# Patient Record
Sex: Male | Born: 1945 | Hispanic: No | Marital: Married | Smoking: Former smoker
Health system: Southern US, Community
[De-identification: ages and names within clinical notes are randomized; demographics above are authoritative.]

## PROBLEM LIST (undated history)

## (undated) DIAGNOSIS — I2089 Other forms of angina pectoris: Secondary | ICD-10-CM

## (undated) DIAGNOSIS — L821 Other seborrheic keratosis: Secondary | ICD-10-CM

## (undated) DIAGNOSIS — M869 Osteomyelitis, unspecified: Secondary | ICD-10-CM

## (undated) DIAGNOSIS — E11628 Type 2 diabetes mellitus with other skin complications: Secondary | ICD-10-CM

## (undated) DIAGNOSIS — G5793 Unspecified mononeuropathy of bilateral lower limbs: Secondary | ICD-10-CM

## (undated) DIAGNOSIS — R0981 Nasal congestion: Secondary | ICD-10-CM

## (undated) DIAGNOSIS — D125 Benign neoplasm of sigmoid colon: Secondary | ICD-10-CM

## (undated) DIAGNOSIS — G894 Chronic pain syndrome: Secondary | ICD-10-CM

## (undated) DIAGNOSIS — N529 Male erectile dysfunction, unspecified: Secondary | ICD-10-CM

## (undated) DIAGNOSIS — R0600 Dyspnea, unspecified: Secondary | ICD-10-CM

## (undated) DIAGNOSIS — M1990 Unspecified osteoarthritis, unspecified site: Secondary | ICD-10-CM

## (undated) DIAGNOSIS — E119 Type 2 diabetes mellitus without complications: Secondary | ICD-10-CM

## (undated) DIAGNOSIS — I6523 Occlusion and stenosis of bilateral carotid arteries: Secondary | ICD-10-CM

## (undated) DIAGNOSIS — L03119 Cellulitis of unspecified part of limb: Secondary | ICD-10-CM

## (undated) DIAGNOSIS — J449 Chronic obstructive pulmonary disease, unspecified: Secondary | ICD-10-CM

## (undated) DIAGNOSIS — Z89422 Acquired absence of other left toe(s): Secondary | ICD-10-CM

## (undated) DIAGNOSIS — Z972 Presence of dental prosthetic device (complete) (partial): Secondary | ICD-10-CM

## (undated) DIAGNOSIS — R519 Headache, unspecified: Secondary | ICD-10-CM

## (undated) DIAGNOSIS — R600 Localized edema: Secondary | ICD-10-CM

## (undated) DIAGNOSIS — M109 Gout, unspecified: Secondary | ICD-10-CM

## (undated) DIAGNOSIS — I429 Cardiomyopathy, unspecified: Secondary | ICD-10-CM

## (undated) DIAGNOSIS — I2510 Atherosclerotic heart disease of native coronary artery without angina pectoris: Secondary | ICD-10-CM

## (undated) DIAGNOSIS — K219 Gastro-esophageal reflux disease without esophagitis: Secondary | ICD-10-CM

## (undated) DIAGNOSIS — E785 Hyperlipidemia, unspecified: Secondary | ICD-10-CM

## (undated) DIAGNOSIS — I10 Essential (primary) hypertension: Secondary | ICD-10-CM

## (undated) DIAGNOSIS — R51 Headache: Secondary | ICD-10-CM

## (undated) DIAGNOSIS — R42 Dizziness and giddiness: Secondary | ICD-10-CM

## (undated) DIAGNOSIS — I214 Non-ST elevation (NSTEMI) myocardial infarction: Secondary | ICD-10-CM

## (undated) DIAGNOSIS — Z951 Presence of aortocoronary bypass graft: Secondary | ICD-10-CM

## (undated) DIAGNOSIS — I509 Heart failure, unspecified: Secondary | ICD-10-CM

## (undated) HISTORY — DX: Gout, unspecified: M10.9

## (undated) HISTORY — DX: Heart failure, unspecified: I50.9

## (undated) HISTORY — DX: Chronic pain syndrome: G89.4

## (undated) HISTORY — DX: Male erectile dysfunction, unspecified: N52.9

## (undated) HISTORY — DX: Type 2 diabetes mellitus without complications: E11.9

## (undated) HISTORY — DX: Hyperlipidemia, unspecified: E78.5

## (undated) HISTORY — PX: KNEE SURGERY: SHX244

## (undated) HISTORY — DX: Essential (primary) hypertension: I10

## (undated) HISTORY — DX: Other seborrheic keratosis: L82.1

## (undated) HISTORY — DX: Unspecified osteoarthritis, unspecified site: M19.90

## (undated) HISTORY — PX: CORONARY ARTERY BYPASS GRAFT: SHX141

---

## 1978-12-18 DIAGNOSIS — I509 Heart failure, unspecified: Secondary | ICD-10-CM

## 1978-12-18 HISTORY — DX: Heart failure, unspecified: I50.9

## 2004-09-22 ENCOUNTER — Ambulatory Visit: Payer: Self-pay | Admitting: Physician Assistant

## 2004-11-08 ENCOUNTER — Ambulatory Visit: Payer: Self-pay | Admitting: Physician Assistant

## 2004-11-25 ENCOUNTER — Ambulatory Visit: Payer: Self-pay | Admitting: Gastroenterology

## 2004-12-07 ENCOUNTER — Ambulatory Visit: Payer: Self-pay | Admitting: Physician Assistant

## 2007-12-10 ENCOUNTER — Ambulatory Visit: Payer: Self-pay | Admitting: Gastroenterology

## 2009-02-05 ENCOUNTER — Ambulatory Visit: Payer: Self-pay | Admitting: Cardiology

## 2009-02-05 ENCOUNTER — Ambulatory Visit: Payer: Self-pay | Admitting: General Practice

## 2009-02-18 ENCOUNTER — Ambulatory Visit: Payer: Self-pay | Admitting: General Practice

## 2009-12-21 ENCOUNTER — Ambulatory Visit: Payer: Self-pay | Admitting: General Practice

## 2009-12-24 ENCOUNTER — Ambulatory Visit: Payer: Self-pay | Admitting: General Practice

## 2010-10-27 ENCOUNTER — Ambulatory Visit: Payer: Self-pay | Admitting: Gastroenterology

## 2010-10-30 LAB — PATHOLOGY REPORT

## 2010-12-18 HISTORY — PX: ROTATOR CUFF REPAIR: SHX139

## 2011-02-07 ENCOUNTER — Ambulatory Visit: Payer: Self-pay | Admitting: Unknown Physician Specialty

## 2011-02-17 ENCOUNTER — Ambulatory Visit: Payer: Self-pay | Admitting: Unknown Physician Specialty

## 2013-08-07 ENCOUNTER — Ambulatory Visit: Payer: Self-pay | Admitting: Internal Medicine

## 2014-03-06 ENCOUNTER — Emergency Department: Payer: Self-pay | Admitting: Emergency Medicine

## 2014-03-06 LAB — CBC
HCT: 39.2 % — ABNORMAL LOW (ref 40.0–52.0)
HGB: 13.4 g/dL (ref 13.0–18.0)
MCH: 29.7 pg (ref 26.0–34.0)
MCHC: 34.1 g/dL (ref 32.0–36.0)
MCV: 87 fL (ref 80–100)
Platelet: 198 10*3/uL (ref 150–440)
RBC: 4.5 10*6/uL (ref 4.40–5.90)
RDW: 16.8 % — ABNORMAL HIGH (ref 11.5–14.5)
WBC: 6.2 10*3/uL (ref 3.8–10.6)

## 2014-03-06 LAB — TROPONIN I: Troponin-I: <0.02 ng/mL

## 2014-03-06 LAB — URINALYSIS, COMPLETE
Bacteria: NONE SEEN
Bilirubin,UR: NEGATIVE
Blood: NEGATIVE
Glucose,UR: NEGATIVE mg/dL (ref 0–75)
Ketone: NEGATIVE
Leukocyte Esterase: NEGATIVE
Nitrite: NEGATIVE
Ph: 6 (ref 4.5–8.0)
Protein: NEGATIVE
RBC,UR: NONE SEEN /HPF (ref 0–5)
Specific Gravity: 1.002 (ref 1.003–1.030)
Squamous Epithelial: NONE SEEN
WBC UR: NONE SEEN /HPF (ref 0–5)

## 2014-03-06 LAB — COMPREHENSIVE METABOLIC PANEL
Albumin: 3.8 g/dL (ref 3.4–5.0)
Alkaline Phosphatase: 79 U/L
Anion Gap: 2 — ABNORMAL LOW (ref 7–16)
BUN: 8 mg/dL (ref 7–18)
Bilirubin,Total: 0.4 mg/dL (ref 0.2–1.0)
Calcium, Total: 8.6 mg/dL (ref 8.5–10.1)
Chloride: 106 mmol/L (ref 98–107)
Co2: 29 mmol/L (ref 21–32)
Creatinine: 0.96 mg/dL (ref 0.60–1.30)
EGFR (African American): >60
EGFR (Non-African Amer.): >60
Glucose: 139 mg/dL — ABNORMAL HIGH (ref 65–99)
Osmolality: 274 (ref 275–301)
Potassium: 3.8 mmol/L (ref 3.5–5.1)
SGOT(AST): 21 U/L (ref 15–37)
SGPT (ALT): 26 U/L (ref 12–78)
Sodium: 137 mmol/L (ref 136–145)
Total Protein: 7.6 g/dL (ref 6.4–8.2)

## 2014-10-16 DIAGNOSIS — E119 Type 2 diabetes mellitus without complications: Secondary | ICD-10-CM | POA: Insufficient documentation

## 2014-10-16 DIAGNOSIS — I2510 Atherosclerotic heart disease of native coronary artery without angina pectoris: Secondary | ICD-10-CM | POA: Insufficient documentation

## 2014-10-16 DIAGNOSIS — E782 Mixed hyperlipidemia: Secondary | ICD-10-CM | POA: Insufficient documentation

## 2014-10-26 DIAGNOSIS — I6523 Occlusion and stenosis of bilateral carotid arteries: Secondary | ICD-10-CM | POA: Insufficient documentation

## 2014-10-26 DIAGNOSIS — I6529 Occlusion and stenosis of unspecified carotid artery: Secondary | ICD-10-CM | POA: Insufficient documentation

## 2015-02-02 DIAGNOSIS — R0681 Apnea, not elsewhere classified: Secondary | ICD-10-CM | POA: Insufficient documentation

## 2015-06-02 DIAGNOSIS — I10 Essential (primary) hypertension: Secondary | ICD-10-CM | POA: Insufficient documentation

## 2015-06-18 ENCOUNTER — Ambulatory Visit: Payer: Self-pay | Admitting: Urology

## 2015-07-02 ENCOUNTER — Encounter: Payer: Self-pay | Admitting: Urology

## 2015-07-02 ENCOUNTER — Ambulatory Visit (INDEPENDENT_AMBULATORY_CARE_PROVIDER_SITE_OTHER): Payer: Medicare HMO | Admitting: Urology

## 2015-07-02 VITALS — BP 128/64 | HR 74 | Ht 73.0 in | Wt 203.0 lb

## 2015-07-02 DIAGNOSIS — N529 Male erectile dysfunction, unspecified: Secondary | ICD-10-CM | POA: Diagnosis not present

## 2015-07-02 MED ORDER — ALPROSTADIL (VASODILATOR) 20 MCG IC KIT: 20.0000 ug | PACK | INTRACAVERNOUS | Status: DC | PRN

## 2015-07-02 NOTE — Progress Notes (Signed)
07/02/2015 11:32 AM   [place] 04/19/1946 [id number]  Referring provider: No referring provider defined for this encounter.  Chief Complaint  Patient presents with  . Erectile Dysfunction    New Patient    HPI: 69 yo M with longstanding ED who presents today for further evaluation.  He reports an approximately 10 year history of difficulty achieving and maintaining an erection adequate for penetration. He is able to ejaculate rarely in the absence of erection.  His interest in sexual activity is intact.  No history of penile curvature, penile pain, or penile trauma.  He was seen and evaluated for this approximately 3 years ago by ? [facility] ([place]/ [place]).  He tried all kinds of tabs (Viagra, Levitra, Cialis) but they were no longer effective.  He did have an injection in the office for injection which was effective but he was too anxious about needles to perform these injections at home.  He is unsure of the name of the medication and dose. He does think now that he is mentally prepared to resume with intracavernosal injection therapy.  He has DM, HTN, HL and a history of MI s/p cardiac .  He sees Dr. Drema Dallas (cardiology) and saw him just this week.   He is currently Imdur.    He reports that he gets annual digital rectal exams/PSA screening by his PCP.      SHIM      07/04/15 1124       SHIM: Over the last 6 months:   How do you rate your confidence that you could get and keep an erection? Very Low     When you had erections with sexual stimulation, how often were your erections hard enough for penetration (entering your partner)? Almost Never or Never     During sexual intercourse, how often were you able to maintain your erection after you had penetrated (entered) your partner? Extremely Difficult     During sexual intercourse, how difficult was it to maintain your erection to completion of intercourse? Extremely Difficult     When you attempted sexual  intercourse, how often was it satisfactory for you? Extremely Difficult     SHIM Total Score   SHIM 5         PMH: Past Medical History  Diagnosis Date  . ED (erectile dysfunction)   . Seborrheic keratosis   . Chronic pain syndrome   . Diabetes   . Gout   . Congestive heart failure   . Hyperlipemia   . Hypertension   . Arthritis     Surgical History: Past Surgical History  Procedure Laterality Date  . Rotator cuff repair Right 2012  . Knee surgery Right     over 20 years ago    Home Medications:    Medication List       This list is accurate as of: 07/02/15 11:59 PM.  Always use your most recent med list.               alprostadil 20 MCG injection  Commonly known as:  EDEX  20 mcg by Intracavitary route as needed for erectile dysfunction. use no more than 3 times per week     amLODipine 10 MG tablet  Commonly known as:  NORVASC  Take 10 mg by mouth daily.     aspirin 81 MG tablet  Take 81 mg by mouth daily.     atorvastatin 80 MG tablet  Commonly known as:  LIPITOR  Take 80 mg by mouth daily.     gabapentin 800 MG tablet  Commonly known as:  NEURONTIN  Take 1,600 mg by mouth 3 (three) times daily.     isosorbide dinitrate 30 MG tablet  Commonly known as:  ISORDIL  Take 30 mg by mouth 2 (two) times daily.     lisinopril 20 MG tablet  Commonly known as:  PRINIVIL,ZESTRIL  Take 20 mg by mouth daily.     metFORMIN 500 MG 24 hr tablet  Commonly known as:  GLUCOPHAGE-XR  500 mg 2 (two) times daily.     omeprazole 20 MG capsule  Commonly known as:  PRILOSEC  Take 20 mg by mouth daily.     oxyCODONE-acetaminophen 10-325 MG per tablet  Commonly known as:  PERCOCET  Take 1 tablet by mouth every 4 (four) hours as needed for pain.     pioglitazone 15 MG tablet  Commonly known as:  ACTOS  Take 15 mg by mouth daily.        Allergies:  Allergies  Allergen Reactions  . Cortisone Acetate [Cortisone] Rash    Cortisone injection    Family  History: Family History  Problem Relation Age of Onset  . Cancer Father   . Heart disease Father   . Cancer Mother   . Heart disease Brother   . Diabetes Brother   . Diabetes Sister     Social History:  reports that he quit smoking about 35 years ago. He does not have any smokeless tobacco history on file. He reports that he does not drink alcohol or use illicit drugs.   Review of Systems UROLOGY Frequent Urination?: No Hard to postpone urination?: No Burning/pain with urination?: No Get up at night to urinate?: Yes Leakage of urine?: No Urine stream starts and stops?: Yes Trouble starting stream?: No Do you have to strain to urinate?: No Blood in urine?: No Urinary tract infection?: No Sexually transmitted disease?: No Injury to kidneys or bladder?: No Painful intercourse?: No Weak stream?: No Erection problems?: No Penile pain?: No Gastrointestinal Nausea?: No Vomiting?: No Indigestion/heartburn?: No Diarrhea?: No Constipation?: No Constitutional Fever: No Night sweats?: No Weight loss?: No Fatigue?: No Skin Skin rash/lesions?: No Itching?: No Eyes Blurred vision?: No Double vision?: No Ears/Nose/Throat Sore throat?: No Sinus problems?: No Hematologic/Lymphatic Swollen glands?: No Easy bruising?: No Cardiovascular Leg swelling?: No Chest pain?: No Respiratory Cough?: No Shortness of breath?: No Endocrine Excessive thirst?: No Musculoskeletal Back pain?: No Joint pain?: No Neurological Headaches?: No Dizziness?: No Psychologic Depression?: No Anxiety?: No   Physical Exam: BP 128/64 mmHg  Pulse 74  Ht 6\' 1"  (1.854 m)  Wt 203 lb (92.08 kg)  BMI 26.79 kg/m2  Constitutional:  Alert and oriented, No acute distress. HEENT: [place] AT, moist mucus membranes.  Trachea midline, no masses. Cardiovascular: No clubbing, cyanosis, or edema. Respiratory: Normal respiratory effort, no increased work of breathing. GI: Abdomen is soft, nontender,  nondistended, no abdominal masses GU: No CVA tenderness. Circumcised without any evidence of penile plaques or abnormalities. Orthotopic patent meatus. Unremarkable scrotum with bilateral descended testicles, no masses. Skin: No rashes, bruises or suspicious lesions. Lymph: No cervical or inguinal adenopathy. Neurologic: Grossly intact, no focal deficits, moving all 4 extremities. Psychiatric: Normal mood and affect.  Laboratory Data: Lab Results  Component Value Date   WBC 6.2 03/06/2014   HGB 13.4 03/06/2014   HCT 39.2* 03/06/2014   MCV 87 03/06/2014   PLT 198 03/06/2014    Lab Results  Component Value Date   CREATININE 0.96 03/06/2014    Pertinent Imaging: n/a  Assessment & Plan:  69 year old male with long-standing, refractory erectile dysfunction, likely multifactorial in the setting of hypertension, hyperlipidemia, diabetes, and coronary artery disease. Given his Imdur, PD 5 inhibitors are contraindicated. He discussed today possibly proceeding with intracavernosal injections as previously and the patient is very interested in this. We discussed the risks of ICI including the risk of penile pain, scarring, and priapism. He is willing to proceed as planned.  1. Erectile dysfunction, unspecified erectile dysfunction type Caverject prescribed today, will fill prescription and bring to the follow-up appointment for testing doses.   Return in about 2 weeks (around 07/16/2015) for injection teaching (shannon or dr. Erlene Quan).  Hollice Espy, MD  [facility] [address], [place],  [address] [phone]

## 2015-07-02 NOTE — Patient Instructions (Signed)
Erectile Dysfunction Erectile dysfunction is the inability to get or sustain a good enough erection to have sexual intercourse. Erectile dysfunction may involve:  Inability to get an erection.  Lack of enough hardness to allow penetration.  Loss of the erection before sex is finished.  Premature ejaculation. CAUSES  Certain drugs, such as:  Pain relievers.  Antihistamines.  Antidepressants.  Blood pressure medicines.  Water pills (diuretics).  Ulcer medicines.  Muscle relaxants.  Illegal drugs.  Excessive drinking.  Psychological causes, such as:  Anxiety.  Depression.  Sadness.  Exhaustion.  Performance fear.  Stress.  Physical causes, such as:  Artery problems. This may include diabetes, smoking, liver disease, or atherosclerosis.  High blood pressure.  Hormonal problems, such as low testosterone.  Obesity.  Nerve problems. This may include back or pelvic injuries, diabetes mellitus, multiple sclerosis, or Parkinson disease. SYMPTOMS  Inability to get an erection.  Lack of enough hardness to allow penetration.  Loss of the erection before sex is finished.  Premature ejaculation.  Normal erections at some times, but with frequent unsatisfactory episodes.  Orgasms that are not satisfactory in sensation or frequency.  Low sexual satisfaction in either partner because of erection problems.  A curved penis occurring with erection. The curve may cause pain or may be too curved to allow for intercourse.  Never having nighttime erections. DIAGNOSIS Your caregiver can often diagnose this condition by:  Performing a physical exam to find other diseases or specific problems with the penis.  Asking you detailed questions about the problem.  Performing blood tests to check for diabetes mellitus or to measure hormone levels.  Performing urine tests to find other underlying health conditions.  Performing an ultrasound exam to check for  scarring.  Performing a test to check blood flow to the penis.  Doing a sleep study at home to measure nighttime erections. TREATMENT   You may be prescribed medicines by mouth.  You may be given medicine injections into the penis.  You may be prescribed a vacuum pump with a ring.  Penile implant surgery may be performed. You may receive:  An inflatable implant.  A semirigid implant.  Blood vessel surgery may be performed. HOME CARE INSTRUCTIONS  If you are prescribed oral medicine, you should take the medicine as prescribed. Do not increase the dosage without first discussing it with your physician.  If you are using self-injections, be careful to avoid any veins that are on the surface of the penis. Apply pressure to the injection site for 5 minutes.  If you are using a vacuum pump, make sure you have read the instructions before using it. Discuss any questions with your physician before taking the pump home. SEEK MEDICAL CARE IF:  You experience pain that is not responsive to the pain medicine you have been prescribed.  You experience nausea or vomiting. SEEK IMMEDIATE MEDICAL CARE IF:   When taking oral or injectable medications, you experience an erection that lasts longer than 4 hours. If your physician is unavailable, go to the nearest emergency room for evaluation. An erection that lasts much longer than 4 hours can result in permanent damage to your penis.  You have pain that is severe.  You develop redness, severe pain, or severe swelling of your penis.  You have redness spreading up into your groin or lower abdomen.  You are unable to pass your urine. Document Released: 12/01/2000 Document Revised: 08/06/2013 Document Reviewed: 05/08/2013 [facility] Patient Information 2015 [place], [place]. This information is not  intended to replace advice given to you by your health care provider. Make sure you discuss any questions you have with your health care provider.

## 2015-07-04 ENCOUNTER — Encounter: Payer: Self-pay | Admitting: Urology

## 2015-07-09 ENCOUNTER — Telehealth: Payer: Self-pay | Admitting: Urology

## 2015-07-09 NOTE — Telephone Encounter (Signed)
Mr. Belger called saying the Rx for Edex is too expensive for him to fill. [employer] told him they sent information to [facility] in regards to the medication, the cost, and his insurance but haven't received a response. He's wondering if someone at [facility] will call [employer] to discuss this so he can fill the Rx and have it mailed to him. Pt ph# [phone] Thank you.

## 2015-07-12 ENCOUNTER — Telehealth: Payer: Self-pay

## 2015-07-12 NOTE — Telephone Encounter (Signed)
Insurance will not cover edex. Pt is going to call insurance company to get a list of medications insurance company will cover.

## 2015-07-16 ENCOUNTER — Ambulatory Visit: Payer: Medicare HMO | Admitting: Urology

## 2015-07-20 ENCOUNTER — Encounter: Payer: Self-pay | Admitting: Urology

## 2015-07-20 ENCOUNTER — Ambulatory Visit (INDEPENDENT_AMBULATORY_CARE_PROVIDER_SITE_OTHER): Payer: Medicare HMO | Admitting: Urology

## 2015-07-20 VITALS — BP 132/69 | HR 93 | Ht 73.0 in | Wt 201.8 lb

## 2015-07-20 DIAGNOSIS — N529 Male erectile dysfunction, unspecified: Secondary | ICD-10-CM

## 2015-07-20 NOTE — Progress Notes (Signed)
Trimix - Papaverine 30mg  Phentolamine 0.5mg  Prostaglandin 10 mcg Proscription called in to [facility] for 5 syringes trimix

## 2015-07-25 NOTE — Progress Notes (Signed)
1:20 PM   [place] [birth year] [id number]  Referring provider: Ellamae Sia, MD [place], [place] [address]  Chief Complaint  Patient presents with  . Erectile Dysfunction    pt comes in for ED injection (Trimix) instruction    HPI: 70 yo M with longstanding ED presents today for test dose Trimix teaching and administration.    He reports an approximately 10 year history of difficulty achieving and maintaining an erection adequate for penetration. He is able to ejaculate rarely in the absence of erection.  His interest in sexual activity is intact.  No history of penile curvature, penile pain, or penile trauma.  He was seen and evaluated for this approximately 3 years ago by ? [facility] ([place]/ [place]).  He tried all kinds of tabs (Viagra, Levitra, Cialis) but they were no longer effective.  He did have an injection in the office for injection which was effective but he was too anxious about needles to perform these injections at home.  He is unsure of the name of the medication and dose. He does think now that he is mentally prepared to resume with intracavernosal injection therapy.  He has DM, HTN, HL and a history of MI s/p cardiac .  He sees Dr. Drema Dallas (cardiology) and saw him just this week.   He is currently Imdur.    He reports that he gets annual digital rectal exams/PSA screening by his PCP.      SHIM      07/04/15 1124       SHIM: Over the last 6 months:   How do you rate your confidence that you could get and keep an erection? Very Low     When you had erections with sexual stimulation, how often were your erections hard enough for penetration (entering your partner)? Almost Never or Never     During sexual intercourse, how often were you able to maintain your erection after you had penetrated (entered) your partner? Extremely Difficult     During sexual intercourse, how difficult was it to maintain your erection to completion of  intercourse? Extremely Difficult     When you attempted sexual intercourse, how often was it satisfactory for you? Extremely Difficult     SHIM Total Score   SHIM 5         PMH: Past Medical History  Diagnosis Date  . ED (erectile dysfunction)   . Seborrheic keratosis   . Chronic pain syndrome   . Diabetes   . Gout   . Congestive heart failure   . Hyperlipemia   . Hypertension   . Arthritis     Surgical History: Past Surgical History  Procedure Laterality Date  . Rotator cuff repair Right 2012  . Knee surgery Right     over 20 years ago    Home Medications:    Medication List       This list is accurate as of: 07/20/15 11:59 PM.  Always use your most recent med list.               alprostadil 20 MCG injection  Commonly known as:  EDEX  20 mcg by Intracavitary route as needed for erectile dysfunction. use no more than 3 times per week     amLODipine 10 MG tablet  Commonly known as:  NORVASC  Take 10 mg by mouth daily.     aspirin 81 MG tablet  Take 81 mg by mouth daily.  atorvastatin 80 MG tablet  Commonly known as:  LIPITOR  Take 80 mg by mouth daily.     gabapentin 800 MG tablet  Commonly known as:  NEURONTIN  Take 1,600 mg by mouth 3 (three) times daily.     isosorbide dinitrate 30 MG tablet  Commonly known as:  ISORDIL  Take 30 mg by mouth 2 (two) times daily.     lisinopril 20 MG tablet  Commonly known as:  PRINIVIL,ZESTRIL  Take 20 mg by mouth daily.     metFORMIN 500 MG 24 hr tablet  Commonly known as:  GLUCOPHAGE-XR  500 mg 2 (two) times daily.     omeprazole 20 MG capsule  Commonly known as:  PRILOSEC  Take 20 mg by mouth daily.     oxyCODONE-acetaminophen 10-325 MG per tablet  Commonly known as:  PERCOCET  Take 1 tablet by mouth every 4 (four) hours as needed for pain.     pioglitazone 15 MG tablet  Commonly known as:  ACTOS  Take 15 mg by mouth daily.        Allergies:  Allergies  Allergen Reactions  . Cortisone  Acetate [Cortisone] Rash    Cortisone injection    Family History: Family History  Problem Relation Age of Onset  . Cancer Father   . Heart disease Father   . Cancer Mother   . Heart disease Brother   . Diabetes Brother   . Diabetes Sister     Social History:  reports that he quit smoking about 35 years ago. He does not have any smokeless tobacco history on file. He reports that he does not drink alcohol or use illicit drugs.   Physical Exam: BP 132/69 mmHg  Pulse 93  Ht 6\' 1"  (1.854 m)  Wt 201 lb 12.8 oz (91.536 kg)  BMI 26.63 kg/m2  Constitutional:  Alert and oriented, No acute distress. HEENT: [place] AT, moist mucus membranes.  Trachea midline, no masses. Cardiovascular: No clubbing, cyanosis, or edema. Respiratory: Normal respiratory effort, no increased work of breathing. GI: Abdomen is soft, nontender, nondistended, no abdominal masses GU: No CVA tenderness. Circumcised without any evidence of penile plaques or abnormalities. Orthotopic patent meatus. Unremarkable scrotum with bilateral descended testicles, no masses. Skin: No rashes, bruises or suspicious lesions. Neurologic: Grossly intact, no focal deficits, moving all 4 extremities. Psychiatric: Normal mood and affect.  Laboratory Data: Lab Results  Component Value Date   WBC 6.2 03/06/2014   HGB 13.4 03/06/2014   HCT 39.2* 03/06/2014   MCV 87 03/06/2014   PLT 198 03/06/2014    Lab Results  Component Value Date   CREATININE 0.96 03/06/2014    Procedure: Phallus placed on stretch. Lateral aspect of right midshaft prepped with alcohol pad. Prefilled insulin needle of 0.5 mL of tri-mix consisting of the following formulation were administered intracavernosally.  Patient experienced a partial, proximally 60% erection within 5 minutes of administration.    Trimix -  Papaverine 30mg   Phentolamine 0.5mg   Prostaglandin 10 mcg     Assessment & Plan:  68 year old male with long-standing, refractory erectile  dysfunction, likely multifactorial in the setting of hypertension, hyperlipidemia, diabetes, and coronary artery disease. Given his Imdur, PD 5 inhibitors are contraindicated.   1. Erectile dysfunction, unspecified erectile dysfunction type Test dose of Trymex administered today with fairly good success. Suspect better success at home with stimulation. Steps to self injections were reviewed in detail. The patient feels comfortable with this.  He was given strict precautions to return  if his erection does not fully resolve within 4 hours or becomes painful. Additional refills called in to pharmacy today.  Return in about 6 months (around 01/20/2016) for ED f/u.  Hollice Espy, MD  [facility] [address] [place], [place] [address] [phone]

## 2015-09-20 ENCOUNTER — Telehealth: Payer: Self-pay

## 2015-09-20 ENCOUNTER — Other Ambulatory Visit: Payer: Self-pay

## 2015-09-20 NOTE — Telephone Encounter (Signed)
Gastroenterology Pre-Procedure Review  Request Date: 10/25/15 Requesting Physician: Dr. Ellin Goodie  PATIENT REVIEW QUESTIONS: The patient responded to the following health history questions as indicated:    1. Are you having any GI issues? no 2. Do you have a personal history of Polyps? yes (2011) 3. Do you have a family history of Colon Cancer or Polyps? no 4. Diabetes Mellitus? no 5. Joint replacements in the past 12 months?no 6. Major health problems in the past 3 months?no 7. Any artificial heart valves, MVP, or defibrillator?no    MEDICATIONS & ALLERGIES:    Patient reports the following regarding taking any anticoagulation/antiplatelet therapy:   Plavix, Coumadin, Eliquis, Xarelto, Lovenox, Pradaxa, Brilinta, or Effient? no Aspirin? yes (ASA 81mg )  Patient confirms/reports the following medications:  Current Outpatient Prescriptions  Medication Sig Dispense Refill  . alprostadil (EDEX) 20 MCG injection 20 mcg by Intracavitary route as needed for erectile dysfunction. use no more than 3 times per week (Patient not taking: Reported on 07/20/2015) 1 each 0  . amLODipine (NORVASC) 10 MG tablet Take 10 mg by mouth daily.    Marland Kitchen aspirin 81 MG tablet Take 81 mg by mouth daily.    Marland Kitchen atorvastatin (LIPITOR) 80 MG tablet Take 80 mg by mouth daily.    Marland Kitchen gabapentin (NEURONTIN) 800 MG tablet Take 1,600 mg by mouth 3 (three) times daily.    . isosorbide dinitrate (ISORDIL) 30 MG tablet Take 30 mg by mouth 2 (two) times daily.    Marland Kitchen lisinopril (PRINIVIL,ZESTRIL) 20 MG tablet Take 20 mg by mouth daily.    . metFORMIN (GLUCOPHAGE-XR) 500 MG 24 hr tablet 500 mg 2 (two) times daily.     Marland Kitchen omeprazole (PRILOSEC) 20 MG capsule Take 20 mg by mouth daily.    Marland Kitchen oxyCODONE-acetaminophen (PERCOCET) 10-325 MG per tablet Take 1 tablet by mouth every 4 (four) hours as needed for pain.    . pioglitazone (ACTOS) 15 MG tablet Take 15 mg by mouth daily.     No current facility-administered medications for this  visit.    Patient confirms/reports the following allergies:  Allergies  Allergen Reactions  . Cortisone Acetate [Cortisone] Rash    Cortisone injection    No orders of the defined types were placed in this encounter.    AUTHORIZATION INFORMATION Primary Insurance: 1D#: Group #:  Secondary Insurance: 1D#: Group #:  SCHEDULE INFORMATION: Date: 10/25/15 Time: Location: [place]

## 2015-09-22 ENCOUNTER — Telehealth: Payer: Self-pay

## 2015-09-22 NOTE — Telephone Encounter (Signed)
-----   Message from Kenneth Duarte sent at 06/07/2015  2:29 PM EDT ----- Regarding: triage Sent: 09/20/2015  call for triage. Pt due in Nov 2016. Last colon done by Dr. Candace Cruise on 10-27-2010. GF

## 2015-09-22 NOTE — Telephone Encounter (Signed)
Pt scheduled for colonoscopy on 10-25-15. Instructs/rx mailed.

## 2015-10-22 NOTE — Discharge Instructions (Signed)

## 2015-10-25 ENCOUNTER — Encounter
Admission: RE | Disposition: A | Discharge status: Home / Self Care | Payer: Self-pay | Source: Ambulatory Visit | Attending: Gastroenterology

## 2015-10-25 ENCOUNTER — Ambulatory Visit: Payer: Medicare HMO | Admitting: Anesthesiology

## 2015-10-25 ENCOUNTER — Other Ambulatory Visit: Payer: Self-pay | Admitting: Gastroenterology

## 2015-10-25 ENCOUNTER — Ambulatory Visit
Admission: RE | Admit: 2015-10-25 | Discharge: 2015-10-25 | Disposition: A | Discharge status: Home / Self Care | Payer: Medicare HMO | Source: Ambulatory Visit | Attending: Gastroenterology | Admitting: Gastroenterology

## 2015-10-25 DIAGNOSIS — I509 Heart failure, unspecified: Secondary | ICD-10-CM | POA: Insufficient documentation

## 2015-10-25 DIAGNOSIS — Z79899 Other long term (current) drug therapy: Secondary | ICD-10-CM | POA: Diagnosis not present

## 2015-10-25 DIAGNOSIS — L821 Other seborrheic keratosis: Secondary | ICD-10-CM | POA: Insufficient documentation

## 2015-10-25 DIAGNOSIS — E1142 Type 2 diabetes mellitus with diabetic polyneuropathy: Secondary | ICD-10-CM | POA: Insufficient documentation

## 2015-10-25 DIAGNOSIS — Z7984 Long term (current) use of oral hypoglycemic drugs: Secondary | ICD-10-CM | POA: Insufficient documentation

## 2015-10-25 DIAGNOSIS — M109 Gout, unspecified: Secondary | ICD-10-CM | POA: Insufficient documentation

## 2015-10-25 DIAGNOSIS — I10 Essential (primary) hypertension: Secondary | ICD-10-CM | POA: Diagnosis not present

## 2015-10-25 DIAGNOSIS — Z860100 Personal history of colon polyps, unspecified: Secondary | ICD-10-CM | POA: Insufficient documentation

## 2015-10-25 DIAGNOSIS — E785 Hyperlipidemia, unspecified: Secondary | ICD-10-CM | POA: Diagnosis not present

## 2015-10-25 DIAGNOSIS — Z87891 Personal history of nicotine dependence: Secondary | ICD-10-CM | POA: Insufficient documentation

## 2015-10-25 DIAGNOSIS — Z7982 Long term (current) use of aspirin: Secondary | ICD-10-CM | POA: Insufficient documentation

## 2015-10-25 DIAGNOSIS — M1990 Unspecified osteoarthritis, unspecified site: Secondary | ICD-10-CM | POA: Diagnosis not present

## 2015-10-25 DIAGNOSIS — K641 Second degree hemorrhoids: Secondary | ICD-10-CM | POA: Diagnosis not present

## 2015-10-25 DIAGNOSIS — G894 Chronic pain syndrome: Secondary | ICD-10-CM | POA: Diagnosis not present

## 2015-10-25 DIAGNOSIS — D125 Benign neoplasm of sigmoid colon: Secondary | ICD-10-CM

## 2015-10-25 HISTORY — PX: COLONOSCOPY WITH PROPOFOL: SHX5780

## 2015-10-25 HISTORY — DX: Nasal congestion: R09.81

## 2015-10-25 HISTORY — PX: POLYPECTOMY: SHX5525

## 2015-10-25 HISTORY — DX: Unspecified mononeuropathy of bilateral lower limbs: G57.93

## 2015-10-25 HISTORY — DX: Presence of dental prosthetic device (complete) (partial): Z97.2

## 2015-10-25 LAB — GLUCOSE, CAPILLARY
Glucose-Capillary: 121 mg/dL — ABNORMAL HIGH (ref 65–99)
Glucose-Capillary: 123 mg/dL — ABNORMAL HIGH (ref 65–99)

## 2015-10-25 SURGERY — COLONOSCOPY WITH PROPOFOL
Anesthesia: Monitor Anesthesia Care | Wound class: Contaminated

## 2015-10-25 MED ORDER — LIDOCAINE HCL (CARDIAC) 20 MG/ML IV SOLN
INTRAVENOUS | Status: DC | PRN
  Administered 2015-10-25: 50 mg via INTRAVENOUS

## 2015-10-25 MED ORDER — LACTATED RINGERS IV SOLN
INTRAVENOUS | Status: DC
  Administered 2015-10-25: 08:00:00 via INTRAVENOUS

## 2015-10-25 MED ORDER — PROPOFOL 10 MG/ML IV BOLUS
INTRAVENOUS | Status: DC | PRN
  Administered 2015-10-25 (×3): 30 mg via INTRAVENOUS
  Administered 2015-10-25: 140 mg via INTRAVENOUS
  Administered 2015-10-25: 30 mg via INTRAVENOUS

## 2015-10-25 MED ORDER — SODIUM CHLORIDE 0.9 % IV SOLN: INTRAVENOUS | Status: DC

## 2015-10-25 MED ORDER — STERILE WATER FOR IRRIGATION IR SOLN
Status: DC | PRN
  Administered 2015-10-25: 08:00:00

## 2015-10-25 SURGICAL SUPPLY — 28 items
CANISTER SUCT 1200ML W/VALVE (MISCELLANEOUS) ×4 IMPLANT
FCP ESCP3.2XJMB 240X2.8X (MISCELLANEOUS)
FORCEPS BIOP RAD 4 LRG CAP 4 (CUTTING FORCEPS) IMPLANT
FORCEPS BIOP RJ4 240 W/NDL (MISCELLANEOUS)
FORCEPS ESCP3.2XJMB 240X2.8X (MISCELLANEOUS) IMPLANT
GOWN CVR UNV OPN BCK APRN NK (MISCELLANEOUS) ×4 IMPLANT
GOWN ISOL THUMB LOOP REG UNIV (MISCELLANEOUS) ×4
HEMOCLIP INSTINCT (CLIP) IMPLANT
INJECTOR VARIJECT VIN23 (MISCELLANEOUS) IMPLANT
KIT CO2 TUBING (TUBING) IMPLANT
KIT DEFENDO VALVE AND CONN (KITS) IMPLANT
KIT ENDO PROCEDURE OLY (KITS) ×4 IMPLANT
LIGATOR MULTIBAND 6SHOOTER MBL (MISCELLANEOUS) IMPLANT
MARKER SPOT ENDO TATTOO 5ML (MISCELLANEOUS) IMPLANT
PAD GROUND ADULT SPLIT (MISCELLANEOUS) IMPLANT
SNARE SHORT THROW 13M SML OVAL (MISCELLANEOUS) ×4 IMPLANT
SNARE SHORT THROW 30M LRG OVAL (MISCELLANEOUS) IMPLANT
SPOT EX ENDOSCOPIC TATTOO (MISCELLANEOUS)
SUCTION POLY TRAP 4CHAMBER (MISCELLANEOUS) IMPLANT
TRAP SUCTION POLY (MISCELLANEOUS) ×4 IMPLANT
TUBING CONN 6MMX3.1M (TUBING)
TUBING SUCTION CONN 0.25 STRL (TUBING) IMPLANT
UNDERPAD 30X60 958B10 (PK) (MISCELLANEOUS) IMPLANT
VALVE BIOPSY ENDO (VALVE) IMPLANT
VARIJECT INJECTOR VIN23 (MISCELLANEOUS)
WATER AUXILLARY (MISCELLANEOUS) IMPLANT
WATER STERILE IRR 250ML POUR (IV SOLUTION) ×4 IMPLANT
WATER STERILE IRR 500ML POUR (IV SOLUTION) IMPLANT

## 2015-10-25 NOTE — Anesthesia Preprocedure Evaluation (Signed)
Anesthesia Evaluation  Patient identified by MRN, date of birth, ID band Patient awake    Airway Mallampati: II  TM Distance: >3 FB Neck ROM: Full    Dental  (+) Upper Dentures, Lower Dentures   Pulmonary former smoker,           Cardiovascular hypertension, Pt. on medications      Neuro/Psych    GI/Hepatic GERD  Medicated and Controlled,  Endo/Other  diabetes, Well Controlled, Type 2  Renal/GU      Musculoskeletal   Abdominal   Peds  Hematology   Anesthesia Other Findings   Reproductive/Obstetrics                             Anesthesia Physical Anesthesia Plan  ASA: II  Anesthesia Plan: MAC   Post-op Pain Management:    Induction: Intravenous  Airway Management Planned:   Additional Equipment:   Intra-op Plan:   Post-operative Plan:   Informed Consent: I have reviewed the patients History and Physical, chart, labs and discussed the procedure including the risks, benefits and alternatives for the proposed anesthesia with the patient or authorized representative who has indicated his/her understanding and acceptance.     Plan Discussed with: CRNA  Anesthesia Plan Comments:         Anesthesia Quick Evaluation

## 2015-10-25 NOTE — Op Note (Signed)
[facility]astroenterology Patient Name: Kenneth Duarte Procedure Date: 10/25/2015 7:41 AM MRN: [id number] Account #: [id number] Date of Birth: 08-28-1946 Admit Type: Outpatient Age: 69 Room: [facility] OR ROOM 01 Gender: Male Note Status: Finalized Procedure:         Colonoscopy Indications:       High risk colon cancer surveillance: Personal history of                     colonic polyps Providers:         Lucilla Lame, MD Referring MD:      Ellin Goodie, MD (Referring MD) Medicines:         Propofol per Anesthesia Complications:     No immediate complications. Procedure:         Pre-Anesthesia Assessment:                    - Prior to the procedure, a History and Physical was                     performed, and patient medications and allergies were                     reviewed. The patient's tolerance of previous anesthesia                     was also reviewed. The risks and benefits of the procedure                     and the sedation options and risks were discussed with the                     patient. All questions were answered, and informed consent                     was obtained. Prior Anticoagulants: The patient has taken                     no previous anticoagulant or antiplatelet agents. ASA                     Grade Assessment: II - A patient with mild systemic                     disease. After reviewing the risks and benefits, the                     patient was deemed in satisfactory condition to undergo                     the procedure.                    After obtaining informed consent, the colonoscope was                     passed under direct vision. Throughout the procedure, the                     patient's blood pressure, pulse, and oxygen saturations                     were monitored continuously. The was introduced through  the anus and advanced to the the cecum, identified by                     appendiceal orifice  and ileocecal valve. The colonoscopy                     was performed without difficulty. The patient tolerated                     the procedure well. The quality of the bowel preparation                     was excellent. Findings:      The perianal and digital rectal examinations were normal.      A 7 mm polyp was found in the sigmoid colon. The polyp was sessile. The       polyp was removed with a cold snare. Resection and retrieval were       complete.      Non-bleeding internal hemorrhoids were found during retroflexion. The       hemorrhoids were Grade II (internal hemorrhoids that prolapse but reduce       spontaneously). Impression:        - One 7 mm polyp in the sigmoid colon. Resected and                     retrieved.                    - Non-bleeding internal hemorrhoids. Recommendation:    - Await pathology results.                    - Repeat colonoscopy in 5 years for surveillance. Procedure Code(s): --- Professional ---                    [phone], Colonoscopy, flexible; with removal of tumor(s),                     polyp(s), or other lesion(s) by snare technique Diagnosis Code(s): --- Professional ---                    Z86.010, Personal history of colonic polyps                    D12.5, Benign neoplasm of sigmoid colon CPT copyright 2014 American Medical Association. All rights reserved. The codes documented in this report are preliminary and upon coder review may  be revised to meet current compliance requirements. Lucilla Lame, MD 10/25/2015 8:09:33 AM This report has been signed electronically. Number of Addenda: 0 Note Initiated On: 10/25/2015 7:41 AM Scope Withdrawal Time: 0 hours 13 minutes 1 second  Total Procedure Duration: 0 hours 16 minutes 25 seconds       [facility]

## 2015-10-25 NOTE — Anesthesia Procedure Notes (Signed)
Procedure Name: MAC Performed by: Ayako Tapanes Pre-anesthesia Checklist: Patient identified, Emergency Drugs available, Suction available, Timeout performed and Patient being monitored Patient Re-evaluated:Patient Re-evaluated prior to inductionOxygen Delivery Method: Nasal cannula Placement Confirmation: positive ETCO2     

## 2015-10-25 NOTE — Transfer of Care (Signed)
Immediate Anesthesia Transfer of Care Note  Patient: Kenneth Duarte  Procedure(s) Performed: Procedure(s) with comments: COLONOSCOPY WITH PROPOFOL (N/A) - DIABETIC-ORAL MEDS POLYPECTOMY  Patient Location: PACU  Anesthesia Type: MAC  Level of Consciousness: awake, alert  and patient cooperative  Airway and Oxygen Therapy: Patient Spontanous Breathing and Patient connected to supplemental oxygen  Post-op Assessment: Post-op Vital signs reviewed, Patient's Cardiovascular Status Stable, Respiratory Function Stable, Patent Airway and No signs of Nausea or vomiting  Post-op Vital Signs: Reviewed and stable  Complications: No apparent anesthesia complications

## 2015-10-25 NOTE — Anesthesia Postprocedure Evaluation (Signed)
  Anesthesia Post-op Note  Patient: Kenneth Duarte  Procedure(s) Performed: Procedure(s) with comments: COLONOSCOPY WITH PROPOFOL (N/A) - DIABETIC-ORAL MEDS POLYPECTOMY  Anesthesia type:MAC  Patient location: PACU  Post pain: Pain level controlled  Post assessment: Post-op Vital signs reviewed, Patient's Cardiovascular Status Stable, Respiratory Function Stable, Patent Airway and No signs of Nausea or vomiting  Post vital signs: Reviewed and stable  Last Vitals:  Filed Vitals:   10/25/15 0814  BP: 96/61  Pulse: 90  Temp:   Resp: 20    Level of consciousness: awake, alert  and patient cooperative  Complications: No apparent anesthesia complications

## 2015-10-25 NOTE — H&P (Signed)
[facility]  [address], [place], [place] [address] Phone: [phone] Fax : [phone]  Primary Care Physician:  Ellamae Sia, MD Primary Gastroenterologist:  Dr. Allen Norris  Pre-Procedure History & Physical: HPI:  Kenneth Duarte is a 69 y.o. male is here for an colonoscopy.   Past Medical History  Diagnosis Date  . ED (erectile dysfunction)   . Seborrheic keratosis   . Chronic pain syndrome   . Gout   . Hyperlipemia   . Arthritis   . Hypertension     CONTROLLED ON MEDS  . Congestive heart failure ([place]) 1980  . Sinus congestion   . Neuropathy of both feet ([place])   . Diabetes ([place])     DIET  . Wears dentures     PARTIAL UPPER    Past Surgical History  Procedure Laterality Date  . Rotator cuff repair Right 2012  . Knee surgery Right     over 20 years ago    Prior to Admission medications   Medication Sig Start Date End Date Taking? Authorizing Provider  amLODipine (NORVASC) 10 MG tablet Take 10 mg by mouth daily. AM   Yes Historical Provider, MD  aspirin 81 MG tablet Take 81 mg by mouth daily. AM   Yes Historical Provider, MD  atorvastatin (LIPITOR) 80 MG tablet Take 80 mg by mouth daily. AM   Yes Historical Provider, MD  gabapentin (NEURONTIN) 800 MG tablet Take 1,600 mg by mouth 3 (three) times daily.   Yes Historical Provider, MD  isosorbide dinitrate (ISORDIL) 30 MG tablet Take 30 mg by mouth 2 (two) times daily.   Yes Historical Provider, MD  lisinopril (PRINIVIL,ZESTRIL) 20 MG tablet Take 20 mg by mouth daily. AFTERNOON   Yes Historical Provider, MD  metFORMIN (GLUCOPHAGE-XR) 500 MG 24 hr tablet 500 mg 2 (two) times daily.  05/31/15  Yes Historical Provider, MD  oxyCODONE-acetaminophen (PERCOCET) 10-325 MG per tablet Take 2 tablets by mouth every 4 (four) hours as needed for pain.    Yes Historical Provider, MD  pioglitazone (ACTOS) 15 MG tablet Take 15 mg by mouth daily. PM   Yes Historical Provider, MD  alprostadil (EDEX) 20 MCG  injection 20 mcg by Intracavitary route as needed for erectile dysfunction. use no more than 3 times per week Patient not taking: Reported on 07/20/2015 07/02/15   Hollice Espy, MD  omeprazole (PRILOSEC) 20 MG capsule Take 20 mg by mouth daily.    Historical Provider, MD    Allergies as of 09/20/2015 - Review Complete 09/20/2015  Allergen Reaction Noted  . Cortisone acetate [cortisone] Rash 06/30/2015    Family History  Problem Relation Age of Onset  . Cancer Father   . Heart disease Father   . Cancer Mother   . Heart disease Brother   . Diabetes Brother   . Diabetes Sister     Social History   Social History  . Marital Status: Married    Spouse Name: N/A  . Number of Children: N/A  . Years of Education: N/A   Occupational History  . Not on file.   Social History Main Topics  . Smoking status: Former Smoker -- 2.00 packs/day for 15 years    Types: Cigarettes    Quit date: 06/29/1980  . Smokeless tobacco: Never Used  . Alcohol Use: No     Comment: QUIT IN 1980  . Drug Use: No  . Sexual Activity: Not on file   Other Topics Concern  . Not on  file   Social History Narrative    Review of Systems: See HPI, otherwise negative ROS  Physical Exam: BP 151/75 mmHg  Pulse 87  Temp(Src) 97.9 F (36.6 C)  Resp 16  Ht 5\' 11"  (1.803 m)  Wt 188 lb (85.276 kg)  BMI 26.23 kg/m2  SpO2 98% General:   Alert,  pleasant and cooperative in NAD Head:  Normocephalic and atraumatic. Neck:  Supple; no masses or thyromegaly. Lungs:  Clear throughout to auscultation.    Heart:  Regular rate and rhythm. Abdomen:  Soft, nontender and nondistended. Normal bowel sounds, without guarding, and without rebound.   Neurologic:  Alert and  oriented x4;  grossly normal neurologically.  Impression/Plan: Kenneth Duarte is here for an colonoscopy to be performed for history of colon polyps  Risks, benefits, limitations, and alternatives regarding  colonoscopy have been reviewed with the  patient.  Questions have been answered.  All parties agreeable.   Ollen Bowl, MD  10/25/2015, 7:24 AM

## 2015-10-26 ENCOUNTER — Encounter: Payer: Self-pay | Admitting: Gastroenterology

## 2016-01-21 ENCOUNTER — Encounter: Payer: Self-pay | Admitting: Urology

## 2016-01-21 ENCOUNTER — Ambulatory Visit (INDEPENDENT_AMBULATORY_CARE_PROVIDER_SITE_OTHER): Payer: Medicare HMO | Admitting: Urology

## 2016-01-21 VITALS — BP 146/69 | HR 85 | Ht 71.0 in | Wt 206.8 lb

## 2016-01-21 DIAGNOSIS — N529 Male erectile dysfunction, unspecified: Secondary | ICD-10-CM

## 2016-01-21 NOTE — Progress Notes (Signed)
3:44 PM  01/22/2016   [place] 1946/05/29 [id number]  Referring provider: Ellamae Sia, MD [place], [place] [address]  Chief Complaint  Patient presents with  . Erectile Dysfunction    35month    HPI: 70yo M with longstanding ED on Trimix.  1- ED   He reports an approximately 10 year history of difficulty achieving and maintaining an erection adequate for penetration. He is able to ejaculate rarely in the absence of erection.  His interest in sexual activity is intact.  No history of penile curvature, penile pain, or penile trauma.    He has DM, HTN, HL and a history of MI s/p cardiac .  He sees Dr. Drema Dallas (cardiology) and saw him just this week.   He is currently Imdur.    Prescribed Trimix 07/2016 using 0.5 mL since last visit.  His erections are fairly decent (60% rigid) and good enough for penetration but could be better.  He would also prefer that they last a little longer.  No associated penile pain or curvature.      2- Prostate cancer screening He reports that he gets annual digital rectal exams/PSA screening by his PCP.      SHIM      01/21/16 0842       SHIM: Over the last 6 months:   How do you rate your confidence that you could get and keep an erection? Low     When you had erections with sexual stimulation, how often were your erections hard enough for penetration (entering your partner)? A Few Times (much less than half the time)     During sexual intercourse, how often were you able to maintain your erection after you had penetrated (entered) your partner? Very Difficult     During sexual intercourse, how difficult was it to maintain your erection to completion of intercourse? Difficult     When you attempted sexual intercourse, how often was it satisfactory for you? Difficult     SHIM Total Score   SHIM 12         PMH: Past Medical History  Diagnosis Date  . ED (erectile dysfunction)   . Seborrheic keratosis   .  Chronic pain syndrome   . Gout   . Hyperlipemia   . Arthritis   . Hypertension     CONTROLLED ON MEDS  . Congestive heart failure ([place]) 1980  . Sinus congestion   . Neuropathy of both feet ([place])   . Diabetes ([place])     DIET  . Wears dentures     PARTIAL UPPER    Surgical History: Past Surgical History  Procedure Laterality Date  . Rotator cuff repair Right 2012  . Knee surgery Right     over 20 years ago  . Colonoscopy with propofol N/A 10/25/2015    Procedure: COLONOSCOPY WITH PROPOFOL;  Surgeon: Lucilla Lame, MD;  Location: [facility];  Service: Endoscopy;  Laterality: N/A;  DIABETIC-ORAL MEDS  . Polypectomy  10/25/2015    Procedure: POLYPECTOMY;  Surgeon: Lucilla Lame, MD;  Location: [facility];  Service: Endoscopy;;    Home Medications:    Medication List       This list is accurate as of: 01/21/16 11:59 PM.  Always use your most recent med list.               alprostadil 20 MCG injection  Commonly known as:  EDEX  20 mcg by Intracavitary route  as needed for erectile dysfunction. use no more than 3 times per week     amLODipine 10 MG tablet  Commonly known as:  NORVASC  Take 10 mg by mouth daily. AM     aspirin 81 MG tablet  Take 81 mg by mouth daily. AM     atorvastatin 80 MG tablet  Commonly known as:  LIPITOR  Take 80 mg by mouth daily. AM     gabapentin 800 MG tablet  Commonly known as:  NEURONTIN  Take 1,600 mg by mouth 3 (three) times daily.     isosorbide mononitrate 60 MG 24 hr tablet  Commonly known as:  IMDUR     lisinopril 20 MG tablet  Commonly known as:  PRINIVIL,ZESTRIL  Take 20 mg by mouth daily. AFTERNOON     metFORMIN 500 MG 24 hr tablet  Commonly known as:  GLUCOPHAGE-XR  500 mg 2 (two) times daily.     omeprazole 20 MG capsule  Commonly known as:  PRILOSEC  Take 20 mg by mouth daily.     oxyCODONE-acetaminophen 10-325 MG tablet  Commonly known as:  PERCOCET  Take 2 tablets by mouth every 4 (four) hours as  needed for pain.     pioglitazone 15 MG tablet  Commonly known as:  ACTOS  Take 15 mg by mouth daily. PM        Allergies:  Allergies  Allergen Reactions  . Cortisone Acetate [Cortisone] Rash    Cortisone injection    Family History: Family History  Problem Relation Age of Onset  . Cancer Father   . Heart disease Father   . Cancer Mother   . Heart disease Brother   . Diabetes Brother   . Diabetes Sister     Social History:  reports that he quit smoking about 35 years ago. His smoking use included Cigarettes. He has a 30 pack-year smoking history. He has never used smokeless tobacco. He reports that he does not drink alcohol or use illicit drugs.   Physical Exam: BP 146/69 mmHg  Pulse 85  Ht 5\' 11"  (1.803 m)  Wt 206 lb 12.8 oz (93.804 kg)  BMI 28.86 kg/m2  Constitutional:  Alert and oriented, No acute distress. HEENT: [place] AT, moist mucus membranes.  Trachea midline, no masses. Cardiovascular: No clubbing, cyanosis, or edema. Respiratory: Normal respiratory effort, no increased work of breathing. Skin: No rashes, bruises or suspicious lesions.   Neurologic: Grossly intact, no focal deficits, moving all 4 extremities. Psychiatric: Normal mood and affect.  Laboratory Data: Lab Results  Component Value Date   WBC 6.2 03/06/2014   HGB 13.4 03/06/2014   HCT 39.2* 03/06/2014   MCV 87 03/06/2014   PLT 198 03/06/2014    Lab Results  Component Value Date   CREATININE 0.96 03/06/2014    Assessment & Plan:    1. Erectile dysfunction, unspecified erectile dysfunction type Doing well on trimix but only ~60% erect with medication Increase dose to 0.75 mL New dose called into compounding pharmacy.    Return in about 1 year (around 01/20/2017) for recheck ED.  Hollice Espy, MD  [facility] [address] [place], [place] [address] [phone]

## 2016-08-22 DIAGNOSIS — K219 Gastro-esophageal reflux disease without esophagitis: Secondary | ICD-10-CM | POA: Insufficient documentation

## 2016-12-18 DIAGNOSIS — Z22322 Carrier or suspected carrier of Methicillin resistant Staphylococcus aureus: Secondary | ICD-10-CM

## 2016-12-18 HISTORY — DX: Carrier or suspected carrier of methicillin resistant Staphylococcus aureus: Z22.322

## 2017-01-24 ENCOUNTER — Encounter: Payer: Self-pay | Admitting: Urology

## 2017-01-24 ENCOUNTER — Ambulatory Visit: Payer: Medicare PPO | Admitting: Urology

## 2017-01-24 ENCOUNTER — Telehealth: Payer: Self-pay | Admitting: Urology

## 2017-01-24 VITALS — BP 144/75 | HR 94 | Ht 68.0 in | Wt 199.7 lb

## 2017-01-24 DIAGNOSIS — N529 Male erectile dysfunction, unspecified: Secondary | ICD-10-CM | POA: Diagnosis not present

## 2017-01-24 NOTE — Telephone Encounter (Signed)
Would you call in a refill for [facility]?

## 2017-01-24 NOTE — Progress Notes (Signed)
01/24/2017 2:13 PM   Kenneth Duarte 12/28/1945 [id number]  Referring provider: Ellamae Sia, MD [address] [place], [place] [address]  Chief Complaint  Patient presents with  . Erectile Dysfunction    1 year follow up     HPI: Patient is a 71 year old Caucasian male with erectile dysfunction who presents today for a yearly follow up.  His SHIM score is 7, which is severe.   His previous SHIM score was 12.  He has been having difficulty with erections for over ten years.   His major complaint is achieving.  His libido is preserved.   His risk factors for ED are age, BPH, HTN, CAD, DM and blood pressure medications.  He denies any painful erections or curvatures with his erections.   He is currently using Trimix injections.  He is satisfied with the Trimix.       SHIM    Row Name 01/24/17 1345         SHIM: Over the last 6 months:   How do you rate your confidence that you could get and keep an erection? Very Low     When you had erections with sexual stimulation, how often were your erections hard enough for penetration (entering your partner)? Almost Never or Never     During sexual intercourse, how often were you able to maintain your erection after you had penetrated (entered) your partner? Very Difficult     During sexual intercourse, how difficult was it to maintain your erection to completion of intercourse? Very Difficult     When you attempted sexual intercourse, how often was it satisfactory for you? Extremely Difficult       SHIM Total Score   SHIM 7        Score: 1-7 Severe ED 8-11 Moderate ED 12-16 Mild-Moderate ED 17-21 Mild ED 22-25 No ED  PMH: Past Medical History:  Diagnosis Date  . Arthritis   . Chronic pain syndrome   . Congestive heart failure ([place]) 1980  . Diabetes ([place])    DIET  . ED (erectile dysfunction)   . Gout   . Hyperlipemia   . Hypertension    CONTROLLED ON MEDS  . Neuropathy of both feet   . Seborrheic  keratosis   . Sinus congestion   . Wears dentures    PARTIAL UPPER    Surgical History: Past Surgical History:  Procedure Laterality Date  . COLONOSCOPY WITH PROPOFOL N/A 10/25/2015   Procedure: COLONOSCOPY WITH PROPOFOL;  Surgeon: Lucilla Lame, MD;  Location: [facility];  Service: Endoscopy;  Laterality: N/A;  DIABETIC-ORAL MEDS  . KNEE SURGERY Right    over 20 years ago  . POLYPECTOMY  10/25/2015   Procedure: POLYPECTOMY;  Surgeon: Lucilla Lame, MD;  Location: [facility];  Service: Endoscopy;;  . ROTATOR CUFF REPAIR Right 2012    Home Medications:  Allergies as of 01/24/2017      Reactions   Cortisone Acetate [cortisone] Rash   Cortisone injection      Medication List       Accurate as of 01/24/17  2:13 PM. Always use your most recent med list.          alprostadil 20 MCG injection Commonly known as:  EDEX 20 mcg by Intracavitary route as needed for erectile dysfunction. use no more than 3 times per week   amLODipine 10 MG tablet Commonly known as:  NORVASC Take 10 mg by mouth daily.  AM   aspirin 81 MG tablet Take 81 mg by mouth daily. AM   atorvastatin 80 MG tablet Commonly known as:  LIPITOR Take 80 mg by mouth daily. AM   gabapentin 800 MG tablet Commonly known as:  NEURONTIN Take 1,600 mg by mouth 3 (three) times daily.   isosorbide mononitrate 60 MG 24 hr tablet Commonly known as:  IMDUR   lisinopril 20 MG tablet Commonly known as:  PRINIVIL,ZESTRIL Take 20 mg by mouth daily. AFTERNOON   metFORMIN 500 MG 24 hr tablet Commonly known as:  GLUCOPHAGE-XR 500 mg 2 (two) times daily.   omeprazole 20 MG capsule Commonly known as:  PRILOSEC Take 20 mg by mouth daily.   oxyCODONE-acetaminophen 10-325 MG tablet Commonly known as:  PERCOCET Take 2 tablets by mouth every 4 (four) hours as needed for pain.   pioglitazone 15 MG tablet Commonly known as:  ACTOS Take 15 mg by mouth daily. PM   UNKNOWN TO PATIENT 2 inhalations 2 (two) times  daily.       Allergies:  Allergies  Allergen Reactions  . Cortisone Acetate [Cortisone] Rash    Cortisone injection    Family History: Family History  Problem Relation Age of Onset  . Cancer Father   . Heart disease Father   . Cancer Mother   . Heart disease Brother   . Diabetes Brother   . Prostate cancer Brother   . Diabetes Sister   . Bladder Cancer Neg Hx   . Kidney disease Neg Hx     Social History:  reports that he quit smoking about 36 years ago. His smoking use included Cigarettes. He has a 30.00 pack-year smoking history. He has never used smokeless tobacco. He reports that he does not drink alcohol or use drugs.  ROS: UROLOGY Frequent Urination?: No Hard to postpone urination?: No Burning/pain with urination?: No Get up at night to urinate?: No Leakage of urine?: No Urine stream starts and stops?: No Trouble starting stream?: No Do you have to strain to urinate?: No Blood in urine?: No Urinary tract infection?: No Sexually transmitted disease?: No Injury to kidneys or bladder?: No Painful intercourse?: No Weak stream?: No Erection problems?: Yes Penile pain?: No  Gastrointestinal Nausea?: No Vomiting?: No Indigestion/heartburn?: No Diarrhea?: No Constipation?: No  Constitutional Fever: No Night sweats?: No Weight loss?: No Fatigue?: No  Skin Skin rash/lesions?: No Itching?: No  Eyes Blurred vision?: No Double vision?: No  Ears/Nose/Throat Sore throat?: No Sinus problems?: No  Hematologic/Lymphatic Swollen glands?: No Easy bruising?: No  Cardiovascular Leg swelling?: No Chest pain?: No  Respiratory Cough?: No Shortness of breath?: No  Endocrine Excessive thirst?: No  Musculoskeletal Back pain?: No Joint pain?: No  Neurological Headaches?: No Dizziness?: No  Psychologic Depression?: No Anxiety?: No  Physical Exam: BP (!) 144/75   Pulse 94   Ht 5\' 8"  (1.727 m)   Wt 199 lb 11.2 oz (90.6 kg)   BMI 30.36 kg/m    Constitutional: Well nourished. Alert and oriented, No acute distress. HEENT: [place] AT, moist mucus membranes. Trachea midline, no masses. Cardiovascular: No clubbing, cyanosis, or edema. Respiratory: Normal respiratory effort, no increased work of breathing. GI: Abdomen is soft, non tender, non distended, no abdominal masses. Liver and spleen not palpable.  No hernias appreciated.  Stool sample for occult testing is not indicated.   GU: No CVA tenderness.  No bladder fullness or masses.  Patient with circumcised phallus.   Urethral meatus is patent.  No penile discharge.  No penile lesions or rashes. Scrotum without lesions, cysts, rashes and/or edema.  Testicles are located scrotally bilaterally. No masses are appreciated in the testicles. Left and right epididymis are normal. Rectal: Patient with  normal sphincter tone. Anus and perineum without scarring or rashes. No rectal masses are appreciated. Prostate is approximately 45 grams, no nodules are appreciated. Seminal vesicles are normal. Skin: No rashes, bruises or suspicious lesions. Lymph: No cervical or inguinal adenopathy. Neurologic: Grossly intact, no focal deficits, moving all 4 extremities. Psychiatric: Normal mood and affect.  Laboratory Data: Lab Results  Component Value Date   WBC 6.2 03/06/2014   HGB 13.4 03/06/2014   HCT 39.2 (L) 03/06/2014   MCV 87 03/06/2014   PLT 198 03/06/2014    Lab Results  Component Value Date   CREATININE 0.96 03/06/2014     Lab Results  Component Value Date   AST 21 03/06/2014   Lab Results  Component Value Date   ALT 26 03/06/2014      Assessment & Plan:    1. Erectile dysfunction  - SHIM score is 7, it is worsening  - I explained to the patient that in order to achieve an erection it takes good functioning of the nervous system (parasympathetic, sympathetic, sensory and motor), good blood flow into the erectile tissue of the penis and a desire to have sex  - I explained that  conditions like diabetes, hypertension, coronary artery disease, peripheral vascular disease, smoking, alcohol consumption, age, sleep apnea and BPH can diminish the ability to have an erection  - Continue Trimix; refill sent to [facility]  - RTC in 12 months for repeat SHIM score and exam    Return in about 1 year (around 01/24/2018) for SHIM and exam.  These notes generated with voice recognition software. I apologize for typographical errors.  Zara Council, [facility] [address] [place], [place] [address] [phone]

## 2017-01-29 NOTE — Telephone Encounter (Signed)
Pt called back today asking about Trimix injection prescription.

## 2017-01-29 NOTE — Telephone Encounter (Signed)
[facility]

## 2017-02-14 DIAGNOSIS — I208 Other forms of angina pectoris: Secondary | ICD-10-CM | POA: Diagnosis present

## 2017-02-22 ENCOUNTER — Encounter
Admission: RE | Disposition: A | Discharge status: Home / Self Care | Payer: Self-pay | Source: Ambulatory Visit | Attending: Internal Medicine

## 2017-02-22 ENCOUNTER — Ambulatory Visit
Admission: RE | Admit: 2017-02-22 | Discharge: 2017-02-22 | Disposition: A | Discharge status: Home / Self Care | Payer: Medicare PPO | Source: Ambulatory Visit | Attending: Internal Medicine | Admitting: Internal Medicine

## 2017-02-22 DIAGNOSIS — E785 Hyperlipidemia, unspecified: Secondary | ICD-10-CM | POA: Insufficient documentation

## 2017-02-22 DIAGNOSIS — Z7984 Long term (current) use of oral hypoglycemic drugs: Secondary | ICD-10-CM | POA: Insufficient documentation

## 2017-02-22 DIAGNOSIS — Z79899 Other long term (current) drug therapy: Secondary | ICD-10-CM | POA: Insufficient documentation

## 2017-02-22 DIAGNOSIS — E119 Type 2 diabetes mellitus without complications: Secondary | ICD-10-CM | POA: Diagnosis not present

## 2017-02-22 DIAGNOSIS — I208 Other forms of angina pectoris: Secondary | ICD-10-CM | POA: Diagnosis present

## 2017-02-22 DIAGNOSIS — R079 Chest pain, unspecified: Secondary | ICD-10-CM | POA: Diagnosis not present

## 2017-02-22 DIAGNOSIS — G629 Polyneuropathy, unspecified: Secondary | ICD-10-CM | POA: Diagnosis not present

## 2017-02-22 DIAGNOSIS — Z87891 Personal history of nicotine dependence: Secondary | ICD-10-CM | POA: Diagnosis not present

## 2017-02-22 DIAGNOSIS — Z7982 Long term (current) use of aspirin: Secondary | ICD-10-CM | POA: Insufficient documentation

## 2017-02-22 DIAGNOSIS — Z791 Long term (current) use of non-steroidal anti-inflammatories (NSAID): Secondary | ICD-10-CM | POA: Diagnosis not present

## 2017-02-22 DIAGNOSIS — I10 Essential (primary) hypertension: Secondary | ICD-10-CM | POA: Diagnosis not present

## 2017-02-22 DIAGNOSIS — Z8249 Family history of ischemic heart disease and other diseases of the circulatory system: Secondary | ICD-10-CM | POA: Insufficient documentation

## 2017-02-22 DIAGNOSIS — I2089 Other forms of angina pectoris: Secondary | ICD-10-CM | POA: Diagnosis present

## 2017-02-22 DIAGNOSIS — I2510 Atherosclerotic heart disease of native coronary artery without angina pectoris: Secondary | ICD-10-CM | POA: Diagnosis present

## 2017-02-22 HISTORY — DX: Gastro-esophageal reflux disease without esophagitis: K21.9

## 2017-02-22 HISTORY — PX: LEFT HEART CATH AND CORONARY ANGIOGRAPHY: CATH118249

## 2017-02-22 LAB — GLUCOSE, CAPILLARY: Glucose-Capillary: 157 mg/dL — ABNORMAL HIGH (ref 65–99)

## 2017-02-22 SURGERY — LEFT HEART CATH AND CORONARY ANGIOGRAPHY
Anesthesia: Moderate Sedation | Laterality: Left

## 2017-02-22 MED ORDER — FENTANYL CITRATE (PF) 100 MCG/2ML IJ SOLN
INTRAMUSCULAR | Status: DC | PRN
  Administered 2017-02-22: 25 ug via INTRAVENOUS

## 2017-02-22 MED ORDER — MIDAZOLAM HCL 2 MG/2ML IJ SOLN
INTRAMUSCULAR | Status: AC
  Filled 2017-02-22: qty 2

## 2017-02-22 MED ORDER — FENTANYL CITRATE (PF) 100 MCG/2ML IJ SOLN
INTRAMUSCULAR | Status: AC
  Filled 2017-02-22: qty 2

## 2017-02-22 MED ORDER — SODIUM CHLORIDE FLUSH 0.9 % IV SOLN
INTRAVENOUS | Status: AC
  Filled 2017-02-22: qty 10

## 2017-02-22 MED ORDER — MIDAZOLAM HCL 2 MG/2ML IJ SOLN
INTRAMUSCULAR | Status: DC | PRN
  Administered 2017-02-22: 1 mg via INTRAVENOUS

## 2017-02-22 MED ORDER — IOPAMIDOL (ISOVUE-300) INJECTION 61%
INTRAVENOUS | Status: DC | PRN
  Administered 2017-02-22: 100 mL via INTRA_ARTERIAL

## 2017-02-22 MED ORDER — HEPARIN (PORCINE) IN NACL 2-0.9 UNIT/ML-% IJ SOLN
INTRAMUSCULAR | Status: AC
Start: 2017-02-22 — End: ?
  Filled 2017-02-22: qty 500

## 2017-02-22 SURGICAL SUPPLY — 9 items
CATH 5FR PIGTAIL DIAGNOSTIC (CATHETERS) ×3 IMPLANT
CATH INFINITI 5FR JL4 (CATHETERS) ×3 IMPLANT
CATH INFINITI JR4 5F (CATHETERS) ×3 IMPLANT
DEVICE CLOSURE MYNXGRIP 5F (Vascular Products) ×3 IMPLANT
KIT MANI 3VAL PERCEP (MISCELLANEOUS) ×3 IMPLANT
NEEDLE PERC 18GX7CM (NEEDLE) ×3 IMPLANT
PACK CARDIAC CATH (CUSTOM PROCEDURE TRAY) ×3 IMPLANT
SHEATH PINNACLE 5F 10CM (SHEATH) ×3 IMPLANT
WIRE EMERALD 3MM-J .035X150CM (WIRE) ×3 IMPLANT

## 2017-02-22 NOTE — Discharge Instructions (Signed)

## 2017-02-23 ENCOUNTER — Encounter: Payer: Self-pay | Admitting: Internal Medicine

## 2017-04-05 ENCOUNTER — Institutional Professional Consult (permissible substitution) (INDEPENDENT_AMBULATORY_CARE_PROVIDER_SITE_OTHER): Payer: Medicare PPO | Admitting: Cardiothoracic Surgery

## 2017-04-05 ENCOUNTER — Encounter: Payer: Self-pay | Admitting: Cardiothoracic Surgery

## 2017-04-05 VITALS — BP 155/78 | HR 94 | Resp 16 | Ht 71.0 in | Wt 203.0 lb

## 2017-04-05 DIAGNOSIS — I209 Angina pectoris, unspecified: Secondary | ICD-10-CM

## 2017-04-05 DIAGNOSIS — I2510 Atherosclerotic heart disease of native coronary artery without angina pectoris: Secondary | ICD-10-CM | POA: Diagnosis not present

## 2017-04-05 NOTE — Progress Notes (Signed)
PCP is Harriett Neita Carp, MD Referring Provider is Corey Skains, MD  Chief Complaint  Patient presents with  . Coronary Artery Disease    eval for CABG...ECHO 02/12/17, CATH 02/22/17 @ [facility]   Patient examined, coronary angiogram performed approximately 4 weeks ago personally reviewed and images counseled with patient and wife  HPI: 71 year old type II diabetic nonsmoker presents with recently diagnosed multivessel coronary disease and preserved LV function. The patient had been having increasing episodes of substernal pain and tightness with exertion. The frequency and intensity of these episodes had increased. The patient underwent a stress test by his cardiologist which was equivocal. Because the patient had persistent episodes of pain which were relieved by nitroglycerin he underwent coronary angiogram. This demonstrated probable moderate coronary disease and the the patient's antianginal medications were increased. However his pain was persistent or even increased. Review of  the coronary angiogram by his cardiologist was then performed and was felt the patient should have multivessel CABG. I have personally reviewed the angiogram images and feel that his left main stenosis is probably more significant because of a hazy-lucent area at the distal left main. The codominant distal circumflex has high-grade 90% stenosis of a small posterior lateral branch. The codominant RCA has a 90% stenosis of a small distal RCA. The LAD and circumflex are most likely compromised by the left main stenosis. I agree with recommendation for multivessel CABG due to the patient's coronary anatomy and persistent symptoms.   The patient had no complications from the cardiac catheter via right femoral artery. The patient has positive family history of CAD-1 brother has had PCI Over 25 years ago the patient used alcohol to excess and was told he had an episode of heart failure--however he has been completely off alcohol  for decades and EF is normal by cath.   The patient is taking nitroglycerin 3-4 times per day sometimes at rest. He is also on Imdur   Past Medical History:  Diagnosis Date  . Arthritis   . Chronic pain syndrome   . Congestive heart failure ([place]) 1980  . Diabetes ([place])    DIET  . ED (erectile dysfunction)   . GERD (gastroesophageal reflux disease)   . Gout   . Hyperlipemia   . Hypertension    CONTROLLED ON MEDS  . Neuropathy of both feet   . Seborrheic keratosis   . Sinus congestion   . Wears dentures    PARTIAL UPPER    Past Surgical History:  Procedure Laterality Date  . COLONOSCOPY WITH PROPOFOL N/A 10/25/2015   Procedure: COLONOSCOPY WITH PROPOFOL;  Surgeon: Lucilla Lame, MD;  Location: [facility];  Service: Endoscopy;  Laterality: N/A;  DIABETIC-ORAL MEDS  . KNEE SURGERY Right    over 20 years ago  . LEFT HEART CATH AND CORONARY ANGIOGRAPHY Left 02/22/2017   Procedure: Left Heart Cath and Coronary Angiography;  Surgeon: Corey Skains, MD;  Location: [facility];  Service: Cardiovascular;  Laterality: Left;  . POLYPECTOMY  10/25/2015   Procedure: POLYPECTOMY;  Surgeon: Lucilla Lame, MD;  Location: [facility];  Service: Endoscopy;;  . ROTATOR CUFF REPAIR Right 2012    Family History  Problem Relation Age of Onset  . Cancer Father   . Heart disease Father   . Cancer Mother   . Heart disease Brother   . Diabetes Brother   . Prostate cancer Brother   . Diabetes Sister   . Bladder Cancer Neg Hx   .  Kidney disease Neg Hx     Social History Social History  Substance Use Topics  . Smoking status: Former Smoker    Packs/day: 2.00    Years: 15.00    Types: Cigarettes    Quit date: 06/29/1980  . Smokeless tobacco: Never Used  . Alcohol use No     Comment: QUIT IN 1980    Current Outpatient Prescriptions  Medication Sig Dispense Refill  . alprostadil (EDEX) 20 MCG injection 20 mcg by Intracavitary route as needed for erectile  dysfunction. use no more than 3 times per week 1 each 0  . amLODipine (NORVASC) 10 MG tablet Take 10 mg by mouth daily. AM    . aspirin 81 MG tablet Take 81 mg by mouth daily. AM    . atorvastatin (LIPITOR) 80 MG tablet Take 80 mg by mouth daily. AM    . beclomethasone (QVAR) 40 MCG/ACT inhaler Inhale 2 puffs into the lungs 2 (two) times daily.    . carvedilol (COREG) 3.125 MG tablet Take 3.125 mg by mouth 2 (two) times daily with a meal.    . gabapentin (NEURONTIN) 800 MG tablet Take 1,600 mg by mouth 3 (three) times daily.    . isosorbide mononitrate (IMDUR) 60 MG 24 hr tablet Take 60 mg by mouth 2 (two) times daily.     Marland Kitchen lisinopril (PRINIVIL,ZESTRIL) 20 MG tablet Take 20 mg by mouth daily. AFTERNOON    . metFORMIN (GLUCOPHAGE-XR) 500 MG 24 hr tablet Take 500 mg by mouth 2 (two) times daily.     Marland Kitchen omeprazole (PRILOSEC) 20 MG capsule Take 20 mg by mouth daily.    Marland Kitchen oxyCODONE-acetaminophen (PERCOCET) 10-325 MG per tablet Take 2 tablets by mouth every 4 (four) hours as needed for pain.     . pioglitazone (ACTOS) 15 MG tablet Take 15 mg by mouth daily. PM     No current facility-administered medications for this visit.     Allergies  Allergen Reactions  . Cortisone Acetate [Cortisone] Rash    Cortisone injection    Review of Systems         Review of Systems :  [ y ] = yes, [  ] = no        General :  Weight gain [   ]    Weight loss  [   ]  Fatigue [  ]  Fever [  ]  Chills  [  ]                                Weakness  [  ]           HEENT    Headache [  ]  Dizziness [  ]  Blurred vision [  ] Glaucoma  [  ]                          Nosebleeds [  ] Painful or loose teeth [  ] sinus congestion, nasal yellow mucus-patient placed on amoxicillin by his primary care physician        Cardiac :  Chest pain/ pressure [ y ]  Resting SOB [  ] exertional SOB [ y ]                        Orthopnea [  ]  Pedal edema  [  ]  Palpitations [  ] Syncope/presyncope [ ]                          Paroxysmal nocturnal dyspnea [  ]         Pulmonary : cough Blue.Reese  ]  wheezing [  ]  Hemoptysis [  ] Sputum [  ] Snoring [  ]                              Pneumothorax [  ]  Sleep apnea [  ]        GI : Vomiting [  ]  Dysphagia [  ]  Melena  [  ]  Abdominal pain [  ] BRBPR [  ]              Heart burn [  ]  Constipation [  ] Diarrhea  [  ] Colonoscopy [   ]        GU : Hematuria [  ]  Dysuria [  ]  Nocturia [  ] UTI's [  ]        Vascular : Claudication [  ]  Rest pain [  ]  DVT [  ] Vein stripping [  ] leg ulcers [  ]                          TIA [  ] Stroke [  ]  Varicose veins [  ]        NEURO :  Headaches  [  ] Seizures [  ] Vision changes [  ] Paresthesias [  ]                                       Seizures [  ]        Musculoskeletal :  Arthritis [ yes ] Gout  [  ]  Back pain [  ]  Joint pain [place].Blacker  ]             The patient has had at least 2 right shoulder operations with chronic pain of his right shoulder and neck on chronic oxycodone       Skin :  Rash [  ]  Melanoma [  ] Sores [  ]        Heme : Bleeding problems [  ]Clotting Disorders [  ] Anemia [  ]Blood Transfusion [ ]         Endocrine : Diabetes [place].Blacker  ] Heat or Cold intolerance [  ] Polyuria [  ]excessive thirst [ ] patient states his hemoglobin A1c was 6.2        Psych : Depression [  ]  Anxiety [  ]  Psych hospitalizations [  ] Memory change [  ]         Right-hand dominant   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                                     BP (!) 155/78 (BP Location: Right Arm, Patient Position: Sitting, Cuff Size: Large)   Pulse 94   Resp 16   Ht  5\' 11"  (1.803 m)   Wt 203 lb (92.1 kg)   SpO2 98% Comment: ON RA  BMI 28.31 kg/m  Physical Exam     Physical Exam  General: Overweight middle-aged Caucasian male no acute distress, anxious HEENT: Normocephalic pupils equal , dentition adequate with lower plate Neck: Supple without JVD, adenopathy, or bruit Chest: Clear to auscultation, symmetrical  breath sounds, no rhonchi, no tenderness             or deformity Cardiovascular: Regular rate and rhythm, no murmur, no gallop, peripheral pulses             palpable in all extremities Abdomen:  Soft, nontender, no palpable mass or organomegaly Extremities: Warm, well-perfused, no clubbing cyanosis edema or tenderness,              no venous stasis changes of the legs Rectal/GU: Deferred Neuro: Grossly non--focal and symmetrical throughout Skin: Clean and dry without rash or ulceration   Diagnostic Tests:  severe three-vessel coronary disease in a diabetic pattern preserved LV function   Impression:  patient  Will benefit from multivessel CABG for relief  of symptoms and improved survival with grafts planned to the LAD, OM, distal circumflex, and RCA.   Plan:Patient will stop his diabetic medications 48 hours before surgery.  \I discussed the details and expected benefits of CABG With the patient and his wife.   he also understands the risk of stroke, bleeding, infection, MI, postoperative pulmonary problems including pleural effusion, and death. He agrees to proceed with surgery which be scheduled for Tuesday, April 24 at [facility] hospital   Len Childs, MD [facility] [phone]

## 2017-04-06 ENCOUNTER — Other Ambulatory Visit: Payer: Self-pay | Admitting: *Deleted

## 2017-04-06 DIAGNOSIS — I2510 Atherosclerotic heart disease of native coronary artery without angina pectoris: Secondary | ICD-10-CM

## 2017-04-09 ENCOUNTER — Ambulatory Visit (HOSPITAL_COMMUNITY)
Admission: RE | Admit: 2017-04-09 | Discharge: 2017-04-09 | Disposition: A | Discharge status: Home / Self Care | Payer: Medicare PPO | Source: Ambulatory Visit | Attending: Cardiothoracic Surgery | Admitting: Cardiothoracic Surgery

## 2017-04-09 ENCOUNTER — Encounter (HOSPITAL_COMMUNITY)
Admission: RE | Admit: 2017-04-09 | Discharge: 2017-04-09 | Disposition: A | Discharge status: Home / Self Care | Payer: Medicare PPO | Source: Ambulatory Visit | Attending: Cardiothoracic Surgery | Admitting: Cardiothoracic Surgery

## 2017-04-09 ENCOUNTER — Encounter (HOSPITAL_COMMUNITY): Payer: Self-pay | Admitting: Certified Registered Nurse Anesthetist

## 2017-04-09 ENCOUNTER — Ambulatory Visit (HOSPITAL_BASED_OUTPATIENT_CLINIC_OR_DEPARTMENT_OTHER)
Admission: RE | Admit: 2017-04-09 | Discharge: 2017-04-09 | Disposition: A | Discharge status: Home / Self Care | Payer: Medicare PPO | Source: Ambulatory Visit | Attending: Cardiothoracic Surgery | Admitting: Cardiothoracic Surgery

## 2017-04-09 ENCOUNTER — Encounter (HOSPITAL_COMMUNITY): Payer: Self-pay

## 2017-04-09 DIAGNOSIS — I2510 Atherosclerotic heart disease of native coronary artery without angina pectoris: Secondary | ICD-10-CM

## 2017-04-09 DIAGNOSIS — J984 Other disorders of lung: Secondary | ICD-10-CM

## 2017-04-09 DIAGNOSIS — Z01818 Encounter for other preprocedural examination: Secondary | ICD-10-CM

## 2017-04-09 DIAGNOSIS — I700 Atherosclerosis of aorta: Secondary | ICD-10-CM

## 2017-04-09 HISTORY — DX: Atherosclerotic heart disease of native coronary artery without angina pectoris: I25.10

## 2017-04-09 HISTORY — DX: Headache: R51

## 2017-04-09 HISTORY — DX: Dyspnea, unspecified: R06.00

## 2017-04-09 HISTORY — DX: Headache, unspecified: R51.9

## 2017-04-09 LAB — PULMONARY FUNCTION TEST
DL/VA % pred: 88 %
DL/VA: 4.1 ml/min/mmHg/L
DLCO unc % pred: 72 %
DLCO unc: 24.56 ml/min/mmHg
FEF 25-75 Post: 3.41 L/sec
FEF 25-75 Pre: 4.18 L/sec
FEF2575-%Change-Post: -18 %
FEF2575-%Pred-Post: 129 %
FEF2575-%Pred-Pre: 158 %
FEV1-%Change-Post: -2 %
FEV1-%Pred-Post: 97 %
FEV1-%Pred-Pre: 100 %
FEV1-Post: 3.35 L
FEV1-Pre: 3.44 L
FEV1FVC-%Change-Post: 1 %
FEV1FVC-%Pred-Pre: 115 %
FEV6-%Change-Post: -3 %
FEV6-%Pred-Post: 87 %
FEV6-%Pred-Pre: 91 %
FEV6-Post: 3.87 L
FEV6-Pre: 4.02 L
FEV6FVC-%Pred-Post: 105 %
FEV6FVC-%Pred-Pre: 105 %
FVC-%Change-Post: -3 %
FVC-%Pred-Post: 83 %
FVC-%Pred-Pre: 86 %
FVC-Post: 3.87 L
FVC-Pre: 4.02 L
Post FEV1/FVC ratio: 87 %
Post FEV6/FVC ratio: 100 %
Pre FEV1/FVC ratio: 86 %
Pre FEV6/FVC Ratio: 100 %
RV % pred: 89 %
RV: 2.21 L
TLC % pred: 88 %
TLC: 6.38 L

## 2017-04-09 LAB — CBC
HCT: 35.9 % — ABNORMAL LOW (ref 39.0–52.0)
Hemoglobin: 12 g/dL — ABNORMAL LOW (ref 13.0–17.0)
MCH: 29.2 pg (ref 26.0–34.0)
MCHC: 33.4 g/dL (ref 30.0–36.0)
MCV: 87.3 fL (ref 78.0–100.0)
Platelets: 187 10*3/uL (ref 150–400)
RBC: 4.11 MIL/uL — ABNORMAL LOW (ref 4.22–5.81)
RDW: 15 % (ref 11.5–15.5)
WBC: 5.8 10*3/uL (ref 4.0–10.5)

## 2017-04-09 LAB — URINALYSIS, ROUTINE W REFLEX MICROSCOPIC
Bilirubin Urine: NEGATIVE
Glucose, UA: NEGATIVE mg/dL
Hgb urine dipstick: NEGATIVE
Ketones, ur: NEGATIVE mg/dL
Leukocytes, UA: NEGATIVE
Nitrite: NEGATIVE
Protein, ur: NEGATIVE mg/dL
Specific Gravity, Urine: 1.003 — ABNORMAL LOW (ref 1.005–1.030)
pH: 5 (ref 5.0–8.0)

## 2017-04-09 LAB — COMPREHENSIVE METABOLIC PANEL
ALT: 24 U/L (ref 17–63)
AST: 19 U/L (ref 15–41)
Albumin: 3.9 g/dL (ref 3.5–5.0)
Alkaline Phosphatase: 70 U/L (ref 38–126)
Anion gap: 10 (ref 5–15)
BUN: 8 mg/dL (ref 6–20)
CO2: 23 mmol/L (ref 22–32)
Calcium: 9 mg/dL (ref 8.9–10.3)
Chloride: 106 mmol/L (ref 101–111)
Creatinine, Ser: 0.86 mg/dL (ref 0.61–1.24)
GFR calc Af Amer: >60 mL/min (ref >60)
GFR calc non Af Amer: >60 mL/min (ref >60)
Glucose, Bld: 148 mg/dL — ABNORMAL HIGH (ref 65–99)
Potassium: 4.1 mmol/L (ref 3.5–5.1)
Sodium: 139 mmol/L (ref 135–145)
Total Bilirubin: 1 mg/dL (ref 0.3–1.2)
Total Protein: 7.4 g/dL (ref 6.5–8.1)

## 2017-04-09 LAB — PROTIME-INR
INR: 1.06
Prothrombin Time: 13.8 seconds (ref 11.4–15.2)

## 2017-04-09 LAB — BLOOD GAS, ARTERIAL
Acid-Base Excess: 0.3 mmol/L (ref 0.0–2.0)
Bicarbonate: 24.4 mmol/L (ref 20.0–28.0)
Drawn by: 421801
FIO2: 21
O2 Saturation: 98.4 %
Patient temperature: 98.6
pCO2 arterial: 39.5 mmHg (ref 32.0–48.0)
pH, Arterial: 7.407 (ref 7.350–7.450)
pO2, Arterial: 110 mmHg — ABNORMAL HIGH (ref 83.0–108.0)

## 2017-04-09 LAB — SURGICAL PCR SCREEN
MRSA, PCR: POSITIVE — AB
Staphylococcus aureus: POSITIVE — AB

## 2017-04-09 LAB — ABO/RH: ABO/RH(D): O POS

## 2017-04-09 LAB — APTT: aPTT: 38 seconds — ABNORMAL HIGH (ref 24–36)

## 2017-04-09 LAB — GLUCOSE, CAPILLARY: Glucose-Capillary: 175 mg/dL — ABNORMAL HIGH (ref 65–99)

## 2017-04-09 MED ORDER — PLASMA-LYTE 148 IV SOLN
INTRAVENOUS | Status: AC
  Administered 2017-04-10: 09:00:00
  Filled 2017-04-09 (×2): qty 2.5

## 2017-04-09 MED ORDER — POTASSIUM CHLORIDE 2 MEQ/ML IV SOLN
80.0000 meq | INTRAVENOUS | Status: DC
  Filled 2017-04-09: qty 40

## 2017-04-09 MED ORDER — TRANEXAMIC ACID (OHS) BOLUS VIA INFUSION
15.0000 mg/kg | INTRAVENOUS | Status: AC
  Administered 2017-04-10: 1381.5 mg via INTRAVENOUS
  Filled 2017-04-09: qty 1382

## 2017-04-09 MED ORDER — VANCOMYCIN HCL 10 G IV SOLR
1500.0000 mg | INTRAVENOUS | Status: AC
  Administered 2017-04-10: 1500 mg via INTRAVENOUS
  Filled 2017-04-09: qty 1500

## 2017-04-09 MED ORDER — ALBUTEROL SULFATE (2.5 MG/3ML) 0.083% IN NEBU
2.5000 mg | INHALATION_SOLUTION | Freq: Once | RESPIRATORY_TRACT | Status: AC
  Administered 2017-04-09: 2.5 mg via RESPIRATORY_TRACT

## 2017-04-09 MED ORDER — DEXTROSE 5 % IV SOLN
1.5000 g | INTRAVENOUS | Status: AC
  Administered 2017-04-10: .75 g via INTRAVENOUS
  Administered 2017-04-10: 1.5 g via INTRAVENOUS
  Filled 2017-04-09: qty 1.5

## 2017-04-09 MED ORDER — MAGNESIUM SULFATE 50 % IJ SOLN
40.0000 meq | INTRAMUSCULAR | Status: DC
  Filled 2017-04-09: qty 10

## 2017-04-09 MED ORDER — SODIUM CHLORIDE 0.9 % IV SOLN
1.5000 mg/kg/h | INTRAVENOUS | Status: AC
  Administered 2017-04-10: 1.5 mg/kg/h via INTRAVENOUS
  Filled 2017-04-09: qty 25

## 2017-04-09 MED ORDER — SODIUM CHLORIDE 0.9 % IV SOLN
30.0000 ug/min | INTRAVENOUS | Status: AC
  Administered 2017-04-10: 25 ug/min via INTRAVENOUS
  Filled 2017-04-09: qty 2

## 2017-04-09 MED ORDER — DOPAMINE-DEXTROSE 3.2-5 MG/ML-% IV SOLN
0.0000 ug/kg/min | INTRAVENOUS | Status: DC
  Filled 2017-04-09: qty 250

## 2017-04-09 MED ORDER — METOPROLOL TARTRATE 12.5 MG HALF TABLET: 12.5000 mg | ORAL_TABLET | Freq: Once | ORAL | Status: DC

## 2017-04-09 MED ORDER — DEXTROSE 5 % IV SOLN
750.0000 mg | INTRAVENOUS | Status: DC
  Filled 2017-04-09: qty 750

## 2017-04-09 MED ORDER — EPINEPHRINE PF 1 MG/ML IJ SOLN
0.0000 ug/min | INTRAMUSCULAR | Status: AC
Start: 2017-04-10 — End: 2017-04-10
  Administered 2017-04-10: 2 ug/min via INTRAVENOUS
  Filled 2017-04-09: qty 4

## 2017-04-09 MED ORDER — NITROGLYCERIN IN D5W 200-5 MCG/ML-% IV SOLN
2.0000 ug/min | INTRAVENOUS | Status: AC
  Administered 2017-04-10: 5 ug/min via INTRAVENOUS
  Filled 2017-04-09: qty 250

## 2017-04-09 MED ORDER — SODIUM CHLORIDE 0.9 % IV SOLN
INTRAVENOUS | Status: AC
  Administered 2017-04-10: 2 [IU]/h via INTRAVENOUS
  Filled 2017-04-09: qty 2.5

## 2017-04-09 MED ORDER — DEXMEDETOMIDINE HCL IN NACL 400 MCG/100ML IV SOLN
0.1000 ug/kg/h | INTRAVENOUS | Status: AC
  Administered 2017-04-10: .3 ug/kg/h via INTRAVENOUS
  Filled 2017-04-09: qty 100

## 2017-04-09 MED ORDER — SODIUM CHLORIDE 0.9 % IV SOLN
INTRAVENOUS | Status: DC
  Filled 2017-04-09: qty 30

## 2017-04-09 MED ORDER — CHLORHEXIDINE GLUCONATE 0.12 % MT SOLN
15.0000 mL | Freq: Once | OROMUCOSAL | Status: AC
  Administered 2017-04-10: 15 mL via OROMUCOSAL
  Filled 2017-04-09: qty 15

## 2017-04-09 MED ORDER — TRANEXAMIC ACID (OHS) PUMP PRIME SOLUTION
2.0000 mg/kg | INTRAVENOUS | Status: DC
  Filled 2017-04-09: qty 1.84

## 2017-04-09 NOTE — Progress Notes (Signed)
Pre-op Cardiac Surgery  Carotid Findings:  1-39% ICA plaquing. Vertebral artery flow is antegrade.   Upper Extremity Right Left  Brachial Pressures 124T 137T  Radial Waveforms T T  Ulnar Waveforms T T  Palmar Arch (Allen's Test) WNL Doppler signal obliterates with radial compression and remains normal with ulnar compression.   Findings:      Lower  Extremity Right Left  Dorsalis Pedis    Anterior Tibial 185B 159B  Posterior Tibial 151B 144B  Ankle/Brachial Indices >1 >1    Findings:  ABIs are within normal limits

## 2017-04-09 NOTE — Pre-Procedure Instructions (Signed)
[place]  04/09/2017      [facility] [address]), [place] [address] [place] ([place]) [place] [address] Phone: [phone] Fax: [phone]    Your procedure is scheduled on 04-10-2017   Tuesday .  Report to [facility] Admitting at 5:30 A.M.  Call this number if you have problems the morning of surgery:  [phone]   Remember:  Do not eat food or drink liquids after midnight.   Take these medicines the morning of surgery with A SIP OF WATER Amlodipine(Norvasc),QVAR inhaler,carvedilol(Coreg),flonase nasal spray,gabapentin(Neurontin),isosorbide(Imdur)pain medication if needed    STOP ASPIRIN,ANTIINFLAMATORIES (IBUPROFEN,ALEVE,MOTRIN,ADVIL,GOODY'S POWDERS),HERBAL [place] 5-7 DAYS PRIOR TO SURGERY      How to Manage Your Diabetes Before and After Surgery  Why is it important to control my blood sugar before and after surgery? . Improving blood sugar levels before and after surgery helps healing and can limit problems. . A way of improving blood sugar control is eating a healthy diet by: o  Eating less sugar and carbohydrates o  Increasing activity/exercise o  Talking with your doctor about reaching your blood sugar goals . High blood sugars (greater than 180 mg/dL) can raise your risk of infections and slow your recovery, so you will need to focus on controlling your diabetes during the weeks before surgery. . Make sure that the doctor who takes care of your diabetes knows about your planned surgery including the date and location.  How do I manage my blood sugar before surgery? . Check your blood sugar at least 4 times a day, starting 2 days before surgery, to make sure that the level is not too high or low. o Check your blood sugar the morning of your surgery when you wake up and every 2 hours until you get to the Short Stay unit. . If your blood sugar is less than 70  mg/dL, you will need to treat for low blood sugar: o Do not take insulin. o Treat a low blood sugar (less than 70 mg/dL) with  cup of clear juice (cranberry or apple), 4 glucose tablets, OR glucose gel. o Recheck blood sugar in 15 minutes after treatment (to make sure it is greater than 70 mg/dL). If your blood sugar is not greater than 70 mg/dL on recheck, call [phone] for further instructions. . Report your blood sugar to the short stay nurse when you get to Short Stay.  . If you are admitted to the hospital after surgery: o Your blood sugar will be checked by the staff and you will probably be given insulin after surgery (instead of oral diabetes medicines) to make sure you have good blood sugar levels. o The goal for blood sugar control after surgery is 80-180 mg/dL.              WHAT DO I DO ABOUT MY DIABETES MEDICATION?   Marland Kitchen Do not take oral diabetes medicines (pills) the morning of surgery.   Reviewed and Endorsed by [facility] Patient Education Committee, August 2015      Do not wear jewelry, make-up or nail polish.  Do not wear lotions, powders, or perfumes, or deoderant.  Do not shave 48 hours prior to surgery.  Men may shave face and neck.   Do not bring valuables to the hospital.  [facility] is not responsible for any belongings or valuables.  Contacts, dentures or bridgework may not be worn into surgery.  Leave your  suitcase in the car.  After surgery it may be brought to your room.  For patients admitted to the hospital, discharge time will be determined by your treatment team.  Patients discharged the day of surgery will not be allowed to drive home.   Special Instructions: [facility] - Preparing for Surgery  Before surgery, you can play an important role.  Because skin is not sterile, your skin needs to be as free of germs as possible.  You can reduce the number of germs on you skin by washing with CHG (chlorahexidine gluconate) soap before  surgery.  CHG is an antiseptic cleaner which kills germs and bonds with the skin to continue killing germs even after washing.  Please DO NOT use if you have an allergy to CHG or antibacterial soaps.  If your skin becomes reddened/irritated stop using the CHG and inform your nurse when you arrive at Short Stay.  Do not shave (including legs and underarms) for at least 48 hours prior to the first CHG shower.  You may shave your face.  Please follow these instructions carefully:   1.  Shower with CHG Soap the night before surgery and the   morning of Surgery.  2.  If you choose to wash your hair, wash your hair first as usual with your normal shampoo.  3.  After you shampoo, rinse your hair and body thoroughly to remove the  Shampoo.  4.  Use CHG as you would any other liquid soap.  You can apply chg directly  to the skin and wash gently with scrungie or a clean washcloth.  5.  Apply the CHG Soap to your body ONLY FROM THE NECK DOWN.   Do not use on open wounds or open sores.  Avoid contact with your eyes,  ears, mouth and genitals (private parts).  Wash genitals (private parts) with your normal soap.  6.  Wash thoroughly, paying special attention to the area where your surgery will be performed.  7.  Thoroughly rinse your body with warm water from the neck down.  8.  DO NOT shower/wash with your normal soap after using and rinsing o  the CHG Soap.  9.  Pat yourself dry with a clean towel.            10.  Wear clean pajamas.            11.  Place clean sheets on your bed the night of your first shower and do not sleep with pets.  Day of Surgery  Do not apply any lotions/deodorants the morning of surgery.  Please wear clean clothes to the hospital/surgery center.   Please read over the following fact sheets that you were given. Pain Booklet, MRSA Information and Surgical Site Infection Prevention Incentive Spirometry

## 2017-04-10 ENCOUNTER — Inpatient Hospital Stay (HOSPITAL_COMMUNITY): Payer: Medicare PPO

## 2017-04-10 ENCOUNTER — Inpatient Hospital Stay (HOSPITAL_COMMUNITY): Payer: Medicare PPO | Admitting: Certified Registered Nurse Anesthetist

## 2017-04-10 ENCOUNTER — Encounter (HOSPITAL_COMMUNITY): Payer: Self-pay

## 2017-04-10 ENCOUNTER — Inpatient Hospital Stay (HOSPITAL_COMMUNITY)
Admission: RE | Disposition: A | Discharge status: Home / Self Care | Payer: Self-pay | Source: Ambulatory Visit | Attending: Cardiothoracic Surgery

## 2017-04-10 ENCOUNTER — Inpatient Hospital Stay (HOSPITAL_COMMUNITY)
Admission: RE | Admit: 2017-04-10 | Discharge: 2017-04-15 | DRG: 236 | Disposition: A | Discharge status: Home / Self Care | Payer: Medicare PPO | Source: Ambulatory Visit | Attending: Cardiothoracic Surgery | Admitting: Cardiothoracic Surgery

## 2017-04-10 DIAGNOSIS — E785 Hyperlipidemia, unspecified: Secondary | ICD-10-CM | POA: Diagnosis present

## 2017-04-10 DIAGNOSIS — E8770 Fluid overload, unspecified: Secondary | ICD-10-CM | POA: Diagnosis not present

## 2017-04-10 DIAGNOSIS — I10 Essential (primary) hypertension: Secondary | ICD-10-CM | POA: Diagnosis present

## 2017-04-10 DIAGNOSIS — I25110 Atherosclerotic heart disease of native coronary artery with unstable angina pectoris: Secondary | ICD-10-CM | POA: Diagnosis present

## 2017-04-10 DIAGNOSIS — Z87891 Personal history of nicotine dependence: Secondary | ICD-10-CM | POA: Diagnosis not present

## 2017-04-10 DIAGNOSIS — Z86010 Personal history of colonic polyps: Secondary | ICD-10-CM

## 2017-04-10 DIAGNOSIS — K219 Gastro-esophageal reflux disease without esophagitis: Secondary | ICD-10-CM | POA: Diagnosis present

## 2017-04-10 DIAGNOSIS — Z01818 Encounter for other preprocedural examination: Secondary | ICD-10-CM | POA: Diagnosis not present

## 2017-04-10 DIAGNOSIS — I9581 Postprocedural hypotension: Secondary | ICD-10-CM | POA: Diagnosis not present

## 2017-04-10 DIAGNOSIS — E1151 Type 2 diabetes mellitus with diabetic peripheral angiopathy without gangrene: Secondary | ICD-10-CM | POA: Diagnosis present

## 2017-04-10 DIAGNOSIS — E119 Type 2 diabetes mellitus without complications: Secondary | ICD-10-CM | POA: Diagnosis present

## 2017-04-10 DIAGNOSIS — Z951 Presence of aortocoronary bypass graft: Secondary | ICD-10-CM

## 2017-04-10 DIAGNOSIS — I2510 Atherosclerotic heart disease of native coronary artery without angina pectoris: Secondary | ICD-10-CM

## 2017-04-10 DIAGNOSIS — I252 Old myocardial infarction: Secondary | ICD-10-CM

## 2017-04-10 HISTORY — PX: TEE WITHOUT CARDIOVERSION: SHX5443

## 2017-04-10 HISTORY — PX: CORONARY ARTERY BYPASS GRAFT: SHX141

## 2017-04-10 LAB — POCT I-STAT 3, ART BLOOD GAS (G3+)
Acid-Base Excess: 2 mmol/L (ref 0.0–2.0)
Acid-base deficit: 3 mmol/L — ABNORMAL HIGH (ref 0.0–2.0)
Acid-base deficit: 4 mmol/L — ABNORMAL HIGH (ref 0.0–2.0)
Acid-base deficit: 1 mmol/L (ref 0.0–2.0)
Bicarbonate: 26.3 mmol/L (ref 20.0–28.0)
Bicarbonate: 25 mmol/L (ref 20.0–28.0)
Bicarbonate: 21.9 mmol/L (ref 20.0–28.0)
Bicarbonate: 21.7 mmol/L (ref 20.0–28.0)
Bicarbonate: 23.8 mmol/L (ref 20.0–28.0)
O2 Saturation: 100 %
O2 Saturation: 100 %
O2 Saturation: 98 %
O2 Saturation: 94 %
O2 Saturation: 95 %
Patient temperature: 36.5
Patient temperature: 38.2
Patient temperature: 38
TCO2: 27 mmol/L (ref 0–100)
TCO2: 26 mmol/L (ref 0–100)
TCO2: 23 mmol/L (ref 0–100)
TCO2: 23 mmol/L (ref 0–100)
TCO2: 25 mmol/L (ref 0–100)
pCO2 arterial: 39.8 mmHg (ref 32.0–48.0)
pCO2 arterial: 43 mmHg (ref 32.0–48.0)
pCO2 arterial: 35.4 mmHg (ref 32.0–48.0)
pCO2 arterial: 43.2 mmHg (ref 32.0–48.0)
pCO2 arterial: 42.2 mmHg (ref 32.0–48.0)
pH, Arterial: 7.428 (ref 7.350–7.450)
pH, Arterial: 7.372 (ref 7.350–7.450)
pH, Arterial: 7.397 (ref 7.350–7.450)
pH, Arterial: 7.314 — ABNORMAL LOW (ref 7.350–7.450)
pH, Arterial: 7.363 (ref 7.350–7.450)
pO2, Arterial: 292 mmHg — ABNORMAL HIGH (ref 83.0–108.0)
pO2, Arterial: 174 mmHg — ABNORMAL HIGH (ref 83.0–108.0)
pO2, Arterial: 111 mmHg — ABNORMAL HIGH (ref 83.0–108.0)
pO2, Arterial: 81 mmHg — ABNORMAL LOW (ref 83.0–108.0)
pO2, Arterial: 80 mmHg — ABNORMAL LOW (ref 83.0–108.0)

## 2017-04-10 LAB — POCT I-STAT, CHEM 8
BUN: 12 mg/dL (ref 6–20)
BUN: 10 mg/dL (ref 6–20)
BUN: 11 mg/dL (ref 6–20)
BUN: 9 mg/dL (ref 6–20)
BUN: 10 mg/dL (ref 6–20)
BUN: 9 mg/dL (ref 6–20)
Calcium, Ion: 1.24 mmol/L (ref 1.15–1.40)
Calcium, Ion: 1.06 mmol/L — ABNORMAL LOW (ref 1.15–1.40)
Calcium, Ion: 1.23 mmol/L (ref 1.15–1.40)
Calcium, Ion: 1.06 mmol/L — ABNORMAL LOW (ref 1.15–1.40)
Calcium, Ion: 1.1 mmol/L — ABNORMAL LOW (ref 1.15–1.40)
Calcium, Ion: 1.24 mmol/L (ref 1.15–1.40)
Chloride: 104 mmol/L (ref 101–111)
Chloride: 102 mmol/L (ref 101–111)
Chloride: 104 mmol/L (ref 101–111)
Chloride: 103 mmol/L (ref 101–111)
Chloride: 102 mmol/L (ref 101–111)
Chloride: 103 mmol/L (ref 101–111)
Creatinine, Ser: 0.8 mg/dL (ref 0.61–1.24)
Creatinine, Ser: 0.6 mg/dL — ABNORMAL LOW (ref 0.61–1.24)
Creatinine, Ser: 0.7 mg/dL (ref 0.61–1.24)
Creatinine, Ser: 0.6 mg/dL — ABNORMAL LOW (ref 0.61–1.24)
Creatinine, Ser: 0.7 mg/dL (ref 0.61–1.24)
Creatinine, Ser: 0.8 mg/dL (ref 0.61–1.24)
Glucose, Bld: 160 mg/dL — ABNORMAL HIGH (ref 65–99)
Glucose, Bld: 136 mg/dL — ABNORMAL HIGH (ref 65–99)
Glucose, Bld: 150 mg/dL — ABNORMAL HIGH (ref 65–99)
Glucose, Bld: 139 mg/dL — ABNORMAL HIGH (ref 65–99)
Glucose, Bld: 152 mg/dL — ABNORMAL HIGH (ref 65–99)
Glucose, Bld: 135 mg/dL — ABNORMAL HIGH (ref 65–99)
HCT: 31 % — ABNORMAL LOW (ref 39.0–52.0)
HCT: 24 % — ABNORMAL LOW (ref 39.0–52.0)
HCT: 26 % — ABNORMAL LOW (ref 39.0–52.0)
HCT: 22 % — ABNORMAL LOW (ref 39.0–52.0)
HCT: 25 % — ABNORMAL LOW (ref 39.0–52.0)
HCT: 23 % — ABNORMAL LOW (ref 39.0–52.0)
Hemoglobin: 10.5 g/dL — ABNORMAL LOW (ref 13.0–17.0)
Hemoglobin: 8.2 g/dL — ABNORMAL LOW (ref 13.0–17.0)
Hemoglobin: 8.8 g/dL — ABNORMAL LOW (ref 13.0–17.0)
Hemoglobin: 7.5 g/dL — ABNORMAL LOW (ref 13.0–17.0)
Hemoglobin: 8.5 g/dL — ABNORMAL LOW (ref 13.0–17.0)
Hemoglobin: 7.8 g/dL — ABNORMAL LOW (ref 13.0–17.0)
Potassium: 4.5 mmol/L (ref 3.5–5.1)
Potassium: 4.1 mmol/L (ref 3.5–5.1)
Potassium: 4.3 mmol/L (ref 3.5–5.1)
Potassium: 4.2 mmol/L (ref 3.5–5.1)
Potassium: 4.4 mmol/L (ref 3.5–5.1)
Potassium: 4.4 mmol/L (ref 3.5–5.1)
Sodium: 140 mmol/L (ref 135–145)
Sodium: 139 mmol/L (ref 135–145)
Sodium: 140 mmol/L (ref 135–145)
Sodium: 137 mmol/L (ref 135–145)
Sodium: 137 mmol/L (ref 135–145)
Sodium: 139 mmol/L (ref 135–145)
TCO2: 27 mmol/L (ref 0–100)
TCO2: 28 mmol/L (ref 0–100)
TCO2: 28 mmol/L (ref 0–100)
TCO2: 26 mmol/L (ref 0–100)
TCO2: 25 mmol/L (ref 0–100)
TCO2: 28 mmol/L (ref 0–100)

## 2017-04-10 LAB — URINALYSIS, COMPLETE (UACMP) WITH MICROSCOPIC
Bilirubin Urine: NEGATIVE
Glucose, UA: NEGATIVE mg/dL
Hgb urine dipstick: NEGATIVE
Ketones, ur: NEGATIVE mg/dL
Leukocytes, UA: NEGATIVE
Nitrite: NEGATIVE
Protein, ur: NEGATIVE mg/dL
RBC / HPF: NONE SEEN RBC/hpf (ref 0–5)
Specific Gravity, Urine: 1.01 (ref 1.005–1.030)
Squamous Epithelial / LPF: NONE SEEN
WBC, UA: NONE SEEN WBC/hpf (ref 0–5)
pH: 5.5 (ref 5.0–8.0)

## 2017-04-10 LAB — CBC
HCT: 30.3 % — ABNORMAL LOW (ref 39.0–52.0)
Hemoglobin: 10.6 g/dL — ABNORMAL LOW (ref 13.0–17.0)
MCH: 30.1 pg (ref 26.0–34.0)
MCHC: 35 g/dL (ref 30.0–36.0)
MCV: 86.1 fL (ref 78.0–100.0)
Platelets: 138 10*3/uL — ABNORMAL LOW (ref 150–400)
RBC: 3.52 MIL/uL — ABNORMAL LOW (ref 4.22–5.81)
RDW: 14.5 % (ref 11.5–15.5)
WBC: 10.3 10*3/uL (ref 4.0–10.5)

## 2017-04-10 LAB — POCT I-STAT 7, (LYTES, BLD GAS, ICA,H+H)
Acid-base deficit: 4 mmol/L — ABNORMAL HIGH (ref 0.0–2.0)
Bicarbonate: 22.6 mmol/L (ref 20.0–28.0)
Calcium, Ion: 1.21 mmol/L (ref 1.15–1.40)
HCT: 28 % — ABNORMAL LOW (ref 39.0–52.0)
Hemoglobin: 9.5 g/dL — ABNORMAL LOW (ref 13.0–17.0)
O2 Saturation: 96 %
Patient temperature: 36.5
Potassium: 4.1 mmol/L (ref 3.5–5.1)
Sodium: 141 mmol/L (ref 135–145)
TCO2: 24 mmol/L (ref 0–100)
pCO2 arterial: 46.2 mmHg (ref 32.0–48.0)
pH, Arterial: 7.296 — ABNORMAL LOW (ref 7.350–7.450)
pO2, Arterial: 91 mmHg (ref 83.0–108.0)

## 2017-04-10 LAB — POCT I-STAT 4, (NA,K, GLUC, HGB,HCT)
Glucose, Bld: 145 mg/dL — ABNORMAL HIGH (ref 65–99)
HCT: 26 % — ABNORMAL LOW (ref 39.0–52.0)
Hemoglobin: 8.8 g/dL — ABNORMAL LOW (ref 13.0–17.0)
Potassium: 4.3 mmol/L (ref 3.5–5.1)
Sodium: 141 mmol/L (ref 135–145)

## 2017-04-10 LAB — PREPARE RBC (CROSSMATCH)

## 2017-04-10 LAB — HEMOGLOBIN A1C
Hgb A1c MFr Bld: 5.8 % — ABNORMAL HIGH (ref 4.8–5.6)
Mean Plasma Glucose: 120 mg/dL

## 2017-04-10 LAB — GLUCOSE, CAPILLARY
Glucose-Capillary: 168 mg/dL — ABNORMAL HIGH (ref 65–99)
Glucose-Capillary: 126 mg/dL — ABNORMAL HIGH (ref 65–99)
Glucose-Capillary: 120 mg/dL — ABNORMAL HIGH (ref 65–99)
Glucose-Capillary: 113 mg/dL — ABNORMAL HIGH (ref 65–99)
Glucose-Capillary: 154 mg/dL — ABNORMAL HIGH (ref 65–99)
Glucose-Capillary: 138 mg/dL — ABNORMAL HIGH (ref 65–99)
Glucose-Capillary: 104 mg/dL — ABNORMAL HIGH (ref 65–99)
Glucose-Capillary: 98 mg/dL (ref 65–99)
Glucose-Capillary: 123 mg/dL — ABNORMAL HIGH (ref 65–99)

## 2017-04-10 LAB — CREATININE, SERUM
Creatinine, Ser: 0.8 mg/dL (ref 0.61–1.24)
GFR calc Af Amer: >60 mL/min (ref >60)
GFR calc non Af Amer: >60 mL/min (ref >60)

## 2017-04-10 LAB — APTT: aPTT: 35 seconds (ref 24–36)

## 2017-04-10 LAB — PROTIME-INR
INR: 1.37
Prothrombin Time: 17 seconds — ABNORMAL HIGH (ref 11.4–15.2)

## 2017-04-10 LAB — HEMOGLOBIN AND HEMATOCRIT, BLOOD
HCT: 23.2 % — ABNORMAL LOW (ref 39.0–52.0)
Hemoglobin: 8 g/dL — ABNORMAL LOW (ref 13.0–17.0)

## 2017-04-10 LAB — MAGNESIUM: Magnesium: 2.6 mg/dL — ABNORMAL HIGH (ref 1.7–2.4)

## 2017-04-10 LAB — PLATELET COUNT: Platelets: 158 10*3/uL (ref 150–400)

## 2017-04-10 SURGERY — CORONARY ARTERY BYPASS GRAFTING (CABG)
Anesthesia: General | Site: Chest

## 2017-04-10 MED ORDER — ROCURONIUM BROMIDE 10 MG/ML (PF) SYRINGE
PREFILLED_SYRINGE | INTRAVENOUS | Status: AC
  Filled 2017-04-10: qty 5

## 2017-04-10 MED ORDER — SODIUM CHLORIDE 0.45 % IV SOLN
INTRAVENOUS | Status: DC | PRN
  Administered 2017-04-10: 15:00:00 via INTRAVENOUS

## 2017-04-10 MED ORDER — ACETAMINOPHEN 160 MG/5ML PO SOLN: 650.0000 mg | Freq: Once | ORAL | Status: AC

## 2017-04-10 MED ORDER — PLASMA-LYTE 148 IV SOLN
INTRAVENOUS | Status: DC | PRN
  Administered 2017-04-10: 11:00:00 via INTRAVASCULAR

## 2017-04-10 MED ORDER — ACETAMINOPHEN 160 MG/5ML PO SOLN: 1000.0000 mg | Freq: Four times a day (QID) | ORAL | Status: DC

## 2017-04-10 MED ORDER — NOREPINEPHRINE BITARTRATE 1 MG/ML IV SOLN
0.0000 ug/min | INTRAVENOUS | Status: DC
  Filled 2017-04-10: qty 4

## 2017-04-10 MED ORDER — PROTAMINE SULFATE 10 MG/ML IV SOLN
INTRAVENOUS | Status: DC | PRN
  Administered 2017-04-10: 10 mg via INTRAVENOUS
  Administered 2017-04-10: 290 mg via INTRAVENOUS

## 2017-04-10 MED ORDER — CALCIUM CHLORIDE 10 % IV SOLN
INTRAVENOUS | Status: DC | PRN
  Administered 2017-04-10: 200 mg via INTRAVENOUS
  Administered 2017-04-10: 300 mg via INTRAVENOUS
  Administered 2017-04-10: 200 mg via INTRAVENOUS

## 2017-04-10 MED ORDER — LACTATED RINGERS IV SOLN: INTRAVENOUS | Status: DC

## 2017-04-10 MED ORDER — PROPOFOL 10 MG/ML IV BOLUS
INTRAVENOUS | Status: DC | PRN
  Administered 2017-04-10: 50 mg via INTRAVENOUS
  Administered 2017-04-10: 100 mg via INTRAVENOUS

## 2017-04-10 MED ORDER — SODIUM CHLORIDE 0.9 % IV SOLN
0.0000 ug/kg/h | INTRAVENOUS | Status: DC
  Filled 2017-04-10: qty 2

## 2017-04-10 MED ORDER — METOPROLOL TARTRATE 25 MG/10 ML ORAL SUSPENSION: 12.5000 mg | Freq: Two times a day (BID) | ORAL | Status: DC

## 2017-04-10 MED ORDER — MIDAZOLAM HCL 5 MG/5ML IJ SOLN
INTRAMUSCULAR | Status: DC | PRN
  Administered 2017-04-10 (×2): 1 mg via INTRAVENOUS
  Administered 2017-04-10: 2 mg via INTRAVENOUS
  Administered 2017-04-10: 3 mg via INTRAVENOUS
  Administered 2017-04-10: 2 mg via INTRAVENOUS
  Administered 2017-04-10: 1 mg via INTRAVENOUS

## 2017-04-10 MED ORDER — INSULIN REGULAR BOLUS VIA INFUSION
0.0000 [IU] | Freq: Three times a day (TID) | INTRAVENOUS | Status: DC
  Filled 2017-04-10: qty 10

## 2017-04-10 MED ORDER — MIDAZOLAM HCL 2 MG/2ML IJ SOLN
2.0000 mg | INTRAMUSCULAR | Status: DC | PRN
  Administered 2017-04-10: 2 mg via INTRAVENOUS
  Filled 2017-04-10: qty 2

## 2017-04-10 MED ORDER — SODIUM CHLORIDE 0.9% FLUSH: 3.0000 mL | INTRAVENOUS | Status: DC | PRN

## 2017-04-10 MED ORDER — SODIUM CHLORIDE 0.9 % IV SOLN: 250.0000 mL | INTRAVENOUS | Status: DC | PRN

## 2017-04-10 MED ORDER — MUPIROCIN 2 % EX OINT
1.0000 "application " | TOPICAL_OINTMENT | Freq: Two times a day (BID) | CUTANEOUS | Status: AC
  Administered 2017-04-10 – 2017-04-15 (×10): 1 via NASAL
  Filled 2017-04-10 (×3): qty 22

## 2017-04-10 MED ORDER — MORPHINE SULFATE (PF) 2 MG/ML IV SOLN
1.0000 mg | INTRAVENOUS | Status: DC | PRN
  Administered 2017-04-10 (×3): 2 mg via INTRAVENOUS
  Filled 2017-04-10 (×2): qty 1

## 2017-04-10 MED ORDER — ROCURONIUM BROMIDE 100 MG/10ML IV SOLN
INTRAVENOUS | Status: DC | PRN
  Administered 2017-04-10 (×2): 50 mg via INTRAVENOUS
  Administered 2017-04-10: 30 mg via INTRAVENOUS
  Administered 2017-04-10 (×4): 50 mg via INTRAVENOUS

## 2017-04-10 MED ORDER — ASPIRIN EC 325 MG PO TBEC
325.0000 mg | DELAYED_RELEASE_TABLET | Freq: Every day | ORAL | Status: DC
  Administered 2017-04-11 – 2017-04-15 (×5): 325 mg via ORAL
  Filled 2017-04-10 (×5): qty 1

## 2017-04-10 MED ORDER — LACTATED RINGERS IV SOLN
INTRAVENOUS | Status: DC | PRN
  Administered 2017-04-10: 07:00:00 via INTRAVENOUS

## 2017-04-10 MED ORDER — SODIUM CHLORIDE 0.9 % WEIGHT BASED INFUSION: 3.0000 mL/kg/h | INTRAVENOUS | Status: DC

## 2017-04-10 MED ORDER — HEPARIN SODIUM (PORCINE) 1000 UNIT/ML IJ SOLN
INTRAMUSCULAR | Status: DC | PRN
  Administered 2017-04-10: 3000 [IU] via INTRAVENOUS
  Administered 2017-04-10: 27000 [IU] via INTRAVENOUS
  Administered 2017-04-10: 2000 [IU] via INTRAVENOUS

## 2017-04-10 MED ORDER — ALBUMIN HUMAN 5 % IV SOLN
INTRAVENOUS | Status: DC | PRN
  Administered 2017-04-10 (×2): via INTRAVENOUS

## 2017-04-10 MED ORDER — MILRINONE LACTATE IN DEXTROSE 20-5 MG/100ML-% IV SOLN
0.1250 ug/kg/min | INTRAVENOUS | Status: DC
  Administered 2017-04-11: 0.125 ug/kg/min via INTRAVENOUS
  Administered 2017-04-11: 0.25 ug/kg/min via INTRAVENOUS
  Filled 2017-04-10 (×2): qty 100

## 2017-04-10 MED ORDER — MUPIROCIN 2 % EX OINT
1.0000 "application " | TOPICAL_OINTMENT | Freq: Once | CUTANEOUS | Status: AC
  Administered 2017-04-10: 1 via TOPICAL
  Filled 2017-04-10: qty 22

## 2017-04-10 MED ORDER — SODIUM CHLORIDE 0.9 % IV SOLN
INTRAVENOUS | Status: DC
  Filled 2017-04-10 (×2): qty 2.5

## 2017-04-10 MED ORDER — PHENYLEPHRINE HCL 10 MG/ML IJ SOLN
0.0000 ug/min | INTRAMUSCULAR | Status: DC
  Filled 2017-04-10: qty 2

## 2017-04-10 MED ORDER — DEXTROSE 5 % IV SOLN
1.5000 g | Freq: Two times a day (BID) | INTRAVENOUS | Status: AC
  Administered 2017-04-10 – 2017-04-12 (×4): 1.5 g via INTRAVENOUS
  Filled 2017-04-10 (×4): qty 1.5

## 2017-04-10 MED ORDER — MILRINONE LACTATE IN DEXTROSE 20-5 MG/100ML-% IV SOLN
0.1250 ug/kg/min | INTRAVENOUS | Status: AC
  Administered 2017-04-10: 0.25 ug/kg/min via INTRAVENOUS
  Filled 2017-04-10: qty 100

## 2017-04-10 MED ORDER — LIDOCAINE 2% (20 MG/ML) 5 ML SYRINGE
INTRAMUSCULAR | Status: AC
  Filled 2017-04-10: qty 5

## 2017-04-10 MED ORDER — METOPROLOL TARTRATE 5 MG/5ML IV SOLN: 2.5000 mg | INTRAVENOUS | Status: DC | PRN

## 2017-04-10 MED ORDER — BISACODYL 5 MG PO TBEC
10.0000 mg | DELAYED_RELEASE_TABLET | Freq: Every day | ORAL | Status: DC
  Administered 2017-04-11 – 2017-04-14 (×4): 10 mg via ORAL
  Filled 2017-04-10 (×5): qty 2

## 2017-04-10 MED ORDER — ASPIRIN 81 MG PO CHEW: 81.0000 mg | CHEWABLE_TABLET | ORAL | Status: DC

## 2017-04-10 MED ORDER — OXYCODONE HCL 5 MG PO TABS
5.0000 mg | ORAL_TABLET | ORAL | Status: DC | PRN
  Administered 2017-04-11 – 2017-04-15 (×15): 10 mg via ORAL
  Filled 2017-04-10 (×16): qty 2

## 2017-04-10 MED ORDER — FENTANYL CITRATE (PF) 250 MCG/5ML IJ SOLN
INTRAMUSCULAR | Status: AC
  Filled 2017-04-10: qty 5

## 2017-04-10 MED ORDER — HEMOSTATIC AGENTS (NO CHARGE) OPTIME
TOPICAL | Status: DC | PRN
  Administered 2017-04-10 (×2): 1 via TOPICAL

## 2017-04-10 MED ORDER — SODIUM CHLORIDE 0.9 % IV SOLN: INTRAVENOUS | Status: DC

## 2017-04-10 MED ORDER — FAMOTIDINE IN NACL 20-0.9 MG/50ML-% IV SOLN: 20.0000 mg | Freq: Two times a day (BID) | INTRAVENOUS | Status: AC

## 2017-04-10 MED ORDER — LACTATED RINGERS IV SOLN
INTRAVENOUS | Status: DC | PRN
  Administered 2017-04-10: 08:00:00 via INTRAVENOUS

## 2017-04-10 MED ORDER — EPINEPHRINE PF 1 MG/10ML IJ SOSY
PREFILLED_SYRINGE | INTRAMUSCULAR | Status: DC | PRN
  Administered 2017-04-10: 50 ug via INTRAVENOUS
  Administered 2017-04-10: 100 ug via INTRAVENOUS

## 2017-04-10 MED ORDER — VANCOMYCIN HCL IN DEXTROSE 1-5 GM/200ML-% IV SOLN
1000.0000 mg | Freq: Once | INTRAVENOUS | Status: DC
  Filled 2017-04-10: qty 200

## 2017-04-10 MED ORDER — METOPROLOL TARTRATE 12.5 MG HALF TABLET: 12.5000 mg | ORAL_TABLET | Freq: Two times a day (BID) | ORAL | Status: DC

## 2017-04-10 MED ORDER — ACETAMINOPHEN 500 MG PO TABS
1000.0000 mg | ORAL_TABLET | Freq: Four times a day (QID) | ORAL | Status: DC
  Administered 2017-04-10 – 2017-04-15 (×17): 1000 mg via ORAL
  Filled 2017-04-10 (×18): qty 2

## 2017-04-10 MED ORDER — SODIUM CHLORIDE 0.9 % IV SOLN: 10.0000 mL/h | Freq: Once | INTRAVENOUS | Status: DC

## 2017-04-10 MED ORDER — CHLORHEXIDINE GLUCONATE 4 % EX LIQD: 30.0000 mL | CUTANEOUS | Status: DC

## 2017-04-10 MED ORDER — SODIUM CHLORIDE 0.9% FLUSH
3.0000 mL | Freq: Two times a day (BID) | INTRAVENOUS | Status: DC
  Administered 2017-04-11 – 2017-04-12 (×4): 3 mL via INTRAVENOUS

## 2017-04-10 MED ORDER — SODIUM CHLORIDE 0.9 % WEIGHT BASED INFUSION: 1.0000 mL/kg/h | INTRAVENOUS | Status: DC

## 2017-04-10 MED ORDER — PANTOPRAZOLE SODIUM 40 MG PO TBEC
40.0000 mg | DELAYED_RELEASE_TABLET | Freq: Every day | ORAL | Status: DC
  Administered 2017-04-12 – 2017-04-15 (×4): 40 mg via ORAL
  Filled 2017-04-10 (×4): qty 1

## 2017-04-10 MED ORDER — NOREPINEPHRINE BITARTRATE 1 MG/ML IV SOLN
0.0000 ug/min | INTRAVENOUS | Status: AC
  Administered 2017-04-10: 2 ug/min via INTRAVENOUS
  Filled 2017-04-10: qty 4

## 2017-04-10 MED ORDER — CHLORHEXIDINE GLUCONATE CLOTH 2 % EX PADS
6.0000 | MEDICATED_PAD | Freq: Every day | CUTANEOUS | Status: DC
  Administered 2017-04-11 – 2017-04-14 (×4): 6 via TOPICAL

## 2017-04-10 MED ORDER — NITROGLYCERIN IN D5W 200-5 MCG/ML-% IV SOLN: 0.0000 ug/min | INTRAVENOUS | Status: DC

## 2017-04-10 MED ORDER — ALBUMIN HUMAN 5 % IV SOLN
250.0000 mL | INTRAVENOUS | Status: AC | PRN
  Administered 2017-04-10 (×4): 250 mL via INTRAVENOUS
  Filled 2017-04-10 (×2): qty 250

## 2017-04-10 MED ORDER — ORAL CARE MOUTH RINSE
15.0000 mL | Freq: Two times a day (BID) | OROMUCOSAL | Status: DC
  Administered 2017-04-10 – 2017-04-14 (×7): 15 mL via OROMUCOSAL

## 2017-04-10 MED ORDER — BISACODYL 10 MG RE SUPP: 10.0000 mg | Freq: Every day | RECTAL | Status: DC

## 2017-04-10 MED ORDER — TRAMADOL HCL 50 MG PO TABS
50.0000 mg | ORAL_TABLET | ORAL | Status: DC | PRN
  Administered 2017-04-11: 100 mg via ORAL
  Administered 2017-04-11: 50 mg via ORAL
  Administered 2017-04-11: 100 mg via ORAL
  Filled 2017-04-10: qty 1
  Filled 2017-04-10 (×2): qty 2

## 2017-04-10 MED ORDER — 0.9 % SODIUM CHLORIDE (POUR BTL) OPTIME
TOPICAL | Status: DC | PRN
  Administered 2017-04-10: 6000 mL

## 2017-04-10 MED ORDER — FENTANYL CITRATE (PF) 250 MCG/5ML IJ SOLN
INTRAMUSCULAR | Status: AC
  Filled 2017-04-10: qty 25

## 2017-04-10 MED ORDER — SODIUM CHLORIDE 0.9% FLUSH
10.0000 mL | Freq: Two times a day (BID) | INTRAVENOUS | Status: DC
  Administered 2017-04-11 – 2017-04-12 (×4): 10 mL

## 2017-04-10 MED ORDER — SODIUM CHLORIDE 0.9% FLUSH: 10.0000 mL | INTRAVENOUS | Status: DC | PRN

## 2017-04-10 MED ORDER — NITROPRUSSIDE SODIUM 25 MG/ML IV SOLN
0.0000 ug/kg/min | INTRAVENOUS | Status: DC
  Filled 2017-04-10: qty 2

## 2017-04-10 MED ORDER — SODIUM CHLORIDE 0.9 % IV SOLN: 250.0000 mL | INTRAVENOUS | Status: DC

## 2017-04-10 MED ORDER — MORPHINE SULFATE (PF) 2 MG/ML IV SOLN
2.0000 mg | INTRAVENOUS | Status: DC | PRN
  Administered 2017-04-11: 2 mg via INTRAVENOUS
  Administered 2017-04-11: 4 mg via INTRAVENOUS
  Administered 2017-04-11: 2 mg via INTRAVENOUS
  Administered 2017-04-11: 4 mg via INTRAVENOUS
  Administered 2017-04-11 (×2): 2 mg via INTRAVENOUS
  Filled 2017-04-10 (×3): qty 1
  Filled 2017-04-10: qty 2
  Filled 2017-04-10 (×3): qty 1

## 2017-04-10 MED ORDER — FENTANYL CITRATE (PF) 250 MCG/5ML IJ SOLN
INTRAMUSCULAR | Status: DC | PRN
  Administered 2017-04-10: 175 ug via INTRAVENOUS
  Administered 2017-04-10: 50 ug via INTRAVENOUS
  Administered 2017-04-10: 100 ug via INTRAVENOUS
  Administered 2017-04-10: 150 ug via INTRAVENOUS
  Administered 2017-04-10: 100 ug via INTRAVENOUS
  Administered 2017-04-10: 25 ug via INTRAVENOUS
  Administered 2017-04-10: 250 ug via INTRAVENOUS
  Administered 2017-04-10: 100 ug via INTRAVENOUS
  Administered 2017-04-10: 250 ug via INTRAVENOUS
  Administered 2017-04-10: 100 ug via INTRAVENOUS
  Administered 2017-04-10: 250 ug via INTRAVENOUS
  Administered 2017-04-10: 150 ug via INTRAVENOUS
  Administered 2017-04-10: 50 ug via INTRAVENOUS

## 2017-04-10 MED ORDER — DIPHENHYDRAMINE HCL 50 MG/ML IJ SOLN
INTRAMUSCULAR | Status: DC | PRN
  Administered 2017-04-10 (×2): 25 mg via INTRAVENOUS

## 2017-04-10 MED ORDER — HYDROCORTISONE NA SUCCINATE PF 100 MG IJ SOLR
INTRAMUSCULAR | Status: DC | PRN
  Administered 2017-04-10: 125 mg via INTRAVENOUS

## 2017-04-10 MED ORDER — ASPIRIN 81 MG PO CHEW: 324.0000 mg | CHEWABLE_TABLET | Freq: Every day | ORAL | Status: DC

## 2017-04-10 MED ORDER — VANCOMYCIN HCL IN DEXTROSE 1-5 GM/200ML-% IV SOLN
1000.0000 mg | Freq: Two times a day (BID) | INTRAVENOUS | Status: AC
  Administered 2017-04-10 – 2017-04-11 (×2): 1000 mg via INTRAVENOUS
  Filled 2017-04-10 (×2): qty 200

## 2017-04-10 MED ORDER — DEXMEDETOMIDINE HCL IN NACL 400 MCG/100ML IV SOLN
0.1000 ug/kg/h | INTRAVENOUS | Status: DC
  Filled 2017-04-10: qty 100

## 2017-04-10 MED ORDER — METOCLOPRAMIDE HCL 5 MG/ML IJ SOLN
10.0000 mg | Freq: Four times a day (QID) | INTRAMUSCULAR | Status: DC
  Administered 2017-04-10 – 2017-04-15 (×19): 10 mg via INTRAVENOUS
  Filled 2017-04-10 (×18): qty 2

## 2017-04-10 MED ORDER — MIDAZOLAM HCL 10 MG/2ML IJ SOLN
INTRAMUSCULAR | Status: AC
  Filled 2017-04-10: qty 2

## 2017-04-10 MED ORDER — DOCUSATE SODIUM 100 MG PO CAPS
200.0000 mg | ORAL_CAPSULE | Freq: Every day | ORAL | Status: DC
  Administered 2017-04-11 – 2017-04-14 (×4): 200 mg via ORAL
  Filled 2017-04-10 (×5): qty 2

## 2017-04-10 MED ORDER — SODIUM CHLORIDE 0.9 % IJ SOLN
OROMUCOSAL | Status: DC | PRN
  Administered 2017-04-10 (×3): via TOPICAL

## 2017-04-10 MED ORDER — LACTATED RINGERS IV SOLN: 500.0000 mL | Freq: Once | INTRAVENOUS | Status: DC | PRN

## 2017-04-10 MED ORDER — SODIUM BICARBONATE 4.2 % IV SOLN
50.0000 meq | Freq: Once | INTRAVENOUS | Status: DC
  Filled 2017-04-10: qty 100

## 2017-04-10 MED ORDER — SODIUM CHLORIDE 0.9 % IV SOLN
30.0000 meq | Freq: Once | INTRAVENOUS | Status: DC
  Administered 2017-04-13: 30 meq via INTRAVENOUS
  Filled 2017-04-10: qty 15

## 2017-04-10 MED ORDER — ACETAMINOPHEN 650 MG RE SUPP
650.0000 mg | Freq: Once | RECTAL | Status: AC
  Administered 2017-04-10: 650 mg via RECTAL

## 2017-04-10 MED ORDER — HEPARIN SODIUM (PORCINE) 1000 UNIT/ML IJ SOLN
INTRAMUSCULAR | Status: AC
  Filled 2017-04-10: qty 1

## 2017-04-10 MED ORDER — LIDOCAINE HCL (CARDIAC) 20 MG/ML IV SOLN
INTRAVENOUS | Status: DC | PRN
  Administered 2017-04-10: 60 mg via INTRAVENOUS

## 2017-04-10 MED ORDER — VASOPRESSIN 20 UNIT/ML IV SOLN
0.0300 [IU]/min | INTRAVENOUS | Status: DC
  Filled 2017-04-10: qty 2

## 2017-04-10 MED ORDER — MORPHINE SULFATE (PF) 4 MG/ML IV SOLN: 1.0000 mg | INTRAVENOUS | Status: DC | PRN

## 2017-04-10 MED ORDER — PROPOFOL 10 MG/ML IV BOLUS
INTRAVENOUS | Status: AC
  Filled 2017-04-10: qty 20

## 2017-04-10 MED ORDER — CHLORHEXIDINE GLUCONATE CLOTH 2 % EX PADS
6.0000 | MEDICATED_PAD | Freq: Every day | CUTANEOUS | Status: DC
  Administered 2017-04-12 – 2017-04-15 (×3): 6 via TOPICAL

## 2017-04-10 MED ORDER — ONDANSETRON HCL 4 MG/2ML IJ SOLN: 4.0000 mg | Freq: Four times a day (QID) | INTRAMUSCULAR | Status: DC | PRN

## 2017-04-10 MED ORDER — MAGNESIUM SULFATE 4 GM/100ML IV SOLN
4.0000 g | Freq: Once | INTRAVENOUS | Status: AC
  Administered 2017-04-10: 4 g via INTRAVENOUS
  Filled 2017-04-10: qty 100

## 2017-04-10 MED ORDER — CHLORHEXIDINE GLUCONATE 0.12 % MT SOLN
15.0000 mL | OROMUCOSAL | Status: AC
  Administered 2017-04-10: 15 mL via OROMUCOSAL
  Filled 2017-04-10: qty 15

## 2017-04-10 MED ORDER — SODIUM BICARBONATE 8.4 % IV SOLN
50.0000 meq | Freq: Once | INTRAVENOUS | Status: AC
  Administered 2017-04-10: 50 meq via INTRAVENOUS

## 2017-04-10 MED ORDER — SODIUM CHLORIDE 0.9% FLUSH: 3.0000 mL | Freq: Two times a day (BID) | INTRAVENOUS | Status: DC

## 2017-04-10 MED FILL — Magnesium Sulfate Inj 50%: INTRAMUSCULAR | Qty: 10 | Status: AC

## 2017-04-10 MED FILL — Heparin Sodium (Porcine) Inj 1000 Unit/ML: INTRAMUSCULAR | Qty: 30 | Status: AC

## 2017-04-10 MED FILL — Potassium Chloride Inj 2 mEq/ML: INTRAVENOUS | Qty: 40 | Status: AC

## 2017-04-10 SURGICAL SUPPLY — 107 items
ADAPTER CARDIO PERF ANTE/RETRO (ADAPTER) ×4 IMPLANT
BAG DECANTER FOR FLEXI CONT (MISCELLANEOUS) ×4 IMPLANT
BANDAGE ACE 4X5 VEL STRL LF (GAUZE/BANDAGES/DRESSINGS) ×4 IMPLANT
BANDAGE ACE 6X5 VEL STRL LF (GAUZE/BANDAGES/DRESSINGS) ×4 IMPLANT
BASKET HEART  (ORDER IN 25'S) (MISCELLANEOUS) ×1
BASKET HEART (ORDER IN 25'S) (MISCELLANEOUS) ×1
BASKET HEART (ORDER IN 25S) (MISCELLANEOUS) ×2 IMPLANT
BENZOIN TINCTURE PRP APPL 2/3 (GAUZE/BANDAGES/DRESSINGS) ×4 IMPLANT
BLADE CLIPPER SURG (BLADE) ×4 IMPLANT
BLADE STERNUM SYSTEM 6 (BLADE) ×4 IMPLANT
BLADE SURG 12 STRL SS (BLADE) ×4 IMPLANT
BLANKET WARM CARDIAC ADLT BAIR (MISCELLANEOUS) ×4 IMPLANT
BNDG GAUZE ELAST 4 BULKY (GAUZE/BANDAGES/DRESSINGS) ×4 IMPLANT
CANISTER SUCT 3000ML PPV (MISCELLANEOUS) ×4 IMPLANT
CANNULA ARTERIAL NVNT 3/8 22FR (MISCELLANEOUS) ×4 IMPLANT
CANNULA GUNDRY RCSP 15FR (MISCELLANEOUS) ×4 IMPLANT
CATH CPB KIT VANTRIGT (MISCELLANEOUS) ×4 IMPLANT
CATH ROBINSON RED A/P 18FR (CATHETERS) ×12 IMPLANT
CATH THORACIC 36FR RT ANG (CATHETERS) ×4 IMPLANT
CHLORAPREP W/TINT 10.5 ML (MISCELLANEOUS) ×4 IMPLANT
CLIP RETRACTION 3.0MM CORONARY (MISCELLANEOUS) ×8 IMPLANT
CLIP TI WIDE RED SMALL 24 (CLIP) ×4 IMPLANT
CRADLE DONUT ADULT HEAD (MISCELLANEOUS) ×4 IMPLANT
DRAIN CHANNEL 32F RND 10.7 FF (WOUND CARE) ×4 IMPLANT
DRAPE CARDIOVASCULAR INCISE (DRAPES) ×2
DRAPE SLUSH/WARMER DISC (DRAPES) ×4 IMPLANT
DRAPE SRG 135X102X78XABS (DRAPES) ×2 IMPLANT
DRSG AQUACEL AG ADV 3.5X14 (GAUZE/BANDAGES/DRESSINGS) ×4 IMPLANT
DRSG CUTIMED SORBACT 7X9 (GAUZE/BANDAGES/DRESSINGS) ×4 IMPLANT
ELECT BLADE 4.0 EZ CLEAN MEGAD (MISCELLANEOUS) ×4
ELECT BLADE 6.5 EXT (BLADE) ×4 IMPLANT
ELECT CAUTERY BLADE 6.4 (BLADE) ×4 IMPLANT
ELECT REM PT RETURN 9FT ADLT (ELECTROSURGICAL) ×8
ELECTRODE BLDE 4.0 EZ CLN MEGD (MISCELLANEOUS) ×2 IMPLANT
ELECTRODE REM PT RTRN 9FT ADLT (ELECTROSURGICAL) ×4 IMPLANT
FELT TEFLON 1X6 (MISCELLANEOUS) ×8 IMPLANT
GAUZE SPONGE 4X4 12PLY STRL (GAUZE/BANDAGES/DRESSINGS) ×8 IMPLANT
GAUZE SPONGE 4X4 12PLY STRL LF (GAUZE/BANDAGES/DRESSINGS) ×8 IMPLANT
GLOVE BIO SURGEON STRL SZ 6.5 (GLOVE) ×3 IMPLANT
GLOVE BIO SURGEON STRL SZ7 (GLOVE) ×40 IMPLANT
GLOVE BIO SURGEON STRL SZ7.5 (GLOVE) ×20 IMPLANT
GLOVE BIO SURGEONS STRL SZ 6.5 (GLOVE) ×1
GLOVE BIOGEL PI IND STRL 6 (GLOVE) ×10 IMPLANT
GLOVE BIOGEL PI INDICATOR 6 (GLOVE) ×10
GOWN STRL REUS W/ TWL LRG LVL3 (GOWN DISPOSABLE) ×20 IMPLANT
GOWN STRL REUS W/TWL LRG LVL3 (GOWN DISPOSABLE) ×20
HEMOSTAT POWDER SURGIFOAM 1G (HEMOSTASIS) ×12 IMPLANT
HEMOSTAT SURGICEL 2X14 (HEMOSTASIS) ×4 IMPLANT
INSERT FOGARTY XLG (MISCELLANEOUS) IMPLANT
KIT BASIN OR (CUSTOM PROCEDURE TRAY) ×4 IMPLANT
KIT ROOM TURNOVER OR (KITS) ×4 IMPLANT
KIT SUCTION CATH 14FR (SUCTIONS) ×4 IMPLANT
KIT VASOVIEW HEMOPRO VH 3000 (KITS) ×4 IMPLANT
LEAD PACING MYOCARDI (MISCELLANEOUS) ×4 IMPLANT
MARKER GRAFT CORONARY BYPASS (MISCELLANEOUS) ×12 IMPLANT
NS IRRIG 1000ML POUR BTL (IV SOLUTION) ×24 IMPLANT
PACK OPEN HEART (CUSTOM PROCEDURE TRAY) ×4 IMPLANT
PAD ARMBOARD 7.5X6 YLW CONV (MISCELLANEOUS) ×8 IMPLANT
PAD ELECT DEFIB RADIOL ZOLL (MISCELLANEOUS) ×4 IMPLANT
PENCIL BUTTON HOLSTER BLD 10FT (ELECTRODE) ×4 IMPLANT
POWDER SURGICEL 3.0 GRAM (HEMOSTASIS) ×4 IMPLANT
PUNCH AORTIC ROTATE  4.5MM 8IN (MISCELLANEOUS) ×4 IMPLANT
PUNCH AORTIC ROTATE 4.0MM (MISCELLANEOUS) IMPLANT
PUNCH AORTIC ROTATE 4.5MM 8IN (MISCELLANEOUS) IMPLANT
PUNCH AORTIC ROTATE 5MM 8IN (MISCELLANEOUS) IMPLANT
SET CARDIOPLEGIA MPS 5001102 (MISCELLANEOUS) ×4 IMPLANT
SPOGE SURGIFLO 8M (HEMOSTASIS) ×2
SPONGE LAP 18X18 X RAY DECT (DISPOSABLE) ×8 IMPLANT
SPONGE LAP 4X18 X RAY DECT (DISPOSABLE) ×8 IMPLANT
SPONGE SURGIFLO 8M (HEMOSTASIS) ×2 IMPLANT
SURGIFLO W/THROMBIN 8M KIT (HEMOSTASIS) ×4 IMPLANT
SUT BONE WAX W31G (SUTURE) ×4 IMPLANT
SUT MNCRL AB 4-0 PS2 18 (SUTURE) IMPLANT
SUT PROLENE 3 0 SH DA (SUTURE) IMPLANT
SUT PROLENE 3 0 SH1 36 (SUTURE) IMPLANT
SUT PROLENE 4 0 RB 1 (SUTURE) ×2
SUT PROLENE 4 0 SH DA (SUTURE) ×4 IMPLANT
SUT PROLENE 4-0 RB1 .5 CRCL 36 (SUTURE) ×2 IMPLANT
SUT PROLENE 5 0 C 1 36 (SUTURE) IMPLANT
SUT PROLENE 6 0 C 1 30 (SUTURE) ×24 IMPLANT
SUT PROLENE 6 0 CC (SUTURE) ×12 IMPLANT
SUT PROLENE 8 0 BV175 6 (SUTURE) ×16 IMPLANT
SUT PROLENE BLUE 7 0 (SUTURE) ×20 IMPLANT
SUT SILK  1 MH (SUTURE)
SUT SILK 1 MH (SUTURE) IMPLANT
SUT SILK 2 0 SH CR/8 (SUTURE) IMPLANT
SUT SILK 3 0 SH CR/8 (SUTURE) IMPLANT
SUT STEEL 6MS V (SUTURE) ×8 IMPLANT
SUT STEEL SZ 6 DBL 3X14 BALL (SUTURE) ×4 IMPLANT
SUT VIC AB 1 CTX 36 (SUTURE) ×4
SUT VIC AB 1 CTX36XBRD ANBCTR (SUTURE) ×4 IMPLANT
SUT VIC AB 2-0 CT1 27 (SUTURE) ×4
SUT VIC AB 2-0 CT1 TAPERPNT 27 (SUTURE) ×4 IMPLANT
SUT VIC AB 2-0 CTX 27 (SUTURE) IMPLANT
SUT VIC AB 3-0 X1 27 (SUTURE) ×4 IMPLANT
SUTURE E-PAK OPEN HEART (SUTURE) ×4 IMPLANT
SYSTEM SAHARA CHEST DRAIN ATS (WOUND CARE) ×4 IMPLANT
TAPE CLOTH SURG 4X10 WHT LF (GAUZE/BANDAGES/DRESSINGS) ×4 IMPLANT
TAPE PAPER 2X10 WHT MICROPORE (GAUZE/BANDAGES/DRESSINGS) ×4 IMPLANT
TOWEL GREEN STERILE (TOWEL DISPOSABLE) ×16 IMPLANT
TOWEL GREEN STERILE FF (TOWEL DISPOSABLE) ×8 IMPLANT
TOWEL OR 17X24 6PK STRL BLUE (TOWEL DISPOSABLE) ×8 IMPLANT
TOWEL OR 17X26 10 PK STRL BLUE (TOWEL DISPOSABLE) ×8 IMPLANT
TRAY FOLEY SILVER 16FR TEMP (SET/KITS/TRAYS/PACK) ×4 IMPLANT
TUBING INSUFFLATION (TUBING) ×4 IMPLANT
UNDERPAD 30X30 (UNDERPADS AND DIAPERS) ×4 IMPLANT
WATER STERILE IRR 1000ML POUR (IV SOLUTION) ×8 IMPLANT

## 2017-04-10 NOTE — Anesthesia Procedure Notes (Signed)
Central Venous Catheter Insertion Performed by: Roberts Gaudy, anesthesiologist Start/End4/24/2018 6:45 AM, 04/10/2017 6:50 AM Preanesthetic checklist: patient identified, IV checked, site marked, risks and benefits discussed, surgical consent, monitors and equipment checked, pre-op evaluation and timeout performed Position: supine Catheter size: 9 Fr PA cath was placed.[place]wan type:thermodilution Procedure performed using ultrasound guided technique. Ultrasound Notes:anatomy identified, needle tip was noted to be adjacent to the nerve/plexus identified and no ultrasound evidence of intravascular and/or intraneural injection Attempts: 1 Following insertion, line sutured, dressing applied and Biopatch. Post procedure assessment: blood return through all ports  Patient tolerated the procedure well with no immediate complications.

## 2017-04-10 NOTE — Progress Notes (Signed)
  Echocardiogram Echocardiogram Transesophageal has been performed.  Kenneth Duarte M 04/10/2017, 10:27 AM

## 2017-04-10 NOTE — Procedures (Signed)
Extubation Procedure Note  Patient Details:   Name: Kenneth Duarte DOB: 01/24/1946 MRN: [id number]   Airway Documentation:     Evaluation  O2 sats: stable throughout Complications: No apparent complications Patient did tolerate procedure well. Bilateral Breath Sounds: Clear, Diminished   Patient extubated to 4 L [place]. No distress noted. Cuff leak present prior to extubation. Patient able to speak and is alert and oriented.   Mali M Fardowsa Authier 04/10/2017, 6:19 PM

## 2017-04-10 NOTE — Brief Op Note (Signed)
04/10/2017  12:06 PM  PATIENT:  [name]  71 y.o. male  PRE-OPERATIVE DIAGNOSIS:  CAD  POST-OPERATIVE DIAGNOSIS:  CAD  PROCEDURE:  Procedure(s): CORONARY ARTERY BYPASS GRAFTING (CABG) x , using left internal mammary artery and right leg greater saphenous vein harvested endoscopically (N/A) TRANSESOPHAGEAL ECHOCARDIOGRAM (TEE) (N/A) LIMA-LAD SVG-OM SVG-PD  SURGEON:  Surgeon(s) and Role:    * Ivin Poot, MD - Primary  PHYSICIAN ASSISTANT: Lavella Myren PA-C  ANESTHESIA:   general  EBL:  Total I/O In: 1800 [I.V.:1800] Out: 1175 [Urine:1175]  BLOOD ADMINISTERED:2 FFP  DRAINS: ROUTINE   LOCAL MEDICATIONS USED:  NONE  SPECIMEN:  No Specimen  DISPOSITION OF SPECIMEN:  N/A  COUNTS:  YES  TOURNIQUET:  * No tourniquets in log *  DICTATION: .Other Dictation: Dictation Number PENDING  PLAN OF CARE: Admit to inpatient   PATIENT DISPOSITION:  ICU - intubated and hemodynamically stable.   Delay start of Pharmacological VTE agent (>24hrs) due to surgical blood loss or risk of bleeding: yes  COMPLICATION: NO KNOWN

## 2017-04-10 NOTE — Progress Notes (Signed)
The patient was examined and preop studies reviewed. There has been no change from the prior exam and the patient is ready for surgery.  Plan CABG on D [place]

## 2017-04-10 NOTE — Anesthesia Preprocedure Evaluation (Signed)
Anesthesia Evaluation  Patient identified by MRN, date of birth, ID band Patient awake    Reviewed: Allergy & Precautions, NPO status , Patient's Chart, lab work & pertinent test results  Airway Mallampati: II   Neck ROM: full    Dental   Pulmonary shortness of breath, former smoker,    breath sounds clear to auscultation       Cardiovascular hypertension, + angina + CAD, + Peripheral Vascular Disease and +CHF   Rhythm:regular Rate:Normal  2v CAD. EF 55%   Neuro/Psych  Headaches,  Neuromuscular disease    GI/Hepatic GERD  ,  Endo/Other  diabetes, Type 2  Renal/GU      Musculoskeletal  (+) Arthritis ,   Abdominal   Peds  Hematology   Anesthesia Other Findings   Reproductive/Obstetrics                             Anesthesia Physical Anesthesia Plan  ASA: III  Anesthesia Plan: General   Post-op Pain Management:    Induction: Intravenous  Airway Management Planned: Oral ETT  Additional Equipment: Arterial line, CVP, PA Cath and TEE  Intra-op Plan:   Post-operative Plan: Post-operative intubation/ventilation  Informed Consent: I have reviewed the patients History and Physical, chart, labs and discussed the procedure including the risks, benefits and alternatives for the proposed anesthesia with the patient or authorized representative who has indicated his/her understanding and acceptance.     Plan Discussed with: CRNA, Anesthesiologist and Surgeon  Anesthesia Plan Comments:         Anesthesia Quick Evaluation

## 2017-04-10 NOTE — Progress Notes (Signed)
Dr. Prescott Gum at bedside, gave verbal order for patient to receive one amp of bicarb for ABG.   Rowe Pavy, RN

## 2017-04-10 NOTE — Progress Notes (Signed)
Patient ID: Kenneth Duarte, male   DOB: [birth year], 71 y.o.   MRN: [id number] EVENING ROUNDS NOTE :     [address]       [place] [address]             [phone]                 Day of Surgery Procedure(s) (LRB): CORONARY ARTERY BYPASS GRAFTING (CABG) x three , using left internal mammary artery and right leg greater saphenous vein harvested endoscopically (N/A) TRANSESOPHAGEAL ECHOCARDIOGRAM (TEE) (N/A)  Total Length of Stay:  LOS: 0 days  BP (!) 89/57   Pulse 89   Temp (!) 100.6 F (38.1 C)   Resp 16   Ht 5\' 11"  (1.803 m)   Wt 201 lb (91.2 kg)   SpO2 99%   BMI 28.03 kg/m   .Intake/Output      04/23 0701 - 04/24 0700 04/24 0701 - 04/25 0700   I.V. (mL/kg)  3180.9 (34.9)   Blood  773   IV Piggyback  900   Total Intake(mL/kg)  4853.9 (53.2)   Urine (mL/kg/hr)  2560 (2.4)   Blood  1450 (1.4)   Chest Tube  180 (0.2)   Total Output   4190   Net   +663.9          . sodium chloride 20 mL/hr at 04/10/17 1800  . [START ON 04/11/2017] sodium chloride    . sodium chloride 20 mL/hr at 04/10/17 1800  . sodium chloride    . albumin human    . cefUROXime (ZINACEF)  IV Stopped (04/10/17 1637)  . dexmedetomidine (PRECEDEX) IV infusion 0 mcg/kg/hr (04/10/17 1700)  . famotidine (PEPCID) IV    . insulin (NOVOLIN-R) infusion 1.6 Units/hr (04/10/17 1800)  . lactated ringers    . lactated ringers 20 mL/hr at 04/10/17 1800  . magnesium sulfate 4 g (04/10/17 1441)  . milrinone 0.125 mcg/kg/min (04/10/17 1800)  . nitroGLYCERIN 0 mcg/min (04/10/17 1430)  . nitroPRUSSide 0 mcg/kg/min (04/10/17 1430)  . norepinephrine (LEVOPHED) Adult infusion 2 mcg/min (04/10/17 1800)  . phenylephrine (NEO-SYNEPHRINE) Adult infusion 0 mcg/min (04/10/17 1430)  . potassium chloride (KCL MULTIRUN) 30 mEq in 265 mL IVPB    . vancomycin 1,000 mg (04/10/17 1802)     Lab Results  Component Value Date   WBC 10.3 04/10/2017   HGB 10.6 (L) 04/10/2017   HCT 30.3 (L) 04/10/2017   PLT  138 (L) 04/10/2017   GLUCOSE 145 (H) 04/10/2017   ALT 24 04/09/2017   AST 19 04/09/2017   NA 141 04/10/2017   K 4.3 04/10/2017   CL 102 04/10/2017   CREATININE 0.70 04/10/2017   BUN 10 04/10/2017   CO2 23 04/09/2017   INR 1.37 04/10/2017   HGBA1C 5.8 (H) 04/09/2017   Just extubated, neuro intact Not bleeding  Getting 1 amp bicarbonate  Grace Isaac MD  Beeper [phone] Office [phone] 04/10/2017 6:30 PM

## 2017-04-10 NOTE — Transfer of Care (Signed)
Immediate Anesthesia Transfer of Care Note  Patient: Kenneth Duarte  Procedure(s) Performed: Procedure(s): CORONARY ARTERY BYPASS GRAFTING (CABG) x three , using left internal mammary artery and right leg greater saphenous vein harvested endoscopically (N/A) TRANSESOPHAGEAL ECHOCARDIOGRAM (TEE) (N/A)  Patient Location: SICU  Anesthesia Type:General  Level of Consciousness: sedated and Patient remains intubated per anesthesia plan  Airway & Oxygen Therapy: Patient remains intubated per anesthesia plan and Patient placed on Ventilator (see vital sign flow sheet for setting)  Post-op Assessment: Report given to RN and Post -op Vital signs reviewed and stable  Post vital signs: Reviewed and stable  Last Vitals:  Vitals:   04/10/17 0547  BP: (!) 172/68  Pulse: 79  Resp: 18  Temp: 36.8 C    Last Pain:  Vitals:   04/10/17 0547  TempSrc: Oral      Patients Stated Pain Goal: 2 (28/78/67 6720)  Complications: No apparent anesthesia complications

## 2017-04-10 NOTE — Anesthesia Procedure Notes (Signed)
Procedure Name: Intubation Date/Time: 04/10/2017 7:57 AM Performed by: Laretta Alstrom Pre-anesthesia Checklist: Patient identified, Emergency Drugs available, Suction available, Patient being monitored and Timeout performed Patient Re-evaluated:Patient Re-evaluated prior to inductionOxygen Delivery Method: Circle system utilized Preoxygenation: Pre-oxygenation with 100% oxygen Intubation Type: IV induction Ventilation: Mask ventilation without difficulty and Oral airway inserted - appropriate to patient size Laryngoscope Size: Sabra Heck and 2 Grade View: Grade I Tube type: Oral Tube size: 8.0 mm Number of attempts: 1 Airway Equipment and Method: Stylet Placement Confirmation: ETT inserted through vocal cords under direct vision,  positive ETCO2 and breath sounds checked- equal and bilateral Secured at: 23 cm Tube secured with: Tape Dental Injury: Teeth and Oropharynx as per pre-operative assessment

## 2017-04-10 NOTE — Progress Notes (Signed)
Called and updated wife of extubation.   Rowe Pavy, RN

## 2017-04-11 ENCOUNTER — Encounter (HOSPITAL_COMMUNITY): Payer: Self-pay | Admitting: Cardiothoracic Surgery

## 2017-04-11 ENCOUNTER — Inpatient Hospital Stay (HOSPITAL_COMMUNITY): Payer: Medicare PPO

## 2017-04-11 LAB — GLUCOSE, CAPILLARY
Glucose-Capillary: 110 mg/dL — ABNORMAL HIGH (ref 65–99)
Glucose-Capillary: 110 mg/dL — ABNORMAL HIGH (ref 65–99)
Glucose-Capillary: 119 mg/dL — ABNORMAL HIGH (ref 65–99)
Glucose-Capillary: 130 mg/dL — ABNORMAL HIGH (ref 65–99)
Glucose-Capillary: 130 mg/dL — ABNORMAL HIGH (ref 65–99)
Glucose-Capillary: 143 mg/dL — ABNORMAL HIGH (ref 65–99)
Glucose-Capillary: 144 mg/dL — ABNORMAL HIGH (ref 65–99)
Glucose-Capillary: 150 mg/dL — ABNORMAL HIGH (ref 65–99)
Glucose-Capillary: 151 mg/dL — ABNORMAL HIGH (ref 65–99)
Glucose-Capillary: 154 mg/dL — ABNORMAL HIGH (ref 65–99)
Glucose-Capillary: 160 mg/dL — ABNORMAL HIGH (ref 65–99)
Glucose-Capillary: 172 mg/dL — ABNORMAL HIGH (ref 65–99)
Glucose-Capillary: 58 mg/dL — ABNORMAL LOW (ref 65–99)
Glucose-Capillary: 78 mg/dL (ref 65–99)
Glucose-Capillary: 87 mg/dL (ref 65–99)
Glucose-Capillary: 96 mg/dL (ref 65–99)

## 2017-04-11 LAB — BPAM FFP
Blood Product Expiration Date: 201804272359
Blood Product Expiration Date: 201804292359
Blood Product Expiration Date: 201804292359
Blood Product Expiration Date: 201804292359
ISSUE DATE / TIME: 201804241206
ISSUE DATE / TIME: 201804241206
ISSUE DATE / TIME: 201804241250
ISSUE DATE / TIME: 201804241418
Unit Type and Rh: 6200
Unit Type and Rh: 6200
Unit Type and Rh: 7300
Unit Type and Rh: 7300

## 2017-04-11 LAB — PREPARE FRESH FROZEN PLASMA
Unit division: 0
Unit division: 0
Unit division: 0
Unit division: 0

## 2017-04-11 LAB — CBC
HCT: 27.1 % — ABNORMAL LOW (ref 39.0–52.0)
HCT: 25.5 % — ABNORMAL LOW (ref 39.0–52.0)
HCT: 27.4 % — ABNORMAL LOW (ref 39.0–52.0)
Hemoglobin: 9.5 g/dL — ABNORMAL LOW (ref 13.0–17.0)
Hemoglobin: 8.7 g/dL — ABNORMAL LOW (ref 13.0–17.0)
Hemoglobin: 9.2 g/dL — ABNORMAL LOW (ref 13.0–17.0)
MCH: 30.4 pg (ref 26.0–34.0)
MCH: 29.6 pg (ref 26.0–34.0)
MCH: 29.2 pg (ref 26.0–34.0)
MCHC: 35.1 g/dL (ref 30.0–36.0)
MCHC: 34.1 g/dL (ref 30.0–36.0)
MCHC: 33.6 g/dL (ref 30.0–36.0)
MCV: 86.6 fL (ref 78.0–100.0)
MCV: 86.7 fL (ref 78.0–100.0)
MCV: 87 fL (ref 78.0–100.0)
Platelets: 161 10*3/uL (ref 150–400)
Platelets: 125 10*3/uL — ABNORMAL LOW (ref 150–400)
Platelets: 134 10*3/uL — ABNORMAL LOW (ref 150–400)
RBC: 3.13 MIL/uL — ABNORMAL LOW (ref 4.22–5.81)
RBC: 2.94 MIL/uL — ABNORMAL LOW (ref 4.22–5.81)
RBC: 3.15 MIL/uL — ABNORMAL LOW (ref 4.22–5.81)
RDW: 14.7 % (ref 11.5–15.5)
RDW: 14.5 % (ref 11.5–15.5)
RDW: 14.7 % (ref 11.5–15.5)
WBC: 10.2 10*3/uL (ref 4.0–10.5)
WBC: 8.8 10*3/uL (ref 4.0–10.5)
WBC: 11.4 10*3/uL — ABNORMAL HIGH (ref 4.0–10.5)

## 2017-04-11 LAB — BASIC METABOLIC PANEL
Anion gap: 6 (ref 5–15)
BUN: 8 mg/dL (ref 6–20)
CO2: 25 mmol/L (ref 22–32)
Calcium: 8.1 mg/dL — ABNORMAL LOW (ref 8.9–10.3)
Chloride: 104 mmol/L (ref 101–111)
Creatinine, Ser: 0.79 mg/dL (ref 0.61–1.24)
GFR calc Af Amer: >60 mL/min (ref >60)
GFR calc non Af Amer: >60 mL/min (ref >60)
Glucose, Bld: 125 mg/dL — ABNORMAL HIGH (ref 65–99)
Potassium: 4.2 mmol/L (ref 3.5–5.1)
Sodium: 135 mmol/L (ref 135–145)

## 2017-04-11 LAB — VAS US DOPPLER PRE CABG
LEFT ECA DIAS: -16 cm/s
LEFT VERTEBRAL DIAS: -12 cm/s
Left CCA dist dias: -33 cm/s
Left CCA dist sys: -160 cm/s
Left CCA prox dias: 24 cm/s
Left CCA prox sys: 113 cm/s
Left ICA dist dias: -44 cm/s
Left ICA dist sys: -135 cm/s
Left ICA prox dias: -31 cm/s
Left ICA prox sys: -113 cm/s
RIGHT ECA DIAS: -18 cm/s
RIGHT VERTEBRAL DIAS: 18 cm/s
Right CCA prox dias: 11 cm/s
Right CCA prox sys: 86 cm/s
Right cca dist sys: -112 cm/s

## 2017-04-11 LAB — TRANSFUSION REACTION
DAT C3: NEGATIVE
Post RXN DAT IgG: NEGATIVE

## 2017-04-11 LAB — POCT I-STAT, CHEM 8
BUN: 8 mg/dL (ref 6–20)
Calcium, Ion: 1.21 mmol/L (ref 1.15–1.40)
Chloride: 96 mmol/L — ABNORMAL LOW (ref 101–111)
Creatinine, Ser: 0.8 mg/dL (ref 0.61–1.24)
Glucose, Bld: 162 mg/dL — ABNORMAL HIGH (ref 65–99)
HCT: 27 % — ABNORMAL LOW (ref 39.0–52.0)
Hemoglobin: 9.2 g/dL — ABNORMAL LOW (ref 13.0–17.0)
Potassium: 4.2 mmol/L (ref 3.5–5.1)
Sodium: 135 mmol/L (ref 135–145)
TCO2: 27 mmol/L (ref 0–100)

## 2017-04-11 LAB — CREATININE, SERUM
Creatinine, Ser: 0.84 mg/dL (ref 0.61–1.24)
GFR calc Af Amer: >60 mL/min (ref >60)
GFR calc non Af Amer: >60 mL/min (ref >60)

## 2017-04-11 LAB — MAGNESIUM
Magnesium: 2.2 mg/dL (ref 1.7–2.4)
Magnesium: 1.9 mg/dL (ref 1.7–2.4)

## 2017-04-11 MED ORDER — INSULIN DETEMIR 100 UNIT/ML ~~LOC~~ SOLN
8.0000 [IU] | Freq: Every day | SUBCUTANEOUS | Status: AC
  Administered 2017-04-11: 8 [IU] via SUBCUTANEOUS
  Filled 2017-04-11: qty 0.08

## 2017-04-11 MED ORDER — MORPHINE SULFATE (PF) 4 MG/ML IV SOLN
INTRAVENOUS | Status: AC
  Filled 2017-04-11: qty 1

## 2017-04-11 MED ORDER — INSULIN ASPART 100 UNIT/ML ~~LOC~~ SOLN
0.0000 [IU] | SUBCUTANEOUS | Status: DC
  Administered 2017-04-11: 2 [IU] via SUBCUTANEOUS
  Administered 2017-04-11 – 2017-04-12 (×5): 4 [IU] via SUBCUTANEOUS
  Administered 2017-04-12: 2 [IU] via SUBCUTANEOUS

## 2017-04-11 MED ORDER — METOPROLOL TARTRATE 25 MG PO TABS
25.0000 mg | ORAL_TABLET | Freq: Two times a day (BID) | ORAL | Status: DC
  Administered 2017-04-11 – 2017-04-15 (×9): 25 mg via ORAL
  Filled 2017-04-11 (×9): qty 1

## 2017-04-11 MED ORDER — KETOROLAC TROMETHAMINE 15 MG/ML IJ SOLN
15.0000 mg | Freq: Four times a day (QID) | INTRAMUSCULAR | Status: AC
  Administered 2017-04-11 – 2017-04-12 (×3): 15 mg via INTRAVENOUS
  Filled 2017-04-11 (×3): qty 1

## 2017-04-11 MED ORDER — MORPHINE SULFATE (PF) 2 MG/ML IV SOLN: 2.0000 mg | INTRAVENOUS | Status: DC | PRN

## 2017-04-11 MED ORDER — MORPHINE SULFATE (PF) 4 MG/ML IV SOLN
2.0000 mg | INTRAVENOUS | Status: DC | PRN
  Administered 2017-04-11 – 2017-04-13 (×9): 4 mg via INTRAVENOUS
  Filled 2017-04-11 (×9): qty 1

## 2017-04-11 MED ORDER — ISOSORBIDE MONONITRATE ER 30 MG PO TB24
30.0000 mg | ORAL_TABLET | Freq: Every day | ORAL | Status: DC
  Administered 2017-04-11 – 2017-04-14 (×4): 30 mg via ORAL
  Filled 2017-04-11 (×4): qty 1

## 2017-04-11 MED ORDER — FUROSEMIDE 10 MG/ML IJ SOLN
20.0000 mg | Freq: Two times a day (BID) | INTRAMUSCULAR | Status: DC
  Administered 2017-04-11 – 2017-04-12 (×4): 20 mg via INTRAVENOUS
  Filled 2017-04-11 (×5): qty 2

## 2017-04-11 MED ORDER — DEXTROSE 50 % IV SOLN
1.0000 | Freq: Once | INTRAVENOUS | Status: AC
  Administered 2017-04-11: 17 mL via INTRAVENOUS

## 2017-04-11 MED FILL — Mannitol IV Soln 20%: INTRAVENOUS | Qty: 500 | Status: AC

## 2017-04-11 MED FILL — Sodium Bicarbonate IV Soln 8.4%: INTRAVENOUS | Qty: 50 | Status: AC

## 2017-04-11 MED FILL — Sodium Chloride IV Soln 0.9%: INTRAVENOUS | Qty: 2000 | Status: AC

## 2017-04-11 MED FILL — Heparin Sodium (Porcine) Inj 1000 Unit/ML: INTRAMUSCULAR | Qty: 10 | Status: AC

## 2017-04-11 MED FILL — Lidocaine HCl IV Inj 20 MG/ML: INTRAVENOUS | Qty: 5 | Status: AC

## 2017-04-11 MED FILL — Electrolyte-R (PH 7.4) Solution: INTRAVENOUS | Qty: 5000 | Status: AC

## 2017-04-11 NOTE — Progress Notes (Signed)
1 Day Post-Op Procedure(s) (LRB): CORONARY ARTERY BYPASS GRAFTING (CABG) x three , using left internal mammary artery and right leg greater saphenous vein harvested endoscopically (N/A) TRANSESOPHAGEAL ECHOCARDIOGRAM (TEE) (N/A) Subjective: nsr Stable hemodynamics cxr clear Neuro intact Objective: Vital signs in last 24 hours: Temp:  [97.3 F (36.3 C)-100.6 F (38.1 C)] 99 F (37.2 C) (04/25 0700) Pulse Rate:  [79-95] 93 (04/25 0700) Cardiac Rhythm: Normal sinus rhythm (04/25 0400) Resp:  [12-26] 20 (04/25 0700) BP: (83-151)/(41-84) 139/70 (04/25 0700) SpO2:  [91 %-100 %] 96 % (04/25 0700) Arterial Line BP: (67-187)/(33-87) 164/53 (04/25 0700) FiO2 (%):  [40 %-50 %] 40 % (04/24 1713) Weight:  [206 lb 12.7 oz (93.8 kg)] 206 lb 12.7 oz (93.8 kg) (04/25 0500)  Hemodynamic parameters for last 24 hours: PAP: (21-47)/(4-24) 35/15 CO:  [4.1 L/min-8.3 L/min] 7.5 L/min CI:  [1.9 L/min/m2-3.9 L/min/m2] 3.5 L/min/m2  Intake/Output from previous day: 04/24 0701 - 04/25 0700 In: 6956.5 [I.V.:4083.5; Blood:773; IV ZOXWRUEAV:4098] Out: 1191 [Urine:4010; Blood:1450; Chest Tube:780] Intake/Output this shift: No intake/output data recorded.       Exam    General- alert and comfortable   Lungs- clear without rales, wheezes   Cor- regular rate and rhythm, no murmur , gallop   Abdomen- soft, non-tender   Extremities - warm, non-tender, minimal edema   Neuro- oriented, appropriate, no focal weakness   Lab Results:  Recent Labs  04/10/17 2009 04/11/17 0257  WBC 10.2 8.8  HGB 9.5* 8.7*  HCT 27.1* 25.5*  PLT 161 125*   BMET:  Recent Labs  04/09/17 1548  04/10/17 1957 04/10/17 2009 04/11/17 0257  NA 139  < > 139  --  135  K 4.1  < > 4.4  --  4.2  CL 106  < > 103  --  104  CO2 23  --   --   --  25  GLUCOSE 148*  < > 135*  --  125*  BUN 8  < > 9  --  8  CREATININE 0.86  < > 0.80 0.80 0.79  CALCIUM 9.0  --   --   --  8.1*  < > = values in this interval not displayed.   PT/INR:  Recent Labs  04/10/17 1440  LABPROT 17.0*  INR 1.37   ABG    Component Value Date/Time   PHART 7.363 04/10/2017 1921   HCO3 23.8 04/10/2017 1921   TCO2 28 04/10/2017 1957   ACIDBASEDEF 1.0 04/10/2017 1921   O2SAT 95.0 04/10/2017 1921   CBG (last 3)   Recent Labs  04/11/17 0518 04/11/17 0613 04/11/17 0655  GLUCAP 96 130* 119*    Assessment/Plan: S/P Procedure(s) (LRB): CORONARY ARTERY BYPASS GRAFTING (CABG) x three , using left internal mammary artery and right leg greater saphenous vein harvested endoscopically (N/A) TRANSESOPHAGEAL ECHOCARDIOGRAM (TEE) (N/A) Mobilize Diuresis Diabetes control See progression orders   LOS: 1 day    Tharon Aquas Trigt III 04/11/2017

## 2017-04-11 NOTE — Anesthesia Postprocedure Evaluation (Addendum)
Anesthesia Post Note  Patient: Kenneth Duarte  Procedure(s) Performed: Procedure(s) (LRB): CORONARY ARTERY BYPASS GRAFTING (CABG) x three , using left internal mammary artery and right leg greater saphenous vein harvested endoscopically (N/A) TRANSESOPHAGEAL ECHOCARDIOGRAM (TEE) (N/A)  Patient location during evaluation: SICU Anesthesia Type: General Level of consciousness: sedated Pain management: pain level controlled Vital Signs Assessment: post-procedure vital signs reviewed and stable Respiratory status: patient remains intubated per anesthesia plan Cardiovascular status: stable Anesthetic complications: yes Anesthetic complication details: adverse drug reactionComments: Likely reaction to FFP administration.  Short period of hypotension.  Pt stabilized with epinephrine, benadryl, and solumedrol.       Last Vitals:  Vitals:   04/11/17 0645 04/11/17 0700  BP: 140/60 139/70  Pulse: 89 93  Resp: 18 20  Temp: 37.3 C 37.2 C    Last Pain:  Vitals:   04/11/17 0610  TempSrc:   PainSc: [place]

## 2017-04-11 NOTE — Progress Notes (Signed)
[facility] BRIEF SICU PROGRESS NOTE  1 Day Post-Op  S/P Procedure(s) (LRB): CORONARY ARTERY BYPASS GRAFTING (CABG) x three , using left internal mammary artery and right leg greater saphenous vein harvested endoscopically (N/A) TRANSESOPHAGEAL ECHOCARDIOGRAM (TEE) (N/A)   Stable day but having trouble w/ pain from chest tubes NSR w/ stable hemodynamics on milrinone Breathing comfortably w/ O2 sats 90-93% on 3 L/min UOP adequate Labs okay  Plan: Will try low dose toradol.  Continue routine care  Rexene Alberts, MD 04/11/2017 5:46 PM

## 2017-04-11 NOTE — Op Note (Deleted)
  The note originally documented on this encounter has been moved the the encounter in which it belongs.  

## 2017-04-11 NOTE — Progress Notes (Signed)
Hypoglycemic Event  CBG: 58  Treatment: 38mL D50 per Glucostabilizer  Symptoms: Asymptomatic  Follow-up CBG: Time: 0020 CBG Result: 87   Possible Reasons for Event: Continuous Insulin gtt  Comments/MD notified: N/A    Kenneth Duarte

## 2017-04-11 NOTE — Op Note (Signed)
Kenneth Duarte, Kenneth Duarte                 ACCOUNT NO.:  [id number]  MEDICAL RECORD NO.:  [id number]  LOCATION:                               FACILITY:  [facility]  PHYSICIAN:  Ivin Poot, M.D.  DATE OF BIRTH:  1946-01-06  DATE OF PROCEDURE: DATE OF DISCHARGE:                              OPERATIVE REPORT   OPERATIONS: 1. Coronary artery bypass grafting x3 (left internal mammary artery to     LAD, saphenous vein graft to circumflex marginal, saphenous vein     graft to the posterior descending branch of circumflex). 2. Endoscopic harvest of right leg greater saphenous vein.  SURGEON:  Ivin Poot, M.D.  ASSISTANT:  John Giovanni, PA-C.  PREOPERATIVE DIAGNOSIS:  Severe multivessel coronary artery disease, class 4 angina.  POSTOPERATIVE DIAGNOSIS:  Severe multivessel coronary artery disease, class 4 angina.  ANESTHESIA:  General by Dr. Albertha Ghee.  CLINICAL NOTE:  The patient is an obese 71 year old diabetic with history of angina.  Previous cardiac catheterization approximately 3 months ago showed what was felt to be moderate three-vessel disease. Medical therapy was recommended.  However, the patient had recurrent and in fact progressive anginal symptoms with minimal exercise, which did not respond to medical therapy.  The coronary angiograms were re- reviewed and it was felt the patient would benefit from surgical coronary revascularization.  Echocardiogram showed fairly well preserved LV systolic function.  I reviewed the patient's coronary angiogram and his echocardiogram and examined him in the office.  I felt that surgical coronary revascularization would be his best long-term therapy due to his diabetes and severe 3 vessel disease.  The patient had a small nondominant right coronary with occlusion, which was questionable target for grafting.  The patient had a total occlusion of the distal circumflex posterior descending branch, which was also questionable,  but potential target for grafting.  The LAD appeared to have a proximal lesion of the left main, which was significant, also influencing the circumflex.  I discussed the procedure of multivessel CABG with the patient and his wife including the indications, benefits, alternatives, and risks.  I discussed the details of surgery including the use of general anesthesia and cardiopulmonary bypass, the location of the surgical incision, and the expected postoperative hospital recovery.  I discussed the expected postoperative hospital recovery as well as the risks of bleeding, blood transfusion, MI, stroke, postoperative pulmonary problems including pleural effusion, postoperative cardiac arrhythmia, postoperative infection, and death.  After reviewing these issues, he demonstrated his understanding and agreed to proceed with surgery and what I felt was an informed consent.  OPERATIVE FINDINGS: 1. Suboptimal diabetic-type vessels for targets.  The RCA,     nondominant, was too small to graft.  The distal circumflex, was     small at 1 mm, but was graftable. 2. Adequate conduit quality. 3. Hemodynamic instability, transient, from allergic reaction with     dynamic cardiac function, but low systemic vascular resistance     requiring press orders transiently to support blood pressure.  It     was felt this was due to a protamine reaction or reaction to a     transfused FFP.  OPERATIVE  PROCEDURE:  The patient was brought to the operating room, placed supine on the operating table.  General anesthesia was induced under invasive hemodynamic monitoring.  A transesophageal echo probe was placed by the anesthesiologist.  The chest, abdomen, and legs were prepped with Betadine and draped as a sterile field.  A proper time-out was performed.  A sternal incision was made.  The saphenous vein was harvested endoscopically from the right leg.  The left internal mammary artery was harvested as a pedicle  graft from its origin at the subclavian vessels.  It was a 1.5-mm vessel with excellent flow.  There was a large amount of fat within the thorax, mediastinum, pericardium, and epicardium.  The sternal retractor was placed and the pericardium was opened and suspended.  The ascending aorta was covered in fat.  This was dissected away and pursestrings were placed in ascending aorta and right atrium. Heparin was administered and the ACT was documented as being therapeutic.  The patient was cannulated and placed on cardiopulmonary bypass.  The coronaries were identified for grafting.  This was difficult and tedious due to the fact that vessels were under a thick layer of epicardial fat.  The LAD and OM vessels were heavily diseased, but graftable.  The distal circumflex was 1 mm and marginal target that was grafted.  The distal RCA was too small to graft.  Cardioplegic cannulas were placed both antegrade and retrograde cold blood cardioplegia and the patient was cooled to 32 degrees.  Aortic crossclamp was applied and 1 L of cold blood cardioplegia was delivered in split doses.  There was good cardioplegic arrest and septal temperature dropped less than 14 degrees.  Cardioplegia was delivered every 20 minutes.  The distal coronary anastomoses were performed.  The first distal anastomosis was the posterior descending branch of the distal circumflex.  This was a 1 mm vessel.  It was occluded.  A reverse saphenous vein was sewn end-to-side with running 8-0 Prolene and there was adequate flow through the graft.  Cardioplegia was redosed.  The second distal anastomosis was to the circumflex marginal branch of the circumflex.  This was with a proximal significant stenosis.  The vessel wall was calcified.  The vessel was 1.5 mm.  A reverse saphenous vein was sewn end-to-side with running 7-0 Prolene.  The vessel was under a thick layer of epicardial fat.  Cardioplegia was redosed.  The third  distal anastomosis was to the distal third of the LAD.  There was a proximal left main stenosis as well as a moderate proximal LAD stenosis.  The left IMA pedicle was brought through an opening in the left lateral pericardium, was brought down onto the LAD and sewn end-to- side.  This vessel was under a thick layer of epicardial fat.  There was good flow through the anastomosis after briefly releasing the pedicle bulldog on the mammary artery.  The bulldog was reapplied and the pedicle was secured with epicardium.  Cardioplegia was redosed.  The crossclamp was still in place, 2 proximal vein anastomoses were performed on the ascending aorta using a 4.5-mm punch and running 6-0 Prolene.  Prior to tying down the final proximal anastomosis, air was vented from the coronaries in the left side of the heart using retrograde warm blood cardioplegia in the usual de-airing maneuvers. Cross-clamp was removed.  The heart was cardioverted back to a regular rhythm.  The vein grafts were de-aired and opened.  Each had good flow and hemostasis was documented at the proximal  distal anastomoses.  The patient was rewarmed and reperfused.  Temporary pacing wires were applied.  The lungs were expanded and ventilator was resumed.  The patient was weaned off cardiopulmonary bypass without difficulty on low-dose milrinone. Cardiac output was 4.5-5.0 L/minute.  Protamine was administered without adverse reaction.  The cannulas were removed.  The mediastinum was irrigated.  After we had placed chest tubes and were closing the pericardial fat over the aorta and vein grafts, the patient had a sudden allergic-type reaction with dynamic cardiac function by echo and direct observation, but with low systemic vascular resistance - SVR and drop in blood pressure.  This was managed with pressors, epinephrine, and the patient was given steroids and Benadryl for allergic reaction.  His blood pressure responded nicely and  the patient then remained stable. The chest was closed with interrupted steel wire.  The pectoralis fascia was closed with a running #1 Vicryl.  The subcutaneous and skin layers were closed in running Vicryl and sterile dressings were applied.  Total cardiopulmonary bypass time was 145 minutes.     Ivin Poot, M.D.   ______________________________ Ivin Poot, M.D.    PV/MEDQ  D:  04/10/2017  T:  04/11/2017  Job:  [id number]  cc:   Serafina Royals, MD

## 2017-04-11 NOTE — Care Management Note (Signed)
Case Management Note Marvetta Gibbons RN, BSN Unit 2W-Case Manager- [place] coverage [phone]  Patient Details  Name: Kenneth Duarte MRN: [id number] Date of Birth: [birth year]  Subjective/Objective:  Pt admitted s/p CABGx3 on 04/10/17                 Action/Plan: PTA pt lived at home with wife- CM to follow for d/c needs  Expected Discharge Date:                  Expected Discharge Plan:     In-House Referral:     Discharge planning Services  CM Consult  Post Acute Care Choice:    Choice offered to:     DME Arranged:    DME Agency:     HH Arranged:    HH Agency:     Status of Service:  In process, will continue to follow  If discussed at Long Length of Stay Meetings, dates discussed:    Additional Comments:  Dawayne Patricia, RN 04/11/2017, 10:17 AM

## 2017-04-12 ENCOUNTER — Inpatient Hospital Stay (HOSPITAL_COMMUNITY): Payer: Medicare PPO

## 2017-04-12 LAB — TYPE AND SCREEN
ABO/RH(D): O POS
Antibody Screen: NEGATIVE
Unit division: 0
Unit division: 0

## 2017-04-12 LAB — CBC
HCT: 26.9 % — ABNORMAL LOW (ref 39.0–52.0)
Hemoglobin: 9 g/dL — ABNORMAL LOW (ref 13.0–17.0)
MCH: 29 pg (ref 26.0–34.0)
MCHC: 33.5 g/dL (ref 30.0–36.0)
MCV: 86.8 fL (ref 78.0–100.0)
Platelets: 129 10*3/uL — ABNORMAL LOW (ref 150–400)
RBC: 3.1 MIL/uL — ABNORMAL LOW (ref 4.22–5.81)
RDW: 14.7 % (ref 11.5–15.5)
WBC: 9.4 10*3/uL (ref 4.0–10.5)

## 2017-04-12 LAB — BASIC METABOLIC PANEL
Anion gap: 7 (ref 5–15)
BUN: 8 mg/dL (ref 6–20)
CO2: 27 mmol/L (ref 22–32)
Calcium: 8.4 mg/dL — ABNORMAL LOW (ref 8.9–10.3)
Chloride: 101 mmol/L (ref 101–111)
Creatinine, Ser: 0.87 mg/dL (ref 0.61–1.24)
GFR calc Af Amer: >60 mL/min (ref >60)
GFR calc non Af Amer: >60 mL/min (ref >60)
Glucose, Bld: 138 mg/dL — ABNORMAL HIGH (ref 65–99)
Potassium: 4.2 mmol/L (ref 3.5–5.1)
Sodium: 135 mmol/L (ref 135–145)

## 2017-04-12 LAB — GLUCOSE, CAPILLARY
Glucose-Capillary: 105 mg/dL — ABNORMAL HIGH (ref 65–99)
Glucose-Capillary: 124 mg/dL — ABNORMAL HIGH (ref 65–99)
Glucose-Capillary: 165 mg/dL — ABNORMAL HIGH (ref 65–99)
Glucose-Capillary: 168 mg/dL — ABNORMAL HIGH (ref 65–99)
Glucose-Capillary: 170 mg/dL — ABNORMAL HIGH (ref 65–99)
Glucose-Capillary: 188 mg/dL — ABNORMAL HIGH (ref 65–99)

## 2017-04-12 LAB — BPAM RBC
Blood Product Expiration Date: 201805112359
Blood Product Expiration Date: 201805112359
ISSUE DATE / TIME: 201804180805
ISSUE DATE / TIME: 201804181028
Unit Type and Rh: 5100
Unit Type and Rh: 5100

## 2017-04-12 LAB — POCT I-STAT, CHEM 8
BUN: 14 mg/dL (ref 6–20)
Calcium, Ion: 1.15 mmol/L (ref 1.15–1.40)
Chloride: 97 mmol/L — ABNORMAL LOW (ref 101–111)
Creatinine, Ser: 0.9 mg/dL (ref 0.61–1.24)
Glucose, Bld: 173 mg/dL — ABNORMAL HIGH (ref 65–99)
HCT: 24 % — ABNORMAL LOW (ref 39.0–52.0)
Hemoglobin: 8.2 g/dL — ABNORMAL LOW (ref 13.0–17.0)
Potassium: 3.8 mmol/L (ref 3.5–5.1)
Sodium: 137 mmol/L (ref 135–145)
TCO2: 28 mmol/L (ref 0–100)

## 2017-04-12 MED ORDER — GUAIFENESIN ER 600 MG PO TB12
600.0000 mg | ORAL_TABLET | Freq: Two times a day (BID) | ORAL | Status: DC
  Administered 2017-04-12 – 2017-04-13 (×3): 600 mg via ORAL
  Filled 2017-04-12 (×4): qty 1

## 2017-04-12 MED ORDER — KETOROLAC TROMETHAMINE 15 MG/ML IJ SOLN
15.0000 mg | Freq: Four times a day (QID) | INTRAMUSCULAR | Status: AC
  Administered 2017-04-12 (×2): 15 mg via INTRAVENOUS
  Filled 2017-04-12 (×2): qty 1

## 2017-04-12 MED ORDER — INSULIN GLARGINE 100 UNIT/ML ~~LOC~~ SOLN
10.0000 [IU] | Freq: Every day | SUBCUTANEOUS | Status: DC
  Administered 2017-04-12 – 2017-04-13 (×2): 10 [IU] via SUBCUTANEOUS
  Filled 2017-04-12 (×2): qty 0.1

## 2017-04-12 MED ORDER — FE FUMARATE-B12-VIT C-FA-IFC PO CAPS
1.0000 | ORAL_CAPSULE | Freq: Two times a day (BID) | ORAL | Status: DC
  Administered 2017-04-13 – 2017-04-15 (×5): 1 via ORAL
  Filled 2017-04-12 (×5): qty 1

## 2017-04-12 NOTE — Progress Notes (Signed)
CT surgery p.m. Rounds  Patient had stable day, chest tubes removed Ambulated in the hallway twice Remains in sinus rhythm P.m. labs satisfactory, hemoglobin decreased at 8.0-we'll start oral iron supplement

## 2017-04-12 NOTE — Progress Notes (Signed)
2 Days Post-Op Procedure(s) (LRB): CORONARY ARTERY BYPASS GRAFTING (CABG) x three , using left internal mammary artery and right leg greater saphenous vein harvested endoscopically (N/A) TRANSESOPHAGEAL ECHOCARDIOGRAM (TEE) (N/A) Subjective:  Postop day 2 CABG 3 for severe diabetic multivessel CAD and unstable angina Hemodynamically stable maintaining sinus rhythm Chest x-ray with minimal atelectasis Chest tube drainage now tapering off we'll remove tubes today Ambulating in hallway Weaning off milrinone  Objective: Vital signs in last 24 hours: Temp:  [98.7 F (37.1 C)-99.8 F (37.7 C)] 99.2 F (37.3 C) (04/26 1100) Pulse Rate:  [69-100] 86 (04/26 1500) Cardiac Rhythm: Normal sinus rhythm (04/26 1100) Resp:  [13-23] 18 (04/26 1500) BP: (107-150)/(47-88) 133/62 (04/26 1500) SpO2:  [92 %-98 %] 98 % (04/26 1500) Weight:  [200 lb 9.6 oz (91 kg)] 200 lb 9.6 oz (91 kg) (04/26 0500)  Hemodynamic parameters for last 24 hours:  sinus rhythm  Intake/Output from previous day: 04/25 0701 - 04/26 0700 In: 622.7 [I.V.:522.7; IV Piggyback:100] Out: 9480 [Urine:3220; Chest Tube:600] Intake/Output this shift: Total I/O In: 497.9 [P.O.:420; I.V.:77.9] Out: 1140 [Urine:1100; Chest Tube:40]       Exam    General- alert and comfortable   Lungs- clear without rales, wheezes   Cor- regular rate and rhythm, no murmur , gallop   Abdomen- soft, non-tender   Extremities - warm, non-tender, minimal edema   Neuro- oriented, appropriate, no focal weakness   Lab Results:  Recent Labs  04/11/17 1617 04/12/17 0440  WBC 11.4* 9.4  HGB 9.2* 9.0*  HCT 27.4* 26.9*  PLT 134* 129*   BMET:  Recent Labs  04/11/17 0257 04/11/17 1612 04/11/17 1617 04/12/17 0440  NA 135 135  --  135  K 4.2 4.2  --  4.2  CL 104 96*  --  101  CO2 25  --   --  27  GLUCOSE 125* 162*  --  138*  BUN 8 8  --  8  CREATININE 0.79 0.80 0.84 0.87  CALCIUM 8.1*  --   --  8.4*    PT/INR:  Recent Labs   04/10/17 1440  LABPROT 17.0*  INR 1.37   ABG    Component Value Date/Time   PHART 7.363 04/10/2017 1921   HCO3 23.8 04/10/2017 1921   TCO2 27 04/11/2017 1612   ACIDBASEDEF 1.0 04/10/2017 1921   O2SAT 95.0 04/10/2017 1921   CBG (last 3)   Recent Labs  04/12/17 0435 04/12/17 0748 04/12/17 1115  GLUCAP 124* 165* 188*    Assessment/Plan: S/P Procedure(s) (LRB): CORONARY ARTERY BYPASS GRAFTING (CABG) x three , using left internal mammary artery and right leg greater saphenous vein harvested endoscopically (N/A) TRANSESOPHAGEAL ECHOCARDIOGRAM (TEE) (N/A) Mobilize Diuresis d/c tubes/lines Continue Lantus plus sliding scale   LOS: 2 days    Tharon Aquas Trigt III 04/12/2017

## 2017-04-13 ENCOUNTER — Inpatient Hospital Stay (HOSPITAL_COMMUNITY): Payer: Medicare PPO

## 2017-04-13 LAB — GLUCOSE, CAPILLARY
Glucose-Capillary: 113 mg/dL — ABNORMAL HIGH (ref 65–99)
Glucose-Capillary: 96 mg/dL (ref 65–99)
Glucose-Capillary: 124 mg/dL — ABNORMAL HIGH (ref 65–99)
Glucose-Capillary: 273 mg/dL — ABNORMAL HIGH (ref 65–99)
Glucose-Capillary: 97 mg/dL (ref 65–99)
Glucose-Capillary: 143 mg/dL — ABNORMAL HIGH (ref 65–99)

## 2017-04-13 LAB — CBC
HCT: 25.2 % — ABNORMAL LOW (ref 39.0–52.0)
Hemoglobin: 8.8 g/dL — ABNORMAL LOW (ref 13.0–17.0)
MCH: 30.3 pg (ref 26.0–34.0)
MCHC: 34.9 g/dL (ref 30.0–36.0)
MCV: 86.9 fL (ref 78.0–100.0)
Platelets: 124 10*3/uL — ABNORMAL LOW (ref 150–400)
RBC: 2.9 MIL/uL — ABNORMAL LOW (ref 4.22–5.81)
RDW: 14.9 % (ref 11.5–15.5)
WBC: 7.6 10*3/uL (ref 4.0–10.5)

## 2017-04-13 LAB — BASIC METABOLIC PANEL
Anion gap: 8 (ref 5–15)
BUN: 11 mg/dL (ref 6–20)
CO2: 29 mmol/L (ref 22–32)
Calcium: 8.1 mg/dL — ABNORMAL LOW (ref 8.9–10.3)
Chloride: 97 mmol/L — ABNORMAL LOW (ref 101–111)
Creatinine, Ser: 0.87 mg/dL (ref 0.61–1.24)
GFR calc Af Amer: >60 mL/min (ref >60)
GFR calc non Af Amer: >60 mL/min (ref >60)
Glucose, Bld: 117 mg/dL — ABNORMAL HIGH (ref 65–99)
Potassium: 3.4 mmol/L — ABNORMAL LOW (ref 3.5–5.1)
Sodium: 134 mmol/L — ABNORMAL LOW (ref 135–145)

## 2017-04-13 MED ORDER — POTASSIUM CHLORIDE CRYS ER 20 MEQ PO TBCR
20.0000 meq | EXTENDED_RELEASE_TABLET | Freq: Two times a day (BID) | ORAL | Status: DC
  Administered 2017-04-13 – 2017-04-15 (×5): 20 meq via ORAL
  Filled 2017-04-13 (×5): qty 1

## 2017-04-13 MED ORDER — INSULIN ASPART 100 UNIT/ML ~~LOC~~ SOLN
0.0000 [IU] | Freq: Three times a day (TID) | SUBCUTANEOUS | Status: DC
  Administered 2017-04-13: 12 [IU] via SUBCUTANEOUS
  Administered 2017-04-13 – 2017-04-14 (×2): 2 [IU] via SUBCUTANEOUS
  Administered 2017-04-14: 4 [IU] via SUBCUTANEOUS
  Administered 2017-04-14: 2 [IU] via SUBCUTANEOUS

## 2017-04-13 MED ORDER — MOVING RIGHT ALONG BOOK
Freq: Once | Status: AC
  Administered 2017-04-13: 1
  Filled 2017-04-13 (×2): qty 1

## 2017-04-13 MED ORDER — SODIUM CHLORIDE 0.9% FLUSH: 3.0000 mL | INTRAVENOUS | Status: DC | PRN

## 2017-04-13 MED ORDER — FUROSEMIDE 40 MG PO TABS
40.0000 mg | ORAL_TABLET | Freq: Every day | ORAL | Status: DC
  Administered 2017-04-13 – 2017-04-15 (×3): 40 mg via ORAL
  Filled 2017-04-13 (×3): qty 1

## 2017-04-13 MED ORDER — SODIUM CHLORIDE 0.9% FLUSH: 3.0000 mL | Freq: Two times a day (BID) | INTRAVENOUS | Status: DC

## 2017-04-13 MED ORDER — SODIUM CHLORIDE 0.9 % IV SOLN: 250.0000 mL | INTRAVENOUS | Status: DC | PRN

## 2017-04-13 NOTE — Progress Notes (Signed)
Report called at this time.

## 2017-04-13 NOTE — Progress Notes (Addendum)
3 Days Post-Op Procedure(s) (LRB): CORONARY ARTERY BYPASS GRAFTING (CABG) x three , using left internal mammary artery and right leg greater saphenous vein harvested endoscopically (N/A) TRANSESOPHAGEAL ECHOCARDIOGRAM (TEE) (N/A) Subjective: Doing well [postop CABGx3, DM nsr CXR clear Ready for tx to 2 west, home sunday Objective: Vital signs in last 24 hours: Temp:  [97.8 F (36.6 C)-100.3 F (37.9 C)] 98.3 F (36.8 C) (04/27 1345) Pulse Rate:  [69-99] 77 (04/27 1345) Cardiac Rhythm: Normal sinus rhythm (04/27 1000) Resp:  [13-24] 20 (04/27 1345) BP: (115-172)/(52-79) 134/65 (04/27 1345) SpO2:  [92 %-99 %] 98 % (04/27 1345) Weight:  [196 lb 6.9 oz (89.1 kg)] 196 lb 6.9 oz (89.1 kg) (04/27 0500)  Hemodynamic parameters for last 24 hours:  stable  Intake/Output from previous day: 04/26 0701 - 04/27 0700 In: 705.9 [P.O.:420; I.V.:285.9] Out: 7616 [Urine:3575; Chest Tube:40] Intake/Output this shift: Total I/O In: 730 [P.O.:720; I.V.:10] Out: 751 [Urine:750; Stool:1]       Exam    General- alert and comfortable   Lungs- clear without rales, wheezes   Cor- regular rate and rhythm, no murmur , gallop   Abdomen- soft, non-tender   Extremities - warm, non-tender, minimal edema   Neuro- oriented, appropriate, no focal weakness   Lab Results:  Recent Labs  04/12/17 0440 04/12/17 1607 04/13/17 0342  WBC 9.4  --  7.6  HGB 9.0* 8.2* 8.8*  HCT 26.9* 24.0* 25.2*  PLT 129*  --  124*   BMET:  Recent Labs  04/12/17 0440 04/12/17 1607 04/13/17 0342  NA 135 137 134*  K 4.2 3.8 3.4*  CL 101 97* 97*  CO2 27  --  29  GLUCOSE 138* 173* 117*  BUN 8 14 11   CREATININE 0.87 0.90 0.87  CALCIUM 8.4*  --  8.1*    PT/INR: No results for input(s): LABPROT, INR in the last 72 hours. ABG    Component Value Date/Time   PHART 7.363 04/10/2017 1921   HCO3 23.8 04/10/2017 1921   TCO2 28 04/12/2017 1607   ACIDBASEDEF 1.0 04/10/2017 1921   O2SAT 95.0 04/10/2017 1921   CBG  (last 3)   Recent Labs  04/13/17 0345 04/13/17 0752 04/13/17 1127  GLUCAP 113* 124* 273*    Assessment/Plan: S/P Procedure(s) (LRB): CORONARY ARTERY BYPASS GRAFTING (CABG) x three , using left internal mammary artery and right leg greater saphenous vein harvested endoscopically (N/A) TRANSESOPHAGEAL ECHOCARDIOGRAM (TEE) (N/A) Diabetes control tx to 2 west reg bed Serous sternal wound drainage- dressing changes and keep in hospital until it resolves   LOS: 3 days    Kenneth Duarte 04/13/2017

## 2017-04-13 NOTE — Progress Notes (Signed)
Patient arrived the unit on a wheelchair from 2 H, assessment completed see flowsheet, patient oriented to room and staff, placed on tel CCMD notified, bed in lowest position, call bell within reach will continue to monitor.

## 2017-04-14 ENCOUNTER — Inpatient Hospital Stay (HOSPITAL_COMMUNITY): Payer: Medicare PPO

## 2017-04-14 LAB — BASIC METABOLIC PANEL WITH GFR
Anion gap: 7 (ref 5–15)
BUN: 11 mg/dL (ref 6–20)
CO2: 29 mmol/L (ref 22–32)
Calcium: 8.3 mg/dL — ABNORMAL LOW (ref 8.9–10.3)
Chloride: 103 mmol/L (ref 101–111)
Creatinine, Ser: 0.82 mg/dL (ref 0.61–1.24)
GFR calc Af Amer: >60 mL/min
GFR calc non Af Amer: >60 mL/min
Glucose, Bld: 146 mg/dL — ABNORMAL HIGH (ref 65–99)
Potassium: 4 mmol/L (ref 3.5–5.1)
Sodium: 139 mmol/L (ref 135–145)

## 2017-04-14 LAB — GLUCOSE, CAPILLARY
Glucose-Capillary: 125 mg/dL — ABNORMAL HIGH (ref 65–99)
Glucose-Capillary: 173 mg/dL — ABNORMAL HIGH (ref 65–99)
Glucose-Capillary: 110 mg/dL — ABNORMAL HIGH (ref 65–99)
Glucose-Capillary: 144 mg/dL — ABNORMAL HIGH (ref 65–99)

## 2017-04-14 LAB — CBC
HCT: 26.8 % — ABNORMAL LOW (ref 39.0–52.0)
Hemoglobin: 8.8 g/dL — ABNORMAL LOW (ref 13.0–17.0)
MCH: 28.9 pg (ref 26.0–34.0)
MCHC: 32.8 g/dL (ref 30.0–36.0)
MCV: 88.2 fL (ref 78.0–100.0)
Platelets: 161 10*3/uL (ref 150–400)
RBC: 3.04 MIL/uL — ABNORMAL LOW (ref 4.22–5.81)
RDW: 14.9 % (ref 11.5–15.5)
WBC: 6.8 10*3/uL (ref 4.0–10.5)

## 2017-04-14 MED ORDER — GUAIFENESIN ER 600 MG PO TB12
600.0000 mg | ORAL_TABLET | Freq: Two times a day (BID) | ORAL | Status: DC
  Administered 2017-04-14 – 2017-04-15 (×3): 600 mg via ORAL
  Filled 2017-04-14 (×2): qty 1

## 2017-04-14 MED ORDER — LISINOPRIL 10 MG PO TABS
10.0000 mg | ORAL_TABLET | Freq: Every day | ORAL | Status: DC
  Filled 2017-04-14: qty 1

## 2017-04-14 MED ORDER — LISINOPRIL 5 MG PO TABS
5.0000 mg | ORAL_TABLET | Freq: Every day | ORAL | Status: DC
  Administered 2017-04-14: 5 mg via ORAL
  Filled 2017-04-14: qty 1

## 2017-04-14 MED ORDER — PIOGLITAZONE HCL 15 MG PO TABS
15.0000 mg | ORAL_TABLET | Freq: Every day | ORAL | Status: DC
  Administered 2017-04-14: 15 mg via ORAL
  Filled 2017-04-14: qty 1

## 2017-04-14 MED ORDER — BUDESONIDE 0.25 MG/2ML IN SUSP
0.2500 mg | Freq: Two times a day (BID) | RESPIRATORY_TRACT | Status: DC
  Administered 2017-04-14 – 2017-04-15 (×2): 0.25 mg via RESPIRATORY_TRACT
  Filled 2017-04-14 (×2): qty 2

## 2017-04-14 MED ORDER — METFORMIN HCL 500 MG PO TABS
500.0000 mg | ORAL_TABLET | Freq: Two times a day (BID) | ORAL | Status: DC
Start: 2017-04-14 — End: 2017-04-15
  Administered 2017-04-14 – 2017-04-15 (×2): 500 mg via ORAL
  Filled 2017-04-14 (×2): qty 1

## 2017-04-14 NOTE — Progress Notes (Addendum)
      [address]       [place] [address]             [phone]      4 Days Post-Op Procedure(s) (LRB): CORONARY ARTERY BYPASS GRAFTING (CABG) x three , using left internal mammary artery and right leg greater saphenous vein harvested endoscopically (N/A) TRANSESOPHAGEAL ECHOCARDIOGRAM (TEE) (N/A)   Subjective:  No specific complaints.  Doing okay.  Does have some pulmonary congestion which he is unable to cough up.  + ambulation  Objective: Vital signs in last 24 hours: Temp:  [98.1 F (36.7 C)-98.4 F (36.9 C)] 98.1 F (36.7 C) (04/28 0549) Pulse Rate:  [77-99] 92 (04/28 0549) Cardiac Rhythm: Normal sinus rhythm (04/28 0700) Resp:  [18-22] 18 (04/28 0549) BP: (113-172)/(55-67) 145/65 (04/28 0549) SpO2:  [98 %-99 %] 99 % (04/28 0549) Weight:  [193 lb 6.4 oz (87.7 kg)] 193 lb 6.4 oz (87.7 kg) (04/28 0542)  Intake/Output from previous day: 04/27 0701 - 04/28 0700 In: 1023.2 [P.O.:960; I.V.:63.2] Out: 2701 [Urine:2700; Stool:1] Intake/Output this shift: Total I/O In: 240 [P.O.:240] Out: 80 [Urine:80]  General appearance: alert, cooperative and no distress Heart: regular rate and rhythm Lungs: clear to auscultation bilaterally Abdomen: soft, non-tender; bowel sounds normal; no masses,  no organomegaly Extremities: edema trace Wound: clean and dry, ecchymotic RLE  Lab Results:  Recent Labs  04/13/17 0342 04/14/17 0326  WBC 7.6 6.8  HGB 8.8* 8.8*  HCT 25.2* 26.8*  PLT 124* 161   BMET:  Recent Labs  04/13/17 0342 04/14/17 0326  NA 134* 139  K 3.4* 4.0  CL 97* 103  CO2 29 29  GLUCOSE 117* 146*  BUN 11 11  CREATININE 0.87 0.82  CALCIUM 8.1* 8.3*    PT/INR: No results for input(s): LABPROT, INR in the last 72 hours. ABG    Component Value Date/Time   PHART 7.363 04/10/2017 1921   HCO3 23.8 04/10/2017 1921   TCO2 28 04/12/2017 1607   ACIDBASEDEF 1.0 04/10/2017 1921   O2SAT 95.0 04/10/2017 1921   CBG (last 3)   Recent Labs  04/13/17 1609 04/13/17 2112 04/14/17 0543  GLUCAP 97 143* 125*    Assessment/Plan: S/P Procedure(s) (LRB): CORONARY ARTERY BYPASS GRAFTING (CABG) x three , using left internal mammary artery and right leg greater saphenous vein harvested endoscopically (N/A) TRANSESOPHAGEAL ECHOCARDIOGRAM (TEE) (N/A)  1. CV- NSR, + HTN- continue Lopressor, Imdur, restart home Lisinopril at reduced dose 2. Pulm- wean oxygen as tolerated, continue IS, will start nebs, Mucinex 3. Renal- creatinine WNL, weight is below baseline, continue Lasix for now 4. DM- will d/c insulin restart home Metformin, Actos 5. ID- serous sternal drainage, minimal, no fever, no leukocytosis or active signs of infection 6. dispo- patient stable, restart home ACE for HTN, continue aggressive pulmonary toilet, possibly home in next 24-48 hours   LOS: 4 days    Kenneth Duarte 04/14/2017   Chart reviewed, patient examined, agree with above. Can DC Imdur and restart Lisinopril for HTN. Chest incision looks fine. Probably home tomorrow if weaned off oxygen and no other changes.

## 2017-04-14 NOTE — Progress Notes (Signed)
Patient education completed for removal of pacer wires, patient vrebalised understanding, 4 pacer wires removed as ordered, well tolerated patient now on bedrest will continue to monitor

## 2017-04-14 NOTE — Progress Notes (Signed)
CARDIAC REHAB PHASE I   PRE:  Rate/Rhythm: Sinus 83  BP:    Sitting: 117/52     SaO2: 99% Room Air  MODE:  Ambulation: 500 ft   POST:  Rate/Rhythem: 86  BP:    Sitting: 111/52    SaO2: 100% Room Air  1300-1400 Patient ambulated in the hallway using a rolling walker.Tolerated well patient assisted back to chair with call bell with reach. Exercise instructions reviewed with the patient. Discussed temperature precautions. End points of exercise. Sternal precautions, use of incentive spirometer, heart healthy  diabetic diet reviewed with the patient and his family. Kenneth Duarte says he does not think he is interested in participating in phase 2 cardiac rehab but is willing to be contacted by [facility]. Discharge Open Heart Surgery Video set up with the patient and the family. Harrell Gave  RN BSN

## 2017-04-15 LAB — GLUCOSE, CAPILLARY: Glucose-Capillary: 117 mg/dL — ABNORMAL HIGH (ref 65–99)

## 2017-04-15 MED ORDER — LISINOPRIL 10 MG PO TABS
20.0000 mg | ORAL_TABLET | Freq: Every day | ORAL | Status: DC
  Administered 2017-04-15: 20 mg via ORAL
  Filled 2017-04-15: qty 2

## 2017-04-15 MED ORDER — ACETAMINOPHEN 500 MG PO TABS: 1000.0000 mg | ORAL_TABLET | Freq: Four times a day (QID) | ORAL | 0 refills | Status: DC | PRN

## 2017-04-15 MED ORDER — CARVEDILOL 6.25 MG PO TABS: 6.2500 mg | ORAL_TABLET | Freq: Two times a day (BID) | ORAL | 3 refills | Status: DC

## 2017-04-15 MED ORDER — ASPIRIN 325 MG PO TBEC
325.0000 mg | DELAYED_RELEASE_TABLET | Freq: Every day | ORAL | 0 refills | Status: DC
Start: 2017-04-15 — End: 2017-06-20

## 2017-04-15 MED ORDER — POTASSIUM CHLORIDE CRYS ER 20 MEQ PO TBCR: 20.0000 meq | EXTENDED_RELEASE_TABLET | Freq: Every day | ORAL | 0 refills | Status: DC

## 2017-04-15 MED ORDER — OXYCODONE-ACETAMINOPHEN 10-325 MG PO TABS: 1.0000 | ORAL_TABLET | ORAL | 0 refills | Status: DC | PRN

## 2017-04-15 MED ORDER — FUROSEMIDE 40 MG PO TABS: 40.0000 mg | ORAL_TABLET | Freq: Every day | ORAL | 0 refills | Status: DC

## 2017-04-15 NOTE — Progress Notes (Signed)
Pt has been discharged home with wife. IV and telemetry box removed. Pt and pt's wife received discharge instructions and all questions were answered. Pt left with all of his belongings. Pt left the unit via wheelchair and was accompanied by a nurse tech.  Grant Fontana BSN, RN

## 2017-04-15 NOTE — Progress Notes (Signed)
      [address]       [place] [address]             [phone]      5 Days Post-Op Procedure(s) (LRB): CORONARY ARTERY BYPASS GRAFTING (CABG) x three , using left internal mammary artery and right leg greater saphenous vein harvested endoscopically (N/A) TRANSESOPHAGEAL ECHOCARDIOGRAM (TEE) (N/A)   Subjective:  Kenneth Duarte is feeling good.  He has no complaints and is ready to go home.  + ambulation  + BM  Objective: Vital signs in last 24 hours: Temp:  [98 F (36.7 C)-98.5 F (36.9 C)] 98.4 F (36.9 C) (04/29 0600) Pulse Rate:  [73-98] 91 (04/29 0600) Cardiac Rhythm: Normal sinus rhythm (04/29 0700) Resp:  [18] 18 (04/29 0600) BP: (129-150)/(51-64) 134/62 (04/29 0600) SpO2:  [96 %-100 %] 100 % (04/29 0746) Weight:  [192 lb (87.1 kg)] 192 lb (87.1 kg) (04/29 0600)  Intake/Output from previous day: 04/28 0701 - 04/29 0700 In: 1200 [P.O.:1200] Out: 880 [Urine:880]  General appearance: alert, cooperative and no distress Heart: regular rate and rhythm Lungs: clear to auscultation bilaterally Abdomen: soft, non-tender; bowel sounds normal; no masses,  no organomegaly Extremities: edema trace Wound: clean and dry  Lab Results:  Recent Labs  04/13/17 0342 04/14/17 0326  WBC 7.6 6.8  HGB 8.8* 8.8*  HCT 25.2* 26.8*  PLT 124* 161   BMET:  Recent Labs  04/13/17 0342 04/14/17 0326  NA 134* 139  K 3.4* 4.0  CL 97* 103  CO2 29 29  GLUCOSE 117* 146*  BUN 11 11  CREATININE 0.87 0.82  CALCIUM 8.1* 8.3*    PT/INR: No results for input(s): LABPROT, INR in the last 72 hours. ABG    Component Value Date/Time   PHART 7.363 04/10/2017 1921   HCO3 23.8 04/10/2017 1921   TCO2 28 04/12/2017 1607   ACIDBASEDEF 1.0 04/10/2017 1921   O2SAT 95.0 04/10/2017 1921   CBG (last 3)   Recent Labs  04/14/17 1637 04/14/17 2224 04/15/17 0611  GLUCAP 110* 144* 117*    Assessment/Plan: S/P Procedure(s) (LRB): CORONARY ARTERY BYPASS GRAFTING (CABG) x  three , using left internal mammary artery and right leg greater saphenous vein harvested endoscopically (N/A) TRANSESOPHAGEAL ECHOCARDIOGRAM (TEE) (N/A)  1. CV- NSR, HTN- will continue Lopressor, increase Lisinopril to home dose  2. Pulm- no acute issues, off oxygen, continue IS at discharge 3. Renal- creatinine has been stable, weight remains below baseline, will taper over next week 4. DM-sugars controlled 5. ID- no sternal drainage present, no signs of infection 6. Dispo- patient stable, will d/c home today   LOS: 5 days    Ahmed Prima, Junie Panning 04/15/2017

## 2017-04-15 NOTE — Discharge Summary (Signed)
Physician Discharge Summary  Patient ID: Lamontae Ricardo MRN: [id number] DOB/AGE: 69-Sep-1947 69 y.o.  Admit date: 04/10/2017 Discharge date: 04/15/2017  Admission Diagnoses:  Patient Active Problem List   Diagnosis Date Noted  . Stable angina ([place]) 02/14/2017  . Personal history of colonic polyps   . Benign neoplasm of sigmoid colon   . Benign essential HTN 06/02/2015  . Breathlessness on exertion 02/02/2015  . Carotid artery plaque 10/26/2014  . Arteriosclerosis of coronary artery 10/16/2014  . Diabetes ([place]) 10/16/2014  . Combined fat and carbohydrate induced hyperlipemia 10/16/2014  . Diabetes mellitus ([place]) 10/16/2014   Discharge Diagnoses:   Patient Active Problem List   Diagnosis Date Noted  . 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 Stable angina ([place]) 02/14/2017  . Personal history of colonic polyps   . Benign neoplasm of sigmoid colon   . Benign essential HTN 06/02/2015  . Breathlessness on exertion 02/02/2015  . Carotid artery plaque 10/26/2014  . Arteriosclerosis of coronary artery 10/16/2014  . Diabetes ([place]) 10/16/2014  . Combined fat and carbohydrate induced hyperlipemia 10/16/2014  . Diabetes mellitus ([place]) 10/16/2014   Discharged Condition: good  History of Present Illness:  Mr. Sahakian is a 71 yo white male with known history of Type 2 DM.  The patient developed complaints of substernal chest pain and tightness.  This would occur with exertion.  The frequency of these episodes had increased in intensity and he was evaluated by a Cardiologist.  Stress test was performed which was equivocal.  However, due to persistent episodes of pain which relieved with use of NTG it was felt coronary bypass grafting would be indicated.  This was performed in late March and showed moderate CAD.  The patient's anti-anginal medications were increased, which unfortunately did not provided relief.  Due to this the patient was referred to [facility] for possible coronary bypass procedure.  Dr Prescott Gum  evaluated the patient and after review of his coronary angiogram felt the patient would benefit from bypass procedure.  The risks and benefits of the procedure were explained to the patient and he was agreeable to proceed.  Hospital Course:   Mr. Pavey presented to [facility] on 04/10/2017.  He was taken to the operating room and underwent CABG x 3 utilizing LIMA to LAD, SVG to OM, and SVG to PDA.  He also underwent endoscopic harvest of the greater saphenous vein from his right leg.  He tolerated the procedure without difficulty and was taken to the SICU in stable condition.  The patient was extubaeed the evening of surgery.  During his stay in the SICU the patient was weaned off milrinone as tolerated.  His chest tubes and arterial lines were removed without difficulty.  He was maintaining NSR.  He was ambulating without much difficulty in the ICU.  He was started on iron supplementation for a decrease in hemoglobin level.  He was felt medically stable for discharge to the telemetry unit on POD #3.  The patient continues to make progress.  He developed minimal serous drainage from the superior portion of his sternotomy.  This resolved and there were no signs of infection present.  He continued to maintain NSR and his pacing wires were removed without difficulty.  The patient was diuresed for hypervolemia.  He was hypertensive and restarted on his home regimen of Lisinopril.  He continues to ambulate without difficulty.  He was successfully weaned off oxygen.  His pain is well controlled.  He is felt medically stable  for discharge home today.     Significant Diagnostic Studies: angiography:    LM lesion, 50 %stenosed.  Ost Cx to Mid Cx lesion, 70 %stenosed.  Mid LAD lesion, 55 %stenosed.  Mid RCA lesion, 95 %stenosed.  Mid RCA to Dist RCA lesion, 99 %stenosed.  Dist LAD lesion, 60 %stenosed.  Dist Cx-1 lesion, 60 %stenosed.  Dist Cx-2 lesion, 95 %stenosed  Treatments: surgery:   1.  Coronary artery bypass grafting x3 (left internal mammary artery to     LAD, saphenous vein graft to circumflex marginal, saphenous vein     graft to the posterior descending branch of circumflex). 2. Endoscopic harvest of right leg greater saphenous vein.  Disposition: 01-Home or Self Care   Discharge Medications:  The patient has been discharged on:   1.Beta Blocker:  Yes [ x  ]                              No   [   ]                              If No, reason:  2.Ace Inhibitor/ARB: Yes [ x  ]                                     No  [    ]                                     If No, reason:  3.Statin:   Yes [ x  ]                  No  [   ]                  If No, reason:  4.Ecasa:  Yes  [ x  ]                  No   [   ]                  If No, reason:     Discharge Instructions    Amb Referral to Cardiac Rehabilitation    Complete by:  As directed    Diagnosis:  CABG   CABG X ___:  3     Allergies as of 04/15/2017      Reactions   Cortisone Acetate [cortisone] Rash      Medication List    STOP taking these medications   amLODipine 10 MG tablet Commonly known as:  NORVASC   amoxicillin-clavulanate 875-125 MG tablet Commonly known as:  AUGMENTIN   aspirin 81 MG tablet Replaced by:  aspirin 325 MG EC tablet   isosorbide mononitrate 60 MG 24 hr tablet Commonly known as:  IMDUR   nitroGLYCERIN 0.4 MG SL tablet Commonly known as:  NITROSTAT     TAKE these medications   acetaminophen 500 MG tablet Commonly known as:  TYLENOL Take 2 tablets (1,000 mg total) by mouth every 6 (six) hours as needed.   alprostadil 20 MCG injection Commonly known as:  EDEX 20 mcg by Intracavitary route as needed for erectile dysfunction. use no more than 3 times per week  aspirin 325 MG EC tablet Take 1 tablet (325 mg total) by mouth daily. Replaces:  aspirin 81 MG tablet   atorvastatin 80 MG tablet Commonly known as:  LIPITOR Take 80 mg by mouth at bedtime.    beclomethasone 40 MCG/ACT inhaler Commonly known as:  QVAR Inhale 1 puff into the lungs 2 (two) times daily.   carvedilol 6.25 MG tablet Commonly known as:  COREG Take 1 tablet (6.25 mg total) by mouth 2 (two) times daily with a meal. What changed:  medication strength  how much to take   fluticasone 50 MCG/ACT nasal spray Commonly known as:  FLONASE Place 2 sprays into both nostrils 2 (two) times daily.   furosemide 40 MG tablet Commonly known as:  LASIX Take 1 tablet (40 mg total) by mouth daily. For 7 Days   gabapentin 800 MG tablet Commonly known as:  NEURONTIN Take 1,600 mg by mouth 3 (three) times daily.   guaiFENesin 600 MG 12 hr tablet Commonly known as:  MUCINEX Take 600 mg by mouth 2 (two) times daily.   lisinopril 20 MG tablet Commonly known as:  PRINIVIL,ZESTRIL Take 20 mg by mouth daily.   metFORMIN 500 MG 24 hr tablet Commonly known as:  GLUCOPHAGE-XR Take 500 mg by mouth at bedtime.   omeprazole 20 MG capsule Commonly known as:  PRILOSEC Take 20 mg by mouth at bedtime.   oxyCODONE-acetaminophen 10-325 MG tablet Commonly known as:  PERCOCET Take 1 tablet by mouth every 4 (four) hours as needed for pain.   pioglitazone 15 MG tablet Commonly known as:  ACTOS Take 15 mg by mouth at bedtime.   potassium chloride SA 20 MEQ tablet Commonly known as:  K-DUR,KLOR-CON Take 1 tablet (20 mEq total) by mouth daily. For 7 Days      Follow-up Information    Tharon Aquas Trigt III, MD Follow up in 4 week(s).   Specialty:  Cardiothoracic Surgery Why:  Office will contact you with appointment date and time.  Please get CXR 30 min prior to your appointment at [facility] located on first floor of our office building Contact information: [place] [address] [phone]        Bruce J Kowalski, MD. Schedule an appointment as soon as possible for a visit.   Specialty:  Cardiology Why:  Please contact office to set up  hospital follow up for 2 weeks Contact information: [facility] [place] [address] [phone]           Signed: Ellwood Handler 04/15/2017, 12:57 PM

## 2017-04-15 NOTE — Progress Notes (Signed)
Chest tube sutures removed per order. Sites clean and dry. Pt tolerated well. Benzoin and steri-strips applied.   Grant Fontana BSN, RN

## 2017-04-15 NOTE — Discharge Instructions (Signed)
DO NOT THROW AWAY DISCONTINUED MEDICATIONS, PUT THEM ASIDE THEY MAY BE RESTARTED AT A LATER DATE.    Coronary Artery Bypass Grafting, Care After This sheet gives you information about how to care for yourself after your procedure. Your health care provider may also give you more specific instructions. If you have problems or questions, contact your health care provider. What can I expect after the procedure? After the procedure, it is common to have:  Nausea and a lack of appetite.  Constipation.  Weakness and fatigue.  Depression or irritability.  Pain or discomfort in your incision areas. Follow these instructions at home: Medicines   Take over-the-counter and prescription medicines only as told by your health care provider. Do not stop taking medicines or start any new medicines without approval from your health care provider.  If you were prescribed an antibiotic medicine, take it as told by your health care provider. Do not stop taking the antibiotic even if you start to feel better.  Do not drive or use heavy machinery while taking prescription pain medicine. Incision care   Follow instructions from your health care provider about how to take care of your incisions. Make sure you:  Wash your hands with soap and water before you change your bandage (dressing). If soap and water are not available, use hand sanitizer.  Change your dressing as told by your health care provider.  Leave stitches (sutures), skin glue, or adhesive strips in place. These skin closures may need to stay in place for 2 weeks or longer. If adhesive strip edges start to loosen and curl up, you may trim the loose edges. Do not remove adhesive strips completely unless your health care provider tells you to do that.  Keep incision areas clean, dry, and protected.  Check your incision areas every day for signs of infection. Check for:  More redness, swelling, or pain.  More fluid or  blood.  Warmth.  Pus or a bad smell.  If incisions were made in your legs:  Avoid crossing your legs.  Avoid sitting for long periods of time. Change positions every 30 minutes.  Raise (elevate) your legs when you are sitting. Bathing   Do not take baths, swim, or use a hot tub until your health care provider approves.  Only take sponge baths. Pat the incisions dry. Do not rub incisions with a washcloth or towel.  Ask your health care provider when you can shower. Eating and drinking   Eat foods that are high in fiber, such as raw fruits and vegetables, whole grains, beans, and nuts. Meats should be lean cut. Avoid canned, processed, and fried foods. This can help prevent constipation and is a recommended part of a heart-healthy diet.  Drink enough fluid to keep your urine clear or pale yellow.  Limit alcohol intake to no more than 1 drink a day for nonpregnant women and 2 drinks a day for men. One drink equals 12 oz of beer, 5 oz of wine, or 1 oz of hard liquor. Activity   Rest and limit your activity as told by your health care provider. You may be instructed to:  Stop any activity right away if you have chest pain, shortness of breath, irregular heartbeats, or dizziness. Get help right away if you have any of these symptoms.  Move around frequently for short periods or take short walks as directed by your health care provider. Gradually increase your activities. You may need physical therapy or cardiac rehabilitation to help  strengthen your muscles and build your endurance.  Avoid lifting, pushing, or pulling anything that is heavier than 10 lb (4.5 kg) for at least 6 weeks or as told by your health care provider.  Do not drive until your health care provider approves.  Ask your health care provider when you may return to work.  Ask your health care provider when you may resume sexual activity. General instructions   Do not use any products that contain nicotine or  tobacco, such as cigarettes and e-cigarettes. If you need help quitting, ask your health care provider.  Take 2-3 deep breaths every few hours during the day, while you recover. This helps expand your lungs and prevent complications like pneumonia after surgery.  If you were given a device called an incentive spirometer, use it several times a day to practice deep breathing. Support your chest with a pillow or your arms when you take deep breaths or cough.  Wear compression stockings as told by your health care provider. These stockings help to prevent blood clots and reduce swelling in your legs.  Weigh yourself every day. This helps identify if your body is holding (retaining) fluid that may make your heart and lungs work harder.  Keep all follow-up visits as told by your health care provider. This is important. Contact a health care provider if:  You have more redness, swelling, or pain around any incision.  You have more fluid or blood coming from any incision.  Any incision feels warm to the touch.  You have pus or a bad smell coming from any incision  You have a fever.  You have swelling in your ankles or legs.  You have pain in your legs.  You gain 2 lb (0.9 kg) or more a day.  You are nauseous or you vomit.  You have diarrhea. Get help right away if:  You have chest pain that spreads to your jaw or arms.  You are short of breath.  You have a fast or irregular heartbeat.  You notice a "clicking" in your breastbone (sternum) when you move.  You have numbness or weakness in your arms or legs.  You feel dizzy or light-headed. Summary  After the procedure, it is common to have pain or discomfort in the incision areas.  Do not take baths, swim, or use a hot tub until your health care provider approves.  Gradually increase your activities. You may need physical therapy or cardiac rehabilitation to help strengthen your muscles and build your endurance.  Weigh  yourself every day. This helps identify if your body is holding (retaining) fluid that may make your heart and lungs work harder. This information is not intended to replace advice given to you by your health care provider. Make sure you discuss any questions you have with your health care provider. Document Released: 06/23/2005 Document Revised: 10/23/2016 Document Reviewed: 10/23/2016 Elsevier Interactive Patient Education  2017 [employer].   Endoscopic Saphenous Vein Harvesting, Care After Refer to this sheet in the next few weeks. These instructions provide you with information about caring for yourself after your procedure. Your health care provider may also give you more specific instructions. Your treatment has been planned according to current medical practices, but problems sometimes occur. Call your health care provider if you have any problems or questions after your procedure. What can I expect after the procedure? After the procedure, it is common to have:  Pain.  Bruising.  Swelling.  Numbness. Follow these instructions at  home: Medicine   Take over-the-counter and prescription medicines only as told by your health care provider.  Do not drive or operate heavy machinery while taking prescription pain medicine. Incision care    Follow instructions from your health care provider about how to take care of the cut made during surgery (incision). Make sure you:  Wash your hands with soap and water before you change your bandage (dressing). If soap and water are not available, use hand sanitizer.  Change your dressing as told by your health care provider.  Leave stitches (sutures), skin glue, or adhesive strips in place. These skin closures may need to be in place for 2 weeks or longer. If adhesive strip edges start to loosen and curl up, you may trim the loose edges. Do not remove adhesive strips completely unless your health care provider tells you to do that.  Check  your incision area every day for signs of infection. Check for:  More redness, swelling, or pain.  More fluid or blood.  Warmth.  Pus or a bad smell. General instructions   Raise (elevate) your legs above the level of your heart while you are sitting or lying down.  Do any exercises your health care providers have given you. These may include deep breathing, coughing, and walking exercises.  Do not shower, take baths, swim, or use a hot tub unless told by your health care provider.  Wear your elastic stocking if told by your health care provider.  Keep all follow-up visits as told by your health care provider. This is important. Contact a health care provider if:  Medicine does not help your pain.  Your pain gets worse.  You have new leg bruises or your leg bruises get bigger.  You have a fever.  Your leg feels numb.  You have more redness, swelling, or pain around your incision.  You have more fluid or blood coming from your incision.  Your incision feels warm to the touch.  You have pus or a bad smell coming from your incision. Get help right away if:  Your pain is severe.  You develop pain, tenderness, warmth, redness, or swelling in any part of your leg.  You have chest pain.  You have trouble breathing. This information is not intended to replace advice given to you by your health care provider. Make sure you discuss any questions you have with your health care provider. Document Released: 08/16/2011 Document Revised: 05/11/2016 Document Reviewed: 10/18/2015 Elsevier Interactive Patient Education  2017 [employer].

## 2017-04-15 NOTE — Care Management Note (Signed)
Case Management Note Marvetta Gibbons RN, BSN Unit 2W-Case Manager- [place] coverage [phone]  Patient Details  Name: Kenneth Duarte MRN: [id number] Date of Birth: Apr 28, 1946  Subjective/Objective:  Pt admitted s/p CABGx3 on 04/10/17                 Action/Plan: PTA pt lived at home with wife- CM to follow for d/c needs  Expected Discharge Date:  04/15/17               Expected Discharge Plan:     In-House Referral:     Discharge planning Services  CM Consult  Post Acute Care Choice:    Choice offered to:     DME Arranged:    DME Agency:     HH Arranged:    [place] Agency:     Status of Service:  In process, will continue to follow  If discussed at Long Length of Stay Meetings, dates discussed:    Additional Comments: Pt to discharge home today in care of wife.  Wife will provide 24 hour supervision at discharge.  Pt has PCP and denied barriers to obtaining prescription medications.  NO CM Needs determined prior to discharge Maryclare Labrador, RN 04/15/2017, 10:21 AM

## 2017-04-18 ENCOUNTER — Telehealth: Payer: Self-pay | Admitting: Surgical

## 2017-04-18 NOTE — Telephone Encounter (Signed)
[address]       [place] [address]             [id number]    Kenneth Duarte [phone]   S/P                                OPERATIVE REPORT   OPERATIONS: 1. Coronary artery bypass grafting x3 (left internal mammary artery to     LAD, saphenous vein graft to circumflex marginal, saphenous vein     graft to the posterior descending branch of circumflex). 2. Endoscopic harvest of right leg greater saphenous vein.  SURGEON:  Ivin Poot, M.D.  ASSISTANT:  John Giovanni, PA-C.  PREOPERATIVE DIAGNOSIS:  Severe multivessel coronary artery disease, class 4 angina.  POSTOPERATIVE DIAGNOSIS:  Severe multivessel coronary artery disease, class 4 angina.  ANESTHESIA:  General by Dr. Albertha Ghee.   Discharged home on 04/15/17  Medications:  Allergies as of 04/18/2017      Reactions   Cortisone Acetate [cortisone] Rash      Medication List       Accurate as of 04/18/17  2:42 PM. Always use your most recent med list.          acetaminophen 500 MG tablet Commonly known as:  TYLENOL Take 2 tablets (1,000 mg total) by mouth every 6 (six) hours as needed.   alprostadil 20 MCG injection Commonly known as:  EDEX 20 mcg by Intracavitary route as needed for erectile dysfunction. use no more than 3 times per week   aspirin 325 MG EC tablet Take 1 tablet (325 mg total) by mouth daily.   atorvastatin 80 MG tablet Commonly known as:  LIPITOR Take 80 mg by mouth at bedtime.   beclomethasone 40 MCG/ACT inhaler Commonly known as:  QVAR Inhale 1 puff into the lungs 2 (two) times daily.   carvedilol 6.25 MG tablet Commonly known as:  COREG Take 1 tablet (6.25 mg total) by mouth 2 (two) times daily with a meal.   fluticasone 50 MCG/ACT nasal spray Commonly known as:  FLONASE Place 2 sprays into both nostrils 2 (two) times daily.   furosemide 40 MG tablet Commonly known as:  LASIX Take 1 tablet (40 mg total) by mouth daily. For 7  Days   gabapentin 800 MG tablet Commonly known as:  NEURONTIN Take 1,600 mg by mouth 3 (three) times daily.   guaiFENesin 600 MG 12 hr tablet Commonly known as:  MUCINEX Take 600 mg by mouth 2 (two) times daily.   lisinopril 20 MG tablet Commonly known as:  PRINIVIL,ZESTRIL Take 20 mg by mouth daily.   metFORMIN 500 MG 24 hr tablet Commonly known as:  GLUCOPHAGE-XR Take 500 mg by mouth at bedtime.   omeprazole 20 MG capsule Commonly known as:  PRILOSEC Take 20 mg by mouth at bedtime.   oxyCODONE-acetaminophen 10-325 MG tablet Commonly known as:  PERCOCET Take 1 tablet by mouth every 4 (four) hours as needed for pain.   pioglitazone 15 MG tablet Commonly known as:  ACTOS Take 15 mg by mouth at bedtime.   potassium chloride SA 20 MEQ tablet Commonly known as:  K-DUR,KLOR-CON Take 1 tablet (20 mEq total) by mouth daily. For 7 Days       Coumadin:  INR No  Problems/Concerns: states no specific current issues. Ambulating well, no SOB, pain well controlled.  Assessment:  Patient is  doing very well.   Further instructions provided.  Contact office if concerns or problems develop  Follow up Appointment: arranged with surgeon and cardiologist

## 2017-05-15 ENCOUNTER — Other Ambulatory Visit: Payer: Self-pay | Admitting: Cardiothoracic Surgery

## 2017-05-15 DIAGNOSIS — Z951 Presence of aortocoronary bypass graft: Secondary | ICD-10-CM

## 2017-05-16 ENCOUNTER — Ambulatory Visit (INDEPENDENT_AMBULATORY_CARE_PROVIDER_SITE_OTHER): Payer: Self-pay | Admitting: Cardiothoracic Surgery

## 2017-05-16 ENCOUNTER — Ambulatory Visit
Admission: RE | Admit: 2017-05-16 | Discharge: 2017-05-16 | Disposition: A | Discharge status: Home / Self Care | Payer: Medicare PPO | Source: Ambulatory Visit | Attending: Cardiothoracic Surgery | Admitting: Cardiothoracic Surgery

## 2017-05-16 ENCOUNTER — Encounter: Payer: Self-pay | Admitting: Cardiothoracic Surgery

## 2017-05-16 VITALS — BP 118/71 | HR 80 | Resp 20 | Ht 71.0 in | Wt 199.0 lb

## 2017-05-16 DIAGNOSIS — I2510 Atherosclerotic heart disease of native coronary artery without angina pectoris: Secondary | ICD-10-CM

## 2017-05-16 DIAGNOSIS — I209 Angina pectoris, unspecified: Secondary | ICD-10-CM

## 2017-05-16 DIAGNOSIS — Z951 Presence of aortocoronary bypass graft: Secondary | ICD-10-CM

## 2017-05-16 NOTE — Progress Notes (Signed)
PCP is Burns, Ala Dach, MD Referring Provider is Corey Skains, MD  Chief Complaint  Patient presents with  . Routine Post Op    f/u from surgery with CXR s/p CABG x 3    HPI: 1 month follow-up after CABG 3 The patient had severe multivessel CAD with unstable angina He did well after surgery and was discharged home in sinus rhythm He continues to progress and is walking 2 miles per day, no symptoms of recurrent angina Chest x-ray is clear Incisions are healing well Surgical pain is well-controlled, not requiring oxycodone He has not decided whether to proceed with cardiac rehabilitation at [facility] regional. We will make a referral for his orientation. I have recommended formal cardiac rehabilitation to optimize his recovery.  Past Medical History:  Diagnosis Date  . Arthritis   . Chronic pain syndrome   . Congestive heart failure ([place]) 1980  . Coronary artery disease   . Diabetes ([place])    DIET  . Dyspnea    with exertion  . ED (erectile dysfunction)   . GERD (gastroesophageal reflux disease)   . Gout   . Headache   . Hyperlipemia   . Hypertension    CONTROLLED ON MEDS  . Neuropathy of both feet   . Seborrheic keratosis   . Sinus congestion   . Wears dentures    PARTIAL UPPER    Past Surgical History:  Procedure Laterality Date  . COLONOSCOPY WITH PROPOFOL N/A 10/25/2015   Procedure: COLONOSCOPY WITH PROPOFOL;  Surgeon: Lucilla Lame, MD;  Location: [place];  Service: Endoscopy;  Laterality: N/A;  DIABETIC-ORAL MEDS  . CORONARY ARTERY BYPASS GRAFT N/A 04/10/2017   Procedure: CORONARY ARTERY BYPASS GRAFTING (CABG) x three , using left internal mammary artery and right leg greater saphenous vein harvested endoscopically;  Surgeon: Ivin Poot, MD;  Location: [place];  Service: Open Heart Surgery;  Laterality: N/A;  . KNEE SURGERY Right    over 20 years ago  . LEFT HEART CATH AND CORONARY ANGIOGRAPHY Left 02/22/2017   Procedure: Left Heart Cath and  Coronary Angiography;  Surgeon: Corey Skains, MD;  Location: [place] [facility];  Service: Cardiovascular;  Laterality: Left;  . POLYPECTOMY  10/25/2015   Procedure: POLYPECTOMY;  Surgeon: Lucilla Lame, MD;  Location: [place];  Service: Endoscopy;;  . ROTATOR CUFF REPAIR Right 2012  . TEE WITHOUT CARDIOVERSION N/A 04/10/2017   Procedure: TRANSESOPHAGEAL ECHOCARDIOGRAM (TEE);  Surgeon: Ivin Poot, MD;  Location: [place];  Service: Open Heart Surgery;  Laterality: N/A;    Family History  Problem Relation Age of Onset  . Cancer Father   . Heart disease Father   . Cancer Mother   . Heart disease Brother   . Diabetes Brother   . Prostate cancer Brother   . Diabetes Sister   . Bladder Cancer Neg Hx   . Kidney disease Neg Hx     Social History Social History  Substance Use Topics  . Smoking status: Former Smoker    Packs/day: 2.00    Years: 15.00    Types: Cigarettes    Quit date: 06/30/1979  . Smokeless tobacco: Never Used  . Alcohol use No     Comment: QUIT IN 1980    Current Outpatient Prescriptions  Medication Sig Dispense Refill  . acetaminophen (TYLENOL) 500 MG tablet Take 2 tablets (1,000 mg total) by mouth every 6 (six) hours as needed. 30 tablet 0  . alprostadil (EDEX) 20 MCG injection  20 mcg by Intracavitary route as needed for erectile dysfunction. use no more than 3 times per week 1 each 0  . aspirin EC 325 MG EC tablet Take 1 tablet (325 mg total) by mouth daily. 30 tablet 0  . atorvastatin (LIPITOR) 80 MG tablet Take 80 mg by mouth at bedtime.     . beclomethasone (QVAR) 40 MCG/ACT inhaler Inhale 1 puff into the lungs 2 (two) times daily.     . carvedilol (COREG) 6.25 MG tablet Take 1 tablet (6.25 mg total) by mouth 2 (two) times daily with a meal. 60 tablet 3  . fluticasone (FLONASE) 50 MCG/ACT nasal spray Place 2 sprays into both nostrils 2 (two) times daily.    Marland Kitchen gabapentin (NEURONTIN) 800 MG tablet Take 1,600 mg by mouth 3 (three) times daily.     Marland Kitchen guaiFENesin (MUCINEX) 600 MG 12 hr tablet Take 600 mg by mouth 2 (two) times daily.    Marland Kitchen lisinopril (PRINIVIL,ZESTRIL) 20 MG tablet Take 20 mg by mouth daily.     . metFORMIN (GLUCOPHAGE-XR) 500 MG 24 hr tablet Take 500 mg by mouth at bedtime.     Marland Kitchen omeprazole (PRILOSEC) 20 MG capsule Take 20 mg by mouth at bedtime.     Marland Kitchen oxyCODONE-acetaminophen (PERCOCET) 10-325 MG tablet Take 1 tablet by mouth every 4 (four) hours as needed for pain. 40 tablet 0  . pioglitazone (ACTOS) 15 MG tablet Take 15 mg by mouth at bedtime.      No current facility-administered medications for this visit.     Allergies  Allergen Reactions  . Cortisone Acetate [Cortisone] Rash    Review of Systems  Appetite and strength improved No shortness of breath No fever No ankle edema  BP 118/71   Pulse 80   Resp 20   Ht 5\' 11"  (1.803 m)   Wt 199 lb (90.3 kg)   SpO2 99% Comment: RA  BMI 27.75 kg/m  Physical Exam      Exam    General- alert and comfortable   Lungs- clear without rales, wheezes   Cor- regular rate and rhythm, no murmur , gallop   Abdomen- soft, non-tender   Extremities - warm, non-tender, minimal edema   Neuro- oriented, appropriate, no focal weakness   Diagnostic Tests:  Chest x-ray clear, no pleural effusion, mediastinal silhouette normal Impression: Doing well after multivessel CABG He understands he can lift now up to 20 pounds but no more than 20 pounds until August 1 He understands he can drive a car now We will make referral to outpatient cardiac rehabilitation at [facility] regional which is closer to his home  Plan: Continue current meds Continue with recovery under the limitations  noted above Return as needed  Len Childs, MD [facility] [phone]

## 2017-05-17 ENCOUNTER — Encounter: Payer: Self-pay | Admitting: *Deleted

## 2017-05-17 NOTE — Progress Notes (Signed)
A referral was made via [facility] to [facility] to contact Mr. Salak to begin Phase II per the request of Dr. Tharon Aquas Trigt, his surgeon.

## 2017-06-18 ENCOUNTER — Inpatient Hospital Stay (HOSPITAL_COMMUNITY)
Admission: AD | Admit: 2017-06-18 | Discharge: 2017-06-20 | DRG: 247 | Disposition: A | Discharge status: Home / Self Care | Payer: Medicare PPO | Source: Other Acute Inpatient Hospital | Attending: Cardiovascular Disease | Admitting: Cardiovascular Disease

## 2017-06-18 ENCOUNTER — Emergency Department: Payer: Medicare PPO

## 2017-06-18 ENCOUNTER — Encounter
Admission: EM | Disposition: A | Discharge status: Other Acute Inpatient Hospital | Payer: Self-pay | Source: Home / Self Care | Attending: Internal Medicine

## 2017-06-18 ENCOUNTER — Inpatient Hospital Stay
Admission: EM | Admit: 2017-06-18 | Discharge: 2017-06-18 | DRG: 282 | Disposition: A | Discharge status: Other Acute Inpatient Hospital | Payer: Medicare PPO | Attending: Internal Medicine | Admitting: Internal Medicine

## 2017-06-18 DIAGNOSIS — E785 Hyperlipidemia, unspecified: Secondary | ICD-10-CM | POA: Diagnosis present

## 2017-06-18 DIAGNOSIS — Z7982 Long term (current) use of aspirin: Secondary | ICD-10-CM

## 2017-06-18 DIAGNOSIS — I214 Non-ST elevation (NSTEMI) myocardial infarction: Principal | ICD-10-CM | POA: Diagnosis present

## 2017-06-18 DIAGNOSIS — I509 Heart failure, unspecified: Secondary | ICD-10-CM | POA: Diagnosis present

## 2017-06-18 DIAGNOSIS — Z951 Presence of aortocoronary bypass graft: Secondary | ICD-10-CM

## 2017-06-18 DIAGNOSIS — E119 Type 2 diabetes mellitus without complications: Secondary | ICD-10-CM

## 2017-06-18 DIAGNOSIS — I110 Hypertensive heart disease with heart failure: Secondary | ICD-10-CM | POA: Diagnosis not present

## 2017-06-18 DIAGNOSIS — I959 Hypotension, unspecified: Secondary | ICD-10-CM | POA: Diagnosis not present

## 2017-06-18 DIAGNOSIS — M1990 Unspecified osteoarthritis, unspecified site: Secondary | ICD-10-CM | POA: Diagnosis present

## 2017-06-18 DIAGNOSIS — M109 Gout, unspecified: Secondary | ICD-10-CM | POA: Diagnosis present

## 2017-06-18 DIAGNOSIS — Z79899 Other long term (current) drug therapy: Secondary | ICD-10-CM

## 2017-06-18 DIAGNOSIS — I25110 Atherosclerotic heart disease of native coronary artery with unstable angina pectoris: Secondary | ICD-10-CM | POA: Diagnosis not present

## 2017-06-18 DIAGNOSIS — I25810 Atherosclerosis of coronary artery bypass graft(s) without angina pectoris: Secondary | ICD-10-CM

## 2017-06-18 DIAGNOSIS — K219 Gastro-esophageal reflux disease without esophagitis: Secondary | ICD-10-CM | POA: Diagnosis present

## 2017-06-18 DIAGNOSIS — I2510 Atherosclerotic heart disease of native coronary artery without angina pectoris: Secondary | ICD-10-CM | POA: Diagnosis present

## 2017-06-18 DIAGNOSIS — E1142 Type 2 diabetes mellitus with diabetic polyneuropathy: Secondary | ICD-10-CM | POA: Diagnosis present

## 2017-06-18 DIAGNOSIS — Z87891 Personal history of nicotine dependence: Secondary | ICD-10-CM | POA: Diagnosis not present

## 2017-06-18 DIAGNOSIS — I255 Ischemic cardiomyopathy: Secondary | ICD-10-CM | POA: Diagnosis present

## 2017-06-18 DIAGNOSIS — I10 Essential (primary) hypertension: Secondary | ICD-10-CM | POA: Diagnosis present

## 2017-06-18 DIAGNOSIS — D649 Anemia, unspecified: Secondary | ICD-10-CM

## 2017-06-18 DIAGNOSIS — R079 Chest pain, unspecified: Secondary | ICD-10-CM | POA: Diagnosis not present

## 2017-06-18 DIAGNOSIS — G894 Chronic pain syndrome: Secondary | ICD-10-CM | POA: Diagnosis present

## 2017-06-18 HISTORY — PX: LEFT HEART CATH AND CORS/GRAFTS ANGIOGRAPHY: CATH118250

## 2017-06-18 LAB — TROPONIN I
Troponin I: 1.57 ng/mL (ref <0.03)
Troponin I: 6.41 ng/mL (ref <0.03)
Troponin I: 5.95 ng/mL (ref <0.03)

## 2017-06-18 LAB — HEPATIC FUNCTION PANEL
ALT: 18 U/L (ref 17–63)
AST: 34 U/L (ref 15–41)
Albumin: 4 g/dL (ref 3.5–5.0)
Alkaline Phosphatase: 92 U/L (ref 38–126)
Bilirubin, Direct: <0.1 mg/dL — ABNORMAL LOW (ref 0.1–0.5)
Total Bilirubin: 0.9 mg/dL (ref 0.3–1.2)
Total Protein: 7.9 g/dL (ref 6.5–8.1)

## 2017-06-18 LAB — GLUCOSE, CAPILLARY
Glucose-Capillary: 178 mg/dL — ABNORMAL HIGH (ref 65–99)
Glucose-Capillary: 152 mg/dL — ABNORMAL HIGH (ref 65–99)
Glucose-Capillary: 110 mg/dL — ABNORMAL HIGH (ref 65–99)
Glucose-Capillary: 143 mg/dL — ABNORMAL HIGH (ref 65–99)
Glucose-Capillary: 153 mg/dL — ABNORMAL HIGH (ref 65–99)

## 2017-06-18 LAB — BASIC METABOLIC PANEL
Anion gap: 6 (ref 5–15)
BUN: 14 mg/dL (ref 6–20)
CO2: 26 mmol/L (ref 22–32)
Calcium: 9 mg/dL (ref 8.9–10.3)
Chloride: 101 mmol/L (ref 101–111)
Creatinine, Ser: 1.07 mg/dL (ref 0.61–1.24)
GFR calc Af Amer: >60 mL/min (ref >60)
GFR calc non Af Amer: >60 mL/min (ref >60)
Glucose, Bld: 216 mg/dL — ABNORMAL HIGH (ref 65–99)
Potassium: 4.6 mmol/L (ref 3.5–5.1)
Sodium: 133 mmol/L — ABNORMAL LOW (ref 135–145)

## 2017-06-18 LAB — CBC
HCT: 34.8 % — ABNORMAL LOW (ref 40.0–52.0)
Hemoglobin: 11.7 g/dL — ABNORMAL LOW (ref 13.0–18.0)
MCH: 26 pg (ref 26.0–34.0)
MCHC: 33.7 g/dL (ref 32.0–36.0)
MCV: 77.2 fL — ABNORMAL LOW (ref 80.0–100.0)
Platelets: 252 10*3/uL (ref 150–440)
RBC: 4.5 MIL/uL (ref 4.40–5.90)
RDW: 16.4 % — ABNORMAL HIGH (ref 11.5–14.5)
WBC: 7.4 10*3/uL (ref 3.8–10.6)

## 2017-06-18 LAB — APTT: aPTT: 39 seconds — ABNORMAL HIGH (ref 24–36)

## 2017-06-18 LAB — HEPARIN LEVEL (UNFRACTIONATED): Heparin Unfractionated: 0.45 IU/mL (ref 0.30–0.70)

## 2017-06-18 LAB — PROTIME-INR
INR: 1.07
INR: 1.21
Prothrombin Time: 13.9 seconds (ref 11.4–15.2)
Prothrombin Time: 15.4 seconds — ABNORMAL HIGH (ref 11.4–15.2)

## 2017-06-18 LAB — TSH
TSH: 2.827 u[IU]/mL (ref 0.350–4.500)
TSH: 2.814 u[IU]/mL (ref 0.350–4.500)

## 2017-06-18 LAB — MAGNESIUM: Magnesium: 1.7 mg/dL (ref 1.7–2.4)

## 2017-06-18 LAB — MRSA PCR SCREENING
MRSA by PCR: NEGATIVE
MRSA by PCR: NEGATIVE

## 2017-06-18 LAB — CK: Total CK: 303 U/L (ref 49–397)

## 2017-06-18 SURGERY — LEFT HEART CATH AND CORS/GRAFTS ANGIOGRAPHY
Anesthesia: Moderate Sedation

## 2017-06-18 MED ORDER — LISINOPRIL 20 MG PO TABS: 20.0000 mg | ORAL_TABLET | Freq: Every day | ORAL | Status: DC

## 2017-06-18 MED ORDER — HEPARIN (PORCINE) IN NACL 2-0.9 UNIT/ML-% IJ SOLN
INTRAMUSCULAR | Status: AC
  Filled 2017-06-18: qty 1000

## 2017-06-18 MED ORDER — CARVEDILOL 6.25 MG PO TABS
6.2500 mg | ORAL_TABLET | Freq: Two times a day (BID) | ORAL | Status: DC
  Administered 2017-06-18: 6.25 mg via ORAL
  Filled 2017-06-18: qty 1

## 2017-06-18 MED ORDER — DOCUSATE SODIUM 100 MG PO CAPS
100.0000 mg | ORAL_CAPSULE | Freq: Two times a day (BID) | ORAL | Status: DC
  Administered 2017-06-18: 100 mg via ORAL
  Filled 2017-06-18: qty 1

## 2017-06-18 MED ORDER — GABAPENTIN 400 MG PO CAPS
1600.0000 mg | ORAL_CAPSULE | Freq: Three times a day (TID) | ORAL | Status: DC
  Administered 2017-06-18: 1600 mg via ORAL
  Filled 2017-06-18: qty 4

## 2017-06-18 MED ORDER — INSULIN ASPART 100 UNIT/ML ~~LOC~~ SOLN
0.0000 [IU] | Freq: Four times a day (QID) | SUBCUTANEOUS | Status: DC
  Administered 2017-06-18: 2 [IU] via SUBCUTANEOUS
  Filled 2017-06-18: qty 1

## 2017-06-18 MED ORDER — OXYCODONE-ACETAMINOPHEN 5-325 MG PO TABS
1.0000 | ORAL_TABLET | ORAL | Status: DC | PRN
  Administered 2017-06-18 – 2017-06-20 (×6): 1 via ORAL
  Filled 2017-06-18 (×5): qty 1

## 2017-06-18 MED ORDER — SODIUM CHLORIDE 0.9 % WEIGHT BASED INFUSION: 1.0000 mL/kg/h | INTRAVENOUS | Status: DC

## 2017-06-18 MED ORDER — ONDANSETRON HCL 4 MG/2ML IJ SOLN
4.0000 mg | Freq: Four times a day (QID) | INTRAMUSCULAR | Status: DC | PRN
Start: 2017-06-18 — End: 2017-06-18

## 2017-06-18 MED ORDER — BUDESONIDE 0.25 MG/2ML IN SUSP
0.2500 mg | Freq: Two times a day (BID) | RESPIRATORY_TRACT | Status: DC
  Administered 2017-06-18: 0.25 mg via RESPIRATORY_TRACT
  Filled 2017-06-18: qty 2

## 2017-06-18 MED ORDER — FENTANYL CITRATE (PF) 100 MCG/2ML IJ SOLN
INTRAMUSCULAR | Status: AC
  Filled 2017-06-18: qty 2

## 2017-06-18 MED ORDER — ASPIRIN 81 MG PO CHEW
CHEWABLE_TABLET | ORAL | Status: AC
  Filled 2017-06-18: qty 4

## 2017-06-18 MED ORDER — ASPIRIN 81 MG PO CHEW
81.0000 mg | CHEWABLE_TABLET | ORAL | Status: AC
  Administered 2017-06-19: 81 mg via ORAL
  Filled 2017-06-18: qty 1

## 2017-06-18 MED ORDER — ASPIRIN 81 MG PO CHEW: 81.0000 mg | CHEWABLE_TABLET | ORAL | Status: DC

## 2017-06-18 MED ORDER — OXYCODONE HCL 5 MG PO TABS
5.0000 mg | ORAL_TABLET | Freq: Four times a day (QID) | ORAL | Status: DC | PRN
  Administered 2017-06-18: 5 mg via ORAL
  Filled 2017-06-18: qty 1

## 2017-06-18 MED ORDER — FENTANYL CITRATE (PF) 100 MCG/2ML IJ SOLN
INTRAMUSCULAR | Status: DC | PRN
  Administered 2017-06-18: 25 ug via INTRAVENOUS

## 2017-06-18 MED ORDER — OXYCODONE-ACETAMINOPHEN 10-325 MG PO TABS: 1.0000 | ORAL_TABLET | Freq: Four times a day (QID) | ORAL | Status: DC | PRN

## 2017-06-18 MED ORDER — HEPARIN (PORCINE) IN NACL 100-0.45 UNIT/ML-% IJ SOLN
1200.0000 [IU]/h | INTRAMUSCULAR | Status: DC
  Administered 2017-06-18: 1200 [IU]/h via INTRAVENOUS
  Filled 2017-06-18: qty 250

## 2017-06-18 MED ORDER — BECLOMETHASONE DIPROPIONATE 40 MCG/ACT IN AERS: 2.0000 | INHALATION_SPRAY | Freq: Two times a day (BID) | RESPIRATORY_TRACT | Status: DC

## 2017-06-18 MED ORDER — PIOGLITAZONE HCL 15 MG PO TABS
15.0000 mg | ORAL_TABLET | Freq: Every day | ORAL | Status: DC
  Administered 2017-06-18 – 2017-06-19 (×2): 15 mg via ORAL
  Filled 2017-06-18 (×3): qty 1

## 2017-06-18 MED ORDER — ACETAMINOPHEN 500 MG PO TABS: 1000.0000 mg | ORAL_TABLET | Freq: Four times a day (QID) | ORAL | Status: DC | PRN

## 2017-06-18 MED ORDER — LISINOPRIL 20 MG PO TABS
20.0000 mg | ORAL_TABLET | Freq: Every day | ORAL | Status: DC
  Administered 2017-06-18: 20 mg via ORAL
  Filled 2017-06-18: qty 1

## 2017-06-18 MED ORDER — CLOPIDOGREL BISULFATE 75 MG PO TABS: 75.0000 mg | ORAL_TABLET | ORAL | Status: DC

## 2017-06-18 MED ORDER — ATORVASTATIN CALCIUM 80 MG PO TABS
80.0000 mg | ORAL_TABLET | Freq: Every day | ORAL | Status: DC
  Administered 2017-06-18 – 2017-06-19 (×2): 80 mg via ORAL
  Filled 2017-06-18 (×2): qty 1

## 2017-06-18 MED ORDER — SODIUM CHLORIDE 0.9% FLUSH: 3.0000 mL | Freq: Two times a day (BID) | INTRAVENOUS | Status: DC

## 2017-06-18 MED ORDER — ONDANSETRON HCL 4 MG PO TABS: 4.0000 mg | ORAL_TABLET | Freq: Four times a day (QID) | ORAL | Status: DC | PRN

## 2017-06-18 MED ORDER — MIDAZOLAM HCL 2 MG/2ML IJ SOLN
INTRAMUSCULAR | Status: DC | PRN
  Administered 2017-06-18: 1 mg via INTRAVENOUS

## 2017-06-18 MED ORDER — METFORMIN HCL ER 500 MG PO TB24: 500.0000 mg | ORAL_TABLET | Freq: Every day | ORAL | Status: DC

## 2017-06-18 MED ORDER — DOCUSATE SODIUM 100 MG PO CAPS
100.0000 mg | ORAL_CAPSULE | Freq: Two times a day (BID) | ORAL | Status: DC
  Administered 2017-06-19 – 2017-06-20 (×2): 100 mg via ORAL
  Filled 2017-06-18 (×3): qty 1

## 2017-06-18 MED ORDER — NITROGLYCERIN 0.4 MG SL SUBL: 0.4000 mg | SUBLINGUAL_TABLET | SUBLINGUAL | Status: DC | PRN

## 2017-06-18 MED ORDER — NITROGLYCERIN IN D5W 200-5 MCG/ML-% IV SOLN
0.0000 ug/min | Freq: Once | INTRAVENOUS | Status: AC
  Administered 2017-06-18: 5 ug/min via INTRAVENOUS
  Filled 2017-06-18: qty 250

## 2017-06-18 MED ORDER — AMLODIPINE BESYLATE 10 MG PO TABS: 10.0000 mg | ORAL_TABLET | Freq: Every day | ORAL | Status: DC

## 2017-06-18 MED ORDER — SODIUM CHLORIDE 0.9 % IV BOLUS (SEPSIS)
1000.0000 mL | INTRAVENOUS | Status: AC
  Administered 2017-06-18: 1000 mL via INTRAVENOUS

## 2017-06-18 MED ORDER — BUDESONIDE 0.25 MG/2ML IN SUSP
0.2500 mg | Freq: Two times a day (BID) | RESPIRATORY_TRACT | Status: DC
  Administered 2017-06-18 – 2017-06-20 (×4): 0.25 mg via RESPIRATORY_TRACT
  Filled 2017-06-18 (×4): qty 2

## 2017-06-18 MED ORDER — HEPARIN (PORCINE) IN NACL 100-0.45 UNIT/ML-% IJ SOLN: 12.0000 [IU]/kg/h | Freq: Once | INTRAMUSCULAR | Status: DC

## 2017-06-18 MED ORDER — SODIUM CHLORIDE 0.9 % IV SOLN: 250.0000 mL | INTRAVENOUS | Status: DC | PRN

## 2017-06-18 MED ORDER — NITROGLYCERIN 2 % TD OINT
1.0000 [in_us] | TOPICAL_OINTMENT | Freq: Once | TRANSDERMAL | Status: AC
  Administered 2017-06-18: 1 [in_us] via TOPICAL

## 2017-06-18 MED ORDER — IOPAMIDOL (ISOVUE-300) INJECTION 61%
INTRAVENOUS | Status: DC | PRN
  Administered 2017-06-18: 180 mL via INTRA_ARTERIAL

## 2017-06-18 MED ORDER — SODIUM CHLORIDE 0.9 % WEIGHT BASED INFUSION: 3.0000 mL/kg/h | INTRAVENOUS | Status: DC

## 2017-06-18 MED ORDER — ACETAMINOPHEN 325 MG PO TABS: 650.0000 mg | ORAL_TABLET | Freq: Four times a day (QID) | ORAL | Status: DC | PRN

## 2017-06-18 MED ORDER — SODIUM CHLORIDE 0.9 % IV SOLN
INTRAVENOUS | Status: DC
  Administered 2017-06-18: 07:00:00 via INTRAVENOUS

## 2017-06-18 MED ORDER — GABAPENTIN 400 MG PO CAPS
1600.0000 mg | ORAL_CAPSULE | Freq: Three times a day (TID) | ORAL | Status: DC
  Administered 2017-06-18 – 2017-06-20 (×5): 1600 mg via ORAL
  Filled 2017-06-18 (×5): qty 4

## 2017-06-18 MED ORDER — PIOGLITAZONE HCL 15 MG PO TABS: 15.0000 mg | ORAL_TABLET | Freq: Every day | ORAL | Status: DC

## 2017-06-18 MED ORDER — PANTOPRAZOLE SODIUM 40 MG PO TBEC
40.0000 mg | DELAYED_RELEASE_TABLET | Freq: Every day | ORAL | Status: DC
  Administered 2017-06-18: 40 mg via ORAL
  Filled 2017-06-18: qty 1

## 2017-06-18 MED ORDER — ASPIRIN EC 325 MG PO TBEC: 325.0000 mg | DELAYED_RELEASE_TABLET | Freq: Every day | ORAL | Status: DC

## 2017-06-18 MED ORDER — HEPARIN BOLUS VIA INFUSION
4000.0000 [IU] | Freq: Once | INTRAVENOUS | Status: AC
  Administered 2017-06-18: 4000 [IU] via INTRAVENOUS
  Filled 2017-06-18: qty 4000

## 2017-06-18 MED ORDER — MIDAZOLAM HCL 2 MG/2ML IJ SOLN
INTRAMUSCULAR | Status: AC
Start: 2017-06-18 — End: 2017-06-18
  Filled 2017-06-18: qty 2

## 2017-06-18 MED ORDER — ONDANSETRON HCL 4 MG/2ML IJ SOLN: 4.0000 mg | Freq: Four times a day (QID) | INTRAMUSCULAR | Status: DC | PRN

## 2017-06-18 MED ORDER — ASPIRIN EC 325 MG PO TBEC
325.0000 mg | DELAYED_RELEASE_TABLET | Freq: Every day | ORAL | Status: DC
  Administered 2017-06-18: 325 mg via ORAL
  Filled 2017-06-18: qty 1

## 2017-06-18 MED ORDER — SODIUM CHLORIDE 0.9% FLUSH: 3.0000 mL | INTRAVENOUS | Status: DC | PRN

## 2017-06-18 MED ORDER — ACETAMINOPHEN 325 MG PO TABS: 650.0000 mg | ORAL_TABLET | ORAL | Status: DC | PRN

## 2017-06-18 MED ORDER — SODIUM CHLORIDE 0.9% FLUSH
3.0000 mL | Freq: Two times a day (BID) | INTRAVENOUS | Status: DC
  Administered 2017-06-18: 3 mL via INTRAVENOUS

## 2017-06-18 MED ORDER — HEPARIN SODIUM (PORCINE) 5000 UNIT/ML IJ SOLN: 4000.0000 [IU] | Freq: Once | INTRAMUSCULAR | Status: DC

## 2017-06-18 MED ORDER — CLOPIDOGREL BISULFATE 75 MG PO TABS
300.0000 mg | ORAL_TABLET | Freq: Once | ORAL | Status: AC
  Administered 2017-06-18: 300 mg via ORAL
  Filled 2017-06-18: qty 4

## 2017-06-18 MED ORDER — ACETAMINOPHEN 650 MG RE SUPP: 650.0000 mg | Freq: Four times a day (QID) | RECTAL | Status: DC | PRN

## 2017-06-18 MED ORDER — PANTOPRAZOLE SODIUM 40 MG PO TBEC
40.0000 mg | DELAYED_RELEASE_TABLET | Freq: Every day | ORAL | Status: DC
  Administered 2017-06-19 – 2017-06-20 (×2): 40 mg via ORAL
  Filled 2017-06-18 (×2): qty 1

## 2017-06-18 MED ORDER — OXYCODONE-ACETAMINOPHEN 5-325 MG PO TABS
1.0000 | ORAL_TABLET | Freq: Four times a day (QID) | ORAL | Status: DC | PRN
  Administered 2017-06-18: 1 via ORAL
  Filled 2017-06-18: qty 1

## 2017-06-18 MED ORDER — NITROGLYCERIN IN D5W 200-5 MCG/ML-% IV SOLN
0.0000 ug/min | INTRAVENOUS | Status: DC
  Administered 2017-06-18: 30 ug/min via INTRAVENOUS
  Filled 2017-06-18: qty 250

## 2017-06-18 MED ORDER — OXYCODONE HCL 5 MG PO TABS
5.0000 mg | ORAL_TABLET | ORAL | Status: DC | PRN
  Administered 2017-06-18 – 2017-06-20 (×6): 5 mg via ORAL
  Filled 2017-06-18 (×5): qty 1

## 2017-06-18 MED ORDER — ATORVASTATIN CALCIUM 20 MG PO TABS: 80.0000 mg | ORAL_TABLET | Freq: Every day | ORAL | Status: DC

## 2017-06-18 MED ORDER — NITROGLYCERIN 2 % TD OINT
TOPICAL_OINTMENT | TRANSDERMAL | Status: AC
  Filled 2017-06-18: qty 1

## 2017-06-18 MED ORDER — SODIUM CHLORIDE 0.9% FLUSH
3.0000 mL | Freq: Two times a day (BID) | INTRAVENOUS | Status: DC
  Administered 2017-06-18 – 2017-06-19 (×2): 3 mL via INTRAVENOUS

## 2017-06-18 MED ORDER — INSULIN ASPART 100 UNIT/ML ~~LOC~~ SOLN: 0.0000 [IU] | Freq: Every day | SUBCUTANEOUS | Status: DC

## 2017-06-18 MED ORDER — CARVEDILOL 6.25 MG PO TABS
6.2500 mg | ORAL_TABLET | Freq: Two times a day (BID) | ORAL | Status: DC
Start: 2017-06-18 — End: 2017-06-20
  Administered 2017-06-19 – 2017-06-20 (×3): 6.25 mg via ORAL
  Filled 2017-06-18 (×4): qty 1

## 2017-06-18 MED ORDER — METOPROLOL TARTRATE 25 MG PO TABS
25.0000 mg | ORAL_TABLET | Freq: Once | ORAL | Status: AC
  Administered 2017-06-18: 25 mg via ORAL
  Filled 2017-06-18: qty 1

## 2017-06-18 MED ORDER — AMLODIPINE BESYLATE 10 MG PO TABS
10.0000 mg | ORAL_TABLET | Freq: Every day | ORAL | Status: DC
  Administered 2017-06-18: 10 mg via ORAL
  Filled 2017-06-18: qty 1

## 2017-06-18 MED ORDER — ASPIRIN 81 MG PO CHEW
324.0000 mg | CHEWABLE_TABLET | Freq: Once | ORAL | Status: AC
  Administered 2017-06-18: 324 mg via ORAL

## 2017-06-18 MED ORDER — OXYCODONE-ACETAMINOPHEN 10-325 MG PO TABS: 1.0000 | ORAL_TABLET | ORAL | Status: DC | PRN

## 2017-06-18 MED ORDER — SODIUM CHLORIDE 0.9 % WEIGHT BASED INFUSION
1.0000 mL/kg/h | INTRAVENOUS | Status: DC
  Administered 2017-06-18: 1 mL/kg/h via INTRAVENOUS

## 2017-06-18 SURGICAL SUPPLY — 11 items
CATH INFINITI 5 FR IM (CATHETERS) ×2 IMPLANT
CATH INFINITI 5FR ANG PIGTAIL (CATHETERS) ×2 IMPLANT
CATH INFINITI 5FR JL4 (CATHETERS) ×2 IMPLANT
CATH INFINITI JR4 5F (CATHETERS) ×2 IMPLANT
DEVICE CLOSURE MYNXGRIP 5F (Vascular Products) ×2 IMPLANT
KIT MANI 3VAL PERCEP (MISCELLANEOUS) ×2 IMPLANT
NEEDLE PERC 18GX7CM (NEEDLE) ×2 IMPLANT
PACK CARDIAC CATH (CUSTOM PROCEDURE TRAY) ×2 IMPLANT
SHEATH AVANTI 5FR X 11CM (SHEATH) ×2 IMPLANT
WIRE EMERALD 3MM-J .035X150CM (WIRE) ×2 IMPLANT
WIRE EMERALD 3MM-J .035X260CM (WIRE) ×2 IMPLANT

## 2017-06-18 NOTE — ED Triage Notes (Signed)
Pt started having chest pain yesterday at 10am yesterday and 2 nitro sublingual yesterday and eased off yesterday after the nitro and then started again tonight and patient took 4 nitro in a span of 4 hours. Pt has hx of 3x bypass surgery in April.

## 2017-06-18 NOTE — ED Notes (Addendum)
Nitro paste taken off upon the start of nitro drip

## 2017-06-18 NOTE — Progress Notes (Signed)
Dr.Parachos called to verify that the patients Home medications including Coreg, Lisinopril, and Amlodipine are to be given. Verbal order to give the medications.  Will continue to assess.

## 2017-06-18 NOTE — ED Notes (Signed)
Critical Troponin 1.57 called from lab Dr. Karma Greaser made aware.

## 2017-06-18 NOTE — Progress Notes (Signed)
CRITICAL VALUE ALERT  Critical Value:  Troponin  Date & Time Notied:  06/18/17 2100   Provider Notified: NA-expected value. MD to round  Orders Received/Actions taken: NA. Treatment ongoing.

## 2017-06-18 NOTE — Progress Notes (Signed)
ANTICOAGULATION CONSULT NOTE - Initial Consult  Pharmacy Consult for heparin drip Indication: chest pain/ACS  Allergies  Allergen Reactions  . Cortisone Acetate [Cortisone] Rash    Patient Measurements: Height: 5\' 11"  (180.3 cm) Weight: 200 lb (90.7 kg) IBW/kg (Calculated) : 75.3 Heparin Dosing Weight: 91 kg  Vital Signs: BP: 137/75 (07/02 0250) Pulse Rate: 90 (07/02 0250)  Labs:  Recent Labs  06/18/17 0034  HGB 11.7*  HCT 34.8*  PLT 252  APTT 39*  LABPROT 13.9  INR 1.07  CREATININE 1.07  CKTOTAL 303  TROPONINI 1.57*    Estimated Creatinine Clearance: 76.2 mL/min (by C-G formula based on SCr of 1.07 mg/dL).   Medical History: Past Medical History:  Diagnosis Date  . Arthritis   . Chronic pain syndrome   . Congestive heart failure ([place]) 1980  . Coronary artery disease   . Diabetes ([place])    DIET  . Dyspnea    with exertion  . ED (erectile dysfunction)   . GERD (gastroesophageal reflux disease)   . Gout   . Headache   . Hyperlipemia   . Hypertension    CONTROLLED ON MEDS  . Neuropathy of both feet   . Seborrheic keratosis   . Sinus congestion   . Wears dentures    PARTIAL UPPER    Medications:  No anticoagulation in PTA meds.  Assessment:  Goal of Therapy:  Heparin level 0.3-0.7 units/ml Monitor platelets by anticoagulation protocol: Yes   Plan:  4000 unit bolus and initial rate of 1200 units/hr. First heparin level 6 hours after start of infusion..  Annistyn Depass S 06/18/2017,3:01 AM

## 2017-06-18 NOTE — Progress Notes (Signed)
ANTICOAGULATION CONSULT NOTE - Follow Up Consult  Pharmacy Consult for Heparin Indication: ACS/NSTEMI  Allergies  Allergen Reactions  . Blood-Group Specific Substance Other (See Comments)    Patient received almost 2 units FFP during a procedure. Became hypotensive and developed rash  . Cortisone Acetate [Cortisone] Rash    Patient Measurements: Height: 6\' 1"  (185.4 cm) Weight: 193 lb 2 oz (87.6 kg) IBW/kg (Calculated) : 79.9 Heparin Dosing Weight: 88 kg  Vital Signs: Temp: 98.7 F (37.1 C) (07/02 1711) Temp Source: Oral (07/02 1711) BP: 114/62 (07/02 1800) Pulse Rate: 85 (07/02 1800)  Labs:  Recent Labs  06/18/17 0034 06/18/17 1007  HGB 11.7*  --   HCT 34.8*  --   PLT 252  --   APTT 39*  --   LABPROT 13.9  --   INR 1.07  --   HEPARINUNFRC  --  0.45  CREATININE 1.07  --   CKTOTAL 303  --   TROPONINI 1.57* 6.41*    Estimated Creatinine Clearance: 74.7 mL/min (by C-G formula based on SCr of 1.07 mg/dL).  Assessment:   71 yr old male transferred from [facility] to [facility] s/p cardiac cath today. For PCI on 06/19/17.   Arrived with Heparin drip running at 1200 units/hr.     Last heparin level was therapeutic (0.45) ~10am today on 1200 units/hr.    Per [facility] charting, drip resumed post-cath at 3pm.    Closure device used after sheath pull ~2:15pm.  No bleeding or hematoma noted.    Goal of Therapy:  Heparin level 0.3-0.7 units/ml Monitor platelets by anticoagulation protocol: Yes   Plan:   Continue heparin drip at 1200 units/hr.  Heparin level ~70midnight, with next q6h troponin.  Daily heparin level and CBC while on heparin.  Arty Baumgartner, [place] Pager: [phone] 06/18/2017,6:18 PM

## 2017-06-18 NOTE — H&P (Signed)
Patient ID: Kenneth Duarte MRN: [id number], DOB/AGE: 69-Dec-1947   Admit date: 06/18/2017   Primary Physician: Ellamae Sia, MD Primary Cardiologist: Dr. Nehemiah Massed Reason for admission: NSTEMI  HPI:  Kenneth Duarte is a 71 y.o. male with a history of CAD s/p 3V CABG (03/2017), DMT2, HTN, HLD who presented to [facility] hospital today with chest pain and found to have NSTEMI. Cath showed severe 3V CAD with 75% occl LIMA--> LAD near anastomotic site, occl SVG--> PDA and 95% occl SVG--> OM1 and 95% occl of native vessel distal to anastomotic site. He was transferred to [facility] for high risk PCI vs redo CABG.  He was initially seen in 02/2017 for angina and equivolcal stress test. He continue to have chest pain and underwent cath in 02/2017. This showed severe 2V CAD. There was significant stenosis of RCA and left PDA with moderate stenosis of left main and LAD. Medical management was recommended.  The patient's anti-anginal medications were increased, which unfortunately did not provided relief.  Due to this the patient was referred to [facility] for possible coronary bypass procedure.   He underwent 3V CABG with LIMA to LAD, SVG to OM, and SVG to PDA by Dr. Nils Pyle in 03/2017.   He was doing okay until ~2 days ago when he developed substernal chest pain that was relieved by SL NTG. He continue to have chest pain prompting him to present to [facility] where he was found to have ST depression in lateral leads and elevated troponin @ 1.57--> 6.41. He was taken back for cath today which showed severe 3V CAD with 75% occl LIMA--> LAD near anastomotic site, occl SVG--> PDA and 95% occl SVG--> OM1 and 95% occl of native vessel distal to anastomotic site. He was transferred to [facility] for high risk PCI vs redo CABG.  He is currently chest pain free on IV NTG. Asking for his home pain meds to be re ordered. No LE edema, orthopnea or PND. No dizziness or syncope. No blood in stool or urine. No palpitations.      Problem List  Past Medical History:  Diagnosis Date  . Arthritis   . Chronic pain syndrome   . Congestive heart failure ([place]) 1980  . Coronary artery disease   . Diabetes ([place])    DIET  . Dyspnea    with exertion  . ED (erectile dysfunction)   . GERD (gastroesophageal reflux disease)   . Gout   . Headache   . Hyperlipemia   . Hypertension    CONTROLLED ON MEDS  . Neuropathy of both feet   . Seborrheic keratosis   . Sinus congestion   . Wears dentures    PARTIAL UPPER    Past Surgical History:  Procedure Laterality Date  . COLONOSCOPY WITH PROPOFOL N/A 10/25/2015   Procedure: COLONOSCOPY WITH PROPOFOL;  Surgeon: Lucilla Lame, MD;  Location: [place];  Service: Endoscopy;  Laterality: N/A;  DIABETIC-ORAL MEDS  . CORONARY ARTERY BYPASS GRAFT N/A 04/10/2017   Procedure: CORONARY ARTERY BYPASS GRAFTING (CABG) x three , using left internal mammary artery and right leg greater saphenous vein harvested endoscopically;  Surgeon: Ivin Poot, MD;  Location: [place];  Service: Open Heart Surgery;  Laterality: N/A;  . CORONARY ARTERY BYPASS GRAFT    . KNEE SURGERY Right    over 20 years ago  . LEFT HEART CATH AND CORONARY ANGIOGRAPHY Left 02/22/2017   Procedure: Left Heart Cath and Coronary Angiography;  Surgeon: Darnell Level  Kelli Hope, MD;  Location: [place] CV LAB;  Service: Cardiovascular;  Laterality: Left;  . LEFT HEART CATH AND CORS/GRAFTS ANGIOGRAPHY N/A 06/18/2017   Procedure: Left Heart Cath and Cors/Grafts Angiography and PCI;  Surgeon: Isaias Cowman, MD;  Location: [place] CV LAB;  Service: Cardiovascular;  Laterality: N/A;  . POLYPECTOMY  10/25/2015   Procedure: POLYPECTOMY;  Surgeon: Lucilla Lame, MD;  Location: [place];  Service: Endoscopy;;  . ROTATOR CUFF REPAIR Right 2012  . TEE WITHOUT CARDIOVERSION N/A 04/10/2017   Procedure: TRANSESOPHAGEAL ECHOCARDIOGRAM (TEE);  Surgeon: Ivin Poot, MD;  Location: [place];  Service: Open Heart  Surgery;  Laterality: N/A;     Allergies  Allergies  Allergen Reactions  . Blood-Group Specific Substance Other (See Comments)    Patient received almost 2 units FFP during a procedure. Became hypotensive and developed rash  . Cortisone Acetate [Cortisone] Rash     Home Medications  Prior to Admission medications   Medication Sig Start Date End Date Taking? Authorizing Provider  acetaminophen (TYLENOL) 500 MG tablet Take 2 tablets (1,000 mg total) by mouth every 6 (six) hours as needed. Patient taking differently: Take 1,000 mg by mouth every 6 (six) hours as needed for mild pain, fever or headache.  04/15/17   Barrett, Erin R, PA-C  amLODipine (NORVASC) 10 MG tablet Take 10 mg by mouth daily. 05/29/17   [provider]  aspirin EC 325 MG EC tablet Take 1 tablet (325 mg total) by mouth daily. 04/15/17   Barrett, Erin R, PA-C  atorvastatin (LIPITOR) 80 MG tablet Take 80 mg by mouth at bedtime.     [provider]  beclomethasone (QVAR) 40 MCG/ACT inhaler Inhale 2 puffs into the lungs 2 (two) times daily.     [provider]  carvedilol (COREG) 6.25 MG tablet Take 6.25 mg by mouth 2 (two) times daily. 04/15/17   [provider]  docusate sodium (COLACE) 100 MG capsule Take 100 mg by mouth 2 (two) times daily.    [provider]  gabapentin (NEURONTIN) 800 MG tablet Take 1,600 mg by mouth 3 (three) times daily.    [provider]  lisinopril (PRINIVIL,ZESTRIL) 20 MG tablet Take 20 mg by mouth daily.     [provider]  nitroGLYCERIN (NITROSTAT) 0.4 MG SL tablet Place 0.4 mg under the tongue every 5 (five) minutes as needed for chest pain.    [provider]  omeprazole (PRILOSEC) 20 MG capsule Take 20 mg by mouth at bedtime.     [provider]  oxyCODONE-acetaminophen (PERCOCET) 10-325 MG tablet Take 1 tablet by mouth every 4 (four) hours as needed for pain. 04/15/17   Barrett, Erin R, PA-C  pioglitazone (ACTOS)  15 MG tablet Take 15 mg by mouth at bedtime.     [provider]    Family History  Family History  Problem Relation Age of Onset  . Cancer Father   . Heart disease Father   . Cancer Mother   . Heart disease Brother   . Diabetes Brother   . Prostate cancer Brother   . Diabetes Sister   . Bladder Cancer Neg Hx   . Kidney disease Neg Hx    Family Status  Relation Status  . Father (Not Specified)  . Mother (Not Specified)  . Brother (Not Specified)  . Sister (Not Specified)  . Neg Hx (Not Specified)     Social History  Social History   Social History  .  Marital status: Married    Spouse name: N/A  . Number of children: N/A  . Years of education: N/A   Occupational History  . Not on file.   Social History Main Topics  . Smoking status: Former Smoker    Packs/day: 2.00    Years: 15.00    Types: Cigarettes    Quit date: 06/30/1979  . Smokeless tobacco: Never Used  . Alcohol use No     Comment: QUIT IN 1980  . Drug use: No  . Sexual activity: Not on file   Other Topics Concern  . Not on file   Social History Narrative  . No narrative on file     Review of Systems General:  No chills, fever, night sweats or weight changes.  Cardiovascular:  ++ chest pain, ++dyspnea on exertion, no edema, orthopnea, palpitations, paroxysmal nocturnal dyspnea. Dermatological: No rash, lesions/masses Respiratory: No cough, dyspnea Urologic: No hematuria, dysuria Abdominal:   No nausea, vomiting, diarrhea, bright red blood per rectum, melena, or hematemesis Neurologic:  No visual changes, wkns, changes in mental status. All other systems reviewed and are otherwise negative except as noted above.  Physical Exam  Blood pressure 120/64, pulse 85, temperature 98.7 F (37.1 C), temperature source Oral, resp. rate 14, height 6\' 1"  (1.854 m), weight 193 lb 2 oz (87.6 kg), SpO2 98 %.  General: Pleasant, NAD, obese Psych: Normal affect. Neuro: Alert and oriented X 3.  Moves all extremities spontaneously. HEENT: Normal  Neck: Supple without bruits or JVD. Lungs:  Resp regular and unlabored, CTA. Heart: RRR no s3, s4, or murmurs. Abdomen: Soft, non-tender, non-distended, BS + x 4.  Extremities: No clubbing, cyanosis or edema. DP/PT/Radials 2+ and equal bilaterally.  Labs   Recent Labs  06/18/17 0034 06/18/17 1007  CKTOTAL 303  --   TROPONINI 1.57* 6.41*   Lab Results  Component Value Date   WBC 7.4 06/18/2017   HGB 11.7 (L) 06/18/2017   HCT 34.8 (L) 06/18/2017   MCV 77.2 (L) 06/18/2017   PLT 252 06/18/2017     Recent Labs Lab 06/18/17 0034  NA 133*  K 4.6  CL 101  CO2 26  BUN 14  CREATININE 1.07  CALCIUM 9.0  PROT 7.9  BILITOT 0.9  ALKPHOS 92  ALT 18  AST 34  GLUCOSE 216*   No results found for: CHOL, HDL, LDLCALC, TRIG No results found for: DDIMER   Radiology/Studies  Dg Chest 2 View  Result Date: 06/18/2017 CLINICAL DATA:  Chest pain. EXAM: CHEST  2 VIEW COMPARISON:  05/16/2017 FINDINGS: Post median sternotomy and CABG. Unchanged heart size and mediastinal contours, borderline cardiomegaly. Pulmonary vasculature is normal. Streaky bibasilar atelectasis. No confluent consolidation, pleural effusion, or pneumothorax. No acute osseous abnormalities are seen. IMPRESSION: Bibasilar atelectasis. Post CABG with borderline cardiomegaly. Electronically Signed   By: Jeb Levering M.D.   On: 06/18/2017 01:09   CATH 06/18/17 Left Heart Cath and Cors/Grafts Angiography and PCI  Conclusion    Ost Cx to Prox Cx lesion, 100 %stenosed.  Ost LM lesion, 50 %stenosed.  Ost LAD lesion, 30 %stenosed.  Mid RCA lesion, 95 %stenosed.  SVG.  Origin lesion, 100 %stenosed.  SVG.  Origin to Prox Graft lesion, 99 %stenosed.  Dist Graft to Insertion lesion, 60 %stenosed.  1st Mrg lesion, 99 %stenosed.  LIMA.  Dist Graft lesion, 75 %stenosed.   1. Severe three-vessel coronary artery disease with heavily calcified, eccentric  50-60% stenosis distal left main, occluded proximal left circumflex, subtotal  mid RCA. 2. Patent LIMA to mid LAD with discrete 75% stenosis near anastomotic site 3. Occluded SVG to PDA 4. Patent SVG to OM1 with a long diffuse 95% stenosis proximal segment of graft, with near 60-70% stenosis at the anastomotic site, 95% stenosis in the native vessel distal to the anastomotic site, with retrograde filling of left circumflex 5. Mildly reduced left ventricular function, with inferior-posterior wall akinesis  Recommendations  1. Transfer to [facility] for high risk, technically challenging PCI versus redo CABG      ECG  Normal sinus rhythm, IRBB, marked ST abnormalities with ST depression in inferolateral leads, prolonged QT  ASSESSMENT AND PLAN  Elgie Suleman Gunning is a 71 y.o. male with a history of CAD s/p 3V CABG (03/2017), DMT2, HTN, HLD who presented to [facility] hospital today with chest pain and found to have NSTEMI. Cath showed severe 3V CAD with 75% occl LIMA--> LAD near anastomotic site, occl SVG--> PDA and 95% occl SVG--> OM1 and 95% occl of native vessel distal to anastomotic site. He was transferred to [facility] for high risk PCI vs redo CABG  NSTEMI: plan is for heart cath tomorrow with Dr. Irish Lack. Will place orders. Continue IV heparin and nitro.   CAD s/p CABG x3V: continue ASA 325mg  daily, statin and BB  HTN: resume homed meds.   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 I agree with the assessment and plan as outlined above. He is transferred here today from [facility] for planned PCI of the SVG to OM tomorrow. Admitted there with a NSTEMI. Cardiac cath per Dr. Saralyn Pilar. Recent CABG. SVG to RCA occluded. SVG to OM patent with severe proximal body stenosis. LAD has moderate disease. The LIMA to the LAD is patent with moderate anastomotic  disease. He has no chest pain. EKG reviewed by me from today at [facility] shows sinus with diffuse ST depression and T wave inversions. Labs reviewed by me today.  My exam shows:  General: Well developed, well nourished, NAD  HEENT: OP clear, mucus membranes moist  SKIN: warm, dry. No rashes. Neuro: No focal deficits  Musculoskeletal: Muscle strength 5/5 all ext  Psychiatric: Mood and affect normal  Neck: No JVD, no carotid bruits, no thyromegaly, no lymphadenopathy.  Lungs:Clear bilaterally, no wheezes, rhonci, crackles Cardiovascular: Regular rate and rhythm. No murmurs, gallops or rubs. Abdomen:Soft. Bowel sounds present. Non-tender.  Extremities: No lower extremity edema. Pulses are 2 + in the bilateral DP/PT. Plan: Will plan PCI of the SVG to OM tomorrow. Medical management of other disease. I have spent 20 minutes tonight with the pt and his family reviewing the severity of his CAD and options.  NPO at MN.  Continue IV heparin and IV NTG.   Lauree Chandler 06/18/2017 5:49 PM

## 2017-06-18 NOTE — Discharge Summary (Signed)
[facility] [place] at [facility], 71 y.o., DOB March 28, 1946, MRN [id number]. 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 Patient was brought to the emergency room and noted to have inferior ST depression as well as troponin elevation consistent with a non-ST MI. He was admitted to the ICU placed on a heparin drip and nitroglycerin drip. He was seen in consultation by cardiology who performed performed a cardiac catheterization. The cardiac catheterization showed complex coronary anatomy. Dr. Saralyn Pilar, who made arrangements for patient to be transferred to [facility] where he had his surgery with CABG.             Consults  cardiology  Significant Tests:  See full reports for all details     Dg Chest 2 View  Result Date: 06/18/2017 CLINICAL DATA:  Chest pain. EXAM: CHEST  2 VIEW COMPARISON:  05/16/2017 FINDINGS: Post median sternotomy and CABG. Unchanged heart size and mediastinal contours, borderline cardiomegaly. Pulmonary vasculature is normal. Streaky bibasilar atelectasis. No confluent consolidation, pleural effusion, or pneumothorax. No acute osseous abnormalities are seen. IMPRESSION: Bibasilar atelectasis. Post CABG with borderline cardiomegaly. Electronically Signed   By: Jeb Levering M.D.    On: 06/18/2017 01:09       Today   Subjective:   Kenneth Duarte  patient denies any chest pain currently or shortness of breath  Objective:   Blood pressure 138/79, pulse 82, temperature 98.2 F (36.8 C), temperature source Oral, resp. rate 14, height 5\' 11"  (1.803 m), weight 199 lb (90.3 kg), SpO2 97 %.  .  Intake/Output Summary (Last 24 hours) at 06/18/17 1444 Last data filed at 06/18/17 1000  Gross per 24 hour  Intake           448.32 ml  Output              350 ml  Net            98.32 ml    Exam VITAL SIGNS: Blood pressure 138/79, pulse 82, temperature 98.2 F (36.8 C), temperature source Oral, resp. rate 14, height 5\' 11"  (1.803 m), weight 199 lb (90.3 kg), SpO2 97 %.  GENERAL:  71 y.o.-year-old patient lying in the bed with no acute distress.  EYES: Pupils equal, round, reactive to light and accommodation. No scleral icterus. Extraocular muscles intact.  HEENT: Head atraumatic, normocephalic. Oropharynx and nasopharynx clear.  NECK:  Supple, no jugular venous distention. No thyroid enlargement, no tenderness.  LUNGS: Normal breath sounds bilaterally, no wheezing, rales,rhonchi or crepitation. No use of accessory muscles of respiration.  CARDIOVASCULAR: S1, S2 normal. No murmurs, rubs, or gallops.  ABDOMEN: Soft, nontender, nondistended. Bowel sounds present. No organomegaly or mass.  EXTREMITIES: No pedal edema, cyanosis, or clubbing.  NEUROLOGIC: Cranial nerves II through XII are intact. Muscle strength 5/5 in all extremities. Sensation intact. Gait not checked.  PSYCHIATRIC: The patient  is alert and oriented x 3.  SKIN: No obvious rash, lesion, or ulcer.   Data Review     CBC w Diff:  Lab Results  Component Value Date   WBC 7.4 06/18/2017   HGB 11.7 (L) 06/18/2017   HGB 13.4 03/06/2014   HCT 34.8 (L) 06/18/2017   HCT 39.2 (L) 03/06/2014   PLT 252 06/18/2017   PLT 198 03/06/2014   CMP:  Lab Results  Component Value Date   NA 133 (L) 06/18/2017   NA 137  03/06/2014   K 4.6 06/18/2017   K 3.8 03/06/2014   CL 101 06/18/2017   CL 106 03/06/2014   CO2 26 06/18/2017   CO2 29 03/06/2014   BUN 14 06/18/2017   BUN 8 03/06/2014   CREATININE 1.07 06/18/2017   CREATININE 0.96 03/06/2014   PROT 7.9 06/18/2017   PROT 7.6 03/06/2014   ALBUMIN 4.0 06/18/2017   ALBUMIN 3.8 03/06/2014   BILITOT 0.9 06/18/2017   BILITOT 0.4 03/06/2014   ALKPHOS 92 06/18/2017   ALKPHOS 79 03/06/2014   AST 34 06/18/2017   AST 21 03/06/2014   ALT 18 06/18/2017   ALT 26 03/06/2014  .  Micro Results Recent Results (from the past 240 hour(s))  MRSA PCR Screening     Status: None   Collection Time: 06/18/17  4:21 AM  Result Value Ref Range Status   MRSA by PCR NEGATIVE NEGATIVE Final    Comment:        The GeneXpert MRSA Assay (FDA approved for NASAL specimens only), is one component of a comprehensive MRSA colonization surveillance program. It is not intended to diagnose MRSA infection nor to guide or monitor treatment for MRSA infections.         Code Status Orders        Start     Ordered   06/18/17 0425  Full code  Continuous     06/18/17 0424    Code Status History    Date Active Date Inactive Code Status Order ID Comments User Context   04/10/2017  2:25 PM 04/15/2017  2:35 PM Full Code [id number]  John Giovanni, PA-C Inpatient            Discharge Medications   Allergies as of 06/18/2017      Reactions   Blood-group Specific Substance Other (See Comments)   Patient received almost 2 units FFP during a procedure. Became hypotensive and developed rash   Cortisone Acetate [cortisone] Rash      Medication List    STOP taking these medications   metFORMIN 500 MG 24 hr tablet Commonly known as:  GLUCOPHAGE-XR     TAKE these medications   acetaminophen 500 MG tablet Commonly known as:  TYLENOL Take 2 tablets (1,000 mg total) by mouth every 6 (six) hours as needed. What changed:  reasons to take this   amLODipine 10 MG  tablet Commonly known as:  NORVASC Take 10 mg by mouth daily.   aspirin 325 MG EC tablet Take 1 tablet (325 mg total) by mouth daily.   atorvastatin 80 MG tablet Commonly known as:  LIPITOR Take 80 mg by mouth at bedtime.   beclomethasone 40 MCG/ACT inhaler Commonly known as:  QVAR Inhale 2 puffs into the lungs 2 (two) times daily.   carvedilol 6.25 MG tablet Commonly known as:  COREG Take 6.25 mg by mouth 2 (two) times daily.   docusate sodium 100 MG capsule Commonly known as:  COLACE Take 100  mg by mouth 2 (two) times daily.   gabapentin 800 MG tablet Commonly known as:  NEURONTIN Take 1,600 mg by mouth 3 (three) times daily.   lisinopril 20 MG tablet Commonly known as:  PRINIVIL,ZESTRIL Take 20 mg by mouth daily.   nitroGLYCERIN 0.4 MG SL tablet Commonly known as:  NITROSTAT Place 0.4 mg under the tongue every 5 (five) minutes as needed for chest pain.   omeprazole 20 MG capsule Commonly known as:  PRILOSEC Take 20 mg by mouth at bedtime.   oxyCODONE-acetaminophen 10-325 MG tablet Commonly known as:  PERCOCET Take 1 tablet by mouth every 4 (four) hours as needed for pain.   pioglitazone 15 MG tablet Commonly known as:  ACTOS Take 15 mg by mouth at bedtime.          Total Time in preparing paper work, data evaluation and todays exam - 35 minutes  Dustin Flock M.D on 06/18/2017 at 2:44 PM  [facility]   Office  [phone]

## 2017-06-18 NOTE — Progress Notes (Signed)
Dr.Simmonds notified of Pt troponin of 6.41. Dr.Simmonds acknowledged. No new orders at this time. Will continue to assess.

## 2017-06-18 NOTE — Progress Notes (Signed)
ANTICOAGULATION CONSULT NOTE - Initial Consult  Pharmacy Consult for heparin drip Indication: chest pain/ACS  Allergies  Allergen Reactions  . Cortisone Acetate [Cortisone] Rash    Patient Measurements: Height: 5\' 11"  (180.3 cm) Weight: 199 lb 15.3 oz (90.7 kg) IBW/kg (Calculated) : 75.3 Heparin Dosing Weight: 91 kg  Vital Signs: Temp: 98.1 F (36.7 C) (07/02 0800) Temp Source: Oral (07/02 0800) BP: 122/68 (07/02 1000) Pulse Rate: 74 (07/02 1000)  Labs:  Recent Labs  06/18/17 0034 06/18/17 1007  HGB 11.7*  --   HCT 34.8*  --   PLT 252  --   APTT 39*  --   LABPROT 13.9  --   INR 1.07  --   HEPARINUNFRC  --  0.45  CREATININE 1.07  --   CKTOTAL 303  --   TROPONINI 1.57* 6.41*    Estimated Creatinine Clearance: 76.2 mL/min (by C-G formula based on SCr of 1.07 mg/dL).   Medical History: Past Medical History:  Diagnosis Date  . Arthritis   . Chronic pain syndrome   . Congestive heart failure ([place]) 1980  . Coronary artery disease   . Diabetes ([place])    DIET  . Dyspnea    with exertion  . ED (erectile dysfunction)   . GERD (gastroesophageal reflux disease)   . Gout   . Headache   . Hyperlipemia   . Hypertension    CONTROLLED ON MEDS  . Neuropathy of both feet   . Seborrheic keratosis   . Sinus congestion   . Wears dentures    PARTIAL UPPER   Assessment: 71 y/o M with a h/o CABG 4/18 admitted with NSTEMI.   Goal of Therapy:  Heparin level 0.3-0.7 units/ml Monitor platelets by anticoagulation protocol: Yes   Plan:  Will continue heparin drip at current rate and f/u after cath.  Ulice Dash D 06/18/2017,10:49 AM

## 2017-06-18 NOTE — Plan of Care (Signed)
Problem: Activity: Goal: Ability to return to baseline activity level will improve Outcome: Progressing Pt tolerating movement without SOB  Problem: Cardiovascular: Goal: Ability to achieve and maintain adequate cardiovascular perfusion will improve Outcome: Progressing Within parameters Goal: Vascular access site(s) Level 0-1 will be maintained Outcome: Progressing Right groin level 0  Problem: Education: Goal: Understanding of CV disease, CV risk reduction, and recovery process will improve Outcome: Progressing Education ongoing

## 2017-06-18 NOTE — H&P (Signed)
Kenneth Duarte is an 71 y.o. male.   Chief Complaint: Chest pain HPI: The patient with past medical history significant for coronary artery disease status post three-vessel CABG in April 2018, ischemic cardiomyopathy, hypertension and diabetes presents to the emergency department complaining of chest pain. The patient states that his pain began 2 days ago but was relieved after approximately 10 minutes following the consecutive use of 2 nitroglycerin tablets. He had been sitting outside and attributed his symptoms to overheating but his chest pain returned yesterday. He took 2 nitroglycerin pills which really alleviated his symptoms. He tried to sit down and recline some to rest but his pain did not go away which prompted him to seek evaluation in the emergency department. The patient denied shortness of breath, nausea, vomiting or diaphoresis. His pain is substernal and radiates slightly into his left chest. It is pressure in character. In the emergency department the patient was found to have elevated troponin as well as ST depression in inferior leads consistent with myocardial ischemia. He was loaded with Plavix and started on a nitroglycerin drip as well as heparin prior to the emergency department staff called the hospitalist service for admission.  Past Medical History:  Diagnosis Date  . Arthritis   . Chronic pain syndrome   . Congestive heart failure ([place]) 1980  . Coronary artery disease   . Diabetes ([place])    DIET  . Dyspnea    with exertion  . ED (erectile dysfunction)   . GERD (gastroesophageal reflux disease)   . Gout   . Headache   . Hyperlipemia   . Hypertension    CONTROLLED ON MEDS  . Neuropathy of both feet   . Seborrheic keratosis   . Sinus congestion   . Wears dentures    PARTIAL UPPER    Past Surgical History:  Procedure Laterality Date  . COLONOSCOPY WITH PROPOFOL N/A 10/25/2015   Procedure: COLONOSCOPY WITH PROPOFOL;  Surgeon: Lucilla Lame, MD;  Location: [place];  Service: Endoscopy;  Laterality: N/A;  DIABETIC-ORAL MEDS  . CORONARY ARTERY BYPASS GRAFT N/A 04/10/2017   Procedure: CORONARY ARTERY BYPASS GRAFTING (CABG) x three , using left internal mammary artery and right leg greater saphenous vein harvested endoscopically;  Surgeon: Ivin Poot, MD;  Location: [place];  Service: Open Heart Surgery;  Laterality: N/A;  . KNEE SURGERY Right    over 20 years ago  . LEFT HEART CATH AND CORONARY ANGIOGRAPHY Left 02/22/2017   Procedure: Left Heart Cath and Coronary Angiography;  Surgeon: Corey Skains, MD;  Location: [place] [facility];  Service: Cardiovascular;  Laterality: Left;  . POLYPECTOMY  10/25/2015   Procedure: POLYPECTOMY;  Surgeon: Lucilla Lame, MD;  Location: [place];  Service: Endoscopy;;  . ROTATOR CUFF REPAIR Right 2012  . TEE WITHOUT CARDIOVERSION N/A 04/10/2017   Procedure: TRANSESOPHAGEAL ECHOCARDIOGRAM (TEE);  Surgeon: Ivin Poot, MD;  Location: [place];  Service: Open Heart Surgery;  Laterality: N/A;    Family History  Problem Relation Age of Onset  . Cancer Father   . Heart disease Father   . Cancer Mother   . Heart disease Brother   . Diabetes Brother   . Prostate cancer Brother   . Diabetes Sister   . Bladder Cancer Neg Hx   . Kidney disease Neg Hx    Social History:  reports that he quit smoking about 37 years ago. His smoking use included Cigarettes. He has a 30.00 pack-year smoking history.  He has never used smokeless tobacco. He reports that he does not drink alcohol or use drugs.  Allergies:  Allergies  Allergen Reactions  . Cortisone Acetate [Cortisone] Rash    Medications Prior to Admission  Medication Sig Dispense Refill  . acetaminophen (TYLENOL) 500 MG tablet Take 2 tablets (1,000 mg total) by mouth every 6 (six) hours as needed. (Patient taking differently: Take 1,000 mg by mouth every 6 (six) hours as needed for mild pain, fever or headache. ) 30 tablet 0  . amLODipine (NORVASC)  10 MG tablet Take 10 mg by mouth daily.    Marland Kitchen aspirin EC 325 MG EC tablet Take 1 tablet (325 mg total) by mouth daily. 30 tablet 0  . atorvastatin (LIPITOR) 80 MG tablet Take 80 mg by mouth at bedtime.     . beclomethasone (QVAR) 40 MCG/ACT inhaler Inhale 2 puffs into the lungs 2 (two) times daily.     . carvedilol (COREG) 6.25 MG tablet Take 6.25 mg by mouth 2 (two) times daily.    Marland Kitchen docusate sodium (COLACE) 100 MG capsule Take 100 mg by mouth 2 (two) times daily.    Marland Kitchen gabapentin (NEURONTIN) 800 MG tablet Take 1,600 mg by mouth 3 (three) times daily.    Marland Kitchen lisinopril (PRINIVIL,ZESTRIL) 20 MG tablet Take 20 mg by mouth daily.     . metFORMIN (GLUCOPHAGE-XR) 500 MG 24 hr tablet Take 500 mg by mouth at bedtime.     . nitroGLYCERIN (NITROSTAT) 0.4 MG SL tablet Place 0.4 mg under the tongue every 5 (five) minutes as needed for chest pain.    Marland Kitchen omeprazole (PRILOSEC) 20 MG capsule Take 20 mg by mouth at bedtime.     Marland Kitchen oxyCODONE-acetaminophen (PERCOCET) 10-325 MG tablet Take 1 tablet by mouth every 4 (four) hours as needed for pain. 40 tablet 0  . pioglitazone (ACTOS) 15 MG tablet Take 15 mg by mouth at bedtime.       Results for orders placed or performed during the hospital encounter of 06/18/17 (from the past 48 hour(s))  Basic metabolic panel     Status: Abnormal   Collection Time: 06/18/17 12:34 AM  Result Value Ref Range   Sodium 133 (L) 135 - 145 mmol/L   Potassium 4.6 3.5 - 5.1 mmol/L   Chloride 101 101 - 111 mmol/L   CO2 26 22 - 32 mmol/L   Glucose, Bld 216 (H) 65 - 99 mg/dL   BUN 14 6 - 20 mg/dL   Creatinine, Ser 1.07 0.61 - 1.24 mg/dL   Calcium 9.0 8.9 - 10.3 mg/dL   GFR calc non Af Amer >60 >60 mL/min   GFR calc Af Amer >60 >60 mL/min    Comment: (NOTE) The eGFR has been calculated using the CKD EPI equation. This calculation has not been validated in all clinical situations. eGFR's persistently <60 mL/min signify possible Chronic Kidney Disease.    Anion gap 6 5 - 15  CBC      Status: Abnormal   Collection Time: 06/18/17 12:34 AM  Result Value Ref Range   WBC 7.4 3.8 - 10.6 K/uL   RBC 4.50 4.40 - 5.90 MIL/uL   Hemoglobin 11.7 (L) 13.0 - 18.0 g/dL   HCT 34.8 (L) 40.0 - 52.0 %   MCV 77.2 (L) 80.0 - 100.0 fL   MCH 26.0 26.0 - 34.0 pg   MCHC 33.7 32.0 - 36.0 g/dL   RDW 16.4 (H) 11.5 - 14.5 %   Platelets 252 150 - 440  K/uL  Troponin I     Status: Abnormal   Collection Time: 06/18/17 12:34 AM  Result Value Ref Range   Troponin I 1.57 (HH) <0.03 ng/mL    Comment: CRITICAL RESULT CALLED TO, READ BACK BY AND VERIFIED WITH REBECCA UHORCHUK AT 0117 ON 06/18/17 RWW   Hepatic function panel     Status: Abnormal   Collection Time: 06/18/17 12:34 AM  Result Value Ref Range   Total Protein 7.9 6.5 - 8.1 g/dL   Albumin 4.0 3.5 - 5.0 g/dL   AST 34 15 - 41 U/L   ALT 18 17 - 63 U/L   Alkaline Phosphatase 92 38 - 126 U/L   Total Bilirubin 0.9 0.3 - 1.2 mg/dL   Bilirubin, Direct <2.2 (L) 0.1 - 0.5 mg/dL   Indirect Bilirubin NOT CALCULATED 0.3 - 0.9 mg/dL  Magnesium     Status: None   Collection Time: 06/18/17 12:34 AM  Result Value Ref Range   Magnesium 1.7 1.7 - 2.4 mg/dL  CK     Status: None   Collection Time: 06/18/17 12:34 AM  Result Value Ref Range   Total CK 303 49 - 397 U/L  Protime-INR     Status: None   Collection Time: 06/18/17 12:34 AM  Result Value Ref Range   Prothrombin Time 13.9 11.4 - 15.2 seconds   INR 1.07   APTT     Status: Abnormal   Collection Time: 06/18/17 12:34 AM  Result Value Ref Range   aPTT 39 (H) 24 - 36 seconds    Comment:        IF BASELINE aPTT IS ELEVATED, SUGGEST PATIENT RISK ASSESSMENT BE USED TO DETERMINE APPROPRIATE ANTICOAGULANT THERAPY.   MRSA PCR Screening     Status: None   Collection Time: 06/18/17  4:21 AM  Result Value Ref Range   MRSA by PCR NEGATIVE NEGATIVE    Comment:        The GeneXpert MRSA Assay (FDA approved for NASAL specimens only), is one component of a comprehensive MRSA  colonization surveillance program. It is not intended to diagnose MRSA infection nor to guide or monitor treatment for MRSA infections.    Dg Chest 2 View  Result Date: 06/18/2017 CLINICAL DATA:  Chest pain. EXAM: CHEST  2 VIEW COMPARISON:  05/16/2017 FINDINGS: Post median sternotomy and CABG. Unchanged heart size and mediastinal contours, borderline cardiomegaly. Pulmonary vasculature is normal. Streaky bibasilar atelectasis. No confluent consolidation, pleural effusion, or pneumothorax. No acute osseous abnormalities are seen. IMPRESSION: Bibasilar atelectasis. Post CABG with borderline cardiomegaly. Electronically Signed   By: Rubye Oaks M.D.   On: 06/18/2017 01:09    Review of Systems  Constitutional: Negative for chills and fever.  HENT: Negative for sore throat and tinnitus.   Eyes: Negative for blurred vision and redness.  Respiratory: Negative for cough and shortness of breath.   Cardiovascular: Positive for chest pain. Negative for palpitations, orthopnea and PND.  Gastrointestinal: Negative for abdominal pain, diarrhea, nausea and vomiting.  Genitourinary: Negative for dysuria, frequency and urgency.  Musculoskeletal: Negative for joint pain and myalgias.  Skin: Negative for rash.       No lesions  Neurological: Positive for dizziness. Negative for speech change, focal weakness and weakness.  Endo/Heme/Allergies: Does not bruise/bleed easily.       No temperature intolerance  Psychiatric/Behavioral: Negative for depression and suicidal ideas.    Blood pressure 125/69, pulse 86, resp. rate 14, height 5\' 11"  (1.803 m), weight 90.7 kg (200 lb),  SpO2 98 %. Physical Exam  Constitutional: He is oriented to person, place, and time. He appears well-developed and well-nourished. No distress.  HENT:  Head: Normocephalic and atraumatic.  Mouth/Throat: Oropharynx is clear and moist.  Eyes: Conjunctivae and EOM are normal. Pupils are equal, round, and reactive to light. No  scleral icterus.  Neck: Normal range of motion. Neck supple. No JVD present. No tracheal deviation present. No thyromegaly present.  Cardiovascular: Normal rate, regular rhythm and normal heart sounds.  Exam reveals no gallop and no friction rub.   No murmur heard. Respiratory: Effort normal and breath sounds normal. No respiratory distress.  GI: Soft. Bowel sounds are normal. He exhibits no distension. There is no tenderness.  Genitourinary:  Genitourinary Comments: Deferred  Musculoskeletal: Normal range of motion. He exhibits no edema.  Lymphadenopathy:    He has no cervical adenopathy.  Neurological: He is alert and oriented to person, place, and time. No cranial nerve deficit.  Skin: Skin is warm and dry. No rash noted. No erythema.  Psychiatric: He has a normal mood and affect. His behavior is normal. Judgment and thought content normal.     Assessment/Plan This is a 71 year old male admitted for NSTEMI.  1. NSTEMI: Inferior territory ischemia. Continue heparin drip. Labetalol to decrease myocardial oxygen demand. The patient is nothing by mouth in anticipation of cardiac catheterization. 2. CAD: Unstable; continue aspirin 3. Hypertension: Controlled; continue carvedilol, amlodipine and lisinopril. 4. Diabetes mellitus type 2: The patient had been on metformin and Actos. Recent hemoglobin A1c 5.8; hold oral hypoglycemic agents. Sliding for insulin while hospitalized. Gabapentin for neuropathy 5. Hyperlipidemia: Continue statin therapy 6. DVT prophylaxis: Full anticoagulation as above 7. GI prophylaxis: PPI The patient is a full code. Time spent on admission orders and patient care approximately 45 minutes  Harrie Foreman, MD 06/18/2017, 7:14 AM

## 2017-06-18 NOTE — ED Notes (Addendum)
Pt got heparin 4,000 units bolused from bag rather than individual heparin dose.

## 2017-06-18 NOTE — Consult Note (Signed)
[facility] Cardiology  CARDIOLOGY CONSULT NOTE  Patient ID: Kenneth Duarte MRN: [id number] DOB/AGE: 1946-07-17 71 y.o.  Admit date: 06/18/2017 Referring Physician Marcille Blanco Primary Physician [place] Primary Cardiologist Nehemiah Massed Reason for Consultation NSTEMI  HPI: 71 year old gentleman referred for evaluation chest pain. The patient is status post 3 vessel CABG 03/2017 at [facility]. He reports he was in his usual state of health until approximately 2 days ago when he experienced substernal chest discomfort relieved after 2 sublingual nitroglycerin. Yesterday, the patient recurrent chest pain which was unrelieved after suffering a nitroglycerin, and presented to [facility] emergency room. ECG revealed ST depressions in the lateral leads. Admission labs were notable for elevated troponin of 1.57. The patient was placed on nitroglycerin drip with resolution of chest pain.  Review of systems complete and found to be negative unless listed above     Past Medical History:  Diagnosis Date  . Arthritis   . Chronic pain syndrome   . Congestive heart failure ([place]) 1980  . Coronary artery disease   . Diabetes ([place])    DIET  . Dyspnea    with exertion  . ED (erectile dysfunction)   . GERD (gastroesophageal reflux disease)   . Gout   . Headache   . Hyperlipemia   . Hypertension    CONTROLLED ON MEDS  . Neuropathy of both feet   . Seborrheic keratosis   . Sinus congestion   . Wears dentures    PARTIAL UPPER    Past Surgical History:  Procedure Laterality Date  . COLONOSCOPY WITH PROPOFOL N/A 10/25/2015   Procedure: COLONOSCOPY WITH PROPOFOL;  Surgeon: Lucilla Lame, MD;  Location: [place];  Service: Endoscopy;  Laterality: N/A;  DIABETIC-ORAL MEDS  . CORONARY ARTERY BYPASS GRAFT N/A 04/10/2017   Procedure: CORONARY ARTERY BYPASS GRAFTING (CABG) x three , using left internal mammary artery and right leg greater saphenous vein harvested endoscopically;  Surgeon: Ivin Poot, MD;   Location: [place];  Service: Open Heart Surgery;  Laterality: N/A;  . KNEE SURGERY Right    over 20 years ago  . LEFT HEART CATH AND CORONARY ANGIOGRAPHY Left 02/22/2017   Procedure: Left Heart Cath and Coronary Angiography;  Surgeon: Corey Skains, MD;  Location: [place] [facility];  Service: Cardiovascular;  Laterality: Left;  . POLYPECTOMY  10/25/2015   Procedure: POLYPECTOMY;  Surgeon: Lucilla Lame, MD;  Location: [place];  Service: Endoscopy;;  . ROTATOR CUFF REPAIR Right 2012  . TEE WITHOUT CARDIOVERSION N/A 04/10/2017   Procedure: TRANSESOPHAGEAL ECHOCARDIOGRAM (TEE);  Surgeon: Ivin Poot, MD;  Location: [place];  Service: Open Heart Surgery;  Laterality: N/A;    Prescriptions Prior to Admission  Medication Sig Dispense Refill Last Dose  . acetaminophen (TYLENOL) 500 MG tablet Take 2 tablets (1,000 mg total) by mouth every 6 (six) hours as needed. (Patient taking differently: Take 1,000 mg by mouth every 6 (six) hours as needed for mild pain, fever or headache. ) 30 tablet 0 prn at prn  . amLODipine (NORVASC) 10 MG tablet Take 10 mg by mouth daily.   06/17/2017 at Unknown time  . aspirin EC 325 MG EC tablet Take 1 tablet (325 mg total) by mouth daily. 30 tablet 0 06/17/2017 at Unknown time  . atorvastatin (LIPITOR) 80 MG tablet Take 80 mg by mouth at bedtime.    06/17/2017 at Unknown time  . beclomethasone (QVAR) 40 MCG/ACT inhaler Inhale 2 puffs into the lungs 2 (two) times daily.  06/17/2017 at Unknown time  . carvedilol (COREG) 6.25 MG tablet Take 6.25 mg by mouth 2 (two) times daily.   06/17/2017 at Unknown time  . docusate sodium (COLACE) 100 MG capsule Take 100 mg by mouth 2 (two) times daily.   06/17/2017 at Unknown time  . gabapentin (NEURONTIN) 800 MG tablet Take 1,600 mg by mouth 3 (three) times daily.   06/17/2017 at Unknown time  . lisinopril (PRINIVIL,ZESTRIL) 20 MG tablet Take 20 mg by mouth daily.    06/17/2017 at Unknown time  . metFORMIN (GLUCOPHAGE-XR) 500 MG 24 hr  tablet Take 500 mg by mouth at bedtime.    06/17/2017 at Unknown time  . nitroGLYCERIN (NITROSTAT) 0.4 MG SL tablet Place 0.4 mg under the tongue every 5 (five) minutes as needed for chest pain.   06/18/2017 at Unknown time  . omeprazole (PRILOSEC) 20 MG capsule Take 20 mg by mouth at bedtime.    06/17/2017 at Unknown time  . oxyCODONE-acetaminophen (PERCOCET) 10-325 MG tablet Take 1 tablet by mouth every 4 (four) hours as needed for pain. 40 tablet 0 prn at prn  . pioglitazone (ACTOS) 15 MG tablet Take 15 mg by mouth at bedtime.    06/17/2017 at Unknown time   Social History   Social History  . Marital status: Married    Spouse name: N/A  . Number of children: N/A  . Years of education: N/A   Occupational History  . Not on file.   Social History Main Topics  . Smoking status: Former Smoker    Packs/day: 2.00    Years: 15.00    Types: Cigarettes    Quit date: 06/30/1979  . Smokeless tobacco: Never Used  . Alcohol use No     Comment: QUIT IN 1980  . Drug use: No  . Sexual activity: Not on file   Other Topics Concern  . Not on file   Social History Narrative  . No narrative on file    Family History  Problem Relation Age of Onset  . Cancer Father   . Heart disease Father   . Cancer Mother   . Heart disease Brother   . Diabetes Brother   . Prostate cancer Brother   . Diabetes Sister   . Bladder Cancer Neg Hx   . Kidney disease Neg Hx       Review of systems complete and found to be negative unless listed above      PHYSICAL EXAM  General: Well developed, well nourished, in no acute distress HEENT:  Normocephalic and atramatic Neck:  No JVD.  Lungs: Clear bilaterally to auscultation and percussion. Heart: HRRR . Normal S1 and S2 without gallops or murmurs.  Abdomen: Bowel sounds are positive, abdomen soft and non-tender  Msk:  Back normal, normal gait. Normal strength and tone for age. Extremities: No clubbing, cyanosis or edema.   Neuro: Alert and oriented X  3. Psych:  Good affect, responds appropriately  Labs:   Lab Results  Component Value Date   WBC 7.4 06/18/2017   HGB 11.7 (L) 06/18/2017   HCT 34.8 (L) 06/18/2017   MCV 77.2 (L) 06/18/2017   PLT 252 06/18/2017    Recent Labs Lab 06/18/17 0034  NA 133*  K 4.6  CL 101  CO2 26  BUN 14  CREATININE 1.07  CALCIUM 9.0  PROT 7.9  BILITOT 0.9  ALKPHOS 92  ALT 18  AST 34  GLUCOSE 216*   Lab Results  Component Value Date  CKTOTAL 303 06/18/2017   TROPONINI 1.57 (HH) 06/18/2017   No results found for: CHOL No results found for: HDL No results found for: LDLCALC No results found for: TRIG No results found for: CHOLHDL No results found for: LDLDIRECT    Radiology: Dg Chest 2 View  Result Date: 06/18/2017 CLINICAL DATA:  Chest pain. EXAM: CHEST  2 VIEW COMPARISON:  05/16/2017 FINDINGS: Post median sternotomy and CABG. Unchanged heart size and mediastinal contours, borderline cardiomegaly. Pulmonary vasculature is normal. Streaky bibasilar atelectasis. No confluent consolidation, pleural effusion, or pneumothorax. No acute osseous abnormalities are seen. IMPRESSION: Bibasilar atelectasis. Post CABG with borderline cardiomegaly. Electronically Signed   By: Jeb Levering M.D.   On: 06/18/2017 01:09    EKG: Normal sinus rhythm, ST depression noted in lateral leads  ASSESSMENT AND PLAN:   1. Probable non-STEMI, in patient status post recent CABG, who presents with chest pain, relieved with nitroglycerin drip, with ischemic ECG changes, and elevated troponin  Recommendations  1. Continue current medications 2. Continue heparin drip 3. Proceed with cardiac catheterization with selective coronary arteriography. The risks, benefits and alternatives to cardiac catheterization and possible PCI were explained to the patient and informed consent was obtained  Signed: Isaias Cowman MD,PhD, [facility] 06/18/2017, 8:57 AM

## 2017-06-18 NOTE — ED Provider Notes (Signed)
[facility] Emergency Department Provider Note  ____________________________________________   First MD Initiated Contact with Patient 06/18/17 [phone]     (approximate)  I have reviewed the triage vital signs and the nursing notes.   HISTORY  Chief Complaint Chest Pain    HPI Kenneth Duarte is a 71 y.o. male with extensive coronary artery disease status post 3 vessel CABG just over 2 months ago at [facility].  His primary cardiologist is Dr. Nehemiah Massed at [facility].  He presents by private vehicle with his wife tonight for evaluation of acute onset severe chest pressure similar to prior angina he had before his CABG.  He reports that since having his surgery at the end of April he has not had chest pain regularly.  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  He did not drink extra fluids although he states he "drank a Coke".  He states that he had acute onset of central and left-sided chest pressure and tightness at about 11 AM yesterday.  He took nitroglycerin and the pain improved and then took another nitroglycerin and it went away and was gone for the rest of the day.  However, he reports that this afternoon the pain came back and was stronger than before.  He describes it as severe.  He took 4 nitroglycerin over the course of about 4 hours and each time the pain improved "about 90%" but then came back as strong as before.  Just prior to arrival when his wife brought him and he was short of breath with the severity of the pain.  He had a full dose aspirin but it was yesterday morning at 6 AM, about 19 hours ago.  After getting settled in the emergency department he states that the pain has improved a lot but is still present.  His blood pressure is elevated in the systolic 096G and he says that since the surgery it is generally in the 120s.  The patient denies any recent infectious  symptoms.  He has been doing well since the surgery.  He states the nitroglycerin made the pain better and nothing in particular made it worse although it did seem worse when he lies flat which is consistent with the angina he experienced prior to the surgery.  He denies nausea, vomiting, abdominal pain, lightheadedness/dizziness.   Past Medical History:  Diagnosis Date  . Arthritis   . Chronic pain syndrome   . Congestive heart failure ([place]) 1980  . Coronary artery disease   . Diabetes ([place])    DIET  . Dyspnea    with exertion  . ED (erectile dysfunction)   . GERD (gastroesophageal reflux disease)   . Gout   . Headache   . Hyperlipemia   . Hypertension    CONTROLLED ON MEDS  . Neuropathy of both feet   . Seborrheic keratosis   . Sinus congestion   . Wears dentures    PARTIAL UPPER    Patient Active Problem List   Diagnosis Date Noted  . 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 Stable angina ([place]) 02/14/2017  . Personal history of colonic polyps   . Benign neoplasm of sigmoid colon   . Benign essential HTN 06/02/2015  . Breathlessness on exertion 02/02/2015  . Carotid artery plaque 10/26/2014  . Arteriosclerosis of coronary artery 10/16/2014  . Diabetes ([place]) 10/16/2014  . Combined fat and carbohydrate induced  hyperlipemia 10/16/2014  . Diabetes mellitus ([place]) 10/16/2014    Past Surgical History:  Procedure Laterality Date  . COLONOSCOPY WITH PROPOFOL N/A 10/25/2015   Procedure: COLONOSCOPY WITH PROPOFOL;  Surgeon: Lucilla Lame, MD;  Location: [place];  Service: Endoscopy;  Laterality: N/A;  DIABETIC-ORAL MEDS  . CORONARY ARTERY BYPASS GRAFT N/A 04/10/2017   Procedure: CORONARY ARTERY BYPASS GRAFTING (CABG) x three , using left internal mammary artery and right leg greater saphenous vein harvested endoscopically;  Surgeon: Ivin Poot, MD;  Location: [place];  Service: Open Heart Surgery;  Laterality: N/A;  . KNEE SURGERY Right    over 20 years ago  . LEFT HEART CATH  AND CORONARY ANGIOGRAPHY Left 02/22/2017   Procedure: Left Heart Cath and Coronary Angiography;  Surgeon: Corey Skains, MD;  Location: [place] [facility];  Service: Cardiovascular;  Laterality: Left;  . POLYPECTOMY  10/25/2015   Procedure: POLYPECTOMY;  Surgeon: Lucilla Lame, MD;  Location: [place];  Service: Endoscopy;;  . ROTATOR CUFF REPAIR Right 2012  . TEE WITHOUT CARDIOVERSION N/A 04/10/2017   Procedure: TRANSESOPHAGEAL ECHOCARDIOGRAM (TEE);  Surgeon: Ivin Poot, MD;  Location: [place];  Service: Open Heart Surgery;  Laterality: N/A;    Prior to Admission medications   Medication Sig Start Date End Date Taking? Authorizing Provider  acetaminophen (TYLENOL) 500 MG tablet Take 2 tablets (1,000 mg total) by mouth every 6 (six) hours as needed. 04/15/17   Barrett, Erin R, PA-C  alprostadil (EDEX) 20 MCG injection 20 mcg by Intracavitary route as needed for erectile dysfunction. use no more than 3 times per week 07/02/15   Hollice Espy, MD  amLODipine (NORVASC) 10 MG tablet Take 10 mg by mouth daily. 05/29/17   [provider]  aspirin EC 325 MG EC tablet Take 1 tablet (325 mg total) by mouth daily. 04/15/17   Barrett, Erin R, PA-C  atorvastatin (LIPITOR) 80 MG tablet Take 80 mg by mouth at bedtime.     [provider]  beclomethasone (QVAR) 40 MCG/ACT inhaler Inhale 1 puff into the lungs 2 (two) times daily.     [provider]  carvedilol (COREG) 3.125 MG tablet Take 3.125 mg by mouth 2 (two) times daily. 05/21/17   [provider]  fluticasone (FLONASE) 50 MCG/ACT nasal spray Place 2 sprays into both nostrils 2 (two) times daily. 02/27/17   [provider]  gabapentin (NEURONTIN) 800 MG tablet Take 1,600 mg by mouth 3 (three) times daily.    [provider]  guaiFENesin (MUCINEX) 600 MG 12 hr tablet Take 600 mg by mouth 2 (two) times daily.    [provider]  lisinopril (PRINIVIL,ZESTRIL) 20 MG tablet Take 20 mg by  mouth daily.     [provider]  metFORMIN (GLUCOPHAGE-XR) 500 MG 24 hr tablet Take 500 mg by mouth at bedtime.  05/31/15   [provider]  omeprazole (PRILOSEC) 20 MG capsule Take 20 mg by mouth at bedtime.     [provider]  oxyCODONE-acetaminophen (PERCOCET) 10-325 MG tablet Take 1 tablet by mouth every 4 (four) hours as needed for pain. 04/15/17   Barrett, Erin R, PA-C  pioglitazone (ACTOS) 15 MG tablet Take 15 mg by mouth at bedtime.     [provider]    Allergies Cortisone acetate [cortisone]  Family History  Problem Relation Age of Onset  . Cancer Father   . Heart disease Father   . Cancer Mother   . Heart  disease Brother   . Diabetes Brother   . Prostate cancer Brother   . Diabetes Sister   . Bladder Cancer Neg Hx   . Kidney disease Neg Hx     Social History Social History  Substance Use Topics  . Smoking status: Former Smoker    Packs/day: 2.00    Years: 15.00    Types: Cigarettes    Quit date: 06/30/1979  . Smokeless tobacco: Never Used  . Alcohol use No     Comment: QUIT IN 1980    Review of Systems Constitutional: No fever/chills Eyes: No visual changes. ENT: No sore throat. Cardiovascular: Severe but now improved chest pain. Respiratory: shortness of breath when the chest pain was most severe prior to arrival Gastrointestinal: No abdominal pain.  No nausea, no vomiting.  No diarrhea.  No constipation. Genitourinary: Negative for dysuria. Musculoskeletal: Negative for neck pain.  Negative for back pain. Integumentary: Negative for rash. Neurological: Negative for headaches, focal weakness or numbness.   ____________________________________________   PHYSICAL EXAM:  VITAL SIGNS: ED Triage Vitals  Enc Vitals Group     BP 06/18/17 0028 (!) 166/93     Pulse Rate 06/18/17 0028 98     Resp 06/18/17 0028 14     Temp --      Temp src --      SpO2 06/18/17 0028 99 %     Weight 06/18/17 0029 90.7 kg (200 lb)      Height 06/18/17 0029 1.803 m (5\' 11" )     Head Circumference --      Peak Flow --      Pain Score 06/18/17 0028 4     Pain Loc --      Pain Edu? --      Excl. in [place]? --     Constitutional: Alert and oriented. Well appearing and in no acute distress. Eyes: Conjunctivae are normal.  Head: Atraumatic. Nose: No congestion/rhinnorhea. Mouth/Throat: Mucous membranes are moist. Neck: No stridor.  No meningeal signs.   Cardiovascular: Well appearing scar over the sternum consistent with history of recent CABG.  Normal rate, regular rhythm. Good peripheral circulation. Grossly normal heart sounds with no evidence of murmur.   Respiratory: Normal respiratory effort.  No retractions. Lungs CTAB. Gastrointestinal: Soft and nontender. No distention.  Musculoskeletal: No lower extremity tenderness nor edema. No gross deformities of extremities. Neurologic:  Normal speech and language. No gross focal neurologic deficits are appreciated.  Skin:  Skin is warm, dry and intact. No rash noted. Psychiatric: Mood and affect are normal. Speech and behavior are normal.  ____________________________________________   LABS (all labs ordered are listed, but only abnormal results are displayed)  Labs Reviewed  BASIC METABOLIC PANEL - Abnormal; Notable for the following:       Result Value   Sodium 133 (*)    Glucose, Bld 216 (*)    All other components within normal limits  CBC - Abnormal; Notable for the following:    Hemoglobin 11.7 (*)    HCT 34.8 (*)    MCV 77.2 (*)    RDW 16.4 (*)    All other components within normal limits  TROPONIN I - Abnormal; Notable for the following:    Troponin I 1.57 (*)    All other components within normal limits  HEPATIC FUNCTION PANEL - Abnormal; Notable for the following:    Bilirubin, Direct <0.1 (*)    All other components within normal limits  APTT - Abnormal; Notable for the  following:    aPTT 39 (*)    All other components within normal limits  MAGNESIUM   CK  PROTIME-INR   ____________________________________________  EKG  ED ECG REPORT I, Dalores Weger, the attending physician, personally viewed and interpreted this ECG.  Date: 06/18/2017 EKG Time: 00:18 Rate: 84 Rhythm: normal sinus rhythm QRS Axis: normal Intervals: Incomplete right bundle branch block ST/T Wave abnormalities: Marked ST depression in lateral leads and inferior leads (and lead 1) with some ST elevation in lead aVR and lead V1. Narrative Interpretation: concerning for acute ischemia.  changed from last ECG from 04/11/2017 (post-op).  ____________________________________________  RADIOLOGY   Dg Chest 2 View  Result Date: 06/18/2017 CLINICAL DATA:  Chest pain. EXAM: CHEST  2 VIEW COMPARISON:  05/16/2017 FINDINGS: Post median sternotomy and CABG. Unchanged heart size and mediastinal contours, borderline cardiomegaly. Pulmonary vasculature is normal. Streaky bibasilar atelectasis. No confluent consolidation, pleural effusion, or pneumothorax. No acute osseous abnormalities are seen. IMPRESSION: Bibasilar atelectasis. Post CABG with borderline cardiomegaly. Electronically Signed   By: Jeb Levering M.D.   On: 06/18/2017 01:09    ____________________________________________   PROCEDURES  Critical Care performed: Yes, see critical care procedure note(s)   Procedure(s) performed:   .Critical Care Performed by: Hinda Kehr Authorized by: Hinda Kehr   Critical care provider statement:    Critical care time (minutes):  60   Critical care time was exclusive of:  Separately billable procedures and treating other patients   Critical care was necessary to treat or prevent imminent or life-threatening deterioration of the following conditions:  Cardiac failure and circulatory failure   Critical care was time spent personally by me on the following activities:  Development of treatment plan with patient or surrogate, discussions with consultants, evaluation of  patient's response to treatment, examination of patient, obtaining history from patient or surrogate, ordering and performing treatments and interventions, ordering and review of laboratory studies, ordering and review of radiographic studies, pulse oximetry, re-evaluation of patient's condition and review of old charts      ____________________________________________   INITIAL IMPRESSION / [place] / ED COURSE  Pertinent labs & imaging results that were available during my care of the patient were reviewed by me and considered in my medical decision making (see chart for details).    Clinical Course as of Jun 18 144  Mon Jun 18, 2017  0057 I saw the patient as soon as possible after he was placed in the room given the new EKG changes identified from his triage EKG compared to 04/11/2017.  His chest pain has improved from prior but he has changes concerning for acute ischemia.  He does not meet STEMI criteria but I am concerned about the elevation in lead aVR as well as the significant depression in the inferior and lateral leads as well as lead 1.  I called and spoke by phone with Dr. Fletcher Anon who is on-call for the [facility] catheter lab.  I sent him the EKGs to review and he is going to let me know if he feels the patient needs to be taken urgently to the catheter lab.  Labs are pending at this time.  My current plan for medical treatment is nitroglycerin 1 inch paste to anterior chest wall, IV fluids given that his symptoms started after he was outside in the sun yesterday, full set of labs and monitoring, and a full dose aspirin as it has been nearly 24 hours since he had his last  aspirin.  We will monitor carefully and I suspect I will start heparin for unstable angina versus NSTEMI (depending on troponin result) but I will also await recommendations from Dr. Fletcher Anon.  [CF]  0104 Dr. Fletcher Anon contacted me, agrees ECG is concerning, recommends initiating heparin  therapy and plans for cath in AM.  If symptoms worsen or are refractory to treatment, will consider urgent cath tonight.  [CF]  0117 Dr. Fletcher Anon suggested I page on-call [facility] cardiology to let them know to plan for an early cath.  Dr. Clayborn Bigness is reportedly on call.  We have paged and are awaiting a response.  [CF]  0118 Lab called and the troponin is 1.57.  Patient still feels better than he did prior to arrival in the emergency department.  I updated Dr. Fletcher Anon by text and am awaiting a callback from Dr. Clayborn Bigness.  [CF]  0128 Patient's pain increased a little bit but it seems to be mostly positional, after we sat him up it improved somewhat.  His blood pressure still elevated.  I am going to start him on a nitroglycerin drip and titrate to pain free if possible since nitroglycerin typically is successful in improving the chest pain.  [CF]  0144 I spoke with him with Dr. Clayborn Bigness.  He recommended giving metoprolol (either IV or 5 mouth) and Plavix 300 mg by mouth.  He or one of his colleagues will see the patient in the morning to determine the ongoing need for catheterization.  I updated the hospitalist, Dr. Marcille Blanco, as well.  [CF]    Clinical Course User Index [CF] Hinda Kehr, MD    ____________________________________________  FINAL CLINICAL IMPRESSION(S) / ED DIAGNOSES  Final diagnoses:  NSTEMI (non-ST elevated myocardial infarction) [facility])     MEDICATIONS GIVEN DURING THIS VISIT:  Medications  heparin injection 4,000 Units (not administered)  heparin ADULT infusion 100 units/mL (25000 units/253mL sodium chloride 0.45%) (1,200 Units/hr Intravenous New Bag/Given 06/18/17 0138)  clopidogrel (PLAVIX) tablet 300 mg (not administered)  metoprolol tartrate (LOPRESSOR) tablet 25 mg (not administered)  nitroGLYCERIN (NITROGLYN) 2 % ointment 1 inch (1 inch Topical Given 06/18/17 0050)  aspirin chewable tablet 324 mg (324 mg Oral Given 06/18/17 0049)  sodium chloride 0.9 % bolus 1,000 mL (1,000 mLs  Intravenous New Bag/Given 06/18/17 0059)  heparin bolus via infusion 4,000 Units (4,000 Units Intravenous Bolus from Bag 06/18/17 0139)  nitroGLYCERIN 50 mg in dextrose 5 % 250 mL (0.2 mg/mL) infusion (5 mcg/min Intravenous New Bag/Given 06/18/17 0133)     NEW OUTPATIENT MEDICATIONS STARTED DURING THIS VISIT:  New Prescriptions   No medications on file    Modified Medications   No medications on file    Discontinued Medications   CARVEDILOL (COREG) 6.25 MG TABLET    Take 1 tablet (6.25 mg total) by mouth 2 (two) times daily with a meal.     Note:  This document was prepared using Dragon voice recognition software and may include unintentional dictation errors.    Hinda Kehr, MD 06/18/17 [phone]

## 2017-06-19 ENCOUNTER — Encounter (HOSPITAL_COMMUNITY)
Admission: AD | Disposition: A | Discharge status: Home / Self Care | Payer: Self-pay | Source: Other Acute Inpatient Hospital | Attending: Cardiovascular Disease

## 2017-06-19 ENCOUNTER — Ambulatory Visit (HOSPITAL_COMMUNITY): Admit: 2017-06-19 | Payer: Medicare PPO | Admitting: Interventional Cardiology

## 2017-06-19 ENCOUNTER — Encounter (HOSPITAL_COMMUNITY): Payer: Self-pay

## 2017-06-19 DIAGNOSIS — I25810 Atherosclerosis of coronary artery bypass graft(s) without angina pectoris: Secondary | ICD-10-CM

## 2017-06-19 HISTORY — PX: CORONARY STENT INTERVENTION: CATH118234

## 2017-06-19 LAB — TROPONIN I
Troponin I: 5.08 ng/mL (ref <0.03)
Troponin I: 5.67 ng/mL (ref <0.03)

## 2017-06-19 LAB — POCT ACTIVATED CLOTTING TIME
Activated Clotting Time: 290 seconds
Activated Clotting Time: 268 seconds

## 2017-06-19 LAB — BASIC METABOLIC PANEL
Anion gap: 8 (ref 5–15)
BUN: 9 mg/dL (ref 6–20)
CO2: 24 mmol/L (ref 22–32)
Calcium: 8.7 mg/dL — ABNORMAL LOW (ref 8.9–10.3)
Chloride: 107 mmol/L (ref 101–111)
Creatinine, Ser: 0.94 mg/dL (ref 0.61–1.24)
GFR calc Af Amer: >60 mL/min (ref >60)
GFR calc non Af Amer: >60 mL/min (ref >60)
Glucose, Bld: 140 mg/dL — ABNORMAL HIGH (ref 65–99)
Potassium: 4.4 mmol/L (ref 3.5–5.1)
Sodium: 139 mmol/L (ref 135–145)

## 2017-06-19 LAB — CBC
HCT: 32.2 % — ABNORMAL LOW (ref 39.0–52.0)
Hemoglobin: 9.8 g/dL — ABNORMAL LOW (ref 13.0–17.0)
MCH: 24.8 pg — ABNORMAL LOW (ref 26.0–34.0)
MCHC: 30.4 g/dL (ref 30.0–36.0)
MCV: 81.5 fL (ref 78.0–100.0)
Platelets: 182 10*3/uL (ref 150–400)
RBC: 3.95 MIL/uL — ABNORMAL LOW (ref 4.22–5.81)
RDW: 15.2 % (ref 11.5–15.5)
WBC: 5.9 10*3/uL (ref 4.0–10.5)

## 2017-06-19 LAB — LIPID PANEL
Cholesterol: 91 mg/dL (ref 0–200)
HDL: 22 mg/dL — ABNORMAL LOW (ref >40)
LDL Cholesterol: 29 mg/dL (ref 0–99)
Total CHOL/HDL Ratio: 4.1 RATIO
Triglycerides: 198 mg/dL — ABNORMAL HIGH (ref <150)
VLDL: 40 mg/dL (ref 0–40)

## 2017-06-19 LAB — PROTIME-INR
INR: 1.17
Prothrombin Time: 15 seconds (ref 11.4–15.2)

## 2017-06-19 LAB — HEPARIN LEVEL (UNFRACTIONATED)
Heparin Unfractionated: 0.36 IU/mL (ref 0.30–0.70)
Heparin Unfractionated: 0.54 IU/mL (ref 0.30–0.70)

## 2017-06-19 LAB — GLUCOSE, CAPILLARY
Glucose-Capillary: 144 mg/dL — ABNORMAL HIGH (ref 65–99)
Glucose-Capillary: 218 mg/dL — ABNORMAL HIGH (ref 65–99)

## 2017-06-19 SURGERY — CORONARY STENT INTERVENTION
Anesthesia: LOCAL

## 2017-06-19 MED ORDER — SODIUM CHLORIDE 0.9 % IV SOLN: INTRAVENOUS | Status: AC

## 2017-06-19 MED ORDER — ACTIVE PARTNERSHIP FOR HEALTH OF YOUR HEART BOOK
Freq: Once | Status: AC
Start: 2017-06-19 — End: 2017-06-19
  Administered 2017-06-19: 21:00:00
  Filled 2017-06-19: qty 1

## 2017-06-19 MED ORDER — VERAPAMIL HCL 2.5 MG/ML IV SOLN
INTRAVENOUS | Status: AC
  Filled 2017-06-19: qty 2

## 2017-06-19 MED ORDER — MIDAZOLAM HCL 2 MG/2ML IJ SOLN
INTRAMUSCULAR | Status: AC
  Filled 2017-06-19: qty 2

## 2017-06-19 MED ORDER — ANGIOPLASTY BOOK
Freq: Once | Status: AC
  Administered 2017-06-19: 20:00:00
  Filled 2017-06-19: qty 1

## 2017-06-19 MED ORDER — MIDAZOLAM HCL 2 MG/2ML IJ SOLN
INTRAMUSCULAR | Status: DC | PRN
  Administered 2017-06-19: 2 mg via INTRAVENOUS

## 2017-06-19 MED ORDER — HEPARIN SODIUM (PORCINE) 1000 UNIT/ML IJ SOLN
INTRAMUSCULAR | Status: DC | PRN
  Administered 2017-06-19: 8000 [IU] via INTRAVENOUS
  Administered 2017-06-19: 1000 [IU] via INTRAVENOUS

## 2017-06-19 MED ORDER — CLOPIDOGREL BISULFATE 300 MG PO TABS
600.0000 mg | ORAL_TABLET | Freq: Once | ORAL | Status: AC
  Administered 2017-06-19: 600 mg via ORAL
  Filled 2017-06-19: qty 2

## 2017-06-19 MED ORDER — ADENOSINE 6 MG/2ML IV SOLN
INTRAVENOUS | Status: AC
  Filled 2017-06-19: qty 2

## 2017-06-19 MED ORDER — FENTANYL CITRATE (PF) 100 MCG/2ML IJ SOLN
INTRAMUSCULAR | Status: AC
  Filled 2017-06-19: qty 2

## 2017-06-19 MED ORDER — ASPIRIN EC 81 MG PO TBEC: 81.0000 mg | DELAYED_RELEASE_TABLET | Freq: Every day | ORAL | Status: DC

## 2017-06-19 MED ORDER — SODIUM CHLORIDE 0.9% FLUSH: 3.0000 mL | INTRAVENOUS | Status: DC | PRN

## 2017-06-19 MED ORDER — VERAPAMIL HCL 2.5 MG/ML IV SOLN
INTRAVENOUS | Status: DC | PRN
  Administered 2017-06-19: 14:00:00 via INTRA_ARTERIAL

## 2017-06-19 MED ORDER — IOPAMIDOL (ISOVUE-370) INJECTION 76%
INTRAVENOUS | Status: DC | PRN
  Administered 2017-06-19: 25 mL via INTRAVENOUS

## 2017-06-19 MED ORDER — OXYCODONE-ACETAMINOPHEN 5-325 MG PO TABS
ORAL_TABLET | ORAL | Status: AC
  Filled 2017-06-19: qty 1

## 2017-06-19 MED ORDER — SODIUM CHLORIDE 0.9 % IV SOLN: 250.0000 mL | INTRAVENOUS | Status: DC | PRN

## 2017-06-19 MED ORDER — ONDANSETRON HCL 4 MG/2ML IJ SOLN: 4.0000 mg | Freq: Four times a day (QID) | INTRAMUSCULAR | Status: DC | PRN

## 2017-06-19 MED ORDER — IOPAMIDOL (ISOVUE-370) INJECTION 76%
INTRAVENOUS | Status: AC
  Filled 2017-06-19: qty 125

## 2017-06-19 MED ORDER — LABETALOL HCL 5 MG/ML IV SOLN
10.0000 mg | INTRAVENOUS | Status: AC | PRN
Start: 2017-06-19 — End: 2017-06-19

## 2017-06-19 MED ORDER — ACETAMINOPHEN 325 MG PO TABS: 650.0000 mg | ORAL_TABLET | ORAL | Status: DC | PRN

## 2017-06-19 MED ORDER — CLOPIDOGREL BISULFATE 75 MG PO TABS
75.0000 mg | ORAL_TABLET | Freq: Every day | ORAL | Status: DC
  Administered 2017-06-20: 75 mg via ORAL
  Filled 2017-06-19: qty 1

## 2017-06-19 MED ORDER — LIDOCAINE HCL 1 % IJ SOLN
INTRAMUSCULAR | Status: AC
  Filled 2017-06-19: qty 20

## 2017-06-19 MED ORDER — THE SENSUOUS HEART BOOK
Freq: Once | Status: AC
  Administered 2017-06-19: 21:00:00
  Filled 2017-06-19: qty 1

## 2017-06-19 MED ORDER — HEPARIN (PORCINE) IN NACL 2-0.9 UNIT/ML-% IJ SOLN
INTRAMUSCULAR | Status: AC
  Filled 2017-06-19: qty 1000

## 2017-06-19 MED ORDER — HEPARIN (PORCINE) IN NACL 2-0.9 UNIT/ML-% IJ SOLN
INTRAMUSCULAR | Status: AC | PRN
  Administered 2017-06-19: 1000 mL

## 2017-06-19 MED ORDER — FENTANYL CITRATE (PF) 100 MCG/2ML IJ SOLN
INTRAMUSCULAR | Status: DC | PRN
  Administered 2017-06-19: 25 ug via INTRAVENOUS

## 2017-06-19 MED ORDER — OXYCODONE HCL 5 MG PO TABS
ORAL_TABLET | ORAL | Status: AC
  Filled 2017-06-19: qty 1

## 2017-06-19 MED ORDER — ADENOSINE (DIAGNOSTIC) FOR INTRACORONARY USE
INTRAVENOUS | Status: DC | PRN
  Administered 2017-06-19: 60 ug via INTRACORONARY

## 2017-06-19 MED ORDER — HYDRALAZINE HCL 20 MG/ML IJ SOLN: 5.0000 mg | INTRAMUSCULAR | Status: AC | PRN

## 2017-06-19 MED ORDER — HEART ATTACK BOUNCING BOOK
Freq: Once | Status: DC
  Filled 2017-06-19: qty 1

## 2017-06-19 MED ORDER — SODIUM CHLORIDE 0.9% FLUSH
3.0000 mL | Freq: Two times a day (BID) | INTRAVENOUS | Status: DC
  Administered 2017-06-19: 3 mL via INTRAVENOUS

## 2017-06-19 MED ORDER — LISINOPRIL 10 MG PO TABS
10.0000 mg | ORAL_TABLET | Freq: Every day | ORAL | Status: DC
  Administered 2017-06-19 – 2017-06-20 (×2): 10 mg via ORAL
  Filled 2017-06-19 (×2): qty 1

## 2017-06-19 MED ORDER — HEPARIN SODIUM (PORCINE) 1000 UNIT/ML IJ SOLN
INTRAMUSCULAR | Status: AC
  Filled 2017-06-19: qty 1

## 2017-06-19 MED ORDER — ASPIRIN 81 MG PO CHEW
81.0000 mg | CHEWABLE_TABLET | Freq: Every day | ORAL | Status: DC
  Administered 2017-06-20: 81 mg via ORAL
  Filled 2017-06-19: qty 1

## 2017-06-19 MED ORDER — CLOPIDOGREL BISULFATE 75 MG PO TABS: 75.0000 mg | ORAL_TABLET | Freq: Every day | ORAL | Status: DC

## 2017-06-19 MED ORDER — LIDOCAINE HCL (PF) 1 % IJ SOLN
INTRAMUSCULAR | Status: DC | PRN
  Administered 2017-06-19: 3 mL

## 2017-06-19 SURGICAL SUPPLY — 16 items
BALLN SAPPHIRE 2.0X20 (BALLOONS) ×2
BALLOON SAPPHIRE 2.0X20 (BALLOONS) ×1 IMPLANT
CATH LAUNCHER 6FR AL1 (CATHETERS) ×1 IMPLANT
CATHETER LAUNCHER 6FR AL1 (CATHETERS) ×2
DEVICE RAD COMP TR BAND LRG (VASCULAR PRODUCTS) ×2 IMPLANT
GLIDESHEATH SLEND SS 6F .021 (SHEATH) ×2 IMPLANT
GUIDEWIRE INQWIRE 1.5J.035X260 (WIRE) ×1 IMPLANT
INQWIRE 1.5J .035X260CM (WIRE) ×2
KIT ENCORE 26 ADVANTAGE (KITS) ×4 IMPLANT
KIT HEART LEFT (KITS) ×2 IMPLANT
PACK CARDIAC CATHETERIZATION (CUSTOM PROCEDURE TRAY) ×2 IMPLANT
STENT PROMUS PREM MR 4.0X38 (Permanent Stent) ×2 IMPLANT
TRANSDUCER W/STOPCOCK (MISCELLANEOUS) ×2 IMPLANT
TUBING CIL FLEX 10 FLL-RA (TUBING) ×2 IMPLANT
VALVE GUARDIAN II ~~LOC~~ HEMO (MISCELLANEOUS) ×2 IMPLANT
WIRE ASAHI PROWATER 180CM (WIRE) ×2 IMPLANT

## 2017-06-19 NOTE — Progress Notes (Signed)
TR BAND REMOVAL  LOCATION:    left radial  DEFLATED PER PROTOCOL:    Yes.    TIME BAND OFF / DRESSING APPLIED:    1800   SITE UPON ARRIVAL:    Level 0  SITE AFTER BAND REMOVAL:    Level 0  CIRCULATION SENSATION AND MOVEMENT:    Within Normal Limits   Yes.    COMMENTS:   Patient tolerated deflation and removal well. Post removal instructions provided. No bleeding noted and left radial site remains soft to touch. Dressing applied C/D/I.

## 2017-06-19 NOTE — Interval H&P Note (Signed)
Cath Lab Visit (complete for each Cath Lab visit)  Clinical Evaluation Leading to the Procedure:   ACS: Yes.    Non-ACS:    Anginal Classification: CCS IV  Anti-ischemic medical therapy: Minimal Therapy (1 class of medications)  Non-Invasive Test Results: No non-invasive testing performed  Prior CABG: Previous CABG  Planned PCI of SVG to diagonal.    History and Physical Interval Note:  06/19/2017 12:49 PM  Kenneth Duarte  has presented today for surgery, with the diagnosis of cad  The various methods of treatment have been discussed with the patient and family. After consideration of risks, benefits and other options for treatment, the patient has consented to  Procedure(s): Coronary Stent Intervention (N/A) as a surgical intervention .  The patient's history has been reviewed, patient examined, no change in status, stable for surgery.  I have reviewed the patient's chart and labs.  Questions were answered to the patient's satisfaction.     Larae Grooms

## 2017-06-19 NOTE — Care Management Note (Signed)
Case Management Note Marvetta Gibbons RN, BSN Unit 2W-Case Manager-- [place] coverage [phone]  Patient Details  Name: Kenneth Duarte MRN: [id number] Date of Birth: 11-17-1946  Subjective/Objective:  Pt admitted with CAD/NSTEMI: He is s/p CABG in April. Admitted to [facility] with a NSTEMI -early graft failure- tx to [facility] for further tx- plan for PCI today              Action/Plan: PTA pt lived at home with family/spouse- CM to follow for d/c needs   Expected Discharge Date:  06/20/17               Expected Discharge Plan:  Home/Self Care  In-House Referral:     Discharge planning Services  CM Consult  Post Acute Care Choice:    Choice offered to:     DME Arranged:    DME Agency:     HH Arranged:    HH Agency:     Status of Service:  In process, will continue to follow  If discussed at Long Length of Stay Meetings, dates discussed:    Discharge Disposition:   Additional Comments:  Dawayne Patricia, RN 06/19/2017, 11:34 AM

## 2017-06-19 NOTE — Progress Notes (Signed)
ANTICOAGULATION CONSULT NOTE - Follow Up Consult  Pharmacy Consult for Heparin Indication: ACS/NSTEMI  Allergies  Allergen Reactions  . Blood-Group Specific Substance Other (See Comments)    Patient received almost 2 units FFP during a procedure. Became hypotensive and developed rash  . Cortisone Acetate [Cortisone] Rash    Patient Measurements: Height: 6\' 1"  (185.4 cm) Weight: 194 lb 7.1 oz (88.2 kg) IBW/kg (Calculated) : 79.9 Heparin Dosing Weight: 88 kg  Vital Signs: Temp: 97.8 F (36.6 C) (07/03 0715) Temp Source: Oral (07/03 0715) BP: 102/55 (07/03 0715) Pulse Rate: 68 (07/03 0715)  Labs:  Recent Labs  06/18/17 0034 06/18/17 1007 06/18/17 1747 06/18/17 2337 06/19/17 0526  HGB 11.7*  --   --   --  9.8*  HCT 34.8*  --   --   --  32.2*  PLT 252  --   --   --  182  APTT 39*  --   --   --   --   LABPROT 13.9  --  15.4*  --  15.0  INR 1.07  --  1.21  --  1.17  HEPARINUNFRC  --  0.45  --  0.36 0.54  CREATININE 1.07  --   --   --  0.94  CKTOTAL 303  --   --   --   --   TROPONINI 1.57* 6.41* 5.95* 5.08* 5.67*    Estimated Creatinine Clearance: 85 mL/min (by C-G formula based on SCr of 0.94 mg/dL).  Assessment: 71 yr old male transferred from [facility] to [facility] s/p cardiac cath today. For PCI on 06/19/17.   Arrived with Heparin drip running at 1200 units/hr.  Heparin level therapeutic.  No overt bleeding or complications noted.   Goal of Therapy:  Heparin level 0.3-0.7 units/ml Monitor platelets by anticoagulation protocol: Yes   Plan:  1. Continue heparin drip at 1200 units/hr. 2. Daily heparin level and CBC while on heparin. 3. F/u plans for heparin after cath lab today.  Uvaldo Rising, BCPS  Clinical Pharmacist Pager [phone]  06/19/2017 7:58 AM

## 2017-06-19 NOTE — Progress Notes (Signed)
ANTICOAGULATION CONSULT NOTE - Follow Up Consult  Pharmacy Consult for Heparin Indication: ACS/NSTEMI  Allergies  Allergen Reactions  . Blood-Group Specific Substance Other (See Comments)    Patient received almost 2 units FFP during a procedure. Became hypotensive and developed rash  . Cortisone Acetate [Cortisone] Rash    Patient Measurements: Height: 6\' 1"  (185.4 cm) Weight: 193 lb 2 oz (87.6 kg) IBW/kg (Calculated) : 79.9 Heparin Dosing Weight: 88 kg  Vital Signs: Temp: 97.8 F (36.6 C) (07/03 0017) Temp Source: Oral (07/03 0017) BP: 101/61 (07/02 2230) Pulse Rate: 68 (07/02 2230)  Labs:  Recent Labs  06/18/17 0034 06/18/17 1007 06/18/17 1747 06/18/17 2337  HGB 11.7*  --   --   --   HCT 34.8*  --   --   --   PLT 252  --   --   --   APTT 39*  --   --   --   LABPROT 13.9  --  15.4*  --   INR 1.07  --  1.21  --   HEPARINUNFRC  --  0.45  --  0.36  CREATININE 1.07  --   --   --   CKTOTAL 303  --   --   --   TROPONINI 1.57* 6.41* 5.95*  --     Estimated Creatinine Clearance: 74.7 mL/min (by C-G formula based on SCr of 1.07 mg/dL).  Assessment: 71 yr old male transferred from [facility] to [facility] s/p cardiac cath today. For PCI on 06/19/17.   Arrived with Heparin drip running at 1200 units/hr.  Heparin level therapeutic after being resumed post cath.    Goal of Therapy:  Heparin level 0.3-0.7 units/ml Monitor platelets by anticoagulation protocol: Yes   Plan:  1. Continue heparin drip at 1200 units/hr. 2. Daily heparin level and CBC while on heparin.  Vincenza Hews, PharmD, BCPS 06/19/2017, 12:29 AM

## 2017-06-19 NOTE — Progress Notes (Signed)
Progress Note  Patient Name: Kenneth Duarte Date of Encounter: 06/19/2017  Primary Cardiologist: [facility]   Subjective   Mild chest pain this am. No dyspnea.   Inpatient Medications    Scheduled Meds: . amLODipine  10 mg Oral Daily  . aspirin  325 mg Oral Daily  . atorvastatin  80 mg Oral QHS  . budesonide (PULMICORT) nebulizer solution  0.25 mg Nebulization BID  . carvedilol  6.25 mg Oral BID  . docusate sodium  100 mg Oral BID  . gabapentin  1,600 mg Oral TID  . lisinopril  20 mg Oral Daily  . pantoprazole  40 mg Oral Daily  . pioglitazone  15 mg Oral QHS  . sodium chloride flush  3 mL Intravenous Q12H  . sodium chloride flush  3 mL Intravenous Q12H   Continuous Infusions: . sodium chloride    . sodium chloride    . sodium chloride 1 mL/kg/hr (06/19/17 0710)  . heparin 1,200 Units/hr (06/19/17 0700)  . nitroGLYCERIN 20 mcg/min (06/19/17 0700)   PRN Meds: sodium chloride, sodium chloride, acetaminophen, nitroGLYCERIN, ondansetron (ZOFRAN) IV, oxyCODONE-acetaminophen **AND** oxyCODONE, sodium chloride flush, sodium chloride flush   Vital Signs    Vitals:   06/19/17 0645 06/19/17 0700 06/19/17 0715 06/19/17 0747  BP: 108/62 (!) 106/58 (!) 102/55   Pulse: 73 70 68   Resp: 16 16 16    Temp:   97.8 F (36.6 C)   TempSrc:   Oral   SpO2: 99% 100% 99% 100%  Weight:      Height:        Intake/Output Summary (Last 24 hours) at 06/19/17 0813 Last data filed at 06/19/17 0700  Gross per 24 hour  Intake           502.69 ml  Output             1475 ml  Net          -972.31 ml   Filed Weights   06/18/17 1721 06/19/17 0500  Weight: 193 lb 2 oz (87.6 kg) 194 lb 7.1 oz (88.2 kg)    Telemetry    Sinus - Personally Reviewed  ECG    NSR, diffuse ST depression- Personally Reviewed  Physical Exam    Labs    Chemistry Recent Labs Lab 06/18/17 0034 06/19/17 0526  NA 133* 139  K 4.6 4.4  CL 101 107  CO2 26 24  GLUCOSE 216* 140*  BUN 14 9    CREATININE 1.07 0.94  CALCIUM 9.0 8.7*  PROT 7.9  --   ALBUMIN 4.0  --   AST 34  --   ALT 18  --   ALKPHOS 92  --   BILITOT 0.9  --   GFRNONAA >60 >60  GFRAA >60 >60  ANIONGAP 6 8     Hematology Recent Labs Lab 06/18/17 0034 06/19/17 0526  WBC 7.4 5.9  RBC 4.50 3.95*  HGB 11.7* 9.8*  HCT 34.8* 32.2*  MCV 77.2* 81.5  MCH 26.0 24.8*  MCHC 33.7 30.4  RDW 16.4* 15.2  PLT 252 182    Cardiac Enzymes Recent Labs Lab 06/18/17 1007 06/18/17 1747 06/18/17 2337 06/19/17 0526  TROPONINI 6.41* 5.95* 5.08* 5.67*   No results for input(s): TROPIPOC in the last 168 hours.   BNPNo results for input(s): BNP, PROBNP in the last 168 hours.   DDimer No results for input(s): DDIMER in the last 168 hours.   Radiology    Dg Chest 2 View  Result Date: 06/18/2017 CLINICAL DATA:  Chest pain. EXAM: CHEST  2 VIEW COMPARISON:  05/16/2017 FINDINGS: Post median sternotomy and CABG. Unchanged heart size and mediastinal contours, borderline cardiomegaly. Pulmonary vasculature is normal. Streaky bibasilar atelectasis. No confluent consolidation, pleural effusion, or pneumothorax. No acute osseous abnormalities are seen. IMPRESSION: Bibasilar atelectasis. Post CABG with borderline cardiomegaly. Electronically Signed   By: Jeb Levering M.D.   On: 06/18/2017 01:09    Cardiac Studies   Cardiac cath 05/19/17: Coronary Diagrams   Diagnostic Diagram          Patient Profile     71 y.o. male yo male with h/o HTN, HLD, CAD s/p CABG April 2018 admitted to [facility] with c/o chest pain. He ruled in for an MI with elevated troponin. Cardiac cath at [facility] per Dr.   Assessment & Plan    1. CAD/NSTEMI: He is s/p CABG in April. Admitted to [facility] with a NSTEMI. Cardiac cath at [facility] per Dr. Saralyn Pilar on 05/19/17. He has early graft failure. The SVG to the PDA is occluded. The SVG to the OM has high grade disease in the proximal body of the vein graft. The LIMA is patent with stenosis at the anastomosis to  the LAD but the native LAD has only moderate disease.  Plans for PCI of the SVG to OM today. Medical management of other CAD. Long discussion yesterday and today with the patient and his family regarding the severity of his CAD with limited options for revascularization. He has no understanding for the complexity of his situation and was under the impression that we would be able to fix all of his blockages and he would be back to normal.  Continue beta blocker. Given relative hypotension on IV NTG, will hold Norvasc today and lower Lisinopril dose to 10 mg daily. Continue Coreg. Continue IV heparin and IV NTG pre-cath. Plavix 600 mg given this am. Will lower ASA to 81 mg daily.   Signed, Lauree Chandler, MD  06/19/2017, 8:13 AM

## 2017-06-19 NOTE — H&P (View-Only) (Signed)
Progress Note  Patient Name: Kenneth Duarte Date of Encounter: 06/19/2017  Primary Cardiologist: [facility]   Subjective   Mild chest pain this am. No dyspnea.   Inpatient Medications    Scheduled Meds: . amLODipine  10 mg Oral Daily  . aspirin  325 mg Oral Daily  . atorvastatin  80 mg Oral QHS  . budesonide (PULMICORT) nebulizer solution  0.25 mg Nebulization BID  . carvedilol  6.25 mg Oral BID  . docusate sodium  100 mg Oral BID  . gabapentin  1,600 mg Oral TID  . lisinopril  20 mg Oral Daily  . pantoprazole  40 mg Oral Daily  . pioglitazone  15 mg Oral QHS  . sodium chloride flush  3 mL Intravenous Q12H  . sodium chloride flush  3 mL Intravenous Q12H   Continuous Infusions: . sodium chloride    . sodium chloride    . sodium chloride 1 mL/kg/hr (06/19/17 0710)  . heparin 1,200 Units/hr (06/19/17 0700)  . nitroGLYCERIN 20 mcg/min (06/19/17 0700)   PRN Meds: sodium chloride, sodium chloride, acetaminophen, nitroGLYCERIN, ondansetron (ZOFRAN) IV, oxyCODONE-acetaminophen **AND** oxyCODONE, sodium chloride flush, sodium chloride flush   Vital Signs    Vitals:   06/19/17 0645 06/19/17 0700 06/19/17 0715 06/19/17 0747  BP: 108/62 (!) 106/58 (!) 102/55   Pulse: 73 70 68   Resp: 16 16 16    Temp:   97.8 F (36.6 C)   TempSrc:   Oral   SpO2: 99% 100% 99% 100%  Weight:      Height:        Intake/Output Summary (Last 24 hours) at 06/19/17 0813 Last data filed at 06/19/17 0700  Gross per 24 hour  Intake           502.69 ml  Output             1475 ml  Net          -972.31 ml   Filed Weights   06/18/17 1721 06/19/17 0500  Weight: 193 lb 2 oz (87.6 kg) 194 lb 7.1 oz (88.2 kg)    Telemetry    Sinus - Personally Reviewed  ECG    NSR, diffuse ST depression- Personally Reviewed  Physical Exam    Labs    Chemistry Recent Labs Lab 06/18/17 0034 06/19/17 0526  NA 133* 139  K 4.6 4.4  CL 101 107  CO2 26 24  GLUCOSE 216* 140*  BUN 14 9    CREATININE 1.07 0.94  CALCIUM 9.0 8.7*  PROT 7.9  --   ALBUMIN 4.0  --   AST 34  --   ALT 18  --   ALKPHOS 92  --   BILITOT 0.9  --   GFRNONAA >60 >60  GFRAA >60 >60  ANIONGAP 6 8     Hematology Recent Labs Lab 06/18/17 0034 06/19/17 0526  WBC 7.4 5.9  RBC 4.50 3.95*  HGB 11.7* 9.8*  HCT 34.8* 32.2*  MCV 77.2* 81.5  MCH 26.0 24.8*  MCHC 33.7 30.4  RDW 16.4* 15.2  PLT 252 182    Cardiac Enzymes Recent Labs Lab 06/18/17 1007 06/18/17 1747 06/18/17 2337 06/19/17 0526  TROPONINI 6.41* 5.95* 5.08* 5.67*   No results for input(s): TROPIPOC in the last 168 hours.   BNPNo results for input(s): BNP, PROBNP in the last 168 hours.   DDimer No results for input(s): DDIMER in the last 168 hours.   Radiology    Dg Chest 2 View  Result Date: 06/18/2017 CLINICAL DATA:  Chest pain. EXAM: CHEST  2 VIEW COMPARISON:  05/16/2017 FINDINGS: Post median sternotomy and CABG. Unchanged heart size and mediastinal contours, borderline cardiomegaly. Pulmonary vasculature is normal. Streaky bibasilar atelectasis. No confluent consolidation, pleural effusion, or pneumothorax. No acute osseous abnormalities are seen. IMPRESSION: Bibasilar atelectasis. Post CABG with borderline cardiomegaly. Electronically Signed   By: Jeb Levering M.D.   On: 06/18/2017 01:09    Cardiac Studies   Cardiac cath 05/19/17: Coronary Diagrams   Diagnostic Diagram          Patient Profile     71 y.o. male yo male with h/o HTN, HLD, CAD s/p CABG April 2018 admitted to [facility] with c/o chest pain. He ruled in for an MI with elevated troponin. Cardiac cath at [facility] per Dr.   Assessment & Plan    1. CAD/NSTEMI: He is s/p CABG in April. Admitted to [facility] with a NSTEMI. Cardiac cath at [facility] per Dr. Saralyn Pilar on 05/19/17. He has early graft failure. The SVG to the PDA is occluded. The SVG to the OM has high grade disease in the proximal body of the vein graft. The LIMA is patent with stenosis at the anastomosis to  the LAD but the native LAD has only moderate disease.  Plans for PCI of the SVG to OM today. Medical management of other CAD. Long discussion yesterday and today with the patient and his family regarding the severity of his CAD with limited options for revascularization. He has no understanding for the complexity of his situation and was under the impression that we would be able to fix all of his blockages and he would be back to normal.  Continue beta blocker. Given relative hypotension on IV NTG, will hold Norvasc today and lower Lisinopril dose to 10 mg daily. Continue Coreg. Continue IV heparin and IV NTG pre-cath. Plavix 600 mg given this am. Will lower ASA to 81 mg daily.   Signed, Lauree Chandler, MD  06/19/2017, 8:13 AM

## 2017-06-20 ENCOUNTER — Encounter (HOSPITAL_COMMUNITY): Payer: Self-pay | Admitting: *Deleted

## 2017-06-20 DIAGNOSIS — D649 Anemia, unspecified: Secondary | ICD-10-CM

## 2017-06-20 DIAGNOSIS — I25810 Atherosclerosis of coronary artery bypass graft(s) without angina pectoris: Secondary | ICD-10-CM

## 2017-06-20 DIAGNOSIS — E785 Hyperlipidemia, unspecified: Secondary | ICD-10-CM

## 2017-06-20 LAB — CBC
HCT: 31.5 % — ABNORMAL LOW (ref 39.0–52.0)
HCT: 32.1 % — ABNORMAL LOW (ref 39.0–52.0)
Hemoglobin: 9.6 g/dL — ABNORMAL LOW (ref 13.0–17.0)
Hemoglobin: 9.7 g/dL — ABNORMAL LOW (ref 13.0–17.0)
MCH: 24.7 pg — ABNORMAL LOW (ref 26.0–34.0)
MCH: 25.4 pg — ABNORMAL LOW (ref 26.0–34.0)
MCHC: 29.9 g/dL — ABNORMAL LOW (ref 30.0–36.0)
MCHC: 30.8 g/dL (ref 30.0–36.0)
MCV: 82.5 fL (ref 78.0–100.0)
MCV: 82.5 fL (ref 78.0–100.0)
Platelets: 165 10*3/uL (ref 150–400)
Platelets: 174 10*3/uL (ref 150–400)
RBC: 3.82 MIL/uL — ABNORMAL LOW (ref 4.22–5.81)
RBC: 3.89 MIL/uL — ABNORMAL LOW (ref 4.22–5.81)
RDW: 15.3 % (ref 11.5–15.5)
RDW: 15.4 % (ref 11.5–15.5)
WBC: 5.1 10*3/uL (ref 4.0–10.5)
WBC: 5.1 10*3/uL (ref 4.0–10.5)

## 2017-06-20 LAB — BASIC METABOLIC PANEL
Anion gap: 5 (ref 5–15)
BUN: 10 mg/dL (ref 6–20)
CO2: 26 mmol/L (ref 22–32)
Calcium: 8.7 mg/dL — ABNORMAL LOW (ref 8.9–10.3)
Chloride: 108 mmol/L (ref 101–111)
Creatinine, Ser: 0.99 mg/dL (ref 0.61–1.24)
GFR calc Af Amer: >60 mL/min (ref >60)
GFR calc non Af Amer: >60 mL/min (ref >60)
Glucose, Bld: 170 mg/dL — ABNORMAL HIGH (ref 65–99)
Potassium: 4.3 mmol/L (ref 3.5–5.1)
Sodium: 139 mmol/L (ref 135–145)

## 2017-06-20 LAB — GLUCOSE, CAPILLARY: Glucose-Capillary: 127 mg/dL — ABNORMAL HIGH (ref 65–99)

## 2017-06-20 MED ORDER — ASPIRIN EC 81 MG PO TBEC
81.0000 mg | DELAYED_RELEASE_TABLET | Freq: Every day | ORAL | 1 refills | Status: AC
Start: 2017-06-20 — End: ?

## 2017-06-20 MED ORDER — PANTOPRAZOLE SODIUM 40 MG PO TBEC: 40.0000 mg | DELAYED_RELEASE_TABLET | Freq: Every day | ORAL | 1 refills | Status: DC

## 2017-06-20 MED ORDER — CLOPIDOGREL BISULFATE 75 MG PO TABS: 75.0000 mg | ORAL_TABLET | Freq: Every day | ORAL | 1 refills | Status: DC

## 2017-06-20 MED ORDER — LISINOPRIL 10 MG PO TABS: 10.0000 mg | ORAL_TABLET | Freq: Every day | ORAL | 1 refills | Status: DC

## 2017-06-20 NOTE — Progress Notes (Signed)
Progress Note  Patient Name: [name] Date of Encounter: 06/20/2017  Primary Cardiologist: Nehemiah Massed [facility]  Subjective   No chest pain or dyspnea.   Inpatient Medications    Scheduled Meds: . aspirin  81 mg Oral Daily  . atorvastatin  80 mg Oral QHS  . budesonide (PULMICORT) nebulizer solution  0.25 mg Nebulization BID  . carvedilol  6.25 mg Oral BID  . clopidogrel  75 mg Oral Q breakfast  . docusate sodium  100 mg Oral BID  . gabapentin  1,600 mg Oral TID  . heart attack bouncing book   Does not apply Once  . lisinopril  10 mg Oral Daily  . pantoprazole  40 mg Oral Daily  . pioglitazone  15 mg Oral QHS  . sodium chloride flush  3 mL Intravenous Q12H  . sodium chloride flush  3 mL Intravenous Q12H   Continuous Infusions: . sodium chloride    . sodium chloride    . nitroGLYCERIN Stopped (06/19/17 1328)   PRN Meds: sodium chloride, sodium chloride, acetaminophen, nitroGLYCERIN, ondansetron (ZOFRAN) IV, oxyCODONE-acetaminophen **AND** oxyCODONE, sodium chloride flush, sodium chloride flush   Vital Signs    Vitals:   06/19/17 2032 06/19/17 2350 06/20/17 0424 06/20/17 0748  BP: (!) 111/59 (!) 101/54 (!) 100/58 (!) 106/58  Pulse: 75 78 82 75  Resp: 15 17 16 15   Temp: 98.8 F (37.1 C) 98.2 F (36.8 C) 98.5 F (36.9 C) 98.2 F (36.8 C)  TempSrc: Oral Oral Oral Oral  SpO2: 99% 98% 100% 100%  Weight:   195 lb 11.2 oz (88.8 kg)   Height:        Intake/Output Summary (Last 24 hours) at 06/20/17 0801 Last data filed at 06/19/17 2300  Gross per 24 hour  Intake          2814.15 ml  Output             1100 ml  Net          1714.15 ml   Filed Weights   06/18/17 1721 06/19/17 0500 06/20/17 0424  Weight: 193 lb 2 oz (87.6 kg) 194 lb 7.1 oz (88.2 kg) 195 lb 11.2 oz (88.8 kg)    Telemetry    sinus - Personally Reviewed  ECG    Sinus, diffuse 1 mm ST depression, improved. - Personally Reviewed  Physical Exam    General: Well developed, well nourished,  NAD  HEENT: OP clear, mucus membranes moist  SKIN: warm, dry. No rashes. Neuro: No focal deficits  Musculoskeletal: Muscle strength 5/5 all ext  Psychiatric: Mood and affect normal  Neck: No JVD, no carotid bruits, no thyromegaly, no lymphadenopathy.  Lungs:Clear bilaterally, no wheezes, rhonci, crackles Cardiovascular: Regular rate and rhythm. No murmurs, gallops or rubs. Abdomen:Soft. Bowel sounds present. Non-tender.  Extremities: No lower extremity edema. Pulses are 2 + in the bilateral DP/PT.    Labs    Chemistry Recent Labs Lab 06/18/17 0034 06/19/17 0526 06/20/17 0003  NA 133* 139 139  K 4.6 4.4 4.3  CL 101 107 108  CO2 26 24 26   GLUCOSE 216* 140* 170*  BUN 14 9 10   CREATININE 1.07 0.94 0.99  CALCIUM 9.0 8.7* 8.7*  PROT 7.9  --   --   ALBUMIN 4.0  --   --   AST 34  --   --   ALT 18  --   --   ALKPHOS 92  --   --   BILITOT 0.9  --   --  GFRNONAA >60 >60 >60  GFRAA >60 >60 >60  ANIONGAP 6 8 5      Hematology Recent Labs Lab 06/18/17 0034 06/19/17 0526 06/20/17 0003  WBC 7.4 5.9 5.1  5.1  RBC 4.50 3.95* 3.89*  3.82*  HGB 11.7* 9.8* 9.6*  9.7*  HCT 34.8* 32.2* 32.1*  31.5*  MCV 77.2* 81.5 82.5  82.5  MCH 26.0 24.8* 24.7*  25.4*  MCHC 33.7 30.4 29.9*  30.8  RDW 16.4* 15.2 15.4  15.3  PLT 252 182 174  165    Cardiac Enzymes Recent Labs Lab 06/18/17 1007 06/18/17 1747 06/18/17 2337 06/19/17 0526  TROPONINI 6.41* 5.95* 5.08* 5.67*   No results for input(s): TROPIPOC in the last 168 hours.   BNPNo results for input(s): BNP, PROBNP in the last 168 hours.   DDimer No results for input(s): DDIMER in the last 168 hours.   Radiology    No results found.   Patient Profile     71 y.o. male yo male with h/o HTN, HLD, CAD s/p CABG April 2018 admitted to [facility] with c/o chest pain. He ruled in for an MI with elevated troponin. Cardiac cath at [facility] per Dr. Saralyn Pilar and found to have occlusion of the SVG to RCA and severe stenosis SVG to OM.  Transferred to [facility] for PCI of the SVG to OM.   Assessment & Plan    1. CAD/NSTEMI: Pt transferred from [facility] for PCI. Admitted there with unstable angina and found to have a NSTEMI. He is followed by Dr. Nehemiah Massed. He is now s/p DES x 1 in the proximal body of SVG to OM. He is on ASA and Plavix. He will need DAPT for lifetime. Continue ASA, Plavix, high dose statin, beta blocker and Ace-inh.   Discharge home today. Follow up one week with Dr. Nehemiah Massed  Signed, Lauree Chandler, MD  06/20/2017, 8:01 AM

## 2017-06-20 NOTE — Discharge Summary (Signed)
Discharge Summary    Patient ID: Kenneth Duarte,  MRN: [id number], DOB/AGE: 01/27/1946 71 y.o.  Admit date: 06/18/2017 Discharge date: 06/20/2017  Primary Care Provider: Ellamae Sia Primary Cardiologist: Dr. Nehemiah Massed  Discharge Diagnoses    Principal Problem:   NSTEMI (non-ST elevated myocardial infarction) [facility]) Active Problems:   CAD (coronary artery disease) of bypass graft   Benign essential HTN   Diabetes mellitus (HCC)   S/P CABG x 3   Anemia, unspecified   Dyslipidemia    Diagnostic Studies/Procedures    [facility] 06/18/17 Physicians   Panel Physicians Referring Physician Case Authorizing Physician  Paraschos, Sheppard Coil, MD (Primary)  Lujean Amel D, MD  Procedures   Left Heart Cath and Cors/Grafts Angiography and PCI  Conclusion     Ost Cx to Prox Cx lesion, 100 %stenosed.  Ost LM lesion, 50 %stenosed.  Ost LAD lesion, 30 %stenosed.  Mid RCA lesion, 95 %stenosed.  SVG.  Origin lesion, 100 %stenosed.  SVG.  Origin to Prox Graft lesion, 99 %stenosed.  Dist Graft to Insertion lesion, 60 %stenosed.  1st Mrg lesion, 99 %stenosed.  LIMA.  Dist Graft lesion, 75 %stenosed.   1. Severe three-vessel coronary artery disease with heavily calcified, eccentric 50-60% stenosis distal left main, occluded proximal left circumflex, subtotal mid RCA. 2. Patent LIMA to mid LAD with discrete 75% stenosis near anastomotic site 3. Occluded SVG to PDA 4. Patent SVG to OM1 with a long diffuse 95% stenosis proximal segment of graft, with near 60-70% stenosis at the anastomotic site, 95% stenosis in the native vessel distal to the anastomotic site, with retrograde filling of left circumflex 5. Mildly reduced left ventricular function, with inferior-posterior wall akinesis  Recommendations  1. Transfer to [facility] for high risk, technically challenging PCI versus redo CABG   [facility] 06/19/17 Physicians   Panel Physicians Referring Physician Case Authorizing  Physician  Jettie Booze, MD (Primary)    Procedures   Coronary Stent Intervention  Conclusion     Origin to Prox Graft lesion, 99 %stenosed, in the SVG to OM.  A STENT PROMUS PREM MR 4.0X38 drug eluting stent was successfully placed. IC Adenosine given prior to angioplasty. No distal protection used since it is a recently placed graft.  Post intervention, there is a 0% residual stenosis.  Dist Graft lesion, 75 %stenosed. There is more disease noted past the graft insertion in the OM and in the distal circumflex.   Continue dual antiplatelet therapy along with aggressive secondary prevention.     Indications   Non-ST elevation (NSTEMI) myocardial infarction ([place]) [I21.4 (ICD-10-CM)]  Procedural Details/Technique   Technical Details The risks, benefits, and details of the procedure were explained to the patient. The patient verbalized understanding and wanted to proceed. Informed written consent was obtained.  PROCEDURE TECHNIQUE: After Xylocaine anesthesia a 83F slender sheath was placed in the left radial artery with a single anterior needle wall stick. IV Heparin was given. SVG to OM angiography was done using an AL1 guide catheter. A TR band was used for hemostasis.  ACT was used to check the anticoagulation.  Contrast: 25 cc     Estimated blood loss <50 mL.  During this procedure the patient was administered the following to achieve and maintain moderate conscious sedation: Versed 2 mg, Fentanyl 25 mcg, while the patient's heart rate, blood pressure, and oxygen saturation were continuously monitored. The period of conscious sedation was 47 minutes, of which I was present face-to-face 100% of this time.  Complications   Complications documented before study signed (06/19/2017 2:13 PM EDT)    No complications were associated with this study.  Documented by Jettie Booze, MD - 06/19/2017 2:04 PM EDT    Coronary Findings   Dominance: Right  Left Main    Vessel was not injected.  Ost LM lesion, 50% stenosed. The lesion is tubular. The lesion is calcified.  Left Anterior Descending  Vessel was not injected.  Ost LAD lesion, 30% stenosed. The lesion is tubular. The lesion is calcified.  Ramus Intermedius  Vessel was not injected.  Left Circumflex  Vessel was not injected.  Ost Cx to Prox Cx lesion, 100% stenosed.  Mid Cx lesion, 80% stenosed.  First Obtuse Marginal Branch  1st Mrg lesion, 99% stenosed. The lesion is discrete.  Right Coronary Artery  Vessel was not injected.  Mid RCA lesion, 95% stenosed. The lesion is eccentric and irregular. The lesion is calcified.  Graft Angiography  saphenous Graft to RPDA  SVG graft was not injected.  Origin lesion, 100% stenosed.  saphenous Graft to 1st Mrg  SVG.  Origin to Prox Graft lesion, 99% stenosed. The lesion is eccentric and irregular.  Angioplasty: Lesion crossed with guidewire using a WIRE ASAHI PROWATER 180CM. Pre-stent angioplasty was performed using a BALLOON SAPPHIRE 2.0X20. A STENT PROMUS PREM MR 4.0X38 drug eluting stent was successfully placed. Stent strut is well apposed. Post-stent angioplasty was not performed. The pre-interventional distal flow is normal (TIMI 3). The post-interventional distal flow is normal (TIMI 3). The intervention was successful . No complications occurred at this lesion.  There is no residual stenosis post intervention.  Dist Graft to Insertion lesion, 60% stenosed. The lesion is discrete.  Free LIMA Graft to Dist LAD  LIMA graft was not injected.  Dist Graft lesion, 75% stenosed. The lesion is discrete.  Coronary Diagrams   Diagnostic Diagram       Post-Intervention Diagram          _____________     History of Present Illness     Kenneth Duarte is a 71 y.o. male with history of CAD s/p 3V CABG (03/2017), DMT2, HTN, HLD, chronic pain, ED, GERD, neuropathy who presented upon transfer from [facility] to [facility] with NSTEMI.   Hospital Course     He was initially seen in 02/2017 for angina and equivolcal stress test. He continue to have chest pain and underwent cath in 02/2017 showing severe 2V CAD. There wassignificant stenosis of RCA and left PDA with moderate stenosis of left main and LAD. Medical management was recommended. The patient's anti-anginal medications were increased, which unfortunately did not provided relief. Due to this the patient was referred to [facility] for possible coronary bypass procedure. He underwent 3V CABG with LIMA to LAD, SVG to OM, and SVG to PDA by Dr. Nils Pyle in 03/2017.   He was doing okay until 2 days prior to admission when he presented with recurrent CP relieved by SL NTG. He presented back to [facility] and was found to have ST depression in lateral leads and elevated troponin @ 1.57--> 6.41. He was placed on IV heparin. He was taken back for cath today which showed severe 3V CAD with 75% occl LIMA--> LAD near anastomotic site, occl SVG--> PDA and 95% occl SVG--> OM1 and 95% occl of native vessel distal to anastomotic site. He was transferred to [facility] for high risk PCI vs redo CABG. Interventional team reviewed his films and felt that PCI was most appropriate, with  medical therapy of his other disease. On 06/19/17 he underwent DES to the proximal body of the SVG-OM, cath otherwise showed 75% distal graft lesion. It was felt he would need DAPT for lifetime. Today he is feeling well. His amlodipine was stopped and lisinopril was cut down to 10mg  daily due to softer blood pressures. He was on 325mg  of aspirin prior to admission and this was cut down to 81mg  per day. His omeprazole was changed to Protonix given Plavix rx. He was asked to resume metformin on 06/22/17 to allow >48 hours post-cath delay. Dr. Angelena Form has seen and examined the patient today and feels he is stable for discharge. Labs otherwise this admission are notable for normal TSH, chronic appearing anemia, LDL 29, trig 198. He was advised to f/u closely with his  cardiologist within 1 week (Dr. Nehemiah Massed at [facility] in [place]) as well as primary care for his anemia - prior Hgb was 8.8 in April, then 11.7->9.8->9.7 this admission). _____________  Discharge Vitals Blood pressure (!) 106/58, pulse 72, temperature 98.2 F (36.8 C), temperature source Oral, resp. rate 14, height 6\' 1"  (1.854 m), weight 195 lb 11.2 oz (88.8 kg), SpO2 100 %.  Filed Weights   06/18/17 1721 06/19/17 0500 06/20/17 0424  Weight: 193 lb 2 oz (87.6 kg) 194 lb 7.1 oz (88.2 kg) 195 lb 11.2 oz (88.8 kg)    Labs & Radiologic Studies    CBC  Recent Labs  06/19/17 0526 06/20/17 0003  WBC 5.9 5.1  5.1  HGB 9.8* 9.6*  9.7*  HCT 32.2* 32.1*  31.5*  MCV 81.5 82.5  82.5  PLT 182 174  712   Basic Metabolic Panel  Recent Labs  06/18/17 0034 06/19/17 0526 06/20/17 0003  NA 133* 139 139  K 4.6 4.4 4.3  CL 101 107 108  CO2 26 24 26   GLUCOSE 216* 140* 170*  BUN 14 9 10   CREATININE 1.07 0.94 0.99  CALCIUM 9.0 8.7* 8.7*  MG 1.7  --   --    Liver Function Tests  Recent Labs  06/18/17 0034  AST 34  ALT 18  ALKPHOS 92  BILITOT 0.9  PROT 7.9  ALBUMIN 4.0   Cardiac Enzymes  Recent Labs  06/18/17 0034  06/18/17 1747 06/18/17 2337 06/19/17 0526  CKTOTAL 303  --   --   --   --   TROPONINI 1.57*  < > 5.95* 5.08* 5.67*  < > = values in this interval not displayed. Fasting Lipid Panel  Recent Labs  06/18/17 2337  CHOL 91  HDL 22*  LDLCALC 29  TRIG 198*  CHOLHDL 4.1   Thyroid Function Tests  Recent Labs  06/18/17 1747  TSH 2.814   _____________  Dg Chest 2 View  Result Date: 06/18/2017 CLINICAL DATA:  Chest pain. EXAM: CHEST  2 VIEW COMPARISON:  05/16/2017 FINDINGS: Post median sternotomy and CABG. Unchanged heart size and mediastinal contours, borderline cardiomegaly. Pulmonary vasculature is normal. Streaky bibasilar atelectasis. No confluent consolidation, pleural effusion, or pneumothorax. No acute osseous abnormalities are seen.  IMPRESSION: Bibasilar atelectasis. Post CABG with borderline cardiomegaly. Electronically Signed   By: Jeb Levering M.D.   On: 06/18/2017 01:09   Disposition   Pt is being discharged home today in good condition.  Follow-up Plans & Appointments     Discharge Instructions    Diet - low sodium heart healthy    Complete by:  As directed    Increase activity slowly    Complete by:  As directed    No driving for 1 week. No lifting over 10 lbs for 2 weeks. No sexual activity for 2 weeks. You may not return to work until cleared by cardiologist, if applicable. Keep procedure site clean & dry. If you notice increased pain, swelling, bleeding or pus, call/return!  You may shower, but no soaking baths/hot tubs/pools for 1 week.   -Your amlodipine was STOPPED due to lower blood pressure. -Your lisinopril dose was DECREASED due to lower blood pressure. -Your aspirin dose was DECREASED to 81mg  per day. - You were started on a blood thinner called Plavix (clopidogrel). -Some studies suggest Prilosec/Omeprazole interacts with your new blood thinner. We changed your Prilosec/Omeprazole to the equivalent dose of Protonix for less chance of interaction.  -You may restart your Metformin on 06/22/17.' - Tylenol is listed on your home med list along with oxycodone/acetaminophen - both of these medicines contain Tylenol/acetaminophen so please do not mix these two together. You cannot take more than 1000mg  of Tylenol/acetaminophen every 6 hours or more than 4000mg  per 24 hours.      Discharge Medications   Allergies as of 06/20/2017      Reactions   Blood-group Specific Substance Other (See Comments)   Patient received almost 2 units FFP during a procedure. Became hypotensive and developed rash   Cortisone Acetate [cortisone] Rash      Medication List    STOP taking these medications   amLODipine 10 MG tablet Commonly known as:  NORVASC   omeprazole 20 MG capsule Commonly known as:   PRILOSEC Replaced by:  pantoprazole 40 MG tablet     TAKE these medications   acetaminophen 500 MG tablet Commonly known as:  TYLENOL Take 2 tablets (1,000 mg total) by mouth every 6 (six) hours as needed. What changed:  reasons to take this   aspirin EC 81 MG tablet Take 1 tablet (81 mg total) by mouth daily. What changed:  medication strength  how much to take   atorvastatin 80 MG tablet Commonly known as:  LIPITOR Take 80 mg by mouth at bedtime.   beclomethasone 40 MCG/ACT inhaler Commonly known as:  QVAR Inhale 2 puffs into the lungs 2 (two) times daily.   carvedilol 6.25 MG tablet Commonly known as:  COREG Take 6.25 mg by mouth 2 (two) times daily.   clopidogrel 75 MG tablet Commonly known as:  PLAVIX Take 1 tablet (75 mg total) by mouth daily.   docusate sodium 100 MG capsule Commonly known as:  COLACE Take 100 mg by mouth 2 (two) times daily.   gabapentin 800 MG tablet Commonly known as:  NEURONTIN Take 1,600 mg by mouth 3 (three) times daily.   lisinopril 10 MG tablet Commonly known as:  PRINIVIL,ZESTRIL Take 1 tablet (10 mg total) by mouth daily. What changed:  medication strength  how much to take   metFORMIN 500 MG 24 hr tablet Commonly known as:  GLUCOPHAGE-XR Take 500 mg by mouth daily.   nitroGLYCERIN 0.4 MG SL tablet Commonly known as:  NITROSTAT Place 0.4 mg under the tongue every 5 (five) minutes as needed for chest pain.   oxyCODONE-acetaminophen 10-325 MG tablet Commonly known as:  PERCOCET Take 1 tablet by mouth every 4 (four) hours as needed for pain.   pantoprazole 40 MG tablet Commonly known as:  PROTONIX Take 1 tablet (40 mg total) by mouth daily. Replaces:  omeprazole 20 MG capsule   pioglitazone 15 MG tablet Commonly known as:  ACTOS Take 15  mg by mouth at bedtime.        Allergies:  Allergies  Allergen Reactions  . Blood-Group Specific Substance Other (See Comments)    Patient received almost 2 units FFP during  a procedure. Became hypotensive and developed rash  . Cortisone Acetate [Cortisone] Rash    Aspirin prescribed at discharge?  Yes High Intensity Statin Prescribed? (Lipitor 40-80mg  or Crestor 20-40mg ): Yes Beta Blocker Prescribed? Yes For EF <40%, was ACEI/ARB Prescribed? No: na ADP Receptor Inhibitor Prescribed? (i.e. Plavix etc.-Includes Medically Managed Patients): Yes For EF <40%, Aldosterone Inhibitor Prescribed? No: na Was EF assessed during THIS hospitalization? Yes - per cath note, LV gram showed Mildly reduced left ventricular function, with inferior-posterior wall akinesis but physician did not give precise number. Was Cardiac Rehab II ordered? (Included Medically managed Patients): was told cardiac rehab would not be in on July 4th so sent staff message to team to refer for phase 2   Outstanding Labs/Studies   N/a  Duration of Discharge Encounter   Greater than 30 minutes including physician time.  Signed, Nedra Hai Kolby Schara PA-C 06/20/2017, 9:22 AM

## 2017-06-20 NOTE — Progress Notes (Signed)
Pt and wife given all discharge instructions and all questions were addressed with pt verbalizing understanding. Pt is aware of all med changes and the pharmacy to pick up his meds. Pt did not have any chest pain or sob with walking from one end of the long hall to the other.Pt discharged via wc with wife to home.

## 2017-06-21 ENCOUNTER — Encounter (HOSPITAL_COMMUNITY): Payer: Self-pay | Admitting: Interventional Cardiology

## 2017-06-25 ENCOUNTER — Telehealth (HOSPITAL_COMMUNITY): Payer: Self-pay | Admitting: *Deleted

## 2017-08-08 NOTE — Addendum Note (Signed)
Addendum  created 08/08/17 1406 by Albertha Ghee, MD   Sign clinical note

## 2017-08-08 NOTE — Addendum Note (Signed)
Addendum  created 08/08/17 1408 by Albertha Ghee, MD   Sign clinical note

## 2017-10-18 ENCOUNTER — Ambulatory Visit (INDEPENDENT_AMBULATORY_CARE_PROVIDER_SITE_OTHER): Payer: Medicare PPO | Admitting: Podiatry

## 2017-10-18 ENCOUNTER — Encounter: Payer: Self-pay | Admitting: Podiatry

## 2017-10-18 DIAGNOSIS — M2042 Other hammer toe(s) (acquired), left foot: Secondary | ICD-10-CM | POA: Diagnosis not present

## 2017-10-18 DIAGNOSIS — M2041 Other hammer toe(s) (acquired), right foot: Secondary | ICD-10-CM | POA: Diagnosis not present

## 2017-10-18 DIAGNOSIS — M129 Arthropathy, unspecified: Secondary | ICD-10-CM | POA: Diagnosis not present

## 2017-10-18 DIAGNOSIS — E1142 Type 2 diabetes mellitus with diabetic polyneuropathy: Secondary | ICD-10-CM

## 2017-10-18 DIAGNOSIS — M2032 Hallux varus (acquired), left foot: Secondary | ICD-10-CM

## 2017-10-18 NOTE — Progress Notes (Signed)
   Subjective:    Patient ID: Kenneth Duarte, male    DOB: 05-05-1946, 71 y.o.   MRN: [id number]  HPI this patient presents the office stating that he is diabetic and he requests a diabetic footgear.  He says he has diabetes and is taking both gabapentin and Plavix.  He has a history of heart pathology. He states he is concerned that his right foot is in large relative to his left foot.  He says he is not having any foot pain, but has neuropathy and no feeling in both feet.  He presents the office today requesting diabetic shoes    Review of Systems  All other systems reviewed and are negative.      Objective:   Physical Exam General Appearance  Alert, conversant and in no acute stress.  Vascular  Dorsalis pedis and posterior pulses are palpable  bilaterally.  Capillary return is within normal limits  Bilaterally. Temperature is within normal limits  Bilaterally  Neurologic  Senn-Weinstein monofilament wire test absent  bilaterally. Muscle power  Within normal limits bilaterally.  Nails Normal nails noted. No evidence of bacterial infection or drainage bilaterally.  Orthopedic  No limitations of motion of motion feet bilaterally.  No crepitus or effusions noted.  No bony pathology or digital deformities noted. Dorsal arthritis midfoot, right greater than the left.  Hallux malleus, left hallux.  Multiple toe contractures on the left foot greater than the right.  A thick disfigured discolored, second toe right foot. Probable old fracture     Skin  normotropic skin with no porokeratosis noted bilaterally.  No signs of infections or ulcers noted.          Assessment & Plan:  Diabetes with neuropathy.  Arthritis bilaterally.  Hallux limitus left hallux.  Contracted digits 2 through 4 left greater than the right foot.  IE  diabetic foot exam was performed.  Due to the fact that his right foot is larger than his left foot. I recommended he be seen by Liliane Channel for him to measure this  patient for diabetic shoes.  He is to return to the office after an appointment is made with [facility].   Gardiner Barefoot DPM

## 2017-10-22 DIAGNOSIS — R600 Localized edema: Secondary | ICD-10-CM | POA: Insufficient documentation

## 2017-10-24 ENCOUNTER — Ambulatory Visit: Payer: Medicare PPO | Admitting: Orthotics

## 2017-10-24 DIAGNOSIS — M129 Arthropathy, unspecified: Secondary | ICD-10-CM

## 2017-10-24 DIAGNOSIS — M2041 Other hammer toe(s) (acquired), right foot: Secondary | ICD-10-CM

## 2017-10-24 DIAGNOSIS — E1142 Type 2 diabetes mellitus with diabetic polyneuropathy: Secondary | ICD-10-CM

## 2017-10-24 DIAGNOSIS — M2042 Other hammer toe(s) (acquired), left foot: Secondary | ICD-10-CM

## 2017-10-24 NOTE — Progress Notes (Signed)
Patient came in today for fitting and eval for diabetic shoes: Patient' doctor here is Prudence Davidson, PCP Quay Burow, [place]  Patient presents with DM2, PN, HT b/l  Patient was measured with brannock device and cast in foam for custom inserts.  10.5 D  Patient chose [id number]

## 2017-10-31 DIAGNOSIS — I5021 Acute systolic (congestive) heart failure: Secondary | ICD-10-CM | POA: Insufficient documentation

## 2017-11-02 ENCOUNTER — Other Ambulatory Visit: Payer: Self-pay | Admitting: Internal Medicine

## 2017-11-02 DIAGNOSIS — R188 Other ascites: Secondary | ICD-10-CM

## 2017-11-02 DIAGNOSIS — R1084 Generalized abdominal pain: Secondary | ICD-10-CM

## 2017-11-07 ENCOUNTER — Ambulatory Visit
Admission: RE | Admit: 2017-11-07 | Discharge: 2017-11-07 | Disposition: A | Discharge status: Home / Self Care | Payer: Medicare PPO | Source: Ambulatory Visit | Attending: Internal Medicine | Admitting: Internal Medicine

## 2017-11-07 DIAGNOSIS — M47896 Other spondylosis, lumbar region: Secondary | ICD-10-CM | POA: Insufficient documentation

## 2017-11-07 DIAGNOSIS — I700 Atherosclerosis of aorta: Secondary | ICD-10-CM | POA: Diagnosis not present

## 2017-11-07 DIAGNOSIS — R918 Other nonspecific abnormal finding of lung field: Secondary | ICD-10-CM | POA: Insufficient documentation

## 2017-11-07 DIAGNOSIS — I2510 Atherosclerotic heart disease of native coronary artery without angina pectoris: Secondary | ICD-10-CM | POA: Insufficient documentation

## 2017-11-07 DIAGNOSIS — R188 Other ascites: Secondary | ICD-10-CM

## 2017-11-07 DIAGNOSIS — R1084 Generalized abdominal pain: Secondary | ICD-10-CM

## 2017-11-07 DIAGNOSIS — M5136 Other intervertebral disc degeneration, lumbar region: Secondary | ICD-10-CM | POA: Insufficient documentation

## 2017-11-07 DIAGNOSIS — R109 Unspecified abdominal pain: Secondary | ICD-10-CM | POA: Diagnosis present

## 2017-11-07 MED ORDER — IOPAMIDOL (ISOVUE-300) INJECTION 61%
50.0000 mL | Freq: Once | INTRAVENOUS | Status: AC | PRN
  Administered 2017-11-07: 100 mL via INTRAVENOUS

## 2017-11-20 ENCOUNTER — Encounter: Payer: Medicare PPO | Admitting: Orthotics

## 2017-11-30 ENCOUNTER — Telehealth: Payer: Self-pay | Admitting: Podiatry

## 2017-11-30 NOTE — Telephone Encounter (Signed)
Pt called this morning checking on status of diabetic shoes.  I checked with Alyse Low and they were not in [place] office and pt was notified. I then called [employer] and Joneen Caraway checked on the pt and said they should begin working on them the beginning of next week and are hopefully they will get here by end of year.  I contacted pt and he said this is the last year insurance is going to pay for them so he hopes they come in by end of year.I told him I would send message to [place] to call pt as soon as shoes come in.

## 2017-12-29 NOTE — Progress Notes (Signed)
Cardiology Office Note  Date:  01/01/2018   ID:  Kenneth Duarte, DOB 07-15-1946, MRN [id number]  PCP:  Ellamae Sia, MD   Chief Complaint  Patient presents with  . 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 Meds reviewed verbally with pt.    HPI:  Kenneth Duarte is a 72 year old gentleman with a history of Smoker quit 1980 CAD, CABG 3V CABG with LIMA to LAD, SVG to OM, and SVG to PDA by Dr. Nils Pyle in 03/2017.  Chest pain, transferred to [facility] Had stent placed to ostial vein graft to OM July 2018 Who presents to establish care in the [facility] office, follow-up of his coronary disease  Details from his previous cardiac catheterizations reviewed below Discussed with him on today's visit He does have rare episodes of chest pain with heavy exertion consistent with stable angina Was told by previous cardiology team at [facility] that he might not make it Feels that he should not be doing anything Told by Dr. Darcey Nora that stent can be placed and then he should be more active Has slowly started getting back into it Light chain sawing, moving wood Has not had to take nitro since stent placed July 2018  Rare episodes of dizziness when he stands up not severe  EKG personally reviewed by myself on todays visit Shows normal sinus rhythm rate 71 bpm nonspecific ST abnormality  Other past medical history reviewed ST depression in lateral leads and elevated troponin @ 1.57--> 6.41. 3V CAD with 75% occl LIMA-->LAD near anastomotic site, occl SVG--> PDA and 95% occl SVG--> OM1 and 95% occl of native vessel distal to anastomotic site.  Cardiac catheterization July 2018 1. Severe three-vessel coronary artery disease with heavily calcified, eccentric 50-60% stenosis distal left main, occluded proximal left circumflex, subtotal mid RCA. 2. Patent LIMA to mid LAD with discrete 75% stenosis near anastomotic site 3. Occluded SVG to PDA 4. Patent SVG to OM1 with a long  diffuse 95% stenosis proximal segment of graft, with near 60-70% stenosis at the anastomotic site, 95% stenosis in the native vessel distal to the anastomotic site, with retrograde filling of left circumflex 5. Mildly reduced left ventricular function, with inferior-posterior wall akinesis  Transferred to [facility] Had stent placed to ostial vein graft to OM Dist Graft lesion, 75 %stenosed. There is more disease noted past the graft insertion in the OM and in the distal circumflex.              PMH:   has a past medical history of Arthritis, Chronic pain syndrome, Congestive heart failure (Kenneth Duarte) (1980), Coronary artery disease, Diabetes ([place]), Dyspnea, ED (erectile dysfunction), GERD (gastroesophageal reflux disease), Gout, Headache, Hyperlipemia, Hypertension, Neuropathy of both feet, Seborrheic keratosis, Sinus congestion, and Wears dentures.  PSH:    Past Surgical History:  Procedure Laterality Date  . COLONOSCOPY WITH PROPOFOL N/A 10/25/2015   Procedure: COLONOSCOPY WITH PROPOFOL;  Surgeon: Lucilla Lame, MD;  Location: [place];  Service: Endoscopy;  Laterality: N/A;  DIABETIC-ORAL MEDS  . CORONARY ARTERY BYPASS GRAFT N/A 04/10/2017   Procedure: CORONARY ARTERY BYPASS GRAFTING (CABG) x three , using left internal mammary artery and right leg greater saphenous vein harvested endoscopically;  Surgeon: Ivin Poot, MD;  Location: [place];  Service: Open Heart Surgery;  Laterality: N/A;  . CORONARY ARTERY BYPASS GRAFT    . CORONARY STENT INTERVENTION N/A 06/19/2017   Procedure: Coronary Stent Intervention;  Surgeon: Jettie Booze, MD;  Location:  [place] INVASIVE CV LAB;  Service: Cardiovascular;  Laterality: N/A;  . KNEE SURGERY Right    over 20 years ago  . LEFT HEART CATH AND CORONARY ANGIOGRAPHY Left 02/22/2017   Procedure: Left Heart Cath and Coronary Angiography;  Surgeon: Corey Skains, MD;  Location: [place] CV LAB;  Service: Cardiovascular;  Laterality: Left;  .  LEFT HEART CATH AND CORS/GRAFTS ANGIOGRAPHY N/A 06/18/2017   Procedure: Left Heart Cath and Cors/Grafts Angiography and PCI;  Surgeon: Isaias Cowman, MD;  Location: [place] CV LAB;  Service: Cardiovascular;  Laterality: N/A;  . POLYPECTOMY  10/25/2015   Procedure: POLYPECTOMY;  Surgeon: Lucilla Lame, MD;  Location: [place];  Service: Endoscopy;;  . ROTATOR CUFF REPAIR Right 2012  . TEE WITHOUT CARDIOVERSION N/A 04/10/2017   Procedure: TRANSESOPHAGEAL ECHOCARDIOGRAM (TEE);  Surgeon: Ivin Poot, MD;  Location: [place];  Service: Open Heart Surgery;  Laterality: N/A;    Current Outpatient Medications  Medication Sig Dispense Refill  . acetaminophen (TYLENOL) 500 MG tablet Take 2 tablets (1,000 mg total) by mouth every 6 (six) hours as needed. (Patient taking differently: Take 1,000 mg by mouth every 6 (six) hours as needed for mild pain, fever or headache. ) 30 tablet 0  . amLODipine (NORVASC) 5 MG tablet Take 5 mg by mouth daily.     Marland Kitchen aspirin 81 MG tablet Take 1 tablet (81 mg total) by mouth daily. 90 tablet 1  . atorvastatin (LIPITOR) 80 MG tablet Take 80 mg by mouth at bedtime.     . beclomethasone (QVAR) 40 MCG/ACT inhaler Inhale 2 puffs into the lungs 2 (two) times daily.     . carvedilol (COREG) 12.5 MG tablet Take 1 tablet (12.5 mg total) by mouth 2 (two) times daily. 180 tablet 3  . clopidogrel (PLAVIX) 75 MG tablet Take 1 tablet (75 mg total) by mouth daily. 90 tablet 1  . docusate sodium (COLACE) 100 MG capsule Take 100 mg by mouth 2 (two) times daily as needed.     . gabapentin (NEURONTIN) 800 MG tablet Take 1,600 mg by mouth 3 (three) times daily.    Marland Kitchen lisinopril (PRINIVIL,ZESTRIL) 20 MG tablet Take 1 tablet (20 mg total) by mouth daily. 90 tablet 3  . metFORMIN (GLUCOPHAGE-XR) 500 MG 24 hr tablet Take 500 mg by mouth daily.    . nitroGLYCERIN (NITROSTAT) 0.4 MG SL tablet Place 0.4 mg under the tongue every 5 (five) minutes as needed for chest pain.    Marland Kitchen  oxyCODONE-acetaminophen (PERCOCET) 10-325 MG tablet Take 1 tablet by mouth every 4 (four) hours as needed for pain. 40 tablet 0  . pantoprazole (PROTONIX) 40 MG tablet Take 1 tablet (40 mg total) by mouth daily. 30 tablet 1  . pioglitazone (ACTOS) 15 MG tablet Take 15 mg by mouth at bedtime.     . furosemide (LASIX) 40 MG tablet Take 1 tablet (40 mg total) by mouth daily. 90 tablet 3   No current facility-administered medications for this visit.      Allergies:   Blood-group specific substance and Cortisone acetate [cortisone]   Social History:  The patient  reports that he quit smoking about 38 years ago. His smoking use included cigarettes. He has a 30.00 pack-year smoking history. he has never used smokeless tobacco. He reports that he does not drink alcohol or use drugs.   Family History:   family history includes Cancer in his father and mother; Diabetes in his brother and sister; Heart attack in  his father; Heart disease in his brother and father; Prostate cancer in his brother.    Review of Systems: Review of Systems  Constitutional: Negative.   Respiratory: Negative.   Cardiovascular: Negative.   Gastrointestinal: Negative.   Musculoskeletal: Negative.   Neurological: Negative.   Psychiatric/Behavioral: Negative.   All other systems reviewed and are negative.    PHYSICAL EXAM: VS:  BP (!) 118/58 (BP Location: Right Arm, Patient Position: Sitting, Cuff Size: Large)   Pulse 71   Ht 6' (1.829 m)   Wt 220 lb 12 oz (100.1 kg)   BMI 29.94 kg/m  , BMI Body mass index is 29.94 kg/m. GEN: Well nourished, well developed, in no acute distress  HEENT: normal  Neck: no JVD, carotid bruits, or masses Cardiac: RRR; no murmurs, rubs, or gallops,no edema  Respiratory:  clear to auscultation bilaterally, normal work of breathing GI: soft, nontender, nondistended, + BS MS: no deformity or atrophy  Skin: warm and dry, no rash Neuro:  Strength and sensation are intact Psych: euthymic  mood, full affect    Recent Labs: 06/18/2017: ALT 18; Magnesium 1.7; TSH 2.814 06/20/2017: BUN 10; Creatinine, Ser 0.99; Hemoglobin 9.6; Hemoglobin 9.7; Platelets 174; Platelets 165; Potassium 4.3; Sodium 139    Lipid Panel Lab Results  Component Value Date   CHOL 91 06/18/2017   HDL 22 (L) 06/18/2017   LDLCALC 29 06/18/2017   TRIG 198 (H) 06/18/2017      Wt Readings from Last 3 Encounters:  01/01/18 220 lb 12 oz (100.1 kg)  06/20/17 195 lb 11.2 oz (88.8 kg)  06/18/17 199 lb (90.3 kg)       ASSESSMENT AND PLAN:  Coronary artery disease of bypass graft of native heart with stable angina pectoris ([place]) - Plan: EKG 12-Lead Currently with no symptoms of angina. No further workup at this time. Continue current medication regimen.  In the future could consider starting isosorbide for recurrent chest pain symptoms, would need to wean down on some of his other medications  Benign essential HTN Blood pressure stable, possibly slightly overmedicated given occasional episodes of orthostasis We will continue current medications for now and he will call us if symptoms get worse  Type 2 diabetes mellitus with other circulatory complication, without long-term current use of insulin ([place]) Recommended he watch his weight which is up 20 pounds since previous surgery Tolerating his medications, managed by primary care Discussed low carbohydrate diet  Dyslipidemia Cholesterol is at goal on the current lipid regimen. No changes to the medications were made.  S/P CABG x 3 - Plan: EKG 12-Lead Discussed previous events leading to bypass and stent Having stable angina symptoms Declining cardiac rehab at this time  Disposition:   F/U  6 months   Total encounter time more than 25 minutes  Greater than 50% was spent in counseling and coordination of care with the patient   Orders Placed This Encounter  Procedures  . EKG 12-Lead     Signed, Esmond Plants, M.D., Ph.D. 01/01/2018  [place], [place]

## 2018-01-01 ENCOUNTER — Encounter: Payer: Self-pay | Admitting: Cardiovascular Disease

## 2018-01-01 ENCOUNTER — Ambulatory Visit: Payer: Medicare PPO | Admitting: Cardiovascular Disease

## 2018-01-01 VITALS — BP 118/58 | HR 71 | Ht 72.0 in | Wt 220.8 lb

## 2018-01-01 DIAGNOSIS — I10 Essential (primary) hypertension: Secondary | ICD-10-CM

## 2018-01-01 DIAGNOSIS — E785 Hyperlipidemia, unspecified: Secondary | ICD-10-CM | POA: Diagnosis not present

## 2018-01-01 DIAGNOSIS — I25708 Atherosclerosis of coronary artery bypass graft(s), unspecified, with other forms of angina pectoris: Secondary | ICD-10-CM | POA: Diagnosis not present

## 2018-01-01 DIAGNOSIS — Z951 Presence of aortocoronary bypass graft: Secondary | ICD-10-CM

## 2018-01-01 DIAGNOSIS — E1159 Type 2 diabetes mellitus with other circulatory complications: Secondary | ICD-10-CM

## 2018-01-01 MED ORDER — ATORVASTATIN CALCIUM 80 MG PO TABS: 80.0000 mg | ORAL_TABLET | Freq: Every day | ORAL | 3 refills | Status: DC

## 2018-01-01 MED ORDER — AMLODIPINE BESYLATE 5 MG PO TABS: 5.0000 mg | ORAL_TABLET | Freq: Every day | ORAL | 3 refills | Status: DC

## 2018-01-01 MED ORDER — LISINOPRIL 20 MG PO TABS: 20.0000 mg | ORAL_TABLET | Freq: Every day | ORAL | 3 refills | Status: DC

## 2018-01-01 MED ORDER — CARVEDILOL 12.5 MG PO TABS: 12.5000 mg | ORAL_TABLET | Freq: Two times a day (BID) | ORAL | 3 refills | Status: DC

## 2018-01-01 MED ORDER — CLOPIDOGREL BISULFATE 75 MG PO TABS: 75.0000 mg | ORAL_TABLET | Freq: Every day | ORAL | 3 refills | Status: DC

## 2018-01-01 MED ORDER — FUROSEMIDE 40 MG PO TABS: 40.0000 mg | ORAL_TABLET | Freq: Every day | ORAL | 3 refills | Status: DC

## 2018-01-01 NOTE — Patient Instructions (Signed)

## 2018-01-01 NOTE — Addendum Note (Signed)
Addended by: Valora Corporal on: 01/01/2018 09:52 AM   Modules accepted: Orders

## 2018-01-04 ENCOUNTER — Other Ambulatory Visit: Payer: Self-pay | Admitting: Specialist

## 2018-01-04 DIAGNOSIS — R918 Other nonspecific abnormal finding of lung field: Secondary | ICD-10-CM

## 2018-01-09 ENCOUNTER — Ambulatory Visit: Payer: Medicare PPO | Admitting: Orthotics

## 2018-01-23 ENCOUNTER — Ambulatory Visit (INDEPENDENT_AMBULATORY_CARE_PROVIDER_SITE_OTHER): Payer: Medicare PPO | Admitting: Orthotics

## 2018-01-23 DIAGNOSIS — M2041 Other hammer toe(s) (acquired), right foot: Secondary | ICD-10-CM | POA: Diagnosis not present

## 2018-01-23 DIAGNOSIS — M2042 Other hammer toe(s) (acquired), left foot: Secondary | ICD-10-CM

## 2018-01-23 DIAGNOSIS — E1142 Type 2 diabetes mellitus with diabetic polyneuropathy: Secondary | ICD-10-CM

## 2018-01-23 DIAGNOSIS — M2032 Hallux varus (acquired), left foot: Secondary | ICD-10-CM

## 2018-01-23 NOTE — Progress Notes (Signed)

## 2018-01-24 ENCOUNTER — Ambulatory Visit: Payer: Medicare PPO | Admitting: Urology

## 2018-01-29 ENCOUNTER — Ambulatory Visit: Payer: Medicare PPO | Admitting: Urology

## 2018-01-29 NOTE — Progress Notes (Signed)
01/30/2018 10:40 AM   [place] 1946/09/22 [id number]  Referring provider: Ellamae Sia, MD [address] [place], [place] [address]  Chief Complaint  Patient presents with  . Follow-up    HPI: Patient is a 72 year old Caucasian male with erectile dysfunction who presents today for a yearly follow up.  His SHIM score is 9, which is moderate ED.   His previous SHIM score was 7.  He has been having difficulty with erections for over ten years.   His major complaint is achieving.  His libido is preserved.   His risk factors for ED are age, BPH, HTN, CAD, DM and blood pressure medications.  He denies any painful erections or curvatures with his erections.   He is currently using Trimix injections.  He is satisfied with the Trimix (papaverine 30 mg, phentolamine 1 mg, prostaglandin E1 10 mcg). SHIM    Row Name 01/30/18 1005         SHIM: Over the last 6 months:   How do you rate your confidence that you could get and keep an erection?  Low     When you had erections with sexual stimulation, how often were your erections hard enough for penetration (entering your partner)?  Almost Never or Never     During sexual intercourse, how often were you able to maintain your erection after you had penetrated (entered) your partner?  Almost Never or Never     During sexual intercourse, how difficult was it to maintain your erection to completion of intercourse?  Extremely Difficult     When you attempted sexual intercourse, how often was it satisfactory for you?  Most Times (much more than half the time)       SHIM Total Score   SHIM  9        Score: 1-7 Severe ED 8-11 Moderate ED 12-16 Mild-Moderate ED 17-21 Mild ED 22-25 No ED  PMH: Past Medical History:  Diagnosis Date  . Arthritis   . Chronic pain syndrome   . Congestive heart failure ([place]) 1980  . Coronary artery disease    a. s/p 3V CABG (03/2017). b. NSTEMI 06/2017 with progressive graft disease, s/p DES  to SVG-OM.  . Diabetes ([place])    DIET  . Dyspnea    with exertion  . ED (erectile dysfunction)   . GERD (gastroesophageal reflux disease)   . Gout   . Headache   . Hyperlipemia   . Hypertension    CONTROLLED ON MEDS  . Neuropathy of both feet   . Seborrheic keratosis   . Sinus congestion   . Wears dentures    PARTIAL UPPER    Surgical History: Past Surgical History:  Procedure Laterality Date  . COLONOSCOPY WITH PROPOFOL N/A 10/25/2015   Procedure: COLONOSCOPY WITH PROPOFOL;  Surgeon: Lucilla Lame, MD;  Location: [place];  Service: Endoscopy;  Laterality: N/A;  DIABETIC-ORAL MEDS  . CORONARY ARTERY BYPASS GRAFT N/A 04/10/2017   Procedure: CORONARY ARTERY BYPASS GRAFTING (CABG) x three , using left internal mammary artery and right leg greater saphenous vein harvested endoscopically;  Surgeon: Ivin Poot, MD;  Location: [place];  Service: Open Heart Surgery;  Laterality: N/A;  . CORONARY ARTERY BYPASS GRAFT    . CORONARY STENT INTERVENTION N/A 06/19/2017   Procedure: Coronary Stent Intervention;  Surgeon: Jettie Booze, MD;  Location: [place] CV LAB;  Service: Cardiovascular;  Laterality: N/A;  . KNEE SURGERY Right  over 20 years ago  . LEFT HEART CATH AND CORONARY ANGIOGRAPHY Left 02/22/2017   Procedure: Left Heart Cath and Coronary Angiography;  Surgeon: Corey Skains, MD;  Location: [place] CV LAB;  Service: Cardiovascular;  Laterality: Left;  . LEFT HEART CATH AND CORS/GRAFTS ANGIOGRAPHY N/A 06/18/2017   Procedure: Left Heart Cath and Cors/Grafts Angiography and PCI;  Surgeon: Isaias Cowman, MD;  Location: [place] CV LAB;  Service: Cardiovascular;  Laterality: N/A;  . POLYPECTOMY  10/25/2015   Procedure: POLYPECTOMY;  Surgeon: Lucilla Lame, MD;  Location: [place];  Service: Endoscopy;;  . ROTATOR CUFF REPAIR Right 2012  . TEE WITHOUT CARDIOVERSION N/A 04/10/2017   Procedure: TRANSESOPHAGEAL ECHOCARDIOGRAM (TEE);  Surgeon:  Ivin Poot, MD;  Location: [place];  Service: Open Heart Surgery;  Laterality: N/A;    Home Medications:  Allergies as of 01/30/2018      Reactions   Blood-group Specific Substance Other (See Comments)   Patient received almost 2 units FFP during a procedure. Became hypotensive and developed rash   Cortisone Acetate [cortisone] Rash, Other (See Comments)      Medication List        Accurate as of 01/30/18 10:40 AM. Always use your most recent med list.          acetaminophen 500 MG tablet Commonly known as:  TYLENOL Take 2 tablets (1,000 mg total) by mouth every 6 (six) hours as needed.   amLODipine 5 MG tablet Commonly known as:  NORVASC Take 1 tablet (5 mg total) by mouth daily.   aspirin EC 81 MG tablet Take 1 tablet (81 mg total) by mouth daily.   atorvastatin 80 MG tablet Commonly known as:  LIPITOR Take 1 tablet (80 mg total) by mouth at bedtime.   beclomethasone 40 MCG/ACT inhaler Commonly known as:  QVAR Inhale 2 puffs into the lungs 2 (two) times daily.   carvedilol 12.5 MG tablet Commonly known as:  COREG Take 1 tablet (12.5 mg total) by mouth 2 (two) times daily.   clopidogrel 75 MG tablet Commonly known as:  PLAVIX Take 1 tablet (75 mg total) by mouth daily.   docusate sodium 100 MG capsule Commonly known as:  COLACE Take 100 mg by mouth 2 (two) times daily as needed.   furosemide 40 MG tablet Commonly known as:  LASIX Take 1 tablet (40 mg total) by mouth daily.   gabapentin 800 MG tablet Commonly known as:  NEURONTIN Take 1,600 mg by mouth 3 (three) times daily.   lisinopril 20 MG tablet Commonly known as:  PRINIVIL,ZESTRIL Take 1 tablet (20 mg total) by mouth daily.   metFORMIN 500 MG 24 hr tablet Commonly known as:  GLUCOPHAGE-XR Take 500 mg by mouth daily.   nitroGLYCERIN 0.4 MG SL tablet Commonly known as:  NITROSTAT Place 0.4 mg under the tongue every 5 (five) minutes as needed for chest pain.   oxyCODONE-acetaminophen 10-325 MG  tablet Commonly known as:  PERCOCET Take 1 tablet by mouth every 4 (four) hours as needed for pain.   pantoprazole 40 MG tablet Commonly known as:  PROTONIX Take 1 tablet (40 mg total) by mouth daily.   pioglitazone 15 MG tablet Commonly known as:  ACTOS Take 15 mg by mouth at bedtime.       Allergies:  Allergies  Allergen Reactions  . Blood-Group Specific Substance Other (See Comments)    Patient received almost 2 units FFP during a procedure. Became hypotensive and developed rash  .  Cortisone Acetate [Cortisone] Rash and Other (See Comments)    Family History: Family History  Problem Relation Age of Onset  . Cancer Father   . Heart disease Father   . Heart attack Father   . Cancer Mother   . Heart disease Brother   . Diabetes Brother   . Prostate cancer Brother   . Diabetes Sister   . Bladder Cancer Neg Hx   . Kidney disease Neg Hx     Social History:  reports that he quit smoking about 38 years ago. His smoking use included cigarettes. He has a 30.00 pack-year smoking history. he has never used smokeless tobacco. He reports that he does not drink alcohol or use drugs.  ROS: UROLOGY Frequent Urination?: No Hard to postpone urination?: No Burning/pain with urination?: No Get up at night to urinate?: No Leakage of urine?: No Urine stream starts and stops?: No Trouble starting stream?: No Do you have to strain to urinate?: No Blood in urine?: No Urinary tract infection?: No Sexually transmitted disease?: No Injury to kidneys or bladder?: No Painful intercourse?: No Weak stream?: No Erection problems?: No Penile pain?: No  Gastrointestinal Nausea?: No Vomiting?: No Indigestion/heartburn?: No Diarrhea?: No Constipation?: No  Constitutional Fever: No Night sweats?: No Weight loss?: No Fatigue?: No  Skin Skin rash/lesions?: No Itching?: No  Eyes Blurred vision?: No Double vision?: No  Ears/Nose/Throat Sore throat?: No Sinus problems?:  No  Hematologic/Lymphatic Swollen glands?: No Easy bruising?: No  Cardiovascular Leg swelling?: No Chest pain?: No  Respiratory Cough?: No Shortness of breath?: No  Endocrine Excessive thirst?: No  Musculoskeletal Back pain?: No Joint pain?: No  Neurological Headaches?: No Dizziness?: No  Psychologic Depression?: No Anxiety?: No  Physical Exam: BP 110/62   Pulse 69   Resp 14   Ht 6' (1.829 m)   Wt 220 lb (99.8 kg)   BMI 29.84 kg/m   Constitutional: Well nourished. Alert and oriented, No acute distress. HEENT: [place] AT, moist mucus membranes. Trachea midline, no masses. Cardiovascular: No clubbing, cyanosis, or edema. Respiratory: Normal respiratory effort, no increased work of breathing. GI: Abdomen is soft, non tender, non distended, no abdominal masses. Liver and spleen not palpable.  No hernias appreciated.  Stool sample for occult testing is not indicated.   GU: No CVA tenderness.  No bladder fullness or masses.  Patient with circumcised phallus.   Urethral meatus is patent.  No penile discharge. No penile lesions or rashes. Scrotum without lesions, cysts, rashes and/or edema.  Testicles are located scrotally bilaterally. No masses are appreciated in the testicles. Left and right epididymis are normal. Rectal: Patient with  normal sphincter tone. Anus and perineum without scarring or rashes. No rectal masses are appreciated. Prostate is approximately 45 grams, no nodules are appreciated. Seminal vesicles are normal. Skin: No rashes, bruises or suspicious lesions. Lymph: No cervical or inguinal adenopathy. Neurologic: Grossly intact, no focal deficits, moving all 4 extremities. Psychiatric: Normal mood and affect.  Laboratory Data: Lab Results  Component Value Date   WBC 5.1 06/20/2017   WBC 5.1 06/20/2017   HGB 9.6 (L) 06/20/2017   HGB 9.7 (L) 06/20/2017   HCT 32.1 (L) 06/20/2017   HCT 31.5 (L) 06/20/2017   MCV 82.5 06/20/2017   MCV 82.5 06/20/2017   PLT  174 06/20/2017   PLT 165 06/20/2017    Lab Results  Component Value Date   CREATININE 0.99 06/20/2017     Lab Results  Component Value Date   AST 34  06/18/2017   Lab Results  Component Value Date   ALT 18 06/18/2017   I have reviewed the labs.   Assessment & Plan:    1. Erectile dysfunction  - SHIM score is 9, it is improving   - I explained to the patient that in order to achieve an erection it takes good functioning of the nervous system (parasympathetic, sympathetic, sensory and motor), good blood flow into the erectile tissue of the penis and a desire to have sex  - I explained that conditions like diabetes, hypertension, coronary artery disease, peripheral vascular disease, smoking, alcohol consumption, age, sleep apnea and BPH can diminish the ability to have an erection  - Continue Trimix; refill sent to [facility]  - RTC in 12 months for repeat SHIM score and exam   2. PSA screening  - PCP monitors PSA   Return in about 1 year (around 01/30/2019) for SHIM and exam .  These notes generated with voice recognition software. I apologize for typographical errors.  Zara Council, [facility] [address], [place], [place] [address] [phone]

## 2018-01-30 ENCOUNTER — Ambulatory Visit: Payer: Medicare PPO | Admitting: Urology

## 2018-01-30 ENCOUNTER — Encounter: Payer: Self-pay | Admitting: Urology

## 2018-01-30 ENCOUNTER — Telehealth: Payer: Self-pay | Admitting: Urology

## 2018-01-30 VITALS — BP 110/62 | HR 69 | Resp 14 | Ht 72.0 in | Wt 220.0 lb

## 2018-01-30 DIAGNOSIS — N529 Male erectile dysfunction, unspecified: Secondary | ICD-10-CM | POA: Diagnosis not present

## 2018-01-30 DIAGNOSIS — Z125 Encounter for screening for malignant neoplasm of prostate: Secondary | ICD-10-CM

## 2018-01-30 NOTE — Telephone Encounter (Signed)
Please renew his Trimix prescription, but he would like the low dose Trimix, not the high dose.

## 2018-02-04 NOTE — Telephone Encounter (Signed)
Low dose called into [facility].

## 2018-03-01 ENCOUNTER — Telehealth: Payer: Self-pay | Admitting: Cardiovascular Disease

## 2018-03-01 MED ORDER — NITROGLYCERIN 0.4 MG SL SUBL: 0.4000 mg | SUBLINGUAL_TABLET | SUBLINGUAL | 3 refills | Status: DC | PRN

## 2018-03-01 NOTE — Telephone Encounter (Signed)
Pt c/o of Chest Pain: STAT if CP now or developed within 24 hours  1. Are you having CP right now? Not right now   2. Are you experiencing any other symptoms (ex. SOB, nausea, vomiting, sweating)?   3. How long have you been experiencing CP? Since last night  4. Is your CP continuous or coming and going? Constant until he took the nitro   5. Have you taken Nitroglycerin? Yes, took two last night   ? He states his brother passed away this week and not sure if this is related to this  Please advise

## 2018-03-01 NOTE — Telephone Encounter (Signed)
Spoke with patient and he reports that his brother passed away on 03/28/2023 and he had episode of chest pain. He reports using one nitroglycerin pill and it did help pain go away. He then stated that yesterday was his funeral and he had return episodes of chest pain and took 2 nitroglycerin which did help with his pain again. He also states that he has had a cold and has just not felt well. Reviewed signs and symptoms that would require him to go to ED for evaluation. Patient also requested refills for his nitroglycerin and sent that in to his pharmacy. He verbalized understanding of our conversation, agreement with plan, and had no further questions. Confirmed appointment for next week with Dr. Rockey Situ.

## 2018-03-01 NOTE — Telephone Encounter (Signed)
For chest pain options include --- Stress test if he thinks there is blockages --- Cardiac rehab to condition his heart, previously declined ----Starting isosorbide/Imdur if he continues to take nitroglycerin for stable angina symptoms

## 2018-03-02 IMAGING — CR DG CHEST 2V
2 series · 2 of 2 positions shown · non-contrast
Comparison: 04/13/2017

CLINICAL DATA: History of CABG.

EXAM:
CHEST  2 VIEW

[chest pa]
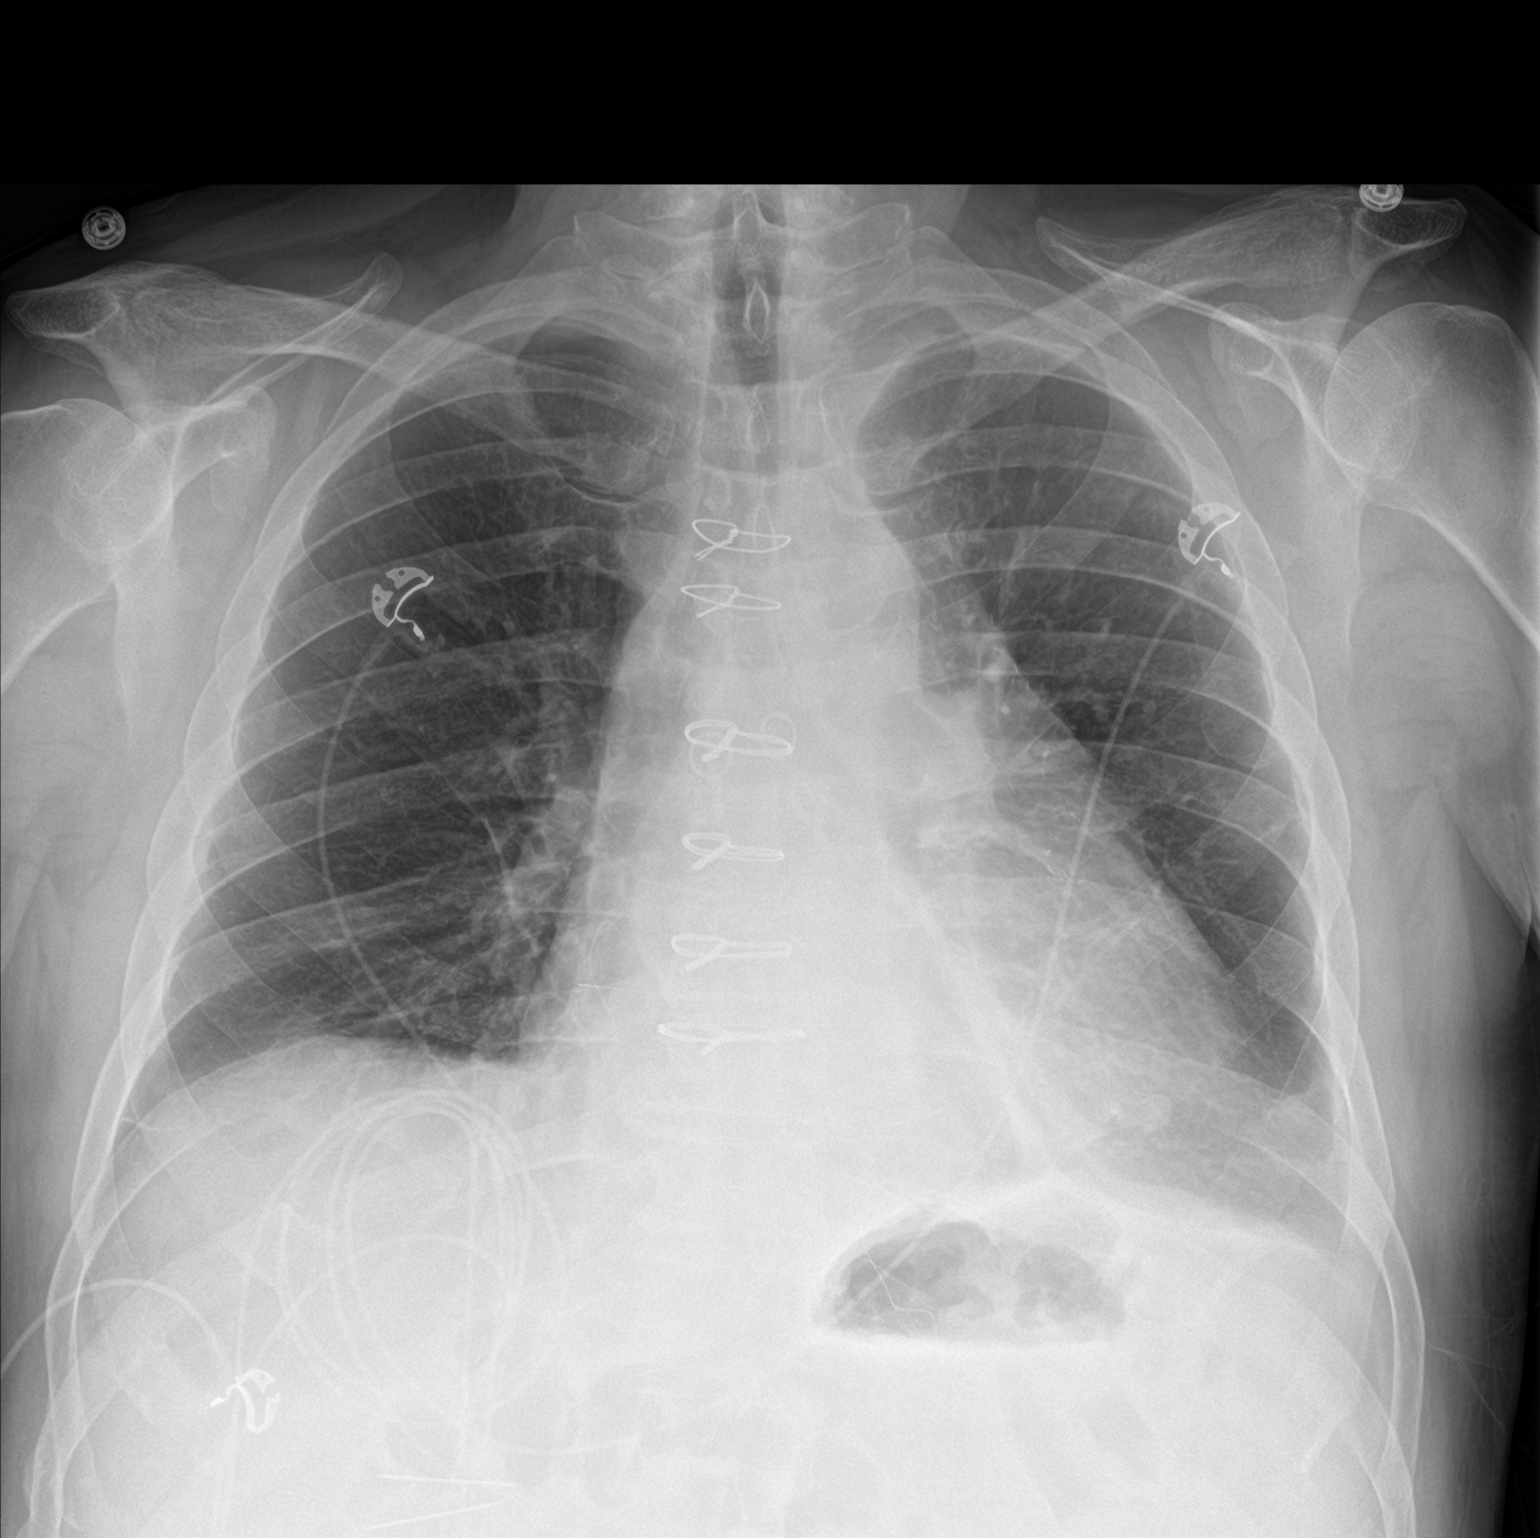

[chest lat]
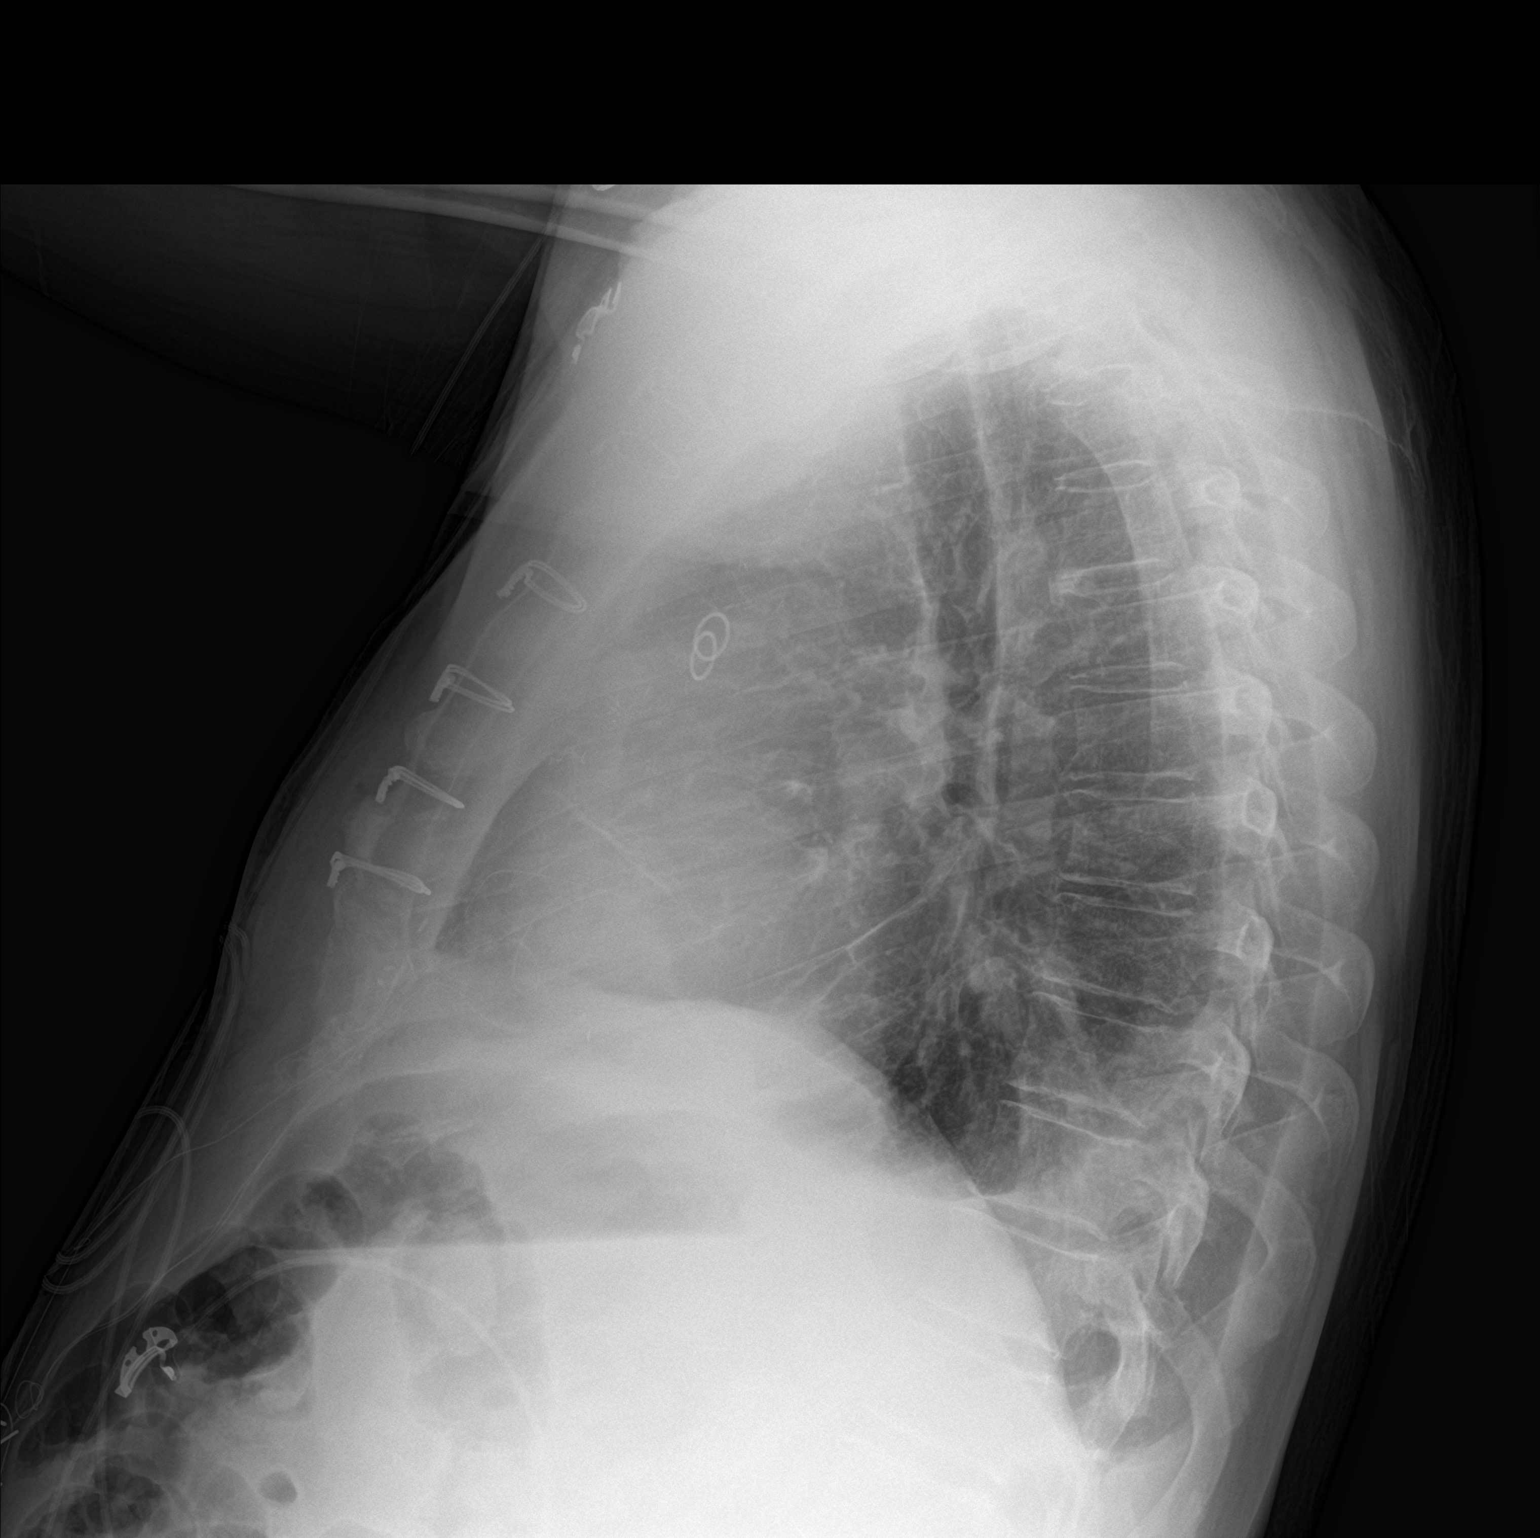

[2 of 2 positions shown; findings below may reference images not displayed]

FINDINGS: Postsurgical changes from CABG, stable.

Cardiomediastinal silhouette is stably enlarged. Mediastinal
contours appear intact.

There is no evidence of pneumothorax. There is small left pleural
effusion. Mild pulmonary vascular congestion without overt edema.
Left lower thorax peribronchial airspace opacities.

Osseous structures are without acute abnormality. Soft tissues are
grossly normal.
IMPRESSION: Small left pleural effusion and mild pulmonary vascular congestion.

Low lung volumes.

Linear peribronchial opacities in the left lower thorax may
represent atelectasis versus peribronchial airspace consolidation.

## 2018-03-04 NOTE — Telephone Encounter (Signed)
Spoke with patient and he denies any pain since last week. He really thinks that the pain started due to the death of his brother. Confirmed appointment for Thursday and patient verbalized understanding with no further questions at this time. Reviewed signs and symptoms to monitor for that would need immediate evaluation in the ED. He denied any other concerns at this time.

## 2018-03-05 NOTE — Progress Notes (Signed)
Cardiology Office Note  Date:  03/07/2018   ID:  Kenneth Duarte [place], DOB 05-29-1946, MRN [id number]  PCP:  Kenneth Sia, MD   Chief Complaint  Patient presents with  . other    Pt. c/o chest pain, took 2  NTG tablets last Thursday and the pain did ease up. He feels the symptoms are due to the death of his brother. Pt. c/o sodium decreased per PCP and has a cold, cough and congestion, was at PCP on Monday & was given an antibiotic but was told to wait a day before getting it and still hasn't had antibiotic yet due to Rx sent to mail order.      HPI:  Kenneth Duarte is a 72 year old gentleman with a history of Smoker quit 1980 CAD, CABG  in 03/2017.  3V CABG with LIMA to LAD, SVG to OM, and SVG to PDA by Dr. Nils Pyle Chest pain, transferred to [facility] Had stent placed to ostial vein graft to OM July 2018 Who presents for follow-up of his coronary disease, CABG  Having bronchitis for the past week or more Having trouble getting ABX from [facility] Sent into mail order, has not arrived yet Symptoms are getting worse  Epsiode of chest pain After brother passed away Took 2 NTG Symptoms resolved without intervention  Was told by primary care that his sodium low Details unavailable, labs have been requested Takes lasix daily Drinks 4 to 5 glasses of crystal light daily  Previously reported having occasional episodes of chest pain with heavy exertion, symptoms consistent with stable angina  EKG personally reviewed by myself on todays visit Shows normal sinus rhythm rate 69 bpm consider old inferior MI, nonspecific ST abnormality V6, 1 and aVL  Other past medical history reviewed ST depression in lateral leads and elevated troponin @ 1.57--> 6.41. 3V CAD with 75% occl LIMA-->LAD near anastomotic site, occl SVG--> PDA and 95% occl SVG--> OM1 and 95% occl of native vessel distal to anastomotic site.  Cardiac catheterization July 2018 1. Severe three-vessel coronary artery disease with  heavily calcified, eccentric 50-60% stenosis distal left main, occluded proximal left circumflex, subtotal mid RCA. 2. Patent LIMA to mid LAD with discrete 75% stenosis near anastomotic site 3. Occluded SVG to PDA 4. Patent SVG to OM1 with a long diffuse 95% stenosis proximal segment of graft, with near 60-70% stenosis at the anastomotic site, 95% stenosis in the native vessel distal to the anastomotic site, with retrograde filling of left circumflex 5. Mildly reduced left ventricular function, with inferior-posterior wall akinesis  Transferred to [facility] Had stent placed to ostial vein graft to OM Dist Graft lesion, 75 %stenosed. There is more disease noted past the graft insertion in the OM and in the distal circumflex.              PMH:   has a past medical history of Arthritis, Chronic pain syndrome, Congestive heart failure ([place]) (1980), Coronary artery disease, Diabetes ([place]), Dyspnea, ED (erectile dysfunction), GERD (gastroesophageal reflux disease), Gout, Headache, Hyperlipemia, Hypertension, Neuropathy of both feet, Seborrheic keratosis, Sinus congestion, and Wears dentures.  PSH:    Past Surgical History:  Procedure Laterality Date  . COLONOSCOPY WITH PROPOFOL N/A 10/25/2015   Procedure: COLONOSCOPY WITH PROPOFOL;  Surgeon: Kenneth Lame, MD;  Location: [place];  Service: Endoscopy;  Laterality: N/A;  DIABETIC-ORAL MEDS  . CORONARY ARTERY BYPASS GRAFT N/A 04/10/2017   Procedure: CORONARY ARTERY BYPASS GRAFTING (CABG) x three , using left internal mammary artery  and right leg greater saphenous vein harvested endoscopically;  Surgeon: Kenneth Poot, MD;  Location: [place];  Service: Open Heart Surgery;  Laterality: N/A;  . CORONARY ARTERY BYPASS GRAFT    . CORONARY STENT INTERVENTION N/A 06/19/2017   Procedure: Coronary Stent Intervention;  Surgeon: Kenneth Booze, MD;  Location: [place] [facility];  Service: Cardiovascular;  Laterality: N/A;  . KNEE SURGERY Right     over 20 years ago  . LEFT HEART CATH AND CORONARY ANGIOGRAPHY Left 02/22/2017   Procedure: Left Heart Cath and Coronary Angiography;  Surgeon: Kenneth Skains, MD;  Location: [place] [facility];  Service: Cardiovascular;  Laterality: Left;  . LEFT HEART CATH AND CORS/GRAFTS ANGIOGRAPHY N/A 06/18/2017   Procedure: Left Heart Cath and Cors/Grafts Angiography and PCI;  Surgeon: Kenneth Cowman, MD;  Location: [place] [facility];  Service: Cardiovascular;  Laterality: N/A;  . POLYPECTOMY  10/25/2015   Procedure: POLYPECTOMY;  Surgeon: Kenneth Lame, MD;  Location: [place];  Service: Endoscopy;;  . ROTATOR CUFF REPAIR Right 2012  . TEE WITHOUT CARDIOVERSION N/A 04/10/2017   Procedure: TRANSESOPHAGEAL ECHOCARDIOGRAM (TEE);  Surgeon: Kenneth Poot, MD;  Location: [place];  Service: Open Heart Surgery;  Laterality: N/A;    Current Outpatient Medications  Medication Sig Dispense Refill  . acetaminophen (TYLENOL) 500 MG tablet Take 2 tablets (1,000 mg total) by mouth every 6 (six) hours as needed. (Patient taking differently: Take 1,000 mg by mouth every 6 (six) hours as needed for mild pain, fever or headache. ) 30 tablet 0  . amLODipine (NORVASC) 5 MG tablet Take 1 tablet (5 mg total) by mouth daily. 90 tablet 3  . aspirin 81 MG tablet Take 1 tablet (81 mg total) by mouth daily. 90 tablet 1  . atorvastatin (LIPITOR) 80 MG tablet Take 1 tablet (80 mg total) by mouth at bedtime. 90 tablet 3  . beclomethasone (QVAR) 40 MCG/ACT inhaler Inhale 2 puffs into the lungs 2 (two) times daily.     . carvedilol (COREG) 12.5 MG tablet Take 1 tablet (12.5 mg total) by mouth 2 (two) times daily. 180 tablet 3  . clopidogrel (PLAVIX) 75 MG tablet Take 1 tablet (75 mg total) by mouth daily. 90 tablet 3  . docusate sodium (COLACE) 100 MG capsule Take 100 mg by mouth 2 (two) times daily as needed.     . furosemide (LASIX) 40 MG tablet Take 1 tablet (40 mg total) by mouth daily. 90 tablet 3  . gabapentin  (NEURONTIN) 800 MG tablet Take 1,600 mg by mouth 3 (three) times daily.    Marland Kitchen lisinopril (PRINIVIL,ZESTRIL) 20 MG tablet Take 1 tablet (20 mg total) by mouth daily. 90 tablet 3  . metFORMIN (GLUCOPHAGE-XR) 500 MG 24 hr tablet Take 500 mg by mouth daily.    . nitroGLYCERIN (NITROSTAT) 0.4 MG SL tablet Place 1 tablet (0.4 mg total) under the tongue every 5 (five) minutes as needed for chest pain. 25 tablet 3  . oxyCODONE-acetaminophen (PERCOCET) 10-325 MG tablet Take 1 tablet by mouth every 4 (four) hours as needed for pain. 40 tablet 0  . pantoprazole (PROTONIX) 40 MG tablet Take 1 tablet (40 mg total) by mouth daily. 30 tablet 1  . pioglitazone (ACTOS) 15 MG tablet Take 15 mg by mouth at bedtime.     Marland Kitchen doxycycline (VIBRAMYCIN) 100 MG capsule Take 1 capsule (100 mg total) by mouth 2 (two) times daily. 20 capsule 0   No current facility-administered medications for  this visit.      Allergies:   Blood-group specific substance and Cortisone acetate [cortisone]   Social History:  The patient  reports that he quit smoking about 38 years ago. His smoking use included cigarettes. He has a 30.00 pack-year smoking history. He has never used smokeless tobacco. He reports that he does not drink alcohol or use drugs.   Family History:   family history includes Cancer in his father and mother; Diabetes in his brother and sister; Heart attack in his father; Heart disease in his brother and father; Prostate cancer in his brother.    Review of Systems: Review of Systems  Constitutional: Negative.   Respiratory: Positive for cough and shortness of breath.   Cardiovascular: Positive for chest pain.  Gastrointestinal: Negative.   Musculoskeletal: Negative.   Neurological: Negative.   Psychiatric/Behavioral: Negative.   All other systems reviewed and are negative.    PHYSICAL EXAM: VS:  BP 140/62 (BP Location: Left Arm, Patient Position: Sitting, Cuff Size: Normal)   Pulse 69   Ht 6' (1.829 m)   Wt  220 lb 12 oz (100.1 kg)   BMI 29.94 kg/m  , BMI Body mass index is 29.94 kg/m. Constitutional:  oriented to person, place, and time. No distress. Significant coughing on today's visit HENT:  Head: Normocephalic and atraumatic.  Eyes:  no discharge. No scleral icterus.  Neck: Normal range of motion. Neck supple. No JVD present.  Cardiovascular: Normal rate, regular rhythm, normal heart sounds and intact distal pulses. Exam reveals no gallop and no friction rub. No edema No murmur heard. Pulmonary/Chest: Effort normal and breath sounds normal. No stridor. No respiratory distress.  no wheezes.  no rales.  no tenderness.  Abdominal: Soft.  no distension.  no tenderness.  Musculoskeletal: Normal range of motion.  no  tenderness or deformity.  Neurological:  normal muscle tone. Coordination normal. No atrophy Skin: Skin is warm and dry. No rash noted. not diaphoretic.  Psychiatric:  normal mood and affect. behavior is normal. Thought content normal.      Recent Labs: 06/18/2017: ALT 18; Magnesium 1.7; TSH 2.814 06/20/2017: BUN 10; Creatinine, Ser 0.99; Hemoglobin 9.6; Hemoglobin 9.7; Platelets 174; Platelets 165; Potassium 4.3; Sodium 139    Lipid Panel Lab Results  Component Value Date   CHOL 91 06/18/2017   HDL 22 (L) 06/18/2017   LDLCALC 29 06/18/2017   TRIG 198 (H) 06/18/2017      Wt Readings from Last 3 Encounters:  03/07/18 220 lb 12 oz (100.1 kg)  01/30/18 220 lb (99.8 kg)  01/01/18 220 lb 12 oz (100.1 kg)       ASSESSMENT AND PLAN:  Coronary artery disease of bypass graft of native heart with stable angina pectoris ([place]) - Plan: EKG 12-Lead Continues to take nitroglycerin as needed for episodes of chest pain We did discuss restarting his isosorbide mononitrate 30 mg daily He would like to keep things as they are for now and will call us if symptoms get worse  Benign essential HTN Blood pressure is well controlled on today's visit. No changes made to the  medications. Denies orthostasis symptoms  Type 2 diabetes mellitus with other circulatory complication, without long-term current use of insulin ([place]) Managed by primary care Only on metformin, reports sugars are running okay  Dyslipidemia Cholesterol is at goal on the current lipid regimen. No changes to the medications were made.  Stable  S/P CABG x 3 - Plan: EKG 12-Lead Having stable angina symptoms Declining cardiac  rehab at this time Will start isosorbide mononitrate if symptoms get worse  Acute bronchitis Symptoms getting worse, now more than 7 days Did not receive abx from primary care although they were ordered We will send in doxycycline 100 mg twice daily 10 days  Disposition:   F/U  12 months   Total encounter time more than 25 minutes  Greater than 50% was spent in counseling and coordination of care with the patient   Orders Placed This Encounter  Procedures  . EKG 12-Lead     Signed, Esmond Plants, M.D., Ph.D. 03/07/2018  [place], [place]

## 2018-03-07 ENCOUNTER — Ambulatory Visit: Payer: Medicare PPO | Admitting: Cardiovascular Disease

## 2018-03-07 ENCOUNTER — Encounter: Payer: Self-pay | Admitting: Cardiovascular Disease

## 2018-03-07 VITALS — BP 140/62 | HR 69 | Ht 72.0 in | Wt 220.8 lb

## 2018-03-07 DIAGNOSIS — J440 Chronic obstructive pulmonary disease with acute lower respiratory infection: Secondary | ICD-10-CM | POA: Diagnosis not present

## 2018-03-07 DIAGNOSIS — I10 Essential (primary) hypertension: Secondary | ICD-10-CM

## 2018-03-07 DIAGNOSIS — Z951 Presence of aortocoronary bypass graft: Secondary | ICD-10-CM

## 2018-03-07 DIAGNOSIS — I25118 Atherosclerotic heart disease of native coronary artery with other forms of angina pectoris: Secondary | ICD-10-CM | POA: Diagnosis not present

## 2018-03-07 DIAGNOSIS — E785 Hyperlipidemia, unspecified: Secondary | ICD-10-CM

## 2018-03-07 DIAGNOSIS — J209 Acute bronchitis, unspecified: Secondary | ICD-10-CM

## 2018-03-07 DIAGNOSIS — I6523 Occlusion and stenosis of bilateral carotid arteries: Secondary | ICD-10-CM | POA: Diagnosis not present

## 2018-03-07 DIAGNOSIS — E1159 Type 2 diabetes mellitus with other circulatory complications: Secondary | ICD-10-CM | POA: Diagnosis not present

## 2018-03-07 MED ORDER — NITROGLYCERIN 0.4 MG SL SUBL: 0.4000 mg | SUBLINGUAL_TABLET | SUBLINGUAL | 3 refills | Status: DC | PRN

## 2018-03-07 MED ORDER — DOXYCYCLINE HYCLATE 100 MG PO CAPS: 100.0000 mg | ORAL_CAPSULE | Freq: Two times a day (BID) | ORAL | 0 refills | Status: DC

## 2018-03-07 NOTE — Patient Instructions (Addendum)
We will try to get labs from [facility]  Medication Instructions:  Your physician has recommended you make the following change in your medication:  1. START Doxycycline 100 mg twice a day for 10 days   Call us if you would like a long acting nitro(isosorbide)  Labwork:  No new labs needed  Testing/Procedures:  No further testing at this time   Follow-Up: It was a pleasure seeing you in the office today. Please call us if you have new issues that need to be addressed before your next appt.  [phone]  Your physician wants you to follow-up in: 12 months.  You will receive a reminder letter in the mail two months in advance. If you don't receive a letter, please call our office to schedule the follow-up appointment.  If you need a refill on your cardiac medications before your next appointment, please call your pharmacy.  For educational health videos Log in to : [email or web address] Or : [email or web address], password : [email or web address]

## 2018-04-12 ENCOUNTER — Other Ambulatory Visit: Payer: Self-pay

## 2018-04-12 MED ORDER — PANTOPRAZOLE SODIUM 40 MG PO TBEC: 40.0000 mg | DELAYED_RELEASE_TABLET | Freq: Every day | ORAL | 3 refills | Status: DC

## 2018-04-16 ENCOUNTER — Other Ambulatory Visit: Payer: Self-pay

## 2018-04-16 MED ORDER — PANTOPRAZOLE SODIUM 40 MG PO TBEC: 40.0000 mg | DELAYED_RELEASE_TABLET | Freq: Every day | ORAL | 0 refills | Status: DC

## 2018-05-14 ENCOUNTER — Ambulatory Visit
Admission: RE | Admit: 2018-05-14 | Discharge: 2018-05-14 | Disposition: A | Discharge status: Home / Self Care | Payer: Medicare PPO | Source: Ambulatory Visit | Attending: Specialist | Admitting: Specialist

## 2018-05-14 DIAGNOSIS — R918 Other nonspecific abnormal finding of lung field: Secondary | ICD-10-CM | POA: Diagnosis present

## 2018-05-14 DIAGNOSIS — I700 Atherosclerosis of aorta: Secondary | ICD-10-CM | POA: Diagnosis not present

## 2018-06-18 ENCOUNTER — Other Ambulatory Visit: Payer: Self-pay | Admitting: Specialist

## 2018-06-18 DIAGNOSIS — R918 Other nonspecific abnormal finding of lung field: Secondary | ICD-10-CM

## 2018-08-12 ENCOUNTER — Ambulatory Visit: Admission: RE | Admit: 2018-08-12 | Payer: Medicare PPO | Source: Ambulatory Visit

## 2018-08-15 ENCOUNTER — Encounter (INDEPENDENT_AMBULATORY_CARE_PROVIDER_SITE_OTHER): Payer: Self-pay

## 2018-08-15 ENCOUNTER — Ambulatory Visit
Admission: RE | Admit: 2018-08-15 | Discharge: 2018-08-15 | Disposition: A | Discharge status: Home / Self Care | Payer: Medicare PPO | Source: Ambulatory Visit | Attending: Specialist | Admitting: Specialist

## 2018-08-15 DIAGNOSIS — R918 Other nonspecific abnormal finding of lung field: Secondary | ICD-10-CM | POA: Insufficient documentation

## 2018-08-15 DIAGNOSIS — I700 Atherosclerosis of aorta: Secondary | ICD-10-CM | POA: Insufficient documentation

## 2018-08-19 ENCOUNTER — Other Ambulatory Visit: Payer: Self-pay | Admitting: Cardiovascular Disease

## 2018-10-01 ENCOUNTER — Other Ambulatory Visit: Payer: Self-pay | Admitting: Cardiovascular Disease

## 2018-11-21 ENCOUNTER — Telehealth: Payer: Self-pay

## 2018-11-21 MED ORDER — CARVEDILOL 12.5 MG PO TABS: 12.5000 mg | ORAL_TABLET | Freq: Two times a day (BID) | ORAL | 0 refills | Status: DC

## 2018-11-21 MED ORDER — LISINOPRIL 20 MG PO TABS: 20.0000 mg | ORAL_TABLET | Freq: Every day | ORAL | 0 refills | Status: DC

## 2018-11-21 NOTE — Telephone Encounter (Signed)
RX sent to [facility] for Lisinopril, and Carvedilol

## 2018-12-04 ENCOUNTER — Ambulatory Visit: Payer: Medicare PPO | Admitting: Orthotics

## 2018-12-04 DIAGNOSIS — E1142 Type 2 diabetes mellitus with diabetic polyneuropathy: Secondary | ICD-10-CM

## 2018-12-04 DIAGNOSIS — M2041 Other hammer toe(s) (acquired), right foot: Secondary | ICD-10-CM

## 2018-12-04 DIAGNOSIS — M2042 Other hammer toe(s) (acquired), left foot: Secondary | ICD-10-CM

## 2018-12-04 NOTE — Progress Notes (Signed)

## 2018-12-25 ENCOUNTER — Other Ambulatory Visit: Payer: Self-pay | Admitting: Cardiovascular Disease

## 2019-01-08 ENCOUNTER — Ambulatory Visit (INDEPENDENT_AMBULATORY_CARE_PROVIDER_SITE_OTHER): Payer: Medicare PPO | Admitting: Orthotics

## 2019-01-08 DIAGNOSIS — M2032 Hallux varus (acquired), left foot: Secondary | ICD-10-CM | POA: Diagnosis not present

## 2019-01-08 DIAGNOSIS — E1142 Type 2 diabetes mellitus with diabetic polyneuropathy: Secondary | ICD-10-CM | POA: Diagnosis not present

## 2019-01-08 DIAGNOSIS — M2041 Other hammer toe(s) (acquired), right foot: Secondary | ICD-10-CM

## 2019-01-08 DIAGNOSIS — M2042 Other hammer toe(s) (acquired), left foot: Secondary | ICD-10-CM | POA: Diagnosis not present

## 2019-01-08 DIAGNOSIS — M129 Arthropathy, unspecified: Secondary | ICD-10-CM

## 2019-01-08 NOTE — Progress Notes (Signed)

## 2019-01-27 ENCOUNTER — Other Ambulatory Visit: Payer: Self-pay | Admitting: Cardiovascular Disease

## 2019-01-30 ENCOUNTER — Encounter: Payer: Self-pay | Admitting: Urology

## 2019-01-30 ENCOUNTER — Ambulatory Visit: Payer: Medicare PPO | Admitting: Urology

## 2019-02-20 ENCOUNTER — Encounter: Payer: Self-pay | Admitting: Urology

## 2019-02-20 ENCOUNTER — Ambulatory Visit: Payer: Medicare PPO | Admitting: Urology

## 2019-02-20 VITALS — BP 152/74 | HR 75 | Ht 72.0 in | Wt 208.0 lb

## 2019-02-20 DIAGNOSIS — N529 Male erectile dysfunction, unspecified: Secondary | ICD-10-CM | POA: Diagnosis not present

## 2019-02-20 MED ORDER — NONFORMULARY OR COMPOUNDED ITEM: 11 refills | Status: DC

## 2019-02-20 NOTE — Progress Notes (Signed)
02/20/2019  11:15 AM   [place] 12-04-1946 [id number]  Referring provider: Ellamae Sia, MD [address] [place], [place] [address]  Chief Complaint  Patient presents with  . Erectile Dysfunction    HPI: Patient is a 73 year old Caucasian male with erectile dysfunction who presents today for a yearly follow up.  His SHIM score is 8, which is moderate ED.   His previous SHIM score was 9.  He has been having difficulty with erections for over ten years. He does not report of any issues with ED.  His libido is preserved.   His risk factors for ED are age, BPH, HTN, CAD, DM and blood pressure medications.  He denies any painful erections or curvatures with his erections.   He is currently using Trimix injections. He is satisfied with the Trimix 1cc (papaverine 30 mg, phentolamine 1 mg, prostaglandin E1 10 mcg).  His PSA is being managed by his PCP. He denies any bothersome urinary symptoms. Patient denies any gross hematuria, dysuria or suprapubic/flank pain. Patient denies any fevers, chills, nausea or vomiting.   SHIM    Row Name 02/20/19 1100         SHIM: Over the last 6 months:   How do you rate your confidence that you could get and keep an erection?  Low     When you had erections with sexual stimulation, how often were your erections hard enough for penetration (entering your partner)?  Almost Never or Never     During sexual intercourse, how often were you able to maintain your erection after you had penetrated (entered) your partner?  Almost Never or Never     During sexual intercourse, how difficult was it to maintain your erection to completion of intercourse?  Very Difficult     When you attempted sexual intercourse, how often was it satisfactory for you?  A Few Times (much less than half the time)       SHIM Total Score   SHIM  8       Score: 1-7 Severe ED 8-11 Moderate ED 12-16 Mild-Moderate ED 17-21 Mild ED 22-25 No ED  PMH: Past Medical  History:  Diagnosis Date  . Arthritis   . Chronic pain syndrome   . Congestive heart failure ([place]) 1980  . Coronary artery disease    a. s/p 3V CABG (03/2017). b. NSTEMI 06/2017 with progressive graft disease, s/p DES to SVG-OM.  . Diabetes ([place])    DIET  . Dyspnea    with exertion  . ED (erectile dysfunction)   . GERD (gastroesophageal reflux disease)   . Gout   . Headache   . Hyperlipemia   . Hypertension    CONTROLLED ON MEDS  . Neuropathy of both feet   . Seborrheic keratosis   . Sinus congestion   . Wears dentures    PARTIAL UPPER    Surgical History: Past Surgical History:  Procedure Laterality Date  . COLONOSCOPY WITH PROPOFOL N/A 10/25/2015   Procedure: COLONOSCOPY WITH PROPOFOL;  Surgeon: Lucilla Lame, MD;  Location: [place];  Service: Endoscopy;  Laterality: N/A;  DIABETIC-ORAL MEDS  . CORONARY ARTERY BYPASS GRAFT N/A 04/10/2017   Procedure: CORONARY ARTERY BYPASS GRAFTING (CABG) x three , using left internal mammary artery and right leg greater saphenous vein harvested endoscopically;  Surgeon: Ivin Poot, MD;  Location: [facility] [place];  Service: Open Heart Surgery;  Laterality: N/A;  . CORONARY ARTERY BYPASS GRAFT    .  CORONARY STENT INTERVENTION N/A 06/19/2017   Procedure: Coronary Stent Intervention;  Surgeon: Jettie Booze, MD;  Location: [place] [facility];  Service: Cardiovascular;  Laterality: N/A;  . KNEE SURGERY Right    over 20 years ago  . LEFT HEART CATH AND CORONARY ANGIOGRAPHY Left 02/22/2017   Procedure: Left Heart Cath and Coronary Angiography;  Surgeon: Corey Skains, MD;  Location: [place] [facility];  Service: Cardiovascular;  Laterality: Left;  . LEFT HEART CATH AND CORS/GRAFTS ANGIOGRAPHY N/A 06/18/2017   Procedure: Left Heart Cath and Cors/Grafts Angiography and PCI;  Surgeon: Isaias Cowman, MD;  Location: [place] [facility];  Service: Cardiovascular;  Laterality: N/A;  . POLYPECTOMY  10/25/2015   Procedure: POLYPECTOMY;   Surgeon: Lucilla Lame, MD;  Location: [place];  Service: Endoscopy;;  . ROTATOR CUFF REPAIR Right 2012  . TEE WITHOUT CARDIOVERSION N/A 04/10/2017   Procedure: TRANSESOPHAGEAL ECHOCARDIOGRAM (TEE);  Surgeon: Ivin Poot, MD;  Location: [place];  Service: Open Heart Surgery;  Laterality: N/A;    Home Medications:  Allergies as of 02/20/2019      Reactions   Blood-group Specific Substance Other (See Comments)   Patient received almost 2 units FFP during a procedure. Became hypotensive and developed rash   Cortisone Acetate [cortisone] Rash, Other (See Comments)      Medication List       Accurate as of February 20, 2019 11:15 AM. Always use your most recent med list.        acetaminophen 500 MG tablet Commonly known as:  TYLENOL Take 2 tablets (1,000 mg total) by mouth every 6 (six) hours as needed.   albuterol 108 (90 Base) MCG/ACT inhaler Commonly known as:  PROVENTIL HFA;VENTOLIN HFA Inhale into the lungs.   amLODipine 5 MG tablet Commonly known as:  NORVASC Take 1 tablet (5 mg total) by mouth daily.   aspirin EC 81 MG tablet Take 1 tablet (81 mg total) by mouth daily.   atorvastatin 80 MG tablet Commonly known as:  LIPITOR TAKE  1  TABLET  AT  BEDTIME   beclomethasone 40 MCG/ACT inhaler Commonly known as:  QVAR Inhale 2 puffs into the lungs 2 (two) times daily.   carvedilol 12.5 MG tablet Commonly known as:  COREG TAKE 1 TABLET TWICE DAILY   clopidogrel 75 MG tablet Commonly known as:  PLAVIX Take 1 tablet (75 mg total) by mouth daily.   docusate sodium 100 MG capsule Commonly known as:  COLACE Take 100 mg by mouth 2 (two) times daily as needed.   doxycycline 100 MG capsule Commonly known as:  VIBRAMYCIN Take 1 capsule (100 mg total) by mouth 2 (two) times daily.   fluticasone 50 MCG/ACT nasal spray Commonly known as:  FLONASE Place into the nose.   furosemide 40 MG tablet Commonly known as:  LASIX TAKE 1 TABLET (40 MG TOTAL) BY MOUTH DAILY.    gabapentin 800 MG tablet Commonly known as:  NEURONTIN Take 1,600 mg by mouth 3 (three) times daily.   IBU 800 MG tablet Generic drug:  ibuprofen   levofloxacin 500 MG tablet Commonly known as:  LEVAQUIN Take 500 mg by mouth daily.   lisinopril 20 MG tablet Commonly known as:  PRINIVIL,ZESTRIL TAKE 1 TABLET EVERY DAY   metFORMIN 500 MG 24 hr tablet Commonly known as:  GLUCOPHAGE-XR Take 500 mg by mouth daily.   nitroGLYCERIN 0.4 MG SL tablet Commonly known as:  NITROSTAT Place 1 tablet (0.4 mg total) under  the tongue every 5 (five) minutes as needed for chest pain.   NONFORMULARY OR COMPOUNDED ITEM Trimix (30/1/10)-(Pap/Phent/PGE) Dosage: Inject 1 cc per injection Prefilled syringe   Qty 6 Refills [facility] [phone] Fax [phone]   oxyCODONE-acetaminophen 10-325 MG tablet Commonly known as:  PERCOCET Take 1 tablet by mouth every 4 (four) hours as needed for pain.   pantoprazole 40 MG tablet Commonly known as:  PROTONIX TAKE 1 TABLET (40 MG TOTAL) BY MOUTH DAILY.   pioglitazone 15 MG tablet Commonly known as:  ACTOS Take 15 mg by mouth at bedtime.   Vitamin D (Ergocalciferol) 1.25 MG (50000 UT) Caps capsule Commonly known as:  DRISDOL Take by mouth.       Allergies:  Allergies  Allergen Reactions  . Blood-Group Specific Substance Other (See Comments)    Patient received almost 2 units FFP during a procedure. Became hypotensive and developed rash  . Cortisone Acetate [Cortisone] Rash and Other (See Comments)    Family History: Family History  Problem Relation Age of Onset  . Cancer Father   . Heart disease Father   . Heart attack Father   . Cancer Mother   . Heart disease Brother   . Diabetes Brother   . Prostate cancer Brother   . Diabetes Sister   . Bladder Cancer Neg Hx   . Kidney disease Neg Hx     Social History:  reports that he quit smoking about 39 years ago. His smoking use included cigarettes. He has a 30.00  pack-year smoking history. He has never used smokeless tobacco. He reports that he does not drink alcohol or use drugs.  ROS: UROLOGY Frequent Urination?: No Hard to postpone urination?: No Burning/pain with urination?: No Get up at night to urinate?: No Leakage of urine?: No Urine stream starts and stops?: No Trouble starting stream?: No Do you have to strain to urinate?: No Blood in urine?: No Urinary tract infection?: No Sexually transmitted disease?: No Injury to kidneys or bladder?: No Painful intercourse?: No Weak stream?: No Erection problems?: No Penile pain?: No  Gastrointestinal Nausea?: No Vomiting?: No Indigestion/heartburn?: No Diarrhea?: No Constipation?: No  Constitutional Fever: No Night sweats?: No Weight loss?: No Fatigue?: No  Skin Skin rash/lesions?: No Itching?: No  Eyes Blurred vision?: No Double vision?: No  Ears/Nose/Throat Sore throat?: No Sinus problems?: No  Hematologic/Lymphatic Swollen glands?: No Easy bruising?: No  Cardiovascular Leg swelling?: No Chest pain?: No  Respiratory Cough?: No Shortness of breath?: No  Endocrine Excessive thirst?: No  Musculoskeletal Back pain?: No Joint pain?: No  Neurological Headaches?: No Dizziness?: No  Psychologic Depression?: No Anxiety?: No  Physical Exam: BP (!) 152/74 (BP Location: Left Arm, Patient Position: Sitting)   Pulse 75   Ht 6' (1.829 m)   Wt 208 lb (94.3 kg)   BMI 28.21 kg/m   Constitutional:  Well nourished. Alert and oriented, No acute distress. HEENT: [place] AT, moist mucus membranes.  Trachea midline, no masses. Cardiovascular: No clubbing, cyanosis, or edema. Respiratory: Normal respiratory effort, no increased work of breathing. GU: No CVA tenderness.  No bladder fullness or masses.  Patient with circumcised. Urethral meatus is patent.  No penile discharge. No penile lesions or rashes. Scrotum without lesions, cysts, rashes and/or edema.  Testicles are  located scrotally bilaterally. No masses are appreciated in the testicles. Left and right epididymis are normal. Rectal: Patient with  normal sphincter tone. Anus and perineum without scarring or rashes. No rectal masses are appreciated. Prostate  is approximately 50 grams, no nodules are appreciated. Seminal vesicles are normal. Skin: No rashes, bruises or suspicious lesions. Neurologic: Grossly intact, no focal deficits, moving all 4 extremities. Psychiatric: Normal mood and affect.  Assessment & Plan:    1. Erectile dysfunction  - SHIM score is 8, it is stable    - Risk factors include diabetes, hypertension, coronary artery disease, peripheral vascular disease, smoking, alcohol consumption, age, sleep apnea and BPH can diminish the ability to have an erection  - Continue Trimix 1cc; refill sent to [facility] in 12 months for repeat SHIM score and exam   2. PSA screening  - PCP monitors PSA   Return in about 1 year (around 02/20/2020) for SHIM and exam .  These notes generated with voice recognition software. I apologize for typographical errors.  Zara Council, PA-C  [facility] [address] [place], [place] [address] [phone]  I, Lucas Mallow, am acting as a scribe for Dr. Hollice Espy,  I have reviewed the above documentation for accuracy and completeness, and I agree with the above.    Zara Council, PA-C

## 2019-02-26 ENCOUNTER — Other Ambulatory Visit: Payer: Self-pay | Admitting: Cardiovascular Disease

## 2019-03-05 ENCOUNTER — Other Ambulatory Visit: Payer: Self-pay | Admitting: Cardiovascular Disease

## 2019-03-10 ENCOUNTER — Telehealth: Payer: Self-pay | Admitting: Urology

## 2019-03-10 NOTE — Telephone Encounter (Signed)
Pt wants someone to call him back, he states that his medicine was not called in correctly.

## 2019-03-10 NOTE — Telephone Encounter (Signed)
Kenneth Duarte spoke with Kenneth Duarte who states that they are no longer able to provide patients with prefilled syringes and this is likely why the patient's cost has increased as they now have to provide him with 5 - 20mL vials. Kenneth Duarte states that RX is ready for patient he just needs to call and pay.   Called pt back informed him of all information above. Pt gave verbal understanding.

## 2019-03-12 ENCOUNTER — Other Ambulatory Visit: Payer: Self-pay | Admitting: *Deleted

## 2019-03-12 ENCOUNTER — Other Ambulatory Visit: Payer: Self-pay | Admitting: Cardiovascular Disease

## 2019-03-12 MED ORDER — AMLODIPINE BESYLATE 5 MG PO TABS: 5.0000 mg | ORAL_TABLET | Freq: Every day | ORAL | 0 refills | Status: DC

## 2019-03-13 ENCOUNTER — Other Ambulatory Visit: Payer: Self-pay | Admitting: *Deleted

## 2019-03-13 MED ORDER — CLOPIDOGREL BISULFATE 75 MG PO TABS: 75.0000 mg | ORAL_TABLET | Freq: Every day | ORAL | 0 refills | Status: DC

## 2019-03-13 NOTE — Telephone Encounter (Signed)
clopidogrel (PLAVIX) 75 MG tablet 90 tablet 0 03/13/2019    Sig - Route: Take 1 tablet (75 mg total) by mouth daily. - Oral   Sent to pharmacy as: clopidogrel (PLAVIX) 75 MG tablet   E-Prescribing Status: Receipt confirmed by pharmacy (03/13/2019 8:38 AM EDT)   Pharmacy   [facility] [place], [place]

## 2019-03-18 ENCOUNTER — Telehealth: Payer: Self-pay

## 2019-03-18 NOTE — Telephone Encounter (Signed)
LMOV for patient to call back.  Need to change appointment to Telephone or video visit.

## 2019-03-20 ENCOUNTER — Telehealth: Payer: Self-pay

## 2019-03-20 NOTE — Telephone Encounter (Signed)
Virtual Visit Pre-Appointment Phone Call  Steps For Call:  1. Confirm consent - "In the setting of the current Covid19 crisis, you are scheduled for a TELEPHONE visit with your provider on 04/08/2019 at 8:00AM.  Just as we do with many in-office visits, in order for you to participate in this visit, we must obtain consent.  If you'd like, I can send this to your mychart (if signed up) or email for you to review.  Otherwise, I can obtain your verbal consent now.  All virtual visits are billed to your insurance company just like a normal visit would be.  By agreeing to a virtual visit, we'd like you to understand that the technology does not allow for your provider to perform an examination, and thus may limit your provider's ability to fully assess your condition.  Finally, though the technology is pretty good, we cannot assure that it will always work on either your or our end, and in the setting of a video visit, we may have to convert it to a phone-only visit.  In either situation, we cannot ensure that we have a secure connection.  Are you willing to proceed?"  2. Give patient instructions for WebEx download to smartphone as below if video visit  3. Advise patient to be prepared with any vital sign or heart rhythm information, their current medicines, and a piece of paper and pen handy for any instructions they may receive the day of their visit  4. Inform patient they will receive a phone call 15 minutes prior to their appointment time (may be from unknown caller ID) so they should be prepared to answer  5. Confirm that appointment type is correct in Epic appointment notes (video vs telephone)    TELEPHONE CALL NOTE  Kenneth Duarte has been deemed a candidate for a follow-up tele-health visit to limit community exposure during the Covid-19 pandemic. I spoke with the patient via phone to ensure availability of phone/video source, confirm preferred email & phone number, and discuss  instructions and expectations.  I reminded Kenneth Duarte to be prepared with any vital sign and/or heart rhythm information that could potentially be obtained via home monitoring, at the time of his visit. I reminded Kenneth Duarte to expect a phone call at the time of his visit if his visit.  Did the patient verbally acknowledge consent to treatment? YES  Janan Ridge, [place] 03/20/2019 1:05 PM   CONSENT FOR TELE-HEALTH VISIT - PLEASE REVIEW  I hereby voluntarily request, consent and authorize [facility] and its employed or contracted physicians, physician assistants, nurse practitioners or other licensed health care professionals (the Practitioner), to provide me with telemedicine health care services (the Services") as deemed necessary by the treating Practitioner. I acknowledge and consent to receive the Services by the Practitioner via telemedicine. I understand that the telemedicine visit will involve communicating with the Practitioner through live audiovisual communication technology and the disclosure of certain medical information by electronic transmission. I acknowledge that I have been given the opportunity to request an in-person assessment or other available alternative prior to the telemedicine visit and am voluntarily participating in the telemedicine visit.  I understand that I have the right to withhold or withdraw my consent to the use of telemedicine in the course of my care at any time, without affecting my right to future care or treatment, and that the Practitioner or I may terminate the telemedicine visit at any time. I understand that I  have the right to inspect all information obtained and/or recorded in the course of the telemedicine visit and may receive copies of available information for a reasonable fee.  I understand that some of the potential risks of receiving the Services via telemedicine include:   Delay or interruption in medical evaluation due to  technological equipment failure or disruption;  Information transmitted may not be sufficient (e.g. poor resolution of images) to allow for appropriate medical decision making by the Practitioner; and/or   In rare instances, security protocols could fail, causing a breach of personal health information.  Furthermore, I acknowledge that it is my responsibility to provide information about my medical history, conditions and care that is complete and accurate to the best of my ability. I acknowledge that Practitioner's advice, recommendations, and/or decision may be based on factors not within their control, such as incomplete or inaccurate data provided by me or distortions of diagnostic images or specimens that may result from electronic transmissions. I understand that the practice of medicine is not an exact science and that Practitioner makes no warranties or guarantees regarding treatment outcomes. I acknowledge that I will receive a copy of this consent concurrently upon execution via email to the email address I last provided but may also request a printed copy by calling the office of [place].    I understand that my insurance will be billed for this visit.   I have read or had this consent read to me.  I understand the contents of this consent, which adequately explains the benefits and risks of the Services being provided via telemedicine.   I have been provided ample opportunity to ask questions regarding this consent and the Services and have had my questions answered to my satisfaction.  I give my informed consent for the services to be provided through the use of telemedicine in my medical care  By participating in this telemedicine visit I agree to the above.

## 2019-03-31 ENCOUNTER — Other Ambulatory Visit: Payer: Self-pay | Admitting: Cardiovascular Disease

## 2019-04-07 NOTE — Progress Notes (Signed)
Virtual Visit via Telephone Note   This visit type was conducted due to national recommendations for restrictions regarding the COVID-19 Pandemic (e.g. social distancing) in an effort to limit this patient's exposure and mitigate transmission in our community.  Due to his co-morbid illnesses, this patient is at least at moderate risk for complications without adequate follow up.  This format is felt to be most appropriate for this patient at this time.  The patient did not have access to video technology/had technical difficulties with video requiring transitioning to audio format only (telephone).  All issues noted in this document were discussed and addressed.  No physical exam could be performed with this format.  Please refer to the patient's chart for his  consent to telehealth for [facility].   I connected with  Kenneth Duarte on 04/07/19 by a video enabled telemedicine application and verified that I am speaking with the correct person using two identifiers. I discussed the limitations of evaluation and management by telemedicine. The patient expressed understanding and agreed to proceed.   Evaluation Performed:  Follow-up visit  Date:  04/07/2019   ID:  Kenneth Duarte, [place] 1946-01-27, MRN [id number]  Patient Location:  [place] [address]   Provider location:   [facility], [place] office  PCP:  Ellamae Sia, MD  Cardiologist:  Patsy Baltimore   Chief Complaint:  Dizzy    History of Present Illness:    Kenneth Duarte is a 73 y.o. male who presents via audio/video conferencing for a telehealth visit today.   The patient does not symptoms concerning for COVID-19 infection (fever, chills, cough, or new SHORTNESS OF BREATH).   Patient has a past medical history of Smoker quit 1980 CAD, CABG  in 03/2017.  3V CABG with LIMA to LAD, SVG to OM, and SVG to PDA by Dr. Nils Pyle EF 50 to 55% by TEE Chest pain, transferred  to [facility] Had stent placed to ostial vein graft to OM July 2018, PROMUS PREM MR 6.8L27  Who presents for follow-up of his coronary disease, CABG  Dizzy,  Blood pressure 517 - 001 systolic Seen by Dr. Quay Burow Suggested 2 lisinopril Helped a little, Takes 1 pill BID Still dizzy some Friend told him to take motion sickness pill Pill seems to help Seems to help some Pressures were 140/80 He bought a cuff 110-115/65 Denies any orthostasis symptoms   Little chest pain with exertion, Better now Does not know why it seem to go away No regular exercise program  Very active in the summer, does not drink a lot, Still taking Lasix 40 daily   Other past medical history reviewed ST depression in lateral leads and elevated troponin @ 1.57--> 6.41. 3V CAD with 75% occl LIMA-->LAD near anastomotic site, occl SVG--> PDA and 95% occl SVG--> OM1 and 95% occl of native vessel distal to anastomotic site.  Cardiac catheterization July 2018 1. Severe three-vessel coronary artery disease with heavily calcified, eccentric 50-60% stenosis distal left main, occluded proximal left circumflex, subtotal mid RCA. 2. Patent LIMA to mid LAD with discrete 75% stenosis near anastomotic site 3. Occluded SVG to PDA 4. Patent SVG to OM1 with a long diffuse 95% stenosis proximal segment of graft, with near 60-70% stenosis at the anastomotic site, 95% stenosis in the native vessel distal to the anastomotic site, with retrograde filling of left circumflex 5. Mildly reduced left ventricular function, with inferior-posterior wall akinesis  Transferred  to [facility] Had stent placed to ostial vein graft to OM Dist Graft lesion, 75 %stenosed. There is more disease noted past the graft insertion in the OM and in the distal circumflex.   Prior CV studies:   The following studies were reviewed today:    Past Medical History:  Diagnosis Date  . Arthritis   . Chronic pain syndrome   . Congestive heart failure ([place]) 1980   . Coronary artery disease    a. s/p 3V CABG (03/2017). b. NSTEMI 06/2017 with progressive graft disease, s/p DES to SVG-OM.  . Diabetes ([place])    DIET  . Dyspnea    with exertion  . ED (erectile dysfunction)   . GERD (gastroesophageal reflux disease)   . Gout   . Headache   . Hyperlipemia   . Hypertension    CONTROLLED ON MEDS  . Neuropathy of both feet   . Seborrheic keratosis   . Sinus congestion   . Wears dentures    PARTIAL UPPER   Past Surgical History:  Procedure Laterality Date  . COLONOSCOPY WITH PROPOFOL N/A 10/25/2015   Procedure: COLONOSCOPY WITH PROPOFOL;  Surgeon: Lucilla Lame, MD;  Location: [facility];  Service: Endoscopy;  Laterality: N/A;  DIABETIC-ORAL MEDS  . CORONARY ARTERY BYPASS GRAFT N/A 04/10/2017   Procedure: CORONARY ARTERY BYPASS GRAFTING (CABG) x three , using left internal mammary artery and right leg greater saphenous vein harvested endoscopically;  Surgeon: Ivin Poot, MD;  Location: [place];  Service: Open Heart Surgery;  Laterality: N/A;  . CORONARY ARTERY BYPASS GRAFT    . CORONARY STENT INTERVENTION N/A 06/19/2017   Procedure: Coronary Stent Intervention;  Surgeon: Jettie Booze, MD;  Location: [place] [facility];  Service: Cardiovascular;  Laterality: N/A;  . KNEE SURGERY Right    over 20 years ago  . LEFT HEART CATH AND CORONARY ANGIOGRAPHY Left 02/22/2017   Procedure: Left Heart Cath and Coronary Angiography;  Surgeon: Corey Skains, MD;  Location: [place] [facility];  Service: Cardiovascular;  Laterality: Left;  . LEFT HEART CATH AND CORS/GRAFTS ANGIOGRAPHY N/A 06/18/2017   Procedure: Left Heart Cath and Cors/Grafts Angiography and PCI;  Surgeon: Isaias Cowman, MD;  Location: [place] [facility];  Service: Cardiovascular;  Laterality: N/A;  . POLYPECTOMY  10/25/2015   Procedure: POLYPECTOMY;  Surgeon: Lucilla Lame, MD;  Location: [address];  Service: Endoscopy;;  . ROTATOR CUFF REPAIR Right 2012  . TEE WITHOUT  CARDIOVERSION N/A 04/10/2017   Procedure: TRANSESOPHAGEAL ECHOCARDIOGRAM (TEE);  Surgeon: Ivin Poot, MD;  Location: [place];  Service: Open Heart Surgery;  Laterality: N/A;     No outpatient medications have been marked as taking for the 04/08/19 encounter (Appointment) with Minna Merritts, MD.     Allergies:   Blood-group specific substance and Cortisone acetate [cortisone]   Social History   Tobacco Use  . Smoking status: Former Smoker    Packs/day: 2.00    Years: 15.00    Pack years: 30.00    Types: Cigarettes    Last attempt to quit: 06/30/1979    Years since quitting: 39.7  . Smokeless tobacco: Never Used  Substance Use Topics  . Alcohol use: No    Alcohol/week: 0.0 standard drinks    Comment: QUIT IN 1980  . Drug use: No     Current Outpatient Medications on File Prior to Visit  Medication Sig Dispense Refill  . acetaminophen (TYLENOL) 500 MG tablet Take 2 tablets (1,000 mg  total) by mouth every 6 (six) hours as needed. (Patient taking differently: Take 1,000 mg by mouth every 6 (six) hours as needed for mild pain, fever or headache. ) 30 tablet 0  . albuterol (PROVENTIL HFA;VENTOLIN HFA) 108 (90 Base) MCG/ACT inhaler Inhale into the lungs.    Marland Kitchen amLODipine (NORVASC) 5 MG tablet Take 1 tablet (5 mg total) by mouth daily. 90 tablet 0  . aspirin 81 MG tablet Take 1 tablet (81 mg total) by mouth daily. 90 tablet 1  . atorvastatin (LIPITOR) 80 MG tablet TAKE  1  TABLET  AT  BEDTIME 90 tablet 0  . beclomethasone (QVAR) 40 MCG/ACT inhaler Inhale 2 puffs into the lungs 2 (two) times daily.     . carvedilol (COREG) 12.5 MG tablet TAKE 1 TABLET TWICE DAILY (NEED AN APPOINTMENT FOR FURTHER REFILLS) 180 tablet 0  . clopidogrel (PLAVIX) 75 MG tablet Take 1 tablet (75 mg total) by mouth daily. 90 tablet 0  . docusate sodium (COLACE) 100 MG capsule Take 100 mg by mouth 2 (two) times daily as needed.     . doxycycline (VIBRAMYCIN) 100 MG capsule Take 1 capsule (100 mg total) by mouth  2 (two) times daily. 20 capsule 0  . fluticasone (FLONASE) 50 MCG/ACT nasal spray Place into the nose.    . furosemide (LASIX) 40 MG tablet TAKE 1 TABLET (40 MG TOTAL) BY MOUTH DAILY. 90 tablet 1  . gabapentin (NEURONTIN) 800 MG tablet Take 1,600 mg by mouth 3 (three) times daily.    . IBU 800 MG tablet     . levofloxacin (LEVAQUIN) 500 MG tablet Take 500 mg by mouth daily.    Marland Kitchen lisinopril (PRINIVIL,ZESTRIL) 20 MG tablet TAKE 1 TABLET EVERY DAY (NEED AN APPOINTMENT FOR FURTHER REFILLS) 90 tablet 0  . metFORMIN (GLUCOPHAGE-XR) 500 MG 24 hr tablet Take 500 mg by mouth daily.    . nitroGLYCERIN (NITROSTAT) 0.4 MG SL tablet Place 1 tablet (0.4 mg total) under the tongue every 5 (five) minutes as needed for chest pain. 25 tablet 3  . NONFORMULARY OR COMPOUNDED ITEM Trimix (30/1/10)-(Pap/Phent/PGE) Dosage: Inject 1 cc per injection Prefilled syringe   Qty 6 Refills [place] [phone] Fax [phone] 6 each 11  . oxyCODONE-acetaminophen (PERCOCET) 10-325 MG tablet Take 1 tablet by mouth every 4 (four) hours as needed for pain. 40 tablet 0  . pantoprazole (PROTONIX) 40 MG tablet TAKE 1 TABLET (40 MG TOTAL) BY MOUTH DAILY. 90 tablet 3  . pioglitazone (ACTOS) 15 MG tablet Take 15 mg by mouth at bedtime.     . Vitamin D, Ergocalciferol, (DRISDOL) 1.25 MG (50000 UT) CAPS capsule Take by mouth.     No current facility-administered medications on file prior to visit.      Family Hx: The patient's family history includes Cancer in his father and mother; Diabetes in his brother and sister; Heart attack in his father; Heart disease in his brother and father; Prostate cancer in his brother. There is no history of Bladder Cancer or Kidney disease.  ROS:   Please see the history of present illness.    Review of Systems  Constitutional: Negative.   Respiratory: Negative.   Cardiovascular: Negative.   Gastrointestinal: Negative.   Musculoskeletal: Negative.   Neurological: Positive  for dizziness.  Psychiatric/Behavioral: Negative.   All other systems reviewed and are negative.     Labs/Other Tests and Data Reviewed:    Recent Labs: No results found for requested labs within last 8760  hours.   Recent Lipid Panel Lab Results  Component Value Date/Time   CHOL 91 06/18/2017 11:37 PM   TRIG 198 (H) 06/18/2017 11:37 PM   HDL 22 (L) 06/18/2017 11:37 PM   CHOLHDL 4.1 06/18/2017 11:37 PM   LDLCALC 29 06/18/2017 11:37 PM    Wt Readings from Last 3 Encounters:  02/20/19 208 lb (94.3 kg)  03/07/18 220 lb 12 oz (100.1 kg)  01/30/18 220 lb (99.8 kg)     Exam:    Vital Signs: Vital signs may also be detailed in the HPI There were no vitals taken for this visit.  Wt Readings from Last 3 Encounters:  02/20/19 208 lb (94.3 kg)  03/07/18 220 lb 12 oz (100.1 kg)  01/30/18 220 lb (99.8 kg)   Temp Readings from Last 3 Encounters:  06/20/17 98.2 F (36.8 C) (Oral)  06/18/17 98.2 F (36.8 C) (Oral)  04/15/17 98.4 F (36.9 C) (Oral)   BP Readings from Last 3 Encounters:  02/20/19 (!) 152/74  03/07/18 140/62  01/30/18 110/62   Pulse Readings from Last 3 Encounters:  02/20/19 75  03/07/18 69  01/30/18 69    Vitals 110-115/65 65 resp 16  Well nourished, well developed male in no acute distress. Constitutional:  oriented to person, place, and time. No distress.     ASSESSMENT & PLAN:    Coronary artery disease of bypass graft of native heart with stable angina pectoris ([place]) - Plan: EKG 12-Lead Reports that he was having some chest discomfort with exertion now it seem to go away he does not know why Currently feels fine without any symptoms No further ischemic work-up at this time but we have recommended he call us for any recurrent symptoms  Benign essential HTN Blood pressure was elevated in the setting of vertigo then seemed to drop down with 2 lisinopril's and vertigo medication that he bought over-the-counter He will monitor blood pressure and  cut back on the lisinopril for any orthostasis symptoms Recommend he cut back on the Lasix down to 20 daily Likely contributing to low sodium on lab work last year  Type 2 diabetes mellitus with other circulatory complication, without long-term current use of insulin ([place]) On metformin, reports sugars are well controlled but no numbers available from primary care  Dyslipidemia Cholesterol is at goal on the current lipid regimen. No changes to the medications were made.  Reviewed with him, stable  S/P CABG x 3 - Plan: EKG 12-Lead Previously declined cardiac rehab Reports he is active, occasional chest discomfort with exertion but this symptoms seem to have resolved   COVID-19 Education: The signs and symptoms of COVID-19 were discussed with the patient and how to seek care for testing (follow up with PCP or arrange E-visit).  The importance of social distancing was discussed today.  Patient Risk:   After full review of this patients clinical status, I feel that they are at least moderate risk at this time.  Time:   Today, I have spent 25 minutes with the patient with telehealth technology discussing the cardiac and medical problems/diagnoses detailed above   10 min spent reviewing the chart prior to patient visit today   Medication Adjustments/Labs and Tests Ordered: Current medicines are reviewed at length with the patient today.  Concerns regarding medicines are outlined above.   Tests Ordered: No tests ordered   Medication Changes: Changes as above   Disposition: Follow-up in 12 months   Signed, Ida Rogue, MD  04/07/2019 12:48 PM  [place] Office [address] [id number], [place], [place] [address]

## 2019-04-08 ENCOUNTER — Other Ambulatory Visit: Payer: Self-pay

## 2019-04-08 ENCOUNTER — Telehealth (INDEPENDENT_AMBULATORY_CARE_PROVIDER_SITE_OTHER): Payer: Medicare PPO | Admitting: Cardiovascular Disease

## 2019-04-08 DIAGNOSIS — I10 Essential (primary) hypertension: Secondary | ICD-10-CM | POA: Diagnosis not present

## 2019-04-08 DIAGNOSIS — I25708 Atherosclerosis of coronary artery bypass graft(s), unspecified, with other forms of angina pectoris: Secondary | ICD-10-CM

## 2019-04-08 DIAGNOSIS — E1159 Type 2 diabetes mellitus with other circulatory complications: Secondary | ICD-10-CM

## 2019-04-08 DIAGNOSIS — E785 Hyperlipidemia, unspecified: Secondary | ICD-10-CM | POA: Diagnosis not present

## 2019-04-08 DIAGNOSIS — I739 Peripheral vascular disease, unspecified: Secondary | ICD-10-CM | POA: Diagnosis not present

## 2019-04-08 DIAGNOSIS — Z951 Presence of aortocoronary bypass graft: Secondary | ICD-10-CM

## 2019-04-08 MED ORDER — FUROSEMIDE 20 MG PO TABS: 20.0000 mg | ORAL_TABLET | Freq: Every day | ORAL | Status: DC

## 2019-04-08 MED ORDER — MECLIZINE HCL 25 MG PO TABS: 25.0000 mg | ORAL_TABLET | Freq: Two times a day (BID) | ORAL | 0 refills | Status: DC | PRN

## 2019-04-08 NOTE — Patient Instructions (Addendum)
Medication Instructions:   Please  decrease the furosemide down to 20 mg daily  Refill meds 90 days  Start meclizine 25 mg TID prn, #90, 0 refills [employer] mail order   If you need a refill on your cardiac medications before your next appointment, please call your pharmacy.    Lab work: No new labs needed  If you have labs (blood work) drawn today and your tests are completely normal, you will receive your results only by: Marland Kitchen MyChart Message (if you have MyChart) OR . A paper copy in the mail If you have any lab test that is abnormal or we need to change your treatment, we will call you to review the results.   Testing/Procedures: No new testing needed   Follow-Up: At [facility], you and your health needs are our priority.  As part of our continuing mission to provide you with exceptional heart care, we have created designated Provider Care Teams.  These Care Teams include your primary Cardiologist (physician) and Advanced Practice Providers (APPs -  Physician Assistants and Nurse Practitioners) who all work together to provide you with the care you need, when you need it.  . You will need a follow up appointment in 12 months .   Please call our office 2 months in advance to schedule this appointment.    . Providers on your designated Care Team:   . Murray Hodgkins, NP . Christell Faith, PA-C . Marrianne Mood, PA-C  Any Other Special Instructions Will Be Listed Below (If Applicable).  For educational health videos Log in to : [email or web address] Or : [email or web address], password : [email or web address]

## 2019-04-28 ENCOUNTER — Other Ambulatory Visit: Payer: Self-pay | Admitting: Cardiovascular Disease

## 2019-05-09 ENCOUNTER — Other Ambulatory Visit: Payer: Self-pay | Admitting: Cardiovascular Disease

## 2019-05-14 ENCOUNTER — Other Ambulatory Visit: Payer: Self-pay | Admitting: Cardiovascular Disease

## 2019-05-19 ENCOUNTER — Other Ambulatory Visit: Payer: Self-pay | Admitting: Specialist

## 2019-05-19 DIAGNOSIS — R918 Other nonspecific abnormal finding of lung field: Secondary | ICD-10-CM

## 2019-05-21 ENCOUNTER — Other Ambulatory Visit: Payer: Self-pay | Admitting: Cardiovascular Disease

## 2019-05-28 ENCOUNTER — Other Ambulatory Visit: Payer: Self-pay | Admitting: Cardiovascular Disease

## 2019-05-28 NOTE — Telephone Encounter (Signed)
Please advise if ok to refill Meclizine 25 mg tablet bid prn. Last refilled by [facility] not cardiac med.

## 2019-05-28 NOTE — Telephone Encounter (Signed)
Dr Rockey Situ prescribed at last office visit. Refill sent in.

## 2019-06-16 DIAGNOSIS — M869 Osteomyelitis, unspecified: Secondary | ICD-10-CM | POA: Insufficient documentation

## 2019-07-09 ENCOUNTER — Other Ambulatory Visit: Payer: Self-pay | Admitting: Cardiovascular Disease

## 2019-08-05 ENCOUNTER — Ambulatory Visit
Admission: RE | Admit: 2019-08-05 | Discharge: 2019-08-05 | Disposition: A | Discharge status: Home / Self Care | Payer: Medicare PPO | Source: Ambulatory Visit | Attending: Specialist | Admitting: Specialist

## 2019-08-05 ENCOUNTER — Other Ambulatory Visit: Payer: Self-pay

## 2019-08-05 ENCOUNTER — Encounter (INDEPENDENT_AMBULATORY_CARE_PROVIDER_SITE_OTHER): Payer: Self-pay

## 2019-08-05 DIAGNOSIS — R918 Other nonspecific abnormal finding of lung field: Secondary | ICD-10-CM | POA: Diagnosis not present

## 2019-09-15 ENCOUNTER — Other Ambulatory Visit: Payer: Self-pay | Admitting: Cardiovascular Disease

## 2019-10-01 ENCOUNTER — Other Ambulatory Visit: Payer: Self-pay | Admitting: Cardiovascular Disease

## 2019-10-21 ENCOUNTER — Inpatient Hospital Stay: Payer: Medicare PPO | Attending: Oncology | Admitting: Oncology

## 2019-10-21 ENCOUNTER — Encounter: Payer: Self-pay | Admitting: Oncology

## 2019-10-21 ENCOUNTER — Other Ambulatory Visit: Payer: Self-pay

## 2019-10-21 ENCOUNTER — Inpatient Hospital Stay: Payer: Medicare PPO

## 2019-10-21 VITALS — BP 128/62 | HR 64 | Temp 97.2°F | Resp 18 | Wt 212.8 lb

## 2019-10-21 DIAGNOSIS — E871 Hypo-osmolality and hyponatremia: Secondary | ICD-10-CM | POA: Diagnosis not present

## 2019-10-21 DIAGNOSIS — E119 Type 2 diabetes mellitus without complications: Secondary | ICD-10-CM | POA: Diagnosis not present

## 2019-10-21 DIAGNOSIS — I2510 Atherosclerotic heart disease of native coronary artery without angina pectoris: Secondary | ICD-10-CM | POA: Insufficient documentation

## 2019-10-21 DIAGNOSIS — D649 Anemia, unspecified: Secondary | ICD-10-CM

## 2019-10-21 DIAGNOSIS — F17210 Nicotine dependence, cigarettes, uncomplicated: Secondary | ICD-10-CM | POA: Diagnosis not present

## 2019-10-21 DIAGNOSIS — Z951 Presence of aortocoronary bypass graft: Secondary | ICD-10-CM | POA: Insufficient documentation

## 2019-10-21 DIAGNOSIS — Z79899 Other long term (current) drug therapy: Secondary | ICD-10-CM | POA: Diagnosis not present

## 2019-10-21 DIAGNOSIS — D509 Iron deficiency anemia, unspecified: Secondary | ICD-10-CM

## 2019-10-21 DIAGNOSIS — I509 Heart failure, unspecified: Secondary | ICD-10-CM | POA: Diagnosis not present

## 2019-10-21 DIAGNOSIS — Z7984 Long term (current) use of oral hypoglycemic drugs: Secondary | ICD-10-CM | POA: Insufficient documentation

## 2019-10-21 DIAGNOSIS — M1990 Unspecified osteoarthritis, unspecified site: Secondary | ICD-10-CM | POA: Diagnosis not present

## 2019-10-21 DIAGNOSIS — I252 Old myocardial infarction: Secondary | ICD-10-CM | POA: Insufficient documentation

## 2019-10-21 DIAGNOSIS — N179 Acute kidney failure, unspecified: Secondary | ICD-10-CM | POA: Diagnosis not present

## 2019-10-21 HISTORY — DX: Iron deficiency anemia, unspecified: D50.9

## 2019-10-21 LAB — COMPREHENSIVE METABOLIC PANEL
ALT: 14 U/L (ref 0–44)
AST: 15 U/L (ref 15–41)
Albumin: 3.7 g/dL (ref 3.5–5.0)
Alkaline Phosphatase: 60 U/L (ref 38–126)
Anion gap: 10 (ref 5–15)
BUN: 20 mg/dL (ref 8–23)
CO2: 24 mmol/L (ref 22–32)
Calcium: 8.7 mg/dL — ABNORMAL LOW (ref 8.9–10.3)
Chloride: 93 mmol/L — ABNORMAL LOW (ref 98–111)
Creatinine, Ser: 1.44 mg/dL — ABNORMAL HIGH (ref 0.61–1.24)
GFR calc Af Amer: 56 mL/min — ABNORMAL LOW (ref >60)
GFR calc non Af Amer: 49 mL/min — ABNORMAL LOW (ref >60)
Glucose, Bld: 129 mg/dL — ABNORMAL HIGH (ref 70–99)
Potassium: 4.3 mmol/L (ref 3.5–5.1)
Sodium: 127 mmol/L — ABNORMAL LOW (ref 135–145)
Total Bilirubin: 0.7 mg/dL (ref 0.3–1.2)
Total Protein: 7.1 g/dL (ref 6.5–8.1)

## 2019-10-21 LAB — CBC WITH DIFFERENTIAL/PLATELET
Abs Immature Granulocytes: 0.01 10*3/uL (ref 0.00–0.07)
Basophils Absolute: 0 10*3/uL (ref 0.0–0.1)
Basophils Relative: 1 %
Eosinophils Absolute: 0.1 10*3/uL (ref 0.0–0.5)
Eosinophils Relative: 2 %
HCT: 26.9 % — ABNORMAL LOW (ref 39.0–52.0)
Hemoglobin: 9 g/dL — ABNORMAL LOW (ref 13.0–17.0)
Immature Granulocytes: 0 %
Lymphocytes Relative: 44 %
Lymphs Abs: 2.1 10*3/uL (ref 0.7–4.0)
MCH: 28 pg (ref 26.0–34.0)
MCHC: 33.5 g/dL (ref 30.0–36.0)
MCV: 83.8 fL (ref 80.0–100.0)
Monocytes Absolute: 0.4 10*3/uL (ref 0.1–1.0)
Monocytes Relative: 9 %
Neutro Abs: 2.2 10*3/uL (ref 1.7–7.7)
Neutrophils Relative %: 44 %
Platelets: 160 10*3/uL (ref 150–400)
RBC: 3.21 MIL/uL — ABNORMAL LOW (ref 4.22–5.81)
RDW: 14.6 % (ref 11.5–15.5)
WBC: 4.8 10*3/uL (ref 4.0–10.5)
nRBC: 0 % (ref 0.0–0.2)

## 2019-10-21 LAB — RETIC PANEL
Immature Retic Fract: 12.3 % (ref 2.3–15.9)
RBC.: 3.21 MIL/uL — ABNORMAL LOW (ref 4.22–5.81)
Retic Count, Absolute: 76.1 10*3/uL (ref 19.0–186.0)
Retic Ct Pct: 2.4 % (ref 0.4–3.1)
Reticulocyte Hemoglobin: 30.4 pg (ref >27.9)

## 2019-10-21 LAB — IRON AND TIBC
Iron: 38 ug/dL — ABNORMAL LOW (ref 45–182)
Saturation Ratios: 10 % — ABNORMAL LOW (ref 17.9–39.5)
TIBC: 400 ug/dL (ref 250–450)
UIBC: 362 ug/dL

## 2019-10-21 LAB — TSH: TSH: 3.425 u[IU]/mL (ref 0.350–4.500)

## 2019-10-21 LAB — TECHNOLOGIST SMEAR REVIEW: Plt Morphology: ADEQUATE

## 2019-10-21 LAB — FERRITIN: Ferritin: 10 ng/mL — ABNORMAL LOW (ref 24–336)

## 2019-10-21 MED ORDER — FERROUS SULFATE 325 (65 FE) MG PO TABS: 325.0000 mg | ORAL_TABLET | Freq: Two times a day (BID) | ORAL | 0 refills | Status: DC

## 2019-10-21 NOTE — Progress Notes (Signed)
Hematology/Oncology Consult note [facility] [phone] [phone]   Patient Care Team: Ellamae Sia, MD as PCP - General (Internal Medicine)  REFERRING PROVIDER: Ellamae Sia, MD  CHIEF COMPLAINTS/REASON FOR VISIT:  Evaluation of anemia  HISTORY OF PRESENTING ILLNESS:  Kenneth Duarte is a  73 y.o.  male with PMH listed below who was referred to me for evaluation of anemia Reviewed patient's recent labs that was done at [facility] clinic.  /28/2020 hemoglobin 9.7, hematocrit 29, MCV 86, platelet 179 ,000 vitamin B12 462, folate 4.6 Reviewed patient's previous labs, anemia is chronic onset , duration is since 2018 with baseline 9-10 recently. No aggravating or improving factors.  Associated signs and symptoms:  He feels pretty well.  No profound fatigue or shortness of breath.  He only feels minimal shortness of breath after climbing a flight of stairs. Denies weight loss, easy bruising, hematochezia, hemoptysis, hematuria. Context: History of GI bleeding: Denies               History of Chronic kidney disease: Denies               History of autoimmune disease: Denies               History of hemolytic anemia.  Denies               Last colonoscopy: 2016-sigmoid polyp resected  period.  He follows up with cardiology for CAD, CABG x3 Denies weight loss, fever, chills, fatigue, night sweats.   Review of Systems  Constitutional: Positive for fatigue. Negative for appetite change, chills, fever and unexpected weight change.  HENT:   Negative for hearing loss and voice change.   Eyes: Negative for eye problems and icterus.  Respiratory: Negative for chest tightness, cough and shortness of breath.   Cardiovascular: Negative for chest pain and leg swelling.  Gastrointestinal: Negative for abdominal distention and abdominal pain.  Endocrine: Negative for hot flashes.  Genitourinary: Negative for difficulty urinating,  dysuria and frequency.   Musculoskeletal: Negative for arthralgias.  Skin: Negative for itching and rash.  Neurological: Negative for light-headedness and numbness.  Hematological: Negative for adenopathy. Does not bruise/bleed easily.  Psychiatric/Behavioral: Negative for confusion.     MEDICAL HISTORY:  Past Medical History:  Diagnosis Date   Arthritis    Chronic pain syndrome    Congestive heart failure ([place]) 1980   Coronary artery disease    a. s/p 3V CABG (03/2017). b. NSTEMI 06/2017 with progressive graft disease, s/p DES to SVG-OM.   Diabetes ([place])    DIET   Dyspnea    with exertion   ED (erectile dysfunction)    GERD (gastroesophageal reflux disease)    Gout    Headache    Hyperlipemia    Hypertension    CONTROLLED ON MEDS   Neuropathy of both feet    Seborrheic keratosis    Sinus congestion    Wears dentures    PARTIAL UPPER    SURGICAL HISTORY: Past Surgical History:  Procedure Laterality Date   COLONOSCOPY WITH PROPOFOL N/A 10/25/2015   Procedure: COLONOSCOPY WITH PROPOFOL;  Surgeon: Lucilla Lame, MD;  Location: [place];  Service: Endoscopy;  Laterality: N/A;  DIABETIC-ORAL MEDS   CORONARY ARTERY BYPASS GRAFT N/A 04/10/2017   Procedure: CORONARY ARTERY BYPASS GRAFTING (CABG) x three , using left internal mammary artery and right leg greater saphenous vein harvested endoscopically;  Surgeon: Ivin Poot, MD;  Location: [facility]  OR;  Service: Open Heart Surgery;  Laterality: N/A;   CORONARY ARTERY BYPASS GRAFT     CORONARY STENT INTERVENTION N/A 06/19/2017   Procedure: Coronary Stent Intervention;  Surgeon: Jettie Booze, MD;  Location: [place] CV LAB;  Service: Cardiovascular;  Laterality: N/A;   KNEE SURGERY Right    over 20 years ago   LEFT HEART CATH AND CORONARY ANGIOGRAPHY Left 02/22/2017   Procedure: Left Heart Cath and Coronary Angiography;  Surgeon: Corey Skains, MD;  Location: [place] CV LAB;  Service:  Cardiovascular;  Laterality: Left;   LEFT HEART CATH AND CORS/GRAFTS ANGIOGRAPHY N/A 06/18/2017   Procedure: Left Heart Cath and Cors/Grafts Angiography and PCI;  Surgeon: Isaias Cowman, MD;  Location: [place] CV LAB;  Service: Cardiovascular;  Laterality: N/A;   POLYPECTOMY  10/25/2015   Procedure: POLYPECTOMY;  Surgeon: Lucilla Lame, MD;  Location: [place];  Service: Endoscopy;;   ROTATOR CUFF REPAIR Right 2012   TEE WITHOUT CARDIOVERSION N/A 04/10/2017   Procedure: TRANSESOPHAGEAL ECHOCARDIOGRAM (TEE);  Surgeon: Ivin Poot, MD;  Location: [place];  Service: Open Heart Surgery;  Laterality: N/A;    SOCIAL HISTORY: Social History   Socioeconomic History   Marital status: Married    Spouse name: Not on file   Number of children: Not on file   Years of education: Not on file   Highest education level: Not on file  Occupational History   Not on file  Social Needs   Financial resource strain: Not on file   Food insecurity    Worry: Not on file    Inability: Not on file   Transportation needs    Medical: Not on file    Non-medical: Not on file  Tobacco Use   Smoking status: Former Smoker    Packs/day: 2.00    Years: 15.00    Pack years: 30.00    Types: Cigarettes    Quit date: 06/30/1979    Years since quitting: 40.3   Smokeless tobacco: Never Used  Substance and Sexual Activity   Alcohol use: No    Alcohol/week: 0.0 standard drinks    Comment: QUIT IN 1980   Drug use: No   Sexual activity: Not on file  Lifestyle   Physical activity    Days per week: Not on file    Minutes per session: Not on file   Stress: Not on file  Relationships   Social connections    Talks on phone: Not on file    Gets together: Not on file    Attends religious service: Not on file    Active member of club or organization: Not on file    Attends meetings of clubs or organizations: Not on file    Relationship status: Not on file   Intimate partner  violence    Fear of current or ex partner: Not on file    Emotionally abused: Not on file    Physically abused: Not on file    Forced sexual activity: Not on file  Other Topics Concern   Not on file  Social History Narrative   Not on file    FAMILY HISTORY: Family History  Problem Relation Age of Onset   Cancer Father    Heart disease Father    Heart attack Father    Cancer Mother    Heart disease Brother    Diabetes Brother    Prostate cancer Brother    Diabetes Sister    Bladder Cancer  Neg Hx    Kidney disease Neg Hx     ALLERGIES:  is allergic to blood-group specific substance and cortisone acetate [cortisone].  MEDICATIONS:  Current Outpatient Medications  Medication Sig Dispense Refill   acetaminophen (TYLENOL) 500 MG tablet Take 2 tablets (1,000 mg total) by mouth every 6 (six) hours as needed. (Patient taking differently: Take 1,000 mg by mouth every 6 (six) hours as needed for mild pain, fever or headache. ) 30 tablet 0   albuterol (PROVENTIL HFA;VENTOLIN HFA) 108 (90 Base) MCG/ACT inhaler Inhale into the lungs.     amLODipine-benazepril (LOTREL) 5-20 MG capsule      aspirin 81 MG tablet Take 1 tablet (81 mg total) by mouth daily. 90 tablet 1   atorvastatin (LIPITOR) 80 MG tablet TAKE 1 TABLET AT BEDTIME 90 tablet 2   carvedilol (COREG) 12.5 MG tablet Take 1 tablet (12.5 mg total) by mouth 2 (two) times daily with a meal. 180 tablet 2   docusate sodium (COLACE) 100 MG capsule Take 100 mg by mouth 2 (two) times daily as needed.      fluticasone (FLONASE) 50 MCG/ACT nasal spray Place into the nose.     furosemide (LASIX) 20 MG tablet Take 1 tablet (20 mg total) by mouth daily. (Patient taking differently: Take 20 mg by mouth daily. 2 tabs QAM)     gabapentin (NEURONTIN) 800 MG tablet Take 1,600 mg by mouth 3 (three) times daily.     IBU 800 MG tablet      meclizine (ANTIVERT) 25 MG tablet TAKE 1 TABLET (25 MG TOTAL) BY MOUTH 2 (TWO) TIMES DAILY  AS NEEDED FOR DIZZINESS. 180 tablet 1   metFORMIN (GLUCOPHAGE-XR) 500 MG 24 hr tablet Take 500 mg by mouth 2 (two) times daily.      nitroGLYCERIN (NITROSTAT) 0.4 MG SL tablet Place 1 tablet (0.4 mg total) under the tongue every 5 (five) minutes as needed for chest pain. 25 tablet 3   oxyCODONE-acetaminophen (PERCOCET) 10-325 MG tablet Take 1 tablet by mouth every 4 (four) hours as needed for pain. 40 tablet 0   pantoprazole (PROTONIX) 40 MG tablet TAKE 1 TABLET EVERY DAY 90 tablet 0   vitamin B-12 (CYANOCOBALAMIN) 1000 MCG tablet      ferrous sulfate 325 (65 FE) MG tablet Take by mouth.     No current facility-administered medications for this visit.      PHYSICAL EXAMINATION: ECOG PERFORMANCE STATUS: 0 - Asymptomatic Vitals:   10/21/19 0936  BP: 128/62  Pulse: 64  Resp: 18  Temp: (!) 97.2 F (36.2 C)   Filed Weights   10/21/19 0936  Weight: 212 lb 12.8 oz (96.5 kg)    Physical Exam Constitutional:      General: He is not in acute distress. HENT:     Head: Normocephalic and atraumatic.  Eyes:     General: No scleral icterus.    Pupils: Pupils are equal, round, and reactive to light.  Neck:     Musculoskeletal: Normal range of motion and neck supple.  Cardiovascular:     Rate and Rhythm: Normal rate and regular rhythm.     Heart sounds: Normal heart sounds.  Pulmonary:     Effort: Pulmonary effort is normal. No respiratory distress.     Breath sounds: No wheezing.  Abdominal:     General: Bowel sounds are normal. There is no distension.     Palpations: Abdomen is soft. There is no mass.     Tenderness: There is  no abdominal tenderness.  Musculoskeletal: Normal range of motion.        General: No deformity.  Skin:    General: Skin is warm and dry.     Findings: No erythema or rash.  Neurological:     Mental Status: He is alert and oriented to person, place, and time.     Cranial Nerves: No cranial nerve deficit.     Coordination: Coordination normal.    Psychiatric:        Behavior: Behavior normal.        Thought Content: Thought content normal.      LABORATORY DATA:  I have reviewed the data as listed Lab Results  Component Value Date   WBC 4.8 10/21/2019   HGB 9.0 (L) 10/21/2019   HCT 26.9 (L) 10/21/2019   MCV 83.8 10/21/2019   PLT 160 10/21/2019   Recent Labs    10/21/19 0956  NA 127*  K 4.3  CL 93*  CO2 24  GLUCOSE 129*  BUN 20  CREATININE 1.44*  CALCIUM 8.7*  GFRNONAA 49*  GFRAA 56*  PROT 7.1  ALBUMIN 3.7  AST 15  ALT 14  ALKPHOS 60  BILITOT 0.7   Iron/TIBC/Ferritin/ %Sat    Component Value Date/Time   IRON 38 (L) 10/21/2019 0956   TIBC 400 10/21/2019 0956   FERRITIN 10 (L) 10/21/2019 0956   IRONPCTSAT 10 (L) 10/21/2019 0956     RADIOGRAPHIC STUDIES: I have personally reviewed the radiological images as listed and agreed with the findings in the report. Ct Chest Wo Contrast  Result Date: 08/05/2019 CLINICAL DATA:  Follow-up new 4 mm pulmonary nodule EXAM: CT CHEST WITHOUT CONTRAST TECHNIQUE: Multidetector CT imaging of the chest was performed following the standard protocol without IV contrast. COMPARISON:  None. FINDINGS: Cardiovascular: Heart is normal in size.  No pericardial effusion. No evidence of thoracic aortic aneurysm. Atherosclerotic calcifications of the aortic arch. Three vessel coronary atherosclerosis. Postsurgical changes related to prior CABG. Mediastinum/Nodes: Small mediastinal lymph nodes, including 11 mm short axis subcarinal node, within normal limits. Visualized thyroid is unremarkable. Lungs/Pleura: Mild ground-glass peribronchovascular opacity predominantly in the right upper lobe (series 3/image 39), new, suggesting mild infection. 3 mm nodule in the central left upper lobe (series 3/image 48), corresponding to the prior 4 mm ground-glass nodule, stable versus mildly improved. Additional bilateral solid pulmonary nodules, including a dominant 8 mm nodule in the right lower lobe  (series 3/image 113), unchanged, benign. Mild subpleural reticulation/fibrosis the bilateral lung bases. No focal consolidation. No pleural effusion or pneumothorax. Upper Abdomen: Visualized upper abdomen is notable for a mildly nodular hepatic contour, suspected mild splenomegaly, and vascular calcifications. Musculoskeletal: Mild degenerative changes of the visualized thoracolumbar spine. Median sternotomy. IMPRESSION: Mild ground-glass peribronchovascular opacity predominantly in the right upper lobe, new, suggesting mild infection. Additional bilateral pulmonary nodules are grossly unchanged, benign. Aortic Atherosclerosis (ICD10-I70.0). Electronically Signed   By: Julian Hy M.D.   On: 08/05/2019 09:37      ASSESSMENT & PLAN:  1. Normocytic anemia   2. Hyponatremia   3. AKI (acute kidney injury) ([place])   4. Iron deficiency anemia, unspecified iron deficiency anemia type    Anemia: multifactorial with possible causes including chronic blood loss, hyper/hypothyroidism, nutritional deficiency, infection/chronic inflammation, hemolysis, underlying bone marrow disorders. Will check CBC w differential, CMP, vitamin B12, Folate, iron/TIBC, ferritin, reticulocytes, fecal occult, blood smear, TSH,  LDH, haptoglobin, monoclonal gammopathy evaluation.    #Labs reviewed.  CBC CMP iron panel results were available  after the clinic. Iron deficiency anemia, patient will need to be started on oral iron supplementation.  We will discuss about IV iron options at the next visit.  I called patient and sent prescription of oral iron supplementation ferrous sulfate 325 mg twice daily with meals Advised stool softener over-the-counter if constipation. #Hyponatremia, chronicity unknown.  Called patient and the patient reports additional information.  He has a history of hyponatremia and had been worked up before.  Was told to take salty food and hyponatremia improved.  He resumed a low salt diet recently.   Recent CT chest without contrast did not show any lung mass. Also is on Lasix 40 mg daily for CHF/cardiomyopathy.  Creatinine trended up to 1.44.  Advised patient to back off to 20 mg daily.  I will also notify patient's cardiologist Dr. Rockey Situ  Orders Placed This Encounter  Procedures   CBC with Differential/Platelet    Standing Status:   Future    Number of Occurrences:   1    Standing Expiration Date:   10/20/2020   Comprehensive metabolic panel    Standing Status:   Future    Number of Occurrences:   1    Standing Expiration Date:   10/20/2020   Retic Panel    Standing Status:   Future    Number of Occurrences:   1    Standing Expiration Date:   10/20/2020   TSH    Standing Status:   Future    Number of Occurrences:   1    Standing Expiration Date:   10/20/2020   Iron and TIBC    Standing Status:   Future    Number of Occurrences:   1    Standing Expiration Date:   10/20/2020   Ferritin    Standing Status:   Future    Number of Occurrences:   1    Standing Expiration Date:   10/20/2020   Multiple Myeloma Panel (SPEP&IFE w/QIG)    Standing Status:   Future    Number of Occurrences:   1    Standing Expiration Date:   10/20/2020   Kappa/lambda light chains    Standing Status:   Future    Number of Occurrences:   1    Standing Expiration Date:   10/20/2020   Technologist smear review    Standing Status:   Future    Number of Occurrences:   1    Standing Expiration Date:   10/20/2020    All questions were answered. The patient knows to call the clinic with any problems questions or concerns. Cc. Burns, Harriett Mamie Nick, MD  Return of visit: 2 weeks Thank you for this kind referral and the opportunity to participate in the care of this patient. A copy of today's note is routed to referring provider  Total face to face encounter time for this patient visit was 60 min. >50% of the time was  spent in counseling and coordination of care.    Earlie Server, MD, PhD 10/21/2019

## 2019-10-22 ENCOUNTER — Telehealth: Payer: Self-pay | Admitting: Cardiovascular Disease

## 2019-10-22 ENCOUNTER — Telehealth: Payer: Self-pay

## 2019-10-22 DIAGNOSIS — I214 Non-ST elevation (NSTEMI) myocardial infarction: Secondary | ICD-10-CM

## 2019-10-22 LAB — MULTIPLE MYELOMA PANEL, SERUM
Albumin SerPl Elph-Mcnc: 3.8 g/dL (ref 2.9–4.4)
Albumin/Glob SerPl: 1.3 (ref 0.7–1.7)
Alpha 1: 0.2 g/dL (ref 0.0–0.4)
Alpha2 Glob SerPl Elph-Mcnc: 0.8 g/dL (ref 0.4–1.0)
B-Globulin SerPl Elph-Mcnc: 1 g/dL (ref 0.7–1.3)
Gamma Glob SerPl Elph-Mcnc: 1.1 g/dL (ref 0.4–1.8)
Globulin, Total: 3.1 g/dL (ref 2.2–3.9)
IgA: 335 mg/dL (ref 61–437)
IgG (Immunoglobin G), Serum: 1118 mg/dL (ref 603–1613)
IgM (Immunoglobulin M), Srm: 173 mg/dL — ABNORMAL HIGH (ref 15–143)
Total Protein ELP: 6.9 g/dL (ref 6.0–8.5)

## 2019-10-22 LAB — KAPPA/LAMBDA LIGHT CHAINS
Kappa free light chain: 42.9 mg/L — ABNORMAL HIGH (ref 3.3–19.4)
Kappa, lambda light chain ratio: 0.87 (ref 0.26–1.65)
Lambda free light chains: 49.4 mg/L — ABNORMAL HIGH (ref 5.7–26.3)

## 2019-10-22 NOTE — Telephone Encounter (Signed)
Received message from hematology oncology concerning elevated creatinine and low sodium on recent lab work  Last note from our clinic April 2020 with decrease Lasix down to 20 daily Would confirm his Lasix dosing.  Hematology felt he was on 40 daily  Would stop the Lasix given acute lab findings renal failure Likely from dehydration He will need to take Lasix very sparingly for ankle swelling given low sodium and worsening renal function consistent with dehydration  Would likely benefit from repeat BMP in 2 to 3 weeks time  thx TGollan

## 2019-10-22 NOTE — Telephone Encounter (Signed)
Will send to scheduler to add *new* Venofer.

## 2019-10-22 NOTE — Telephone Encounter (Signed)
-----   Message from Earlie Server, MD sent at 10/21/2019  5:16 PM EST ----- Please add IV venofer to next visit. Thanks. He is aware.

## 2019-10-22 NOTE — Telephone Encounter (Signed)
Spoke with patient and he reports that medications are filled by mail order pharmacy. He has continued to receive Furosemide 40 mg once daily prescription and that has been the dose he has been taking. He vaguely remembers being told to decrease down to Furosemide 20 mg once daily but forgot about that due to being [place]. Reviewed provider results and recommendations and patient has cut down to 1/2 tablet of the Furosemide 40 mg pill starting today. Given his changes in labs and dose change advised that we will wait for repeat labs to see if additional changes are needed. Patient reports that he goes back to see hematology/oncology provider in 2 weeks to see them and repeat labs. Advised that I would make provider aware that these have been set up and will be in touch if additional changes are needed. Once we see those repeat labs if needed we can send in new prescription for Furosemide 20 mg tablets to take once daily verses having to cut his pills in half. He reports this will be very helpful so that he doesn't forget. Will monitor for repeat labs and further recommendations. Patient verbalized understanding of our conversation, agreement with plan, and had no further questions at this time.

## 2019-10-23 ENCOUNTER — Other Ambulatory Visit: Payer: Self-pay | Admitting: Cardiovascular Disease

## 2019-10-23 NOTE — Telephone Encounter (Signed)
Please advise if OK to refill. Thank you! 

## 2019-10-31 ENCOUNTER — Telehealth: Payer: Self-pay | Admitting: Cardiovascular Disease

## 2019-10-31 NOTE — Telephone Encounter (Signed)
°*  STAT* If patient is at the pharmacy, call can be transferred to refill team.   1. Which medications need to be refilled? (please list name of each medication and dose if known)   Meclizine 25 mg po BID prn   2. Which pharmacy/location (including street and city if local pharmacy) is medication to be sent to?  [facility] mail order   3. Do they need a 30 day or 90 day supply? [place]

## 2019-11-03 NOTE — Telephone Encounter (Signed)
Please review for refill on Meclizine.

## 2019-11-04 ENCOUNTER — Other Ambulatory Visit: Payer: Self-pay

## 2019-11-04 ENCOUNTER — Encounter: Payer: Self-pay | Admitting: Oncology

## 2019-11-04 ENCOUNTER — Ambulatory Visit: Payer: Medicare PPO | Admitting: Oncology

## 2019-11-04 ENCOUNTER — Inpatient Hospital Stay: Payer: Medicare PPO

## 2019-11-04 ENCOUNTER — Inpatient Hospital Stay: Payer: Medicare PPO | Admitting: Oncology

## 2019-11-04 VITALS — BP 132/69 | HR 65 | Temp 97.7°F | Resp 18 | Wt 212.8 lb

## 2019-11-04 VITALS — BP 122/61 | HR 62 | Resp 18

## 2019-11-04 DIAGNOSIS — N179 Acute kidney failure, unspecified: Secondary | ICD-10-CM | POA: Diagnosis not present

## 2019-11-04 DIAGNOSIS — D509 Iron deficiency anemia, unspecified: Secondary | ICD-10-CM

## 2019-11-04 DIAGNOSIS — E871 Hypo-osmolality and hyponatremia: Secondary | ICD-10-CM

## 2019-11-04 DIAGNOSIS — D649 Anemia, unspecified: Secondary | ICD-10-CM | POA: Diagnosis not present

## 2019-11-04 MED ORDER — FERROUS SULFATE 325 (65 FE) MG PO TABS: 325.0000 mg | ORAL_TABLET | Freq: Every day | ORAL | 0 refills | Status: DC

## 2019-11-04 MED ORDER — SODIUM CHLORIDE 0.9 % IV SOLN
Freq: Once | INTRAVENOUS | Status: AC
  Administered 2019-11-04: 14:00:00 via INTRAVENOUS
  Filled 2019-11-04: qty 250

## 2019-11-04 MED ORDER — IRON SUCROSE 20 MG/ML IV SOLN
200.0000 mg | Freq: Once | INTRAVENOUS | Status: AC
  Administered 2019-11-04: 200 mg via INTRAVENOUS
  Filled 2019-11-04: qty 10

## 2019-11-04 NOTE — Progress Notes (Signed)
Patient does not offer any problems today.  

## 2019-11-04 NOTE — Progress Notes (Signed)
Hematology/Oncology Consult note [facility] Telephone:([phone] [phone]   Patient Care Team: Ellamae Sia, MD as PCP - General (Internal Medicine)  REFERRING PROVIDER: Ellamae Sia, MD  CHIEF COMPLAINTS/REASON FOR VISIT:  Evaluation of anemia  HISTORY OF PRESENTING ILLNESS:  Kenneth Duarte is a  73 y.o.  male with PMH listed below who was referred to me for evaluation of anemia Reviewed patient's recent labs that was done at [facility] clinic.  /28/2020 hemoglobin 9.7, hematocrit 29, MCV 86, platelet 179 ,000 vitamin B12 462, folate 4.6 Reviewed patient's previous labs, anemia is chronic onset , duration is since 2018 with baseline 9-10 recently. No aggravating or improving factors.  Associated signs and symptoms:  He feels pretty well.  No profound fatigue or shortness of breath.  He only feels minimal shortness of breath after climbing a flight of stairs. Denies weight loss, easy bruising, hematochezia, hemoptysis, hematuria. Context: History of GI bleeding: Denies               History of Chronic kidney disease: Denies               History of autoimmune disease: Denies               History of hemolytic anemia.  Denies               Last colonoscopy: 2016-sigmoid polyp resected  period.  He follows up with cardiology for CAD, CABG x3 Denies weight loss, fever, chills, fatigue, night sweats.   INTERVAL HISTORY Kenneth Duarte is a 73 y.o. male who has above history reviewed by me today presents for follow up visit for management of anemia Problems and complaints are listed below: During the interval, patient has had lab work-up done. Denies any new complaints. Patient has been advised to start on oral iron supplementation.  Reports iron supplementation causes constipation. Chronic fatigue, at baseline.  Review of Systems  Constitutional: Positive for fatigue. Negative for appetite change, chills, fever and  unexpected weight change.  HENT:   Negative for hearing loss and voice change.   Eyes: Negative for eye problems and icterus.  Respiratory: Negative for chest tightness, cough and shortness of breath.   Cardiovascular: Negative for chest pain and leg swelling.  Gastrointestinal: Negative for abdominal distention and abdominal pain.  Endocrine: Negative for hot flashes.  Genitourinary: Negative for difficulty urinating, dysuria and frequency.   Musculoskeletal: Negative for arthralgias.  Skin: Negative for itching and rash.  Neurological: Negative for light-headedness and numbness.  Hematological: Negative for adenopathy. Does not bruise/bleed easily.  Psychiatric/Behavioral: Negative for confusion.     MEDICAL HISTORY:  Past Medical History:  Diagnosis Date  . Arthritis   . Chronic pain syndrome   . Congestive heart failure ([place]) 1980  . Coronary artery disease    a. s/p 3V CABG (03/2017). b. NSTEMI 06/2017 with progressive graft disease, s/p DES to SVG-OM.  . Diabetes ([place])    DIET  . Dyspnea    with exertion  . ED (erectile dysfunction)   . GERD (gastroesophageal reflux disease)   . Gout   . Headache   . Hyperlipemia   . Hypertension    CONTROLLED ON MEDS  . IDA (iron deficiency anemia) 10/21/2019  . Neuropathy of both feet   . Seborrheic keratosis   . Sinus congestion   . Wears dentures    PARTIAL UPPER    SURGICAL HISTORY: Past Surgical History:  Procedure Laterality  Date  . COLONOSCOPY WITH PROPOFOL N/A 10/25/2015   Procedure: COLONOSCOPY WITH PROPOFOL;  Surgeon: Lucilla Lame, MD;  Location: [place];  Service: Endoscopy;  Laterality: N/A;  DIABETIC-ORAL MEDS  . CORONARY ARTERY BYPASS GRAFT N/A 04/10/2017   Procedure: CORONARY ARTERY BYPASS GRAFTING (CABG) x three , using left internal mammary artery and right leg greater saphenous vein harvested endoscopically;  Surgeon: Ivin Poot, MD;  Location: [place];  Service: Open Heart Surgery;  Laterality: N/A;   . CORONARY ARTERY BYPASS GRAFT    . CORONARY STENT INTERVENTION N/A 06/19/2017   Procedure: Coronary Stent Intervention;  Surgeon: Jettie Booze, MD;  Location: [place] [facility];  Service: Cardiovascular;  Laterality: N/A;  . KNEE SURGERY Right    over 20 years ago  . LEFT HEART CATH AND CORONARY ANGIOGRAPHY Left 02/22/2017   Procedure: Left Heart Cath and Coronary Angiography;  Surgeon: Corey Skains, MD;  Location: [place] [facility];  Service: Cardiovascular;  Laterality: Left;  . LEFT HEART CATH AND CORS/GRAFTS ANGIOGRAPHY N/A 06/18/2017   Procedure: Left Heart Cath and Cors/Grafts Angiography and PCI;  Surgeon: Isaias Cowman, MD;  Location: [place] [facility];  Service: Cardiovascular;  Laterality: N/A;  . POLYPECTOMY  10/25/2015   Procedure: POLYPECTOMY;  Surgeon: Lucilla Lame, MD;  Location: [place];  Service: Endoscopy;;  . ROTATOR CUFF REPAIR Right 2012  . TEE WITHOUT CARDIOVERSION N/A 04/10/2017   Procedure: TRANSESOPHAGEAL ECHOCARDIOGRAM (TEE);  Surgeon: Ivin Poot, MD;  Location: [place];  Service: Open Heart Surgery;  Laterality: N/A;    SOCIAL HISTORY: Social History   Socioeconomic History  . Marital status: Married    Spouse name: Not on file  . Number of children: Not on file  . Years of education: Not on file  . Highest education level: Not on file  Occupational History  . Not on file  Social Needs  . Financial resource strain: Not on file  . Food insecurity    Worry: Not on file    Inability: Not on file  . Transportation needs    Medical: Not on file    Non-medical: Not on file  Tobacco Use  . Smoking status: Former Smoker    Packs/day: 2.00    Years: 15.00    Pack years: 30.00    Types: Cigarettes    Quit date: 06/30/1979    Years since quitting: 40.3  . Smokeless tobacco: Never Used  Substance and Sexual Activity  . Alcohol use: No    Alcohol/week: 0.0 standard drinks    Comment: QUIT IN 1980  . Drug use: No  . Sexual  activity: Not on file  Lifestyle  . Physical activity    Days per week: Not on file    Minutes per session: Not on file  . Stress: Not on file  Relationships  . Social Herbalist on phone: Not on file    Gets together: Not on file    Attends religious service: Not on file    Active member of club or organization: Not on file    Attends meetings of clubs or organizations: Not on file    Relationship status: Not on file  . Intimate partner violence    Fear of current or ex partner: Not on file    Emotionally abused: Not on file    Physically abused: Not on file    Forced sexual activity: Not on file  Other Topics Concern  .  Not on file  Social History Narrative  . Not on file    FAMILY HISTORY: Family History  Problem Relation Age of Onset  . Cancer Father   . Heart disease Father   . Heart attack Father   . Cancer Mother   . Heart disease Brother   . Diabetes Brother   . Prostate cancer Brother   . Diabetes Sister   . Bladder Cancer Neg Hx   . Kidney disease Neg Hx     ALLERGIES:  is allergic to blood-group specific substance and cortisone acetate [cortisone].  MEDICATIONS:  Current Outpatient Medications  Medication Sig Dispense Refill  . acetaminophen (TYLENOL) 500 MG tablet Take 2 tablets (1,000 mg total) by mouth every 6 (six) hours as needed. (Patient taking differently: Take 1,000 mg by mouth every 6 (six) hours as needed for mild pain, fever or headache. ) 30 tablet 0  . albuterol (PROVENTIL HFA;VENTOLIN HFA) 108 (90 Base) MCG/ACT inhaler Inhale into the lungs.    Marland Kitchen amLODipine-benazepril (LOTREL) 5-20 MG capsule     . aspirin 81 MG tablet Take 1 tablet (81 mg total) by mouth daily. 90 tablet 1  . atorvastatin (LIPITOR) 80 MG tablet TAKE 1 TABLET AT BEDTIME 90 tablet 2  . carvedilol (COREG) 12.5 MG tablet Take 1 tablet (12.5 mg total) by mouth 2 (two) times daily with a meal. 180 tablet 2  . docusate sodium (COLACE) 100 MG capsule Take 100 mg by mouth  2 (two) times daily as needed.     . ferrous sulfate 325 (65 FE) MG tablet Take 1 tablet (325 mg total) by mouth daily. 90 tablet 0  . fluticasone (FLONASE) 50 MCG/ACT nasal spray Place into the nose.    . furosemide (LASIX) 20 MG tablet Take 1 tablet (20 mg total) by mouth daily. (Patient taking differently: Take 20 mg by mouth daily. 2 tabs QAM)    . gabapentin (NEURONTIN) 800 MG tablet Take 1,600 mg by mouth 3 (three) times daily.    . IBU 800 MG tablet     . meclizine (ANTIVERT) 25 MG tablet TAKE 1 TABLET (25 MG TOTAL) BY MOUTH 2 (TWO) TIMES DAILY AS NEEDED FOR DIZZINESS. 180 tablet 1  . metFORMIN (GLUCOPHAGE-XR) 500 MG 24 hr tablet Take 500 mg by mouth 2 (two) times daily.     . nitroGLYCERIN (NITROSTAT) 0.4 MG SL tablet Place 1 tablet (0.4 mg total) under the tongue every 5 (five) minutes as needed for chest pain. 25 tablet 3  . oxyCODONE-acetaminophen (PERCOCET) 10-325 MG tablet Take 1 tablet by mouth every 4 (four) hours as needed for pain. 40 tablet 0  . pantoprazole (PROTONIX) 40 MG tablet TAKE 1 TABLET EVERY DAY 90 tablet 0  . vitamin B-12 (CYANOCOBALAMIN) 1000 MCG tablet      No current facility-administered medications for this visit.      PHYSICAL EXAMINATION: ECOG PERFORMANCE STATUS: 0 - Asymptomatic Vitals:   11/04/19 1303  BP: 132/69  Pulse: 65  Resp: 18  Temp: 97.7 F (36.5 C)   Filed Weights   11/04/19 1303  Weight: 212 lb 12.8 oz (96.5 kg)    Physical Exam Constitutional:      General: He is not in acute distress. HENT:     Head: Normocephalic and atraumatic.  Eyes:     General: No scleral icterus.    Pupils: Pupils are equal, round, and reactive to light.  Neck:     Musculoskeletal: Normal range of motion and neck supple.  Cardiovascular:     Rate and Rhythm: Normal rate and regular rhythm.     Heart sounds: Normal heart sounds.  Pulmonary:     Effort: Pulmonary effort is normal. No respiratory distress.     Breath sounds: No wheezing.  Abdominal:      General: Bowel sounds are normal. There is no distension.     Palpations: Abdomen is soft. There is no mass.     Tenderness: There is no abdominal tenderness.  Musculoskeletal: Normal range of motion.        General: No deformity.  Skin:    General: Skin is warm and dry.     Findings: No erythema or rash.  Neurological:     Mental Status: He is alert and oriented to person, place, and time.     Cranial Nerves: No cranial nerve deficit.     Coordination: Coordination normal.  Psychiatric:        Behavior: Behavior normal.        Thought Content: Thought content normal.      LABORATORY DATA:  I have reviewed the data as listed Lab Results  Component Value Date   WBC 4.8 10/21/2019   HGB 9.0 (L) 10/21/2019   HCT 26.9 (L) 10/21/2019   MCV 83.8 10/21/2019   PLT 160 10/21/2019   Recent Labs    10/21/19 0956  NA 127*  K 4.3  CL 93*  CO2 24  GLUCOSE 129*  BUN 20  CREATININE 1.44*  CALCIUM 8.7*  GFRNONAA 49*  GFRAA 56*  PROT 7.1  ALBUMIN 3.7  AST 15  ALT 14  ALKPHOS 60  BILITOT 0.7   Iron/TIBC/Ferritin/ %Sat    Component Value Date/Time   IRON 38 (L) 10/21/2019 0956   TIBC 400 10/21/2019 0956   FERRITIN 10 (L) 10/21/2019 0956   IRONPCTSAT 10 (L) 10/21/2019 0956     RADIOGRAPHIC STUDIES: I have personally reviewed the radiological images as listed and agreed with the findings in the report. No results found.    ASSESSMENT & PLAN:  1. Iron deficiency anemia, unspecified iron deficiency anemia type   2. Normocytic anemia   3. Hyponatremia   4. AKI (acute kidney injury) [facility])   Labs are reviewed and discussed with patient. Work-up is consistent with iron deficiency anemia. Patient has been started on oral iron supplementation.  Ferrous sulfate 325 mg twice daily. Iron supplementation causes constipation. I discussed with patient about options of IV iron. Plan IV iron with Venofer 200mg  weekly x 3 doses. Allergy reactions/infusion reaction including  anaphylactic reaction discussed with patient. Other side effects include but not limited to high blood pressure, skin rash, weight gain, leg swelling, etc. Patient voices understanding and willing to proceed. He will proceed with first dose of IV iron treatment, followed by weekly x2 I discussed about holding oral iron supplementation when he is receiving IV iron treatments. Afterwards he may resume ferrous sulfate 325 mg daily.  Refills sent to pharmacy.  #Last colonoscopy was done on 10/25/2015 which showed sigmoid polyps. Will refer patient to see Dr. Allen Norris for evaluation of iron deficiency anemia.  #Hyponatremia/AKI, likely due to overdiuresis.  I have contacted patient's cardiologist Dr. Rockey Situ who has decreased patient's Lasix to 20 mg.  Advised patient to continue follow-up with primary care and cardiology for hyponatremia and creatinine level.   Orders Placed This Encounter  Procedures  . CBC with Differential/Platelet    Standing Status:   Future    Standing Expiration Date:  11/03/2020  . Ferritin    Standing Status:   Future    Standing Expiration Date:   11/03/2020  . Iron and TIBC    Standing Status:   Future    Standing Expiration Date:   11/03/2020    All questions were answered. The patient knows to call the clinic with any problems questions or concerns. Cc. Ellamae Sia, MD  Return of visit: 3 months Earlie Server, MD, PhD 11/04/2019

## 2019-11-05 ENCOUNTER — Other Ambulatory Visit: Payer: Self-pay

## 2019-11-05 DIAGNOSIS — D509 Iron deficiency anemia, unspecified: Secondary | ICD-10-CM

## 2019-11-06 NOTE — Telephone Encounter (Signed)
Can we have him complete a bmp at his convenience

## 2019-11-07 NOTE — Addendum Note (Signed)
Addended by: Valora Corporal on: 11/07/2019 03:01 PM   Modules accepted: Orders

## 2019-11-07 NOTE — Telephone Encounter (Signed)
Spoke with patient and reviewed that we would like him to have some labs (BMP) [facility]. He was agreeable to proceed there for labs when possible with no further questions at this time.

## 2019-11-10 ENCOUNTER — Other Ambulatory Visit
Admission: RE | Admit: 2019-11-10 | Discharge: 2019-11-10 | Disposition: A | Discharge status: Home / Self Care | Payer: Medicare PPO | Source: Ambulatory Visit | Attending: Cardiovascular Disease | Admitting: Cardiovascular Disease

## 2019-11-10 ENCOUNTER — Telehealth: Payer: Self-pay

## 2019-11-10 DIAGNOSIS — I214 Non-ST elevation (NSTEMI) myocardial infarction: Secondary | ICD-10-CM | POA: Insufficient documentation

## 2019-11-10 DIAGNOSIS — D649 Anemia, unspecified: Secondary | ICD-10-CM

## 2019-11-10 DIAGNOSIS — D509 Iron deficiency anemia, unspecified: Secondary | ICD-10-CM

## 2019-11-10 LAB — BASIC METABOLIC PANEL
Anion gap: 9 (ref 5–15)
BUN: 14 mg/dL (ref 8–23)
CO2: 23 mmol/L (ref 22–32)
Calcium: 8.7 mg/dL — ABNORMAL LOW (ref 8.9–10.3)
Chloride: 98 mmol/L (ref 98–111)
Creatinine, Ser: 1.04 mg/dL (ref 0.61–1.24)
GFR calc Af Amer: >60 mL/min (ref >60)
GFR calc non Af Amer: >60 mL/min (ref >60)
Glucose, Bld: 185 mg/dL — ABNORMAL HIGH (ref 70–99)
Potassium: 5 mmol/L (ref 3.5–5.1)
Sodium: 130 mmol/L — ABNORMAL LOW (ref 135–145)

## 2019-11-10 LAB — IRON AND TIBC
Iron: 58 ug/dL (ref 45–182)
Saturation Ratios: 17 % — ABNORMAL LOW (ref 17.9–39.5)
TIBC: 348 ug/dL (ref 250–450)
UIBC: 291 ug/dL

## 2019-11-10 NOTE — Telephone Encounter (Signed)
GI referral entered and confirmed with Dr. Dorothey Baseman office, [facility], that referral received.

## 2019-11-10 NOTE — Addendum Note (Signed)
Addended by: Vanice Sarah on: 11/10/2019 04:05 PM   Modules accepted: Orders

## 2019-11-10 NOTE — Telephone Encounter (Signed)
-----   Message from Vanice Sarah, [place] sent at 11/05/2019  4:20 PM EST ----- Regarding: GI referral Referral entered ----- Message ----- From: Earlie Server, MD Sent: 11/04/2019   9:08 PM EST To: Evelina Dun, RN, Vanice Sarah, CMA  Please refer patient to gastroenterology Dr. Allen Norris for evaluation of iron deficiency anemia. Patient is aware.

## 2019-11-13 ENCOUNTER — Other Ambulatory Visit: Payer: Self-pay | Admitting: Cardiovascular Disease

## 2019-11-16 ENCOUNTER — Other Ambulatory Visit: Payer: Self-pay | Admitting: Oncology

## 2019-11-17 ENCOUNTER — Other Ambulatory Visit: Payer: Self-pay

## 2019-11-17 ENCOUNTER — Telehealth: Payer: Self-pay | Admitting: Cardiovascular Disease

## 2019-11-17 NOTE — Telephone Encounter (Signed)
Patient returning call.

## 2019-11-17 NOTE — Telephone Encounter (Signed)
Please call to discuss lab results 

## 2019-11-17 NOTE — Telephone Encounter (Signed)
*  STAT* If patient is at the pharmacy, call can be transferred to refill team.   1. Which medications need to be refilled? (please list name of each medication and dose if known)  Meclizine  2. Which pharmacy/location (including street and city if local pharmacy) is medication to be sent to? [facility]  3. Do they need a 30 day or 90 day supply? [place]

## 2019-11-17 NOTE — Telephone Encounter (Signed)
Left voicemail message to call back  

## 2019-11-18 ENCOUNTER — Inpatient Hospital Stay: Payer: Medicare PPO | Attending: Oncology

## 2019-11-18 ENCOUNTER — Other Ambulatory Visit: Payer: Self-pay

## 2019-11-18 VITALS — BP 121/52 | HR 64 | Resp 18

## 2019-11-18 DIAGNOSIS — E871 Hypo-osmolality and hyponatremia: Secondary | ICD-10-CM | POA: Insufficient documentation

## 2019-11-18 DIAGNOSIS — Z79899 Other long term (current) drug therapy: Secondary | ICD-10-CM | POA: Insufficient documentation

## 2019-11-18 DIAGNOSIS — N179 Acute kidney failure, unspecified: Secondary | ICD-10-CM | POA: Diagnosis not present

## 2019-11-18 DIAGNOSIS — D649 Anemia, unspecified: Secondary | ICD-10-CM | POA: Diagnosis present

## 2019-11-18 DIAGNOSIS — D509 Iron deficiency anemia, unspecified: Secondary | ICD-10-CM | POA: Insufficient documentation

## 2019-11-18 MED ORDER — MECLIZINE HCL 25 MG PO TABS: 25.0000 mg | ORAL_TABLET | Freq: Two times a day (BID) | ORAL | 3 refills | Status: DC | PRN

## 2019-11-18 MED ORDER — SODIUM CHLORIDE 0.9 % IV SOLN
Freq: Once | INTRAVENOUS | Status: AC
  Administered 2019-11-18: 14:00:00 via INTRAVENOUS
  Filled 2019-11-18: qty 250

## 2019-11-18 MED ORDER — IRON SUCROSE 20 MG/ML IV SOLN
200.0000 mg | Freq: Once | INTRAVENOUS | Status: AC
  Administered 2019-11-18: 200 mg via INTRAVENOUS
  Filled 2019-11-18: qty 10

## 2019-11-18 MED ORDER — FUROSEMIDE 20 MG PO TABS: 20.0000 mg | ORAL_TABLET | Freq: Every day | ORAL | 3 refills | Status: DC

## 2019-11-18 NOTE — Telephone Encounter (Signed)
Spoke with patient and reviewed that results are pending provider review. Advised that preliminary results were stable and that once he reviews we would be in touch with those results. He states that he needs refills and sent those in to his mail order pharmacy. He was appreciative for the call with no further questions at this time.

## 2019-11-24 ENCOUNTER — Other Ambulatory Visit: Payer: Self-pay

## 2019-11-24 ENCOUNTER — Telehealth: Payer: Self-pay | Admitting: Cardiovascular Disease

## 2019-11-24 ENCOUNTER — Inpatient Hospital Stay: Payer: Medicare PPO

## 2019-11-24 NOTE — Telephone Encounter (Signed)
Patient states that he has not been taking his plavix and called in to clarify if he should still be on this. Reviewed chart and I can not determine when it was stopped. I do not see it on his current list. He did have previous cath with interventions done and his pharmacy was going to fill it but he wanted to check with [place] first. Advised I would route to provider and then be in touch with him. He verbalized understanding with no further questions at this time.

## 2019-11-24 NOTE — Telephone Encounter (Signed)
Would stay on plavix with asa 81 mg daily

## 2019-11-24 NOTE — Telephone Encounter (Signed)
Pt c/o medication issue:  1. Name of Medication: plavix   2. How are you currently taking this medication (dosage and times per day)? Not taking   3. Are you having a reaction (difficulty breathing--STAT)? No   4. What is your medication issue? Pharmacy called patient to discuss.  Patient states he has not been taking but wants to make sure this is correct.  Please call to discuss.

## 2019-11-25 ENCOUNTER — Other Ambulatory Visit: Payer: Self-pay

## 2019-11-25 ENCOUNTER — Inpatient Hospital Stay: Payer: Medicare PPO

## 2019-11-25 VITALS — BP 126/68 | HR 69 | Temp 98.7°F | Resp 18

## 2019-11-25 DIAGNOSIS — D649 Anemia, unspecified: Secondary | ICD-10-CM | POA: Diagnosis not present

## 2019-11-25 DIAGNOSIS — D509 Iron deficiency anemia, unspecified: Secondary | ICD-10-CM

## 2019-11-25 MED ORDER — CLOPIDOGREL BISULFATE 75 MG PO TABS: 75.0000 mg | ORAL_TABLET | Freq: Every day | ORAL | 3 refills | Status: DC

## 2019-11-25 MED ORDER — SODIUM CHLORIDE 0.9 % IV SOLN
Freq: Once | INTRAVENOUS | Status: AC
  Administered 2019-11-25: 14:00:00 via INTRAVENOUS
  Filled 2019-11-25: qty 250

## 2019-11-25 MED ORDER — IRON SUCROSE 20 MG/ML IV SOLN
200.0000 mg | Freq: Once | INTRAVENOUS | Status: AC
  Administered 2019-11-25: 14:00:00 200 mg via INTRAVENOUS
  Filled 2019-11-25: qty 10

## 2019-11-25 NOTE — Telephone Encounter (Signed)
Call to patient to update him on message from Dr. Rockey Situ that he wants pt tp continue taking plavix and asa.   Updated RX to pt preferred pharmacy on file.   Left detailed message. Advised pt to call for any further questions or concerns.

## 2019-12-02 ENCOUNTER — Other Ambulatory Visit: Payer: Self-pay | Admitting: Cardiovascular Disease

## 2019-12-02 MED ORDER — CLOPIDOGREL BISULFATE 75 MG PO TABS: 75.0000 mg | ORAL_TABLET | Freq: Every day | ORAL | 1 refills | Status: DC

## 2019-12-02 NOTE — Telephone Encounter (Signed)
*  STAT* If patient is at the pharmacy, call can be transferred to refill team.   1. Which medications need to be refilled? (please list name of each medication and dose if known) plavix 75 mg daily  2. Which pharmacy/location (including street and city if local pharmacy) is medication to be sent to? [facility] order  3. Do they need a 30 day or 90 day supply? [place]

## 2019-12-02 NOTE — Telephone Encounter (Signed)
Requested Prescriptions   Signed Prescriptions Disp Refills   clopidogrel (PLAVIX) 75 MG tablet 90 tablet 1    Sig: Take 1 tablet (75 mg total) by mouth daily.    Authorizing Provider: GOLLAN, TIMOTHY J    Ordering User: NEWCOMER MCCLAIN, Tira Lafferty L    

## 2019-12-10 ENCOUNTER — Other Ambulatory Visit: Payer: Self-pay | Admitting: Cardiovascular Disease

## 2019-12-22 ENCOUNTER — Other Ambulatory Visit: Payer: Self-pay

## 2019-12-22 ENCOUNTER — Ambulatory Visit: Payer: Medicare PPO | Admitting: Podiatry

## 2019-12-22 ENCOUNTER — Encounter: Payer: Self-pay | Admitting: Podiatry

## 2019-12-22 DIAGNOSIS — E1142 Type 2 diabetes mellitus with diabetic polyneuropathy: Secondary | ICD-10-CM

## 2019-12-22 DIAGNOSIS — Z89422 Acquired absence of other left toe(s): Secondary | ICD-10-CM

## 2019-12-22 DIAGNOSIS — E1140 Type 2 diabetes mellitus with diabetic neuropathy, unspecified: Secondary | ICD-10-CM | POA: Insufficient documentation

## 2019-12-22 NOTE — Addendum Note (Signed)
Addended by: Gardiner Barefoot on: 12/22/2019 09:45 AM   Modules accepted: Level of Service

## 2019-12-22 NOTE — Progress Notes (Signed)
This patient presents to the office stating that he is diabetic and is here for his annual foot exam as well as requesting diabetic foot gear.  Patient states that he had an infection in his second toe left foot which caused Dr. From [facility]. 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  He says he has a broken second toe right foot x 1 week.  He says that he had developed an infection in the bone.  He presents the office today for a diabetic foot exam and requests diabetic shoes.  Vascular  Dorsalis pedis and posterior tibial pulses are weakly  palpable  B/L.  Capillary return  WNL.  Temperature gradient is  WNL.  Skin turgor  WNL  Scant hair  B/L.  Sensorium  Senn Weinstein monofilament wire  Absent . Absent  tactile sensation.  Nail Exam  Patient has normal nails with no evidence of bacterial or fungal infection.  Orthopedic  Exam  Muscle tone and muscle strength  WNL.  No limitations of motion feet  B/L.  No crepitus or joint effusion noted.  Foot type is unremarkable and digits show no abnormalities.  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  Skin  No open lesions.  Normal skin texture and turgor.  Diabetic neuropathy  B/L  Amputation second toe left foot.  ROV.  Diabetic foot exam performed and he has vascular pathology with absent  LOPS.    Patient to see Liliane Channel later this months.  RTC prn   Gardiner Barefoot DPM

## 2019-12-26 ENCOUNTER — Other Ambulatory Visit: Payer: Self-pay | Admitting: Cardiovascular Disease

## 2019-12-31 ENCOUNTER — Other Ambulatory Visit: Payer: Self-pay | Admitting: Oncology

## 2020-01-07 ENCOUNTER — Other Ambulatory Visit: Payer: Self-pay

## 2020-01-07 ENCOUNTER — Ambulatory Visit: Payer: Medicare PPO | Admitting: Orthotics

## 2020-01-07 DIAGNOSIS — M2041 Other hammer toe(s) (acquired), right foot: Secondary | ICD-10-CM

## 2020-01-07 DIAGNOSIS — Z89422 Acquired absence of other left toe(s): Secondary | ICD-10-CM

## 2020-01-07 DIAGNOSIS — M2042 Other hammer toe(s) (acquired), left foot: Secondary | ICD-10-CM

## 2020-01-07 DIAGNOSIS — E1142 Type 2 diabetes mellitus with diabetic polyneuropathy: Secondary | ICD-10-CM

## 2020-01-07 NOTE — Progress Notes (Signed)

## 2020-01-12 ENCOUNTER — Telehealth: Payer: Self-pay

## 2020-01-12 NOTE — Telephone Encounter (Signed)
To Dr. Gollan to review.  

## 2020-01-12 NOTE — Telephone Encounter (Signed)
Per [facility] pharmacy, amlodipine/benazepril 5-20mg  is being prescribed by patient's PCP, Dr. Quay Burow. Last filled on 11/20/2020. Our office sent in a prescription refill for lisinopril 20mg  daily on 12/26/2019.Patient states he is taking both medications. Pharmacy is requesting verification that this is OK and if not which one he should be taking. Please advise.

## 2020-01-14 NOTE — Telephone Encounter (Signed)
Spoke with patient and he reported taking amlodipine 5 mg once daily and lisinopril 20 mg once daily. He looked through his medications and did not have a bottle of the amlodipine-benazepril. Advised that I would call his pharmacy to update them on what he is taking and have them clarify if needed with his PCP.  Spoke with Charm at [facility] and he reports that patient picked up prescription at outside pharmacy on  12/4 90 day supply on amlodipine-benazepril. Reviewed that patient confirmed his bottles and did not have one with this on it. Recommended that they check with patients PCP for further advice and update patient.

## 2020-01-14 NOTE — Telephone Encounter (Signed)
Last office visit April 2020 he was taking amlodipine 5 mg daily lisinopril 20 mg daily Primary care may have changed the medications started him on amlodipine/benazepril and did not stop his other medications Can we explained to the patient that he does not need to take both, either 1 or the other Primary care can continue to fill the prescription and we can cancel separate prescriptions for amlodipine and lisinopril if he would prefer that

## 2020-01-15 NOTE — Telephone Encounter (Signed)
Patient calling back in to state he has been taking amlodipine-benazepril. Patient would like to be advised on whether he should continue to take medication or not

## 2020-01-15 NOTE — Addendum Note (Signed)
Addended by: Valora Corporal on: 01/15/2020 12:17 PM   Modules accepted: Orders

## 2020-01-15 NOTE — Telephone Encounter (Signed)
Spoke with patient and he reports that after looking at medications he found some others on the side that he was taking as well which included the amlodipine-benazepril. Reviewed that he should not be on both and he discarded the amlodipine-benazepril and will stay on the amlodipine & lisinopril. He read back all information with me to confirm what he would be taking and he had no further questions at this time. Discontinued the amlodipine-benazepril from his list.

## 2020-01-21 ENCOUNTER — Other Ambulatory Visit: Payer: Self-pay | Admitting: Cardiovascular Disease

## 2020-01-30 ENCOUNTER — Other Ambulatory Visit: Payer: Self-pay

## 2020-02-02 ENCOUNTER — Inpatient Hospital Stay: Payer: Medicare PPO | Attending: Oncology

## 2020-02-02 ENCOUNTER — Other Ambulatory Visit: Payer: Self-pay

## 2020-02-02 DIAGNOSIS — Z8249 Family history of ischemic heart disease and other diseases of the circulatory system: Secondary | ICD-10-CM | POA: Insufficient documentation

## 2020-02-02 DIAGNOSIS — I509 Heart failure, unspecified: Secondary | ICD-10-CM | POA: Diagnosis not present

## 2020-02-02 DIAGNOSIS — Z7984 Long term (current) use of oral hypoglycemic drugs: Secondary | ICD-10-CM | POA: Insufficient documentation

## 2020-02-02 DIAGNOSIS — E119 Type 2 diabetes mellitus without complications: Secondary | ICD-10-CM | POA: Insufficient documentation

## 2020-02-02 DIAGNOSIS — Z8042 Family history of malignant neoplasm of prostate: Secondary | ICD-10-CM | POA: Insufficient documentation

## 2020-02-02 DIAGNOSIS — D509 Iron deficiency anemia, unspecified: Secondary | ICD-10-CM

## 2020-02-02 DIAGNOSIS — Z808 Family history of malignant neoplasm of other organs or systems: Secondary | ICD-10-CM | POA: Diagnosis not present

## 2020-02-02 DIAGNOSIS — Z7982 Long term (current) use of aspirin: Secondary | ICD-10-CM | POA: Diagnosis not present

## 2020-02-02 DIAGNOSIS — Z833 Family history of diabetes mellitus: Secondary | ICD-10-CM | POA: Insufficient documentation

## 2020-02-02 DIAGNOSIS — Z87891 Personal history of nicotine dependence: Secondary | ICD-10-CM | POA: Diagnosis not present

## 2020-02-02 DIAGNOSIS — D649 Anemia, unspecified: Secondary | ICD-10-CM

## 2020-02-02 DIAGNOSIS — Z79899 Other long term (current) drug therapy: Secondary | ICD-10-CM | POA: Insufficient documentation

## 2020-02-02 DIAGNOSIS — I252 Old myocardial infarction: Secondary | ICD-10-CM | POA: Diagnosis not present

## 2020-02-02 LAB — CBC WITH DIFFERENTIAL/PLATELET
Abs Immature Granulocytes: 0.02 10*3/uL (ref 0.00–0.07)
Basophils Absolute: 0.1 10*3/uL (ref 0.0–0.1)
Basophils Relative: 1 %
Eosinophils Absolute: 0.2 10*3/uL (ref 0.0–0.5)
Eosinophils Relative: 2 %
HCT: 32.9 % — ABNORMAL LOW (ref 39.0–52.0)
Hemoglobin: 11.4 g/dL — ABNORMAL LOW (ref 13.0–17.0)
Immature Granulocytes: 0 %
Lymphocytes Relative: 41 %
Lymphs Abs: 3 10*3/uL (ref 0.7–4.0)
MCH: 30.9 pg (ref 26.0–34.0)
MCHC: 34.7 g/dL (ref 30.0–36.0)
MCV: 89.2 fL (ref 80.0–100.0)
Monocytes Absolute: 0.5 10*3/uL (ref 0.1–1.0)
Monocytes Relative: 7 %
Neutro Abs: 3.6 10*3/uL (ref 1.7–7.7)
Neutrophils Relative %: 49 %
Platelets: 181 10*3/uL (ref 150–400)
RBC: 3.69 MIL/uL — ABNORMAL LOW (ref 4.22–5.81)
RDW: 14.6 % (ref 11.5–15.5)
WBC: 7.3 10*3/uL (ref 4.0–10.5)
nRBC: 0 % (ref 0.0–0.2)

## 2020-02-02 LAB — IRON AND TIBC
Iron: 117 ug/dL (ref 45–182)
Saturation Ratios: 41 % — ABNORMAL HIGH (ref 17.9–39.5)
TIBC: 288 ug/dL (ref 250–450)
UIBC: 171 ug/dL

## 2020-02-02 LAB — FERRITIN: Ferritin: 81 ng/mL (ref 24–336)

## 2020-02-04 ENCOUNTER — Inpatient Hospital Stay (HOSPITAL_BASED_OUTPATIENT_CLINIC_OR_DEPARTMENT_OTHER): Payer: Medicare PPO | Admitting: Oncology

## 2020-02-04 ENCOUNTER — Encounter: Payer: Self-pay | Admitting: Oncology

## 2020-02-04 ENCOUNTER — Inpatient Hospital Stay: Payer: Medicare PPO

## 2020-02-04 ENCOUNTER — Other Ambulatory Visit: Payer: Self-pay

## 2020-02-04 VITALS — BP 146/80 | HR 63 | Temp 96.7°F | Resp 18 | Wt 207.1 lb

## 2020-02-04 DIAGNOSIS — D509 Iron deficiency anemia, unspecified: Secondary | ICD-10-CM | POA: Diagnosis not present

## 2020-02-04 NOTE — Progress Notes (Signed)
Hematology/Oncology Consult note [facility] Telephone:([phone] [phone]   Patient Care Team: Ellamae Sia, MD as PCP - General (Internal Medicine)  REFERRING PROVIDER: Ellamae Sia, MD  CHIEF COMPLAINTS/REASON FOR VISIT:  Evaluation of anemia  HISTORY OF PRESENTING ILLNESS:  Kenneth Duarte is a  74 y.o.  male with PMH listed below who was referred to me for evaluation of anemia Reviewed patient's recent labs that was done at [facility].  /28/2020 hemoglobin 9.7, hematocrit 29, MCV 86, platelet 179 ,000 vitamin B12 462, folate 4.6 Reviewed patient's previous labs, anemia is chronic onset , duration is since 2018 with baseline 9-10 recently. No aggravating or improving factors.  Associated signs and symptoms:  He feels pretty well.  No profound fatigue or shortness of breath.  He only feels minimal shortness of breath after climbing a flight of stairs. Denies weight loss, easy bruising, hematochezia, hemoptysis, hematuria. Context: History of GI bleeding: Denies               History of Chronic kidney disease: Denies               History of autoimmune disease: Denies               History of hemolytic anemia.  Denies               Last colonoscopy: 2016-sigmoid polyp resected  period.  He follows up with cardiology for CAD, CABG x3 Denies weight loss, fever, chills, fatigue, night sweats.   INTERVAL HISTORY Kenneth Duarte is a 74 y.o. male who has above history reviewed by me today presents for follow up visit for management of anemia Problems and complaints are listed below: S/p IV venofer.  He reports fatigue has significantly improved.    Review of Systems  Constitutional: Negative for appetite change, chills, diaphoresis, fatigue, fever and unexpected weight change.  HENT:   Negative for hearing loss, lump/mass, nosebleeds, sore throat and voice change.   Eyes: Negative for eye problems and icterus.    Respiratory: Negative for chest tightness, cough, hemoptysis, shortness of breath and wheezing.   Cardiovascular: Negative for chest pain and leg swelling.  Gastrointestinal: Negative for abdominal distention, abdominal pain, blood in stool, diarrhea, nausea and rectal pain.  Endocrine: Negative for hot flashes.  Genitourinary: Negative for bladder incontinence, difficulty urinating, dysuria, frequency, hematuria and nocturia.   Musculoskeletal: Negative for arthralgias, back pain, flank pain, gait problem and myalgias.  Skin: Negative for itching and rash.  Neurological: Negative for dizziness, gait problem, headaches, light-headedness, numbness and seizures.  Hematological: Negative for adenopathy. Does not bruise/bleed easily.  Psychiatric/Behavioral: Negative for confusion and decreased concentration. The patient is not nervous/anxious.      MEDICAL HISTORY:  Past Medical History:  Diagnosis Date  . Arthritis   . Chronic pain syndrome   . Congestive heart failure ([place]) 1980  . Coronary artery disease    a. s/p 3V CABG (03/2017). b. NSTEMI 06/2017 with progressive graft disease, s/p DES to SVG-OM.  . Diabetes ([place])    DIET  . Dyspnea    with exertion  . ED (erectile dysfunction)   . GERD (gastroesophageal reflux disease)   . Gout   . Headache   . Hyperlipemia   . Hypertension    CONTROLLED ON MEDS  . IDA (iron deficiency anemia) 10/21/2019  . Neuropathy of both feet   . Seborrheic keratosis   . Sinus congestion   .  Wears dentures    PARTIAL UPPER    SURGICAL HISTORY: Past Surgical History:  Procedure Laterality Date  . COLONOSCOPY WITH PROPOFOL N/A 10/25/2015   Procedure: COLONOSCOPY WITH PROPOFOL;  Surgeon: Lucilla Lame, MD;  Location: [place];  Service: Endoscopy;  Laterality: N/A;  DIABETIC-ORAL MEDS  . CORONARY ARTERY BYPASS GRAFT N/A 04/10/2017   Procedure: CORONARY ARTERY BYPASS GRAFTING (CABG) x three , using left internal mammary artery and right leg  greater saphenous vein harvested endoscopically;  Surgeon: Ivin Poot, MD;  Location: [place];  Service: Open Heart Surgery;  Laterality: N/A;  . CORONARY ARTERY BYPASS GRAFT    . CORONARY STENT INTERVENTION N/A 06/19/2017   Procedure: Coronary Stent Intervention;  Surgeon: Jettie Booze, MD;  Location: [place] [facility];  Service: Cardiovascular;  Laterality: N/A;  . KNEE SURGERY Right    over 20 years ago  . LEFT HEART CATH AND CORONARY ANGIOGRAPHY Left 02/22/2017   Procedure: Left Heart Cath and Coronary Angiography;  Surgeon: Corey Skains, MD;  Location: [place] [facility];  Service: Cardiovascular;  Laterality: Left;  . LEFT HEART CATH AND CORS/GRAFTS ANGIOGRAPHY N/A 06/18/2017   Procedure: Left Heart Cath and Cors/Grafts Angiography and PCI;  Surgeon: Isaias Cowman, MD;  Location: [place] [facility];  Service: Cardiovascular;  Laterality: N/A;  . POLYPECTOMY  10/25/2015   Procedure: POLYPECTOMY;  Surgeon: Lucilla Lame, MD;  Location: [place];  Service: Endoscopy;;  . ROTATOR CUFF REPAIR Right 2012  . TEE WITHOUT CARDIOVERSION N/A 04/10/2017   Procedure: TRANSESOPHAGEAL ECHOCARDIOGRAM (TEE);  Surgeon: Ivin Poot, MD;  Location: [place];  Service: Open Heart Surgery;  Laterality: N/A;    SOCIAL HISTORY: Social History   Socioeconomic History  . Marital status: Married    Spouse name: Not on file  . Number of children: Not on file  . Years of education: Not on file  . Highest education level: Not on file  Occupational History  . Not on file  Tobacco Use  . Smoking status: Former Smoker    Packs/day: 2.00    Years: 15.00    Pack years: 30.00    Types: Cigarettes    Quit date: 06/30/1979    Years since quitting: 40.6  . Smokeless tobacco: Never Used  Substance and Sexual Activity  . Alcohol use: No    Alcohol/week: 0.0 standard drinks    Comment: QUIT IN 1980  . Drug use: No  . Sexual activity: Not on file  Other Topics Concern  . Not on  file  Social History Narrative  . Not on file   Social Determinants of Health   Financial Resource Strain:   . Difficulty of Paying Living Expenses: Not on file  Food Insecurity:   . Worried About Charity fundraiser in the Last Year: Not on file  . Ran Out of Food in the Last Year: Not on file  Transportation Needs:   . Lack of Transportation (Medical): Not on file  . Lack of Transportation (Non-Medical): Not on file  Physical Activity:   . Days of Exercise per Week: Not on file  . Minutes of Exercise per Session: Not on file  Stress:   . Feeling of Stress : Not on file  Social Connections:   . Frequency of Communication with Friends and Family: Not on file  . Frequency of Social Gatherings with Friends and Family: Not on file  . Attends Religious Services: Not on file  . Active  Member of Clubs or Organizations: Not on file  . Attends Archivist Meetings: Not on file  . Marital Status: Not on file  Intimate Partner Violence:   . Fear of Current or Ex-Partner: Not on file  . Emotionally Abused: Not on file  . Physically Abused: Not on file  . Sexually Abused: Not on file    FAMILY HISTORY: Family History  Problem Relation Age of Onset  . Cancer Father   . Heart disease Father   . Heart attack Father   . Cancer Mother   . Heart disease Brother   . Diabetes Brother   . Prostate cancer Brother   . Diabetes Sister   . Bladder Cancer Neg Hx   . Kidney disease Neg Hx     ALLERGIES:  is allergic to blood-group specific substance and cortisone acetate [cortisone].  MEDICATIONS:  Current Outpatient Medications  Medication Sig Dispense Refill  . ACCU-CHEK AVIVA PLUS test strip     . Accu-Chek Softclix Lancets lancets     . acetaminophen (TYLENOL) 500 MG tablet Take 2 tablets (1,000 mg total) by mouth every 6 (six) hours as needed. (Patient taking differently: Take 1,000 mg by mouth every 6 (six) hours as needed for mild pain, fever or headache. ) 30 tablet 0  .  albuterol (PROVENTIL HFA;VENTOLIN HFA) 108 (90 Base) MCG/ACT inhaler Inhale into the lungs.    . Alcohol Swabs (B-D SINGLE USE SWABS REGULAR) PADS     . amLODipine (NORVASC) 5 MG tablet Take 5 mg by mouth daily.     Marland Kitchen aspirin 81 MG tablet Take 1 tablet (81 mg total) by mouth daily. 90 tablet 1  . atorvastatin (LIPITOR) 80 MG tablet TAKE 1 TABLET AT BEDTIME 90 tablet 3  . carvedilol (COREG) 12.5 MG tablet TAKE 1 TABLET TWICE DAILY WITH A MEAL 180 tablet 2  . clopidogrel (PLAVIX) 75 MG tablet Take 1 tablet (75 mg total) by mouth daily. 90 tablet 1  . docusate sodium (COLACE) 100 MG capsule Take 100 mg by mouth 2 (two) times daily as needed.     . FEROSUL 325 (65 Fe) MG tablet TAKE 1 TABLET EVERY DAY 90 tablet 0  . fluticasone (FLONASE) 50 MCG/ACT nasal spray Place into the nose.    . furosemide (LASIX) 20 MG tablet Take 1 tablet (20 mg total) by mouth daily. 90 tablet 3  . gabapentin (NEURONTIN) 800 MG tablet Take 1,600 mg by mouth 3 (three) times daily.    Marland Kitchen lisinopril (ZESTRIL) 20 MG tablet TAKE 1 TABLET EVERY DAY 90 tablet 3  . meclizine (ANTIVERT) 25 MG tablet Take 1 tablet (25 mg total) by mouth 2 (two) times daily as needed for dizziness. 180 tablet 3  . metFORMIN (GLUCOPHAGE-XR) 500 MG 24 hr tablet Take 500 mg by mouth 2 (two) times daily.     Marland Kitchen oxyCODONE-acetaminophen (PERCOCET) 10-325 MG tablet Take 1 tablet by mouth every 4 (four) hours as needed for pain. 40 tablet 0  . pantoprazole (PROTONIX) 40 MG tablet TAKE 1 TABLET EVERY DAY 90 tablet 0  . valACYclovir (VALTREX) 1000 MG tablet     . vitamin B-12 (CYANOCOBALAMIN) 1000 MCG tablet     . IBU 800 MG tablet     . nitroGLYCERIN (NITROSTAT) 0.4 MG SL tablet Place 1 tablet (0.4 mg total) under the tongue every 5 (five) minutes as needed for chest pain. (Patient not taking: Reported on 02/04/2020) 25 tablet 3   No current facility-administered medications for  this visit.     PHYSICAL EXAMINATION: ECOG PERFORMANCE STATUS: 0 -  Asymptomatic Vitals:   02/04/20 1341  BP: (!) 146/80  Pulse: 63  Resp: 18  Temp: (!) 96.7 F (35.9 C)   Filed Weights   02/04/20 1341  Weight: 207 lb 1.6 oz (93.9 kg)    Physical Exam Constitutional:      General: He is not in acute distress. HENT:     Head: Normocephalic and atraumatic.  Eyes:     General: No scleral icterus.    Pupils: Pupils are equal, round, and reactive to light.  Cardiovascular:     Rate and Rhythm: Normal rate and regular rhythm.     Heart sounds: Normal heart sounds.  Pulmonary:     Effort: Pulmonary effort is normal. No respiratory distress.     Breath sounds: No wheezing.  Abdominal:     General: Bowel sounds are normal. There is no distension.     Palpations: Abdomen is soft. There is no mass.     Tenderness: There is no abdominal tenderness.  Musculoskeletal:        General: No deformity. Normal range of motion.     Cervical back: Normal range of motion and neck supple.  Skin:    General: Skin is warm and dry.     Findings: No erythema or rash.  Neurological:     Mental Status: He is alert and oriented to person, place, and time. Mental status is at baseline.     Cranial Nerves: No cranial nerve deficit.     Coordination: Coordination normal.  Psychiatric:        Mood and Affect: Mood normal.        Behavior: Behavior normal.        Thought Content: Thought content normal.      LABORATORY DATA:  I have reviewed the data as listed Lab Results  Component Value Date   WBC 7.3 02/02/2020   HGB 11.4 (L) 02/02/2020   HCT 32.9 (L) 02/02/2020   MCV 89.2 02/02/2020   PLT 181 02/02/2020   Recent Labs    10/21/19 0956 11/10/19 0920  NA 127* 130*  K 4.3 5.0  CL 93* 98  CO2 24 23  GLUCOSE 129* 185*  BUN 20 14  CREATININE 1.44* 1.04  CALCIUM 8.7* 8.7*  GFRNONAA 49* >60  GFRAA 56* >60  PROT 7.1  --   ALBUMIN 3.7  --   AST 15  --   ALT 14  --   ALKPHOS 60  --   BILITOT 0.7  --    Iron/TIBC/Ferritin/ %Sat    Component  Value Date/Time   IRON 117 02/02/2020 1301   TIBC 288 02/02/2020 1301   FERRITIN 81 02/02/2020 1301   IRONPCTSAT 41 (H) 02/02/2020 1301     RADIOGRAPHIC STUDIES: I have personally reviewed the radiological images as listed and agreed with the findings in the report. No results found.    ASSESSMENT & PLAN:  1. Iron deficiency anemia, unspecified iron deficiency anemia type    #Iron deficiency anemia status post IV Venofer weekly x3 Labs are reviewed and discussed with patient. His hemoglobin has significantly improved to 11. Iron panel also has improved  Hold additional IV iron at this point.  Discussed with patient that he will need to reestablish care with gastroenterology for further evaluation of IDA. #Last colonoscopy was done on 10/25/2015 which showed sigmoid polyps. Will refer patient to see Dr. Allen Norris for evaluation of iron  deficiency anemia. Orders Placed This Encounter  Procedures  . CBC with Differential/Platelet    Standing Status:   Future    Standing Expiration Date:   02/03/2021  . Iron and TIBC    Standing Status:   Future    Standing Expiration Date:   02/03/2021  . Ferritin    Standing Status:   Future    Standing Expiration Date:   02/03/2021    All questions were answered. The patient knows to call the clinic with any problems questions or concerns.  Return of visit: 4 months Earlie Server, MD, PhD 02/04/2020

## 2020-02-04 NOTE — Progress Notes (Signed)
Patient here for follow up. No concerns voiced.  °

## 2020-02-09 ENCOUNTER — Other Ambulatory Visit: Payer: Self-pay | Admitting: Cardiovascular Disease

## 2020-02-19 ENCOUNTER — Ambulatory Visit: Payer: Medicare PPO | Admitting: Urology

## 2020-02-25 ENCOUNTER — Other Ambulatory Visit: Payer: Self-pay | Admitting: Oncology

## 2020-03-09 ENCOUNTER — Telehealth: Payer: Self-pay | Admitting: Podiatry

## 2020-03-09 NOTE — Telephone Encounter (Signed)
Pt left message Friday 3.19 checking status of diabetic shoes.  I returned call and told pt they should be shipping anytime and I would call to schedule an appt when they come in. He said well its only been 3 months (ordered 1.20.21) and I told him we have had some shoes on back order and apologized.

## 2020-03-29 ENCOUNTER — Other Ambulatory Visit: Payer: Self-pay | Admitting: Cardiovascular Disease

## 2020-04-15 ENCOUNTER — Telehealth: Payer: Self-pay | Admitting: Podiatry

## 2020-04-15 NOTE — Telephone Encounter (Signed)
Pt called checking status of diabetic shoes ordered in January.  Told pt I would call back once I hear back from distributor.

## 2020-04-19 ENCOUNTER — Other Ambulatory Visit: Payer: Self-pay | Admitting: Cardiovascular Disease

## 2020-05-04 ENCOUNTER — Telehealth: Payer: Self-pay | Admitting: Podiatry

## 2020-05-04 NOTE — Telephone Encounter (Signed)
Called pt because we have not gotten pts shoes in due to paperwork has expired. I explained to pt he needs an appt with pcp that treats his diabetes to get updated paperwork.  Diabetic shoe Paperwork is only valid for 6 months after appt with pcp and last appt was 10.19.2020 with pcp.

## 2020-05-05 ENCOUNTER — Ambulatory Visit: Payer: Medicare PPO | Admitting: Orthotics

## 2020-05-08 NOTE — Progress Notes (Signed)
Date:  05/10/2020   ID:  Kenneth Duarte, DOB 14-Feb-1946, MRN [id number]  Patient Location:  [place] [address]   Provider location:   [facility], [place] office  PCP:  Ellamae Sia, MD  Cardiologist:  Patsy Baltimore   Chief Complaint  Patient presents with  . other    12 month follow up. Meds reviewed by the pt. verbally. "doing well."      History of Present Illness:    Kenneth Duarte is a 74 y.o. male  past medical history of Smoker quit 1980 CAD, CABG  in 03/2017.  3V CABG with LIMA to LAD, SVG to OM, and SVG to PDA by Dr. Nils Pyle EF 50 to 55% by TEE Chest pain, transferred to [facility] Had stent placed to ostial vein graft to OM July 2018, PROMUS PREM MR [id number]  Who presents for follow-up of his coronary disease, CABG  Doing well, Sells produce, across the street from [place] On prior visit, had vertigo (BP was elevated) It is gone, no more dizziness  No regular exercise program Reports blood pressure well controlled at home, elevated today after long walk into the office  Very active in the summer, does not drink a lot, Still taking Lasix 20 daily  Lab work reviewed HAB1C 5.9 hct 29, followed by hematology, up to 32.9 in feb 2021  No angina, active  EKG personally reviewed by myself on todays visit NSR rate 68 BPM, nonspcific T wave  Other past medical history reviewed ST depression in lateral leads and elevated troponin @ 1.57--> 6.41. 3V CAD with 75% occl LIMA-->LAD near anastomotic site, occl SVG--> PDA and 95% occl SVG--> OM1 and 95% occl of native vessel distal to anastomotic site.  Cardiac catheterization July 2018 1. Severe three-vessel coronary artery disease with heavily calcified, eccentric 50-60% stenosis distal left main, occluded proximal left circumflex, subtotal mid RCA. 2. Patent LIMA to mid LAD with discrete 75% stenosis near anastomotic site 3. Occluded SVG to PDA 4. Patent  SVG to OM1 with a long diffuse 95% stenosis proximal segment of graft, with near 60-70% stenosis at the anastomotic site, 95% stenosis in the native vessel distal to the anastomotic site, with retrograde filling of left circumflex 5. Mildly reduced left ventricular function, with inferior-posterior wall akinesis  Transferred to [facility] Had stent placed to ostial vein graft to OM Dist Graft lesion, 75 %stenosed. There is more disease noted past the graft insertion in the OM and in the distal circumflex.   Prior CV studies:   The following studies were reviewed today:    Past Medical History:  Diagnosis Date  . Arthritis   . Chronic pain syndrome   . Congestive heart failure ([place]) 1980  . Coronary artery disease    a. s/p 3V CABG (03/2017). b. NSTEMI 06/2017 with progressive graft disease, s/p DES to SVG-OM.  . Diabetes ([place])    DIET  . Dyspnea    with exertion  . ED (erectile dysfunction)   . GERD (gastroesophageal reflux disease)   . Gout   . Headache   . Hyperlipemia   . Hypertension    CONTROLLED ON MEDS  . IDA (iron deficiency anemia) 10/21/2019  . Neuropathy of both feet   . Seborrheic keratosis   . Sinus congestion   . Wears dentures    PARTIAL UPPER   Past Surgical History:  Procedure Laterality Date  . COLONOSCOPY WITH PROPOFOL N/A 10/25/2015  Procedure: COLONOSCOPY WITH PROPOFOL;  Surgeon: Lucilla Lame, MD;  Location: [place];  Service: Endoscopy;  Laterality: N/A;  DIABETIC-ORAL MEDS  . CORONARY ARTERY BYPASS GRAFT N/A 04/10/2017   Procedure: CORONARY ARTERY BYPASS GRAFTING (CABG) x three , using left internal mammary artery and right leg greater saphenous vein harvested endoscopically;  Surgeon: Ivin Poot, MD;  Location: [place];  Service: Open Heart Surgery;  Laterality: N/A;  . CORONARY ARTERY BYPASS GRAFT    . CORONARY STENT INTERVENTION N/A 06/19/2017   Procedure: Coronary Stent Intervention;  Surgeon: Jettie Booze, MD;  Location: [place]  [facility];  Service: Cardiovascular;  Laterality: N/A;  . KNEE SURGERY Right    over 20 years ago  . LEFT HEART CATH AND CORONARY ANGIOGRAPHY Left 02/22/2017   Procedure: Left Heart Cath and Coronary Angiography;  Surgeon: Corey Skains, MD;  Location: [place] [facility];  Service: Cardiovascular;  Laterality: Left;  . LEFT HEART CATH AND CORS/GRAFTS ANGIOGRAPHY N/A 06/18/2017   Procedure: Left Heart Cath and Cors/Grafts Angiography and PCI;  Surgeon: Isaias Cowman, MD;  Location: [place] [facility];  Service: Cardiovascular;  Laterality: N/A;  . POLYPECTOMY  10/25/2015   Procedure: POLYPECTOMY;  Surgeon: Lucilla Lame, MD;  Location: [place];  Service: Endoscopy;;  . ROTATOR CUFF REPAIR Right 2012  . TEE WITHOUT CARDIOVERSION N/A 04/10/2017   Procedure: TRANSESOPHAGEAL ECHOCARDIOGRAM (TEE);  Surgeon: Ivin Poot, MD;  Location: [place];  Service: Open Heart Surgery;  Laterality: N/A;     Current Meds  Medication Sig  . ACCU-CHEK AVIVA PLUS test strip   . Accu-Chek Softclix Lancets lancets   . acetaminophen (TYLENOL) 500 MG tablet Take 2 tablets (1,000 mg total) by mouth every 6 (six) hours as needed. (Patient taking differently: Take 1,000 mg by mouth every 6 (six) hours as needed for mild pain, fever or headache. )  . albuterol (PROVENTIL HFA;VENTOLIN HFA) 108 (90 Base) MCG/ACT inhaler Inhale into the lungs.  . Alcohol Swabs (B-D SINGLE USE SWABS REGULAR) PADS   . amLODipine (NORVASC) 5 MG tablet Take 1 tablet (5 mg total) by mouth daily.  Marland Kitchen aspirin 81 MG tablet Take 1 tablet (81 mg total) by mouth daily.  Marland Kitchen atorvastatin (LIPITOR) 80 MG tablet TAKE 1 TABLET AT BEDTIME  . carvedilol (COREG) 12.5 MG tablet TAKE 1 TABLET TWICE DAILY WITH A MEAL  . clopidogrel (PLAVIX) 75 MG tablet Take 1 tablet (75 mg total) by mouth daily.  Marland Kitchen docusate sodium (COLACE) 100 MG capsule Take 100 mg by mouth 2 (two) times daily as needed.   . FEROSUL 325 (65 Fe) MG tablet TAKE 1 TABLET EVERY DAY   . fluticasone (FLONASE) 50 MCG/ACT nasal spray Place into the nose.  . furosemide (LASIX) 20 MG tablet Take 1 tablet (20 mg total) by mouth daily.  Marland Kitchen gabapentin (NEURONTIN) 800 MG tablet Take 1,600 mg by mouth 3 (three) times daily.  . IBU 800 MG tablet   . lisinopril (ZESTRIL) 20 MG tablet TAKE 1 TABLET EVERY DAY  . meclizine (ANTIVERT) 25 MG tablet Take 1 tablet (25 mg total) by mouth 2 (two) times daily as needed for dizziness.  . metFORMIN (GLUCOPHAGE-XR) 500 MG 24 hr tablet Take 500 mg by mouth 2 (two) times daily.   . nitroGLYCERIN (NITROSTAT) 0.4 MG SL tablet Place 1 tablet (0.4 mg total) under the tongue every 5 (five) minutes as needed for chest pain.  Marland Kitchen oxyCODONE-acetaminophen (PERCOCET) 10-325 MG tablet Take 1  tablet by mouth every 4 (four) hours as needed for pain.  . pantoprazole (PROTONIX) 40 MG tablet TAKE 1 TABLET EVERY DAY  . valACYclovir (VALTREX) 1000 MG tablet   . vitamin B-12 (CYANOCOBALAMIN) 1000 MCG tablet      Allergies:   Blood-group specific substance and Cortisone acetate [cortisone]   Social History   Tobacco Use  . Smoking status: Former Smoker    Packs/day: 2.00    Years: 15.00    Pack years: 30.00    Types: Cigarettes    Quit date: 06/30/1979    Years since quitting: 40.8  . Smokeless tobacco: Never Used  Substance Use Topics  . Alcohol use: No    Alcohol/week: 0.0 standard drinks    Comment: QUIT IN 1980  . Drug use: No     Family Hx: The patient's family history includes Cancer in his father and mother; Diabetes in his brother and sister; Heart attack in his father; Heart disease in his brother and father; Prostate cancer in his brother. There is no history of Bladder Cancer or Kidney disease.  ROS:   Please see the history of present illness.    Review of Systems  Constitutional: Negative.   Respiratory: Negative.   Cardiovascular: Negative.   Gastrointestinal: Negative.   Musculoskeletal: Negative.   Neurological: Positive for  dizziness.  Psychiatric/Behavioral: Negative.   All other systems reviewed and are negative.     Labs/Other Tests and Data Reviewed:    Recent Labs: 10/21/2019: ALT 14; TSH 3.425 11/10/2019: BUN 14; Creatinine, Ser 1.04; Potassium 5.0; Sodium 130 02/02/2020: Hemoglobin 11.4; Platelets 181   Recent Lipid Panel Lab Results  Component Value Date/Time   CHOL 91 06/18/2017 11:37 PM   TRIG 198 (H) 06/18/2017 11:37 PM   HDL 22 (L) 06/18/2017 11:37 PM   CHOLHDL 4.1 06/18/2017 11:37 PM   LDLCALC 29 06/18/2017 11:37 PM    Wt Readings from Last 3 Encounters:  05/10/20 203 lb 8 oz (92.3 kg)  02/04/20 207 lb 1.6 oz (93.9 kg)  11/04/19 212 lb 12.8 oz (96.5 kg)     Exam:    BP (!) 150/62 (BP Location: Left Arm, Patient Position: Sitting, Cuff Size: Normal)   Pulse 68   Ht 6\' 1"  (1.854 m)   Wt 203 lb 8 oz (92.3 kg)   SpO2 99%   BMI 26.85 kg/m  Constitutional:  oriented to person, place, and time. No distress.  HENT:  Head: Grossly normal Eyes:  no discharge. No scleral icterus.  Neck: No JVD, no carotid bruits  Cardiovascular: Regular rate and rhythm, no murmurs appreciated Pulmonary/Chest: Clear to auscultation bilaterally, no wheezes or rails Abdominal: Soft.  no distension.  no tenderness.  Musculoskeletal: Normal range of motion Neurological:  normal muscle tone. Coordination normal. No atrophy Skin: Skin warm and dry Psychiatric: normal affect, pleasant   ASSESSMENT & PLAN:    Coronary artery disease of bypass graft of native heart with stable angina pectoris ([place]) - Plan: EKG 12-Lead Currently with no symptoms of angina. No further workup at this time. Continue current medication regimen. Active, sells produce  Benign essential HTN Blood pressure elevated walking into the office today, recommend he continue to check at home  Type 2 diabetes mellitus with other circulatory complication, without long-term current use of insulin (HCC) Hemoglobin A1c well  controlled  Dyslipidemia We will request repeat lipid panel with primary care, sees them in 2 weeks  S/P CABG x 3 - Plan: EKG 12-Lead Previously declined cardiac  rehab No unstable anginal symptoms Non-smoker, diabetes well controlled, cholesterol in the past well controlled   Total encounter time more than 25 minutes  Greater than 50% was spent in counseling and coordination of care with the patient   Disposition: Follow-up in 12 months   Signed, Ida Rogue, MD  05/10/2020 8:54 AM    [place] Office [address] [place] [id number], [place], [place] [address]

## 2020-05-10 ENCOUNTER — Ambulatory Visit: Payer: Medicare PPO | Admitting: Cardiovascular Disease

## 2020-05-10 ENCOUNTER — Telehealth: Payer: Self-pay | Admitting: Cardiovascular Disease

## 2020-05-10 ENCOUNTER — Other Ambulatory Visit: Payer: Self-pay

## 2020-05-10 ENCOUNTER — Encounter: Payer: Self-pay | Admitting: Cardiovascular Disease

## 2020-05-10 VITALS — BP 150/62 | HR 68 | Ht 73.0 in | Wt 203.5 lb

## 2020-05-10 DIAGNOSIS — I25708 Atherosclerosis of coronary artery bypass graft(s), unspecified, with other forms of angina pectoris: Secondary | ICD-10-CM

## 2020-05-10 DIAGNOSIS — I739 Peripheral vascular disease, unspecified: Secondary | ICD-10-CM | POA: Diagnosis not present

## 2020-05-10 DIAGNOSIS — E1159 Type 2 diabetes mellitus with other circulatory complications: Secondary | ICD-10-CM

## 2020-05-10 DIAGNOSIS — E785 Hyperlipidemia, unspecified: Secondary | ICD-10-CM

## 2020-05-10 DIAGNOSIS — Z951 Presence of aortocoronary bypass graft: Secondary | ICD-10-CM | POA: Diagnosis not present

## 2020-05-10 DIAGNOSIS — I10 Essential (primary) hypertension: Secondary | ICD-10-CM

## 2020-05-10 NOTE — Patient Instructions (Signed)

## 2020-05-10 NOTE — Telephone Encounter (Signed)
Patient calling States that he noticed the nitroglycerin 0.4MG  he has is almost 74 years old  Would like to know if he needs to renew these (send to [facility] [address]) or if those are ok to keep Please call to discuss

## 2020-05-11 MED ORDER — NITROGLYCERIN 0.4 MG SL SUBL: 0.4000 mg | SUBLINGUAL_TABLET | SUBLINGUAL | 3 refills | Status: DC | PRN

## 2020-05-13 ENCOUNTER — Telehealth: Payer: Self-pay | Admitting: Podiatry

## 2020-05-14 NOTE — Telephone Encounter (Signed)
Pt called and left message about his diabetic shoes  I did call back and discuss with pt that I have faxed the paperwork to his doctor with a note that pt has an appt with them and to please resign forms and fax back.

## 2020-05-25 ENCOUNTER — Inpatient Hospital Stay: Payer: Medicare PPO | Attending: Oncology

## 2020-05-25 ENCOUNTER — Other Ambulatory Visit: Payer: Self-pay | Admitting: Specialist

## 2020-05-25 ENCOUNTER — Other Ambulatory Visit: Payer: Self-pay

## 2020-05-25 DIAGNOSIS — D509 Iron deficiency anemia, unspecified: Secondary | ICD-10-CM

## 2020-05-25 DIAGNOSIS — R918 Other nonspecific abnormal finding of lung field: Secondary | ICD-10-CM

## 2020-05-25 LAB — CBC WITH DIFFERENTIAL/PLATELET
Abs Immature Granulocytes: 0.02 10*3/uL (ref 0.00–0.07)
Basophils Absolute: 0.1 10*3/uL (ref 0.0–0.1)
Basophils Relative: 1 %
Eosinophils Absolute: 0.1 10*3/uL (ref 0.0–0.5)
Eosinophils Relative: 1 %
HCT: 32.2 % — ABNORMAL LOW (ref 39.0–52.0)
Hemoglobin: 11.7 g/dL — ABNORMAL LOW (ref 13.0–17.0)
Immature Granulocytes: 0 %
Lymphocytes Relative: 37 %
Lymphs Abs: 2.8 10*3/uL (ref 0.7–4.0)
MCH: 31.8 pg (ref 26.0–34.0)
MCHC: 36.3 g/dL — ABNORMAL HIGH (ref 30.0–36.0)
MCV: 87.5 fL (ref 80.0–100.0)
Monocytes Absolute: 0.5 10*3/uL (ref 0.1–1.0)
Monocytes Relative: 6 %
Neutro Abs: 4.2 10*3/uL (ref 1.7–7.7)
Neutrophils Relative %: 55 %
Platelets: 146 10*3/uL — ABNORMAL LOW (ref 150–400)
RBC: 3.68 MIL/uL — ABNORMAL LOW (ref 4.22–5.81)
RDW: 12.8 % (ref 11.5–15.5)
WBC: 7.6 10*3/uL (ref 4.0–10.5)
nRBC: 0 % (ref 0.0–0.2)

## 2020-05-25 LAB — IRON AND TIBC
Iron: 65 ug/dL (ref 45–182)
Saturation Ratios: 22 % (ref 17.9–39.5)
TIBC: 294 ug/dL (ref 250–450)
UIBC: 229 ug/dL

## 2020-05-25 LAB — FERRITIN: Ferritin: 30 ng/mL (ref 24–336)

## 2020-05-26 ENCOUNTER — Inpatient Hospital Stay: Payer: Medicare PPO

## 2020-05-26 ENCOUNTER — Inpatient Hospital Stay: Payer: Medicare PPO | Admitting: Oncology

## 2020-05-26 ENCOUNTER — Encounter: Payer: Self-pay | Admitting: Oncology

## 2020-05-26 VITALS — BP 120/69 | HR 62 | Temp 97.4°F | Resp 18 | Wt 201.4 lb

## 2020-05-26 DIAGNOSIS — D509 Iron deficiency anemia, unspecified: Secondary | ICD-10-CM | POA: Diagnosis not present

## 2020-05-26 DIAGNOSIS — Z86010 Personal history of colonic polyps: Secondary | ICD-10-CM | POA: Diagnosis not present

## 2020-05-26 NOTE — Progress Notes (Signed)
Patient denies new problems/concerns today.   °

## 2020-05-26 NOTE — Progress Notes (Signed)
Hematology/Oncology Consult note Kenneth Duarte Telephone:([phone] [phone]   Patient Care Team: Ellamae Sia, MD as PCP - General (Internal Medicine)  REFERRING PROVIDER: Ellamae Sia, MD  CHIEF COMPLAINTS/REASON FOR VISIT:  Evaluation of anemia  HISTORY OF PRESENTING ILLNESS:  Kenneth Duarte is a  74 y.o.  male with PMH listed below who was referred to me for evaluation of anemia Reviewed patient's recent labs that was done at [facility] clinic.  /28/2020 hemoglobin 9.7, hematocrit 29, MCV 86, platelet 179 ,000 vitamin B12 462, folate 4.6 Reviewed patient's previous labs, anemia is chronic onset , duration is since 2018 with baseline 9-10 recently. No aggravating or improving factors.  Associated signs and symptoms:  He feels pretty well.  No profound fatigue or shortness of breath.  He only feels minimal shortness of breath after climbing a flight of stairs. Denies weight loss, easy bruising, hematochezia, hemoptysis, hematuria. Context: History of GI bleeding: Denies               History of Chronic kidney disease: Denies               History of autoimmune disease: Denies               History of hemolytic anemia.  Denies               Last colonoscopy: 2016-sigmoid polyp resected  period.  He follows up with cardiology for CAD, CABG x3 Denies weight loss, fever, chills, fatigue, night sweats.   INTERVAL HISTORY Kenneth Duarte is a 74 y.o. male who has above history reviewed by me today presents for follow up visit for management of anemia Problems and complaints are listed below: Patient currently is off iron supplementation. He reports feeling well.  No fatigue.     Review of Systems  Constitutional: Negative for appetite change, chills, diaphoresis, fatigue, fever and unexpected weight change.  HENT:   Negative for hearing loss, lump/mass, nosebleeds, sore throat and voice change.   Eyes: Negative for eye  problems and icterus.  Respiratory: Negative for chest tightness, cough, hemoptysis, shortness of breath and wheezing.   Cardiovascular: Negative for chest pain and leg swelling.  Gastrointestinal: Negative for abdominal distention, abdominal pain, blood in stool, diarrhea, nausea and rectal pain.  Endocrine: Negative for hot flashes.  Genitourinary: Negative for bladder incontinence, difficulty urinating, dysuria, frequency, hematuria and nocturia.   Musculoskeletal: Negative for arthralgias, back pain, flank pain, gait problem and myalgias.  Skin: Negative for itching and rash.  Neurological: Negative for dizziness, gait problem, headaches, light-headedness, numbness and seizures.  Hematological: Negative for adenopathy. Does not bruise/bleed easily.  Psychiatric/Behavioral: Negative for confusion and decreased concentration. The patient is not nervous/anxious.      MEDICAL HISTORY:  Past Medical History:  Diagnosis Date  . Arthritis   . Chronic pain syndrome   . Congestive heart failure ([place]) 1980  . Coronary artery disease    a. s/p 3V CABG (03/2017). b. NSTEMI 06/2017 with progressive graft disease, s/p DES to SVG-OM.  . Diabetes ([place])    DIET  . Dyspnea    with exertion  . ED (erectile dysfunction)   . GERD (gastroesophageal reflux disease)   . Gout   . Headache   . Hyperlipemia   . Hypertension    CONTROLLED ON MEDS  . IDA (iron deficiency anemia) 10/21/2019  . Neuropathy of both feet   . Seborrheic keratosis   . Sinus congestion   .  Wears dentures    PARTIAL UPPER    SURGICAL HISTORY: Past Surgical History:  Procedure Laterality Date  . COLONOSCOPY WITH PROPOFOL N/A 10/25/2015   Procedure: COLONOSCOPY WITH PROPOFOL;  Surgeon: Lucilla Lame, MD;  Location: [place];  Service: Endoscopy;  Laterality: N/A;  DIABETIC-ORAL MEDS  . CORONARY ARTERY BYPASS GRAFT N/A 04/10/2017   Procedure: CORONARY ARTERY BYPASS GRAFTING (CABG) x three , using left internal mammary  artery and right leg greater saphenous vein harvested endoscopically;  Surgeon: Ivin Poot, MD;  Location: [place];  Service: Open Heart Surgery;  Laterality: N/A;  . CORONARY ARTERY BYPASS GRAFT    . CORONARY STENT INTERVENTION N/A 06/19/2017   Procedure: Coronary Stent Intervention;  Surgeon: Jettie Booze, MD;  Location: [place] [facility];  Service: Cardiovascular;  Laterality: N/A;  . KNEE SURGERY Right    over 20 years ago  . LEFT HEART CATH AND CORONARY ANGIOGRAPHY Left 02/22/2017   Procedure: Left Heart Cath and Coronary Angiography;  Surgeon: Corey Skains, MD;  Location: [place] [facility];  Service: Cardiovascular;  Laterality: Left;  . LEFT HEART CATH AND CORS/GRAFTS ANGIOGRAPHY N/A 06/18/2017   Procedure: Left Heart Cath and Cors/Grafts Angiography and PCI;  Surgeon: Isaias Cowman, MD;  Location: [place] [facility];  Service: Cardiovascular;  Laterality: N/A;  . POLYPECTOMY  10/25/2015   Procedure: POLYPECTOMY;  Surgeon: Lucilla Lame, MD;  Location: [place];  Service: Endoscopy;;  . ROTATOR CUFF REPAIR Right 2012  . TEE WITHOUT CARDIOVERSION N/A 04/10/2017   Procedure: TRANSESOPHAGEAL ECHOCARDIOGRAM (TEE);  Surgeon: Ivin Poot, MD;  Location: [place];  Service: Open Heart Surgery;  Laterality: N/A;    SOCIAL HISTORY: Social History   Socioeconomic History  . Marital status: Married    Spouse name: Not on file  . Number of children: Not on file  . Years of education: Not on file  . Highest education level: Not on file  Occupational History  . Not on file  Tobacco Use  . Smoking status: Former Smoker    Packs/day: 2.00    Years: 15.00    Pack years: 30.00    Types: Cigarettes    Quit date: 06/30/1979    Years since quitting: 40.9  . Smokeless tobacco: Never Used  Substance and Sexual Activity  . Alcohol use: No    Alcohol/week: 0.0 standard drinks    Comment: QUIT IN 1980  . Drug use: No  . Sexual activity: Not on file  Other Topics  Concern  . Not on file  Social History Narrative  . Not on file   Social Determinants of Health   Financial Resource Strain:   . Difficulty of Paying Living Expenses:   Food Insecurity:   . Worried About Charity fundraiser in the Last Year:   . Arboriculturist in the Last Year:   Transportation Needs:   . Film/video editor (Medical):   Marland Kitchen Lack of Transportation (Non-Medical):   Physical Activity:   . Days of Exercise per Week:   . Minutes of Exercise per Session:   Stress:   . Feeling of Stress :   Social Connections:   . Frequency of Communication with Friends and Family:   . Frequency of Social Gatherings with Friends and Family:   . Attends Religious Services:   . Active Member of Clubs or Organizations:   . Attends Archivist Meetings:   Marland Kitchen Marital Status:   Intimate Production manager  Violence:   . Fear of Current or Ex-Partner:   . Emotionally Abused:   Marland Kitchen Physically Abused:   . Sexually Abused:     FAMILY HISTORY: Family History  Problem Relation Age of Onset  . Cancer Father   . Heart disease Father   . Heart attack Father   . Cancer Mother   . Heart disease Brother   . Diabetes Brother   . Prostate cancer Brother   . Diabetes Sister   . Bladder Cancer Neg Hx   . Kidney disease Neg Hx     ALLERGIES:  is allergic to blood-group specific substance and cortisone acetate [cortisone].  MEDICATIONS:  Current Outpatient Medications  Medication Sig Dispense Refill  . ACCU-CHEK AVIVA PLUS test strip     . Accu-Chek Softclix Lancets lancets     . acetaminophen (TYLENOL) 500 MG tablet Take 2 tablets (1,000 mg total) by mouth every 6 (six) hours as needed. (Patient taking differently: Take 1,000 mg by mouth every 6 (six) hours as needed for mild pain, fever or headache. ) 30 tablet 0  . albuterol (PROVENTIL HFA;VENTOLIN HFA) 108 (90 Base) MCG/ACT inhaler Inhale into the lungs.    . Alcohol Swabs (B-D SINGLE USE SWABS REGULAR) PADS     . amLODipine (NORVASC) 5  MG tablet Take 1 tablet (5 mg total) by mouth daily. 90 tablet 0  . aspirin 81 MG tablet Take 1 tablet (81 mg total) by mouth daily. 90 tablet 1  . atorvastatin (LIPITOR) 80 MG tablet TAKE 1 TABLET AT BEDTIME 90 tablet 3  . carvedilol (COREG) 12.5 MG tablet TAKE 1 TABLET TWICE DAILY WITH A MEAL 180 tablet 2  . clopidogrel (PLAVIX) 75 MG tablet Take 1 tablet (75 mg total) by mouth daily. 90 tablet 1  . docusate sodium (COLACE) 100 MG capsule Take 100 mg by mouth 2 (two) times daily as needed.     . fluticasone (FLONASE) 50 MCG/ACT nasal spray Place into the nose.    . furosemide (LASIX) 20 MG tablet Take 1 tablet (20 mg total) by mouth daily. 90 tablet 3  . gabapentin (NEURONTIN) 800 MG tablet Take 1,600 mg by mouth 3 (three) times daily.    . IBU 800 MG tablet     . lisinopril (ZESTRIL) 20 MG tablet TAKE 1 TABLET EVERY DAY 90 tablet 3  . meclizine (ANTIVERT) 25 MG tablet Take 1 tablet (25 mg total) by mouth 2 (two) times daily as needed for dizziness. 180 tablet 3  . metFORMIN (GLUCOPHAGE-XR) 500 MG 24 hr tablet Take 500 mg by mouth 2 (two) times daily.     . nitroGLYCERIN (NITROSTAT) 0.4 MG SL tablet Place 1 tablet (0.4 mg total) under the tongue every 5 (five) minutes as needed for chest pain. 25 tablet 3  . oxyCODONE-acetaminophen (PERCOCET) 10-325 MG tablet Take 1 tablet by mouth every 4 (four) hours as needed for pain. 40 tablet 0  . pantoprazole (PROTONIX) 40 MG tablet TAKE 1 TABLET EVERY DAY 90 tablet 0  . valACYclovir (VALTREX) 1000 MG tablet     . vitamin B-12 (CYANOCOBALAMIN) 1000 MCG tablet     . FEROSUL 325 (65 Fe) MG tablet TAKE 1 TABLET EVERY DAY (Patient not taking: Reported on 05/26/2020) 90 tablet 0   No current facility-administered medications for this visit.     PHYSICAL EXAMINATION: ECOG PERFORMANCE STATUS: 0 - Asymptomatic Vitals:   05/26/20 1259  BP: 120/69  Pulse: 62  Resp: 18  Temp: (!) 97.4 F (  36.3 C)   Filed Weights   05/26/20 1259  Weight: 201 lb 6.4 oz  (91.4 kg)    Physical Exam Constitutional:      General: He is not in acute distress. HENT:     Head: Normocephalic and atraumatic.  Eyes:     General: No scleral icterus.    Pupils: Pupils are equal, round, and reactive to light.  Cardiovascular:     Rate and Rhythm: Normal rate and regular rhythm.     Heart sounds: Normal heart sounds.  Pulmonary:     Effort: Pulmonary effort is normal. No respiratory distress.     Breath sounds: No wheezing.  Abdominal:     General: Bowel sounds are normal. There is no distension.     Palpations: Abdomen is soft. There is no mass.     Tenderness: There is no abdominal tenderness.  Musculoskeletal:        General: No deformity. Normal range of motion.     Cervical back: Normal range of motion and neck supple.  Skin:    General: Skin is warm and dry.     Findings: No erythema or rash.  Neurological:     Mental Status: He is alert and oriented to person, place, and time. Mental status is at baseline.     Cranial Nerves: No cranial nerve deficit.     Coordination: Coordination normal.  Psychiatric:        Mood and Affect: Mood normal.      LABORATORY DATA:  I have reviewed the data as listed Lab Results  Component Value Date   WBC 7.6 05/25/2020   HGB 11.7 (L) 05/25/2020   HCT 32.2 (L) 05/25/2020   MCV 87.5 05/25/2020   PLT 146 (L) 05/25/2020   Recent Labs    10/21/19 0956 11/10/19 0920  NA 127* 130*  K 4.3 5.0  CL 93* 98  CO2 24 23  GLUCOSE 129* 185*  BUN 20 14  CREATININE 1.44* 1.04  CALCIUM 8.7* 8.7*  GFRNONAA 49* >60  GFRAA 56* >60  PROT 7.1  --   ALBUMIN 3.7  --   AST 15  --   ALT 14  --   ALKPHOS 60  --   BILITOT 0.7  --    Iron/TIBC/Ferritin/ %Sat    Component Value Date/Time   IRON 65 05/25/2020 1054   TIBC 294 05/25/2020 1054   FERRITIN 30 05/25/2020 1054   IRONPCTSAT 22 05/25/2020 1054     RADIOGRAPHIC STUDIES: I have personally reviewed the radiological images as listed and agreed with the  findings in the report. No results found.    ASSESSMENT & PLAN:  1. Iron deficiency anemia, unspecified iron deficiency anemia type   2. History of colon polyps    #Iron deficiency anemia status post IV Venofer weekly x3 Labs reviewed and discussed with patient. No iron deficiency.  No need for IV or oral iron supplementation at this point. Continue to monitor.  Etiology of recent iron deficiency, possible GI blood loss. Recommend patient to reestablish care with Dr. Allen Norris for further evaluation.  He has a history of  will refer.  No orders of the defined types were placed in this encounter.   All questions were answered. The patient knows to call the clinic with any problems questions or concerns.  Return of visit: 4 months Earlie Server, MD, PhD 05/26/2020

## 2020-06-02 ENCOUNTER — Other Ambulatory Visit: Payer: Self-pay | Admitting: Cardiovascular Disease

## 2020-06-05 ENCOUNTER — Other Ambulatory Visit: Payer: Self-pay | Admitting: Cardiovascular Disease

## 2020-06-25 ENCOUNTER — Other Ambulatory Visit: Payer: Self-pay | Admitting: Cardiovascular Disease

## 2020-07-19 ENCOUNTER — Telehealth: Payer: Self-pay | Admitting: Podiatry

## 2020-07-19 NOTE — Telephone Encounter (Signed)
Pt called asking about status of diabetic shoes he ordered in January. He said he got a call in may stating he needed an appt with the pcp.  I checked and it looks like they were back ordered and I asked pt if ok to possibly order a different pair of shoes but same color and he said yes. I did apologize several times and told pt to call me in a few weeks if he has not heard from me.

## 2020-07-29 ENCOUNTER — Other Ambulatory Visit: Payer: Self-pay | Admitting: Cardiovascular Disease

## 2020-08-01 ENCOUNTER — Other Ambulatory Visit: Payer: Self-pay | Admitting: Cardiovascular Disease

## 2020-08-16 ENCOUNTER — Other Ambulatory Visit: Payer: Self-pay

## 2020-08-16 ENCOUNTER — Encounter: Payer: Self-pay | Admitting: Cardiovascular Disease

## 2020-08-16 ENCOUNTER — Ambulatory Visit
Admission: RE | Admit: 2020-08-16 | Discharge: 2020-08-16 | Disposition: A | Discharge status: Home / Self Care | Payer: Medicare PPO | Source: Ambulatory Visit | Attending: Specialist | Admitting: Specialist

## 2020-08-16 DIAGNOSIS — R918 Other nonspecific abnormal finding of lung field: Secondary | ICD-10-CM | POA: Insufficient documentation

## 2020-08-25 ENCOUNTER — Ambulatory Visit (INDEPENDENT_AMBULATORY_CARE_PROVIDER_SITE_OTHER): Payer: Medicare PPO | Admitting: Orthotics

## 2020-08-25 ENCOUNTER — Other Ambulatory Visit: Payer: Self-pay

## 2020-08-25 DIAGNOSIS — E1142 Type 2 diabetes mellitus with diabetic polyneuropathy: Secondary | ICD-10-CM

## 2020-08-25 DIAGNOSIS — M2042 Other hammer toe(s) (acquired), left foot: Secondary | ICD-10-CM | POA: Diagnosis not present

## 2020-08-25 DIAGNOSIS — M2041 Other hammer toe(s) (acquired), right foot: Secondary | ICD-10-CM | POA: Diagnosis not present

## 2020-08-25 DIAGNOSIS — M2032 Hallux varus (acquired), left foot: Secondary | ICD-10-CM

## 2020-08-25 DIAGNOSIS — Z89422 Acquired absence of other left toe(s): Secondary | ICD-10-CM | POA: Diagnosis not present

## 2020-08-25 NOTE — Progress Notes (Signed)

## 2020-10-13 ENCOUNTER — Other Ambulatory Visit: Payer: Self-pay

## 2020-10-13 ENCOUNTER — Ambulatory Visit: Payer: Medicare PPO | Admitting: Gastroenterology

## 2020-10-13 ENCOUNTER — Encounter: Payer: Self-pay | Admitting: Gastroenterology

## 2020-10-13 VITALS — BP 153/61 | HR 76 | Ht 73.0 in | Wt 195.4 lb

## 2020-10-13 DIAGNOSIS — D500 Iron deficiency anemia secondary to blood loss (chronic): Secondary | ICD-10-CM

## 2020-10-13 MED ORDER — SUPREP BOWEL PREP KIT 17.5-3.13-1.6 GM/177ML PO SOLN: 1.0000 | ORAL | 0 refills | Status: DC

## 2020-10-13 NOTE — H&P (View-Only) (Signed)
Gastroenterology Consultation  Referring Provider:     Ellamae Sia, MD Primary Care Physician:  Kenneth Sia, MD Primary Gastroenterologist:  Dr. Allen Norris     Reason for Consultation:     Iron deficiency anemia        HPI:   Kenneth Duarte is a 74 y.o. y/o male referred for consultation & management of iron deficiency anemia by Dr. Quay Burow, Ala Dach, MD.  This patient comes to see me today with a history of iron deficiency anemia.  The patient had a colonoscopy by me back in 2016 and at that time the patient was being done for an increased risk for colon polyps.  The patient was found to have a colon polyp and was recommended to have repeat colonoscopy in 5 years.  The patient was found to have anemia with his blood work showing:  Component     Latest Ref Rng & Units 10/21/2019 02/02/2020 05/25/2020            Hemoglobin     13.0 - 17.0 g/dL 9.0 (L) 11.4 (L) 11.7 (L)  HCT     39 - 52 % 26.9 (L) 32.9 (L) 32.2 (L)   The patient's iron studies were also checked that showed:  Component     Latest Ref Rng & Units 10/21/2019 11/10/2019 02/02/2020 05/25/2020  Iron     45 - 182 ug/dL 38 (L) 58 117 65  TIBC     250 - 450 ug/dL 400 348 288 294  Saturation Ratios     17.9 - 39.5 % 10 (L) 17 (L) 41 (H) 22   The patient has been treated by hematology with IV iron.  The patient denies any black stools bloody stools or any sign of bleeding from any other source.  He denies any unexplained weight loss fevers chills nausea or vomiting.  He does report that he lost some weight during the summer but he was much more active and reports that he has gained the weight back.  There is no report of any abdominal pain.  Past Medical History:  Diagnosis Date  . Arthritis   . Chronic pain syndrome   . Congestive heart failure ([place]) 1980  . Coronary artery disease    a. s/p 3V CABG (03/2017). b. NSTEMI 06/2017 with progressive graft disease, s/p DES to SVG-OM.  . Diabetes ([place])    DIET  . Dyspnea     with exertion  . ED (erectile dysfunction)   . GERD (gastroesophageal reflux disease)   . Gout   . Headache   . Hyperlipemia   . Hypertension    CONTROLLED ON MEDS  . IDA (iron deficiency anemia) 10/21/2019  . Neuropathy of both feet   . Seborrheic keratosis   . Sinus congestion   . Wears dentures    PARTIAL UPPER    Past Surgical History:  Procedure Laterality Date  . COLONOSCOPY WITH PROPOFOL N/A 10/25/2015   Procedure: COLONOSCOPY WITH PROPOFOL;  Surgeon: Kenneth Lame, MD;  Location: [place];  Service: Endoscopy;  Laterality: N/A;  DIABETIC-ORAL MEDS  . CORONARY ARTERY BYPASS GRAFT N/A 04/10/2017   Procedure: CORONARY ARTERY BYPASS GRAFTING (CABG) x three , using left internal mammary artery and right leg greater saphenous vein harvested endoscopically;  Surgeon: Ivin Poot, MD;  Location: [place];  Service: Open Heart Surgery;  Laterality: N/A;  . CORONARY ARTERY BYPASS GRAFT    . CORONARY STENT INTERVENTION N/A 06/19/2017   Procedure:  Coronary Stent Intervention;  Surgeon: Jettie Booze, MD;  Location: [place] [facility];  Service: Cardiovascular;  Laterality: N/A;  . KNEE SURGERY Right    over 20 years ago  . LEFT HEART CATH AND CORONARY ANGIOGRAPHY Left 02/22/2017   Procedure: Left Heart Cath and Coronary Angiography;  Surgeon: Corey Skains, MD;  Location: [place] [facility];  Service: Cardiovascular;  Laterality: Left;  . LEFT HEART CATH AND CORS/GRAFTS ANGIOGRAPHY N/A 06/18/2017   Procedure: Left Heart Cath and Cors/Grafts Angiography and PCI;  Surgeon: Isaias Cowman, MD;  Location: [place] [facility];  Service: Cardiovascular;  Laterality: N/A;  . POLYPECTOMY  10/25/2015   Procedure: POLYPECTOMY;  Surgeon: Kenneth Lame, MD;  Location: [place];  Service: Endoscopy;;  . ROTATOR CUFF REPAIR Right 2012  . TEE WITHOUT CARDIOVERSION N/A 04/10/2017   Procedure: TRANSESOPHAGEAL ECHOCARDIOGRAM (TEE);  Surgeon: Ivin Poot, MD;  Location:  [place];  Service: Open Heart Surgery;  Laterality: N/A;    Prior to Admission medications   Medication Sig Start Date End Date Taking? Authorizing Provider  ACCU-CHEK AVIVA PLUS test strip  11/20/19   [provider]  Accu-Chek Softclix Lancets lancets  11/20/19   [provider]  acetaminophen (TYLENOL) 500 MG tablet Take 2 tablets (1,000 mg total) by mouth every 6 (six) hours as needed. Patient taking differently: Take 1,000 mg by mouth every 6 (six) hours as needed for mild pain, fever or headache.  04/15/17   Barrett, Erin R, PA-C  albuterol (PROVENTIL HFA;VENTOLIN HFA) 108 (90 Base) MCG/ACT inhaler Inhale into the lungs.    [provider]  Alcohol Swabs (B-D SINGLE USE SWABS REGULAR) PADS  11/17/19   [provider]  amLODipine (NORVASC) 5 MG tablet TAKE 1 TABLET EVERY DAY 06/25/20   Minna Merritts, MD  aspirin 81 MG tablet Take 1 tablet (81 mg total) by mouth daily. 06/20/17   Dunn, Nedra Hai, PA-C  atorvastatin (LIPITOR) 80 MG tablet TAKE 1 TABLET AT BEDTIME 08/02/20   Minna Merritts, MD  carvedilol (COREG) 12.5 MG tablet TAKE 1 TABLET TWICE DAILY WITH A MEAL 07/29/20   Minna Merritts, MD  clopidogrel (PLAVIX) 75 MG tablet TAKE 1 TABLET EVERY DAY 06/02/20   Minna Merritts, MD  docusate sodium (COLACE) 100 MG capsule Take 100 mg by mouth 2 (two) times daily as needed.     [provider]  FEROSUL 325 (65 Fe) MG tablet TAKE 1 TABLET EVERY DAY Patient not taking: Reported on 05/26/2020 01/01/20   Earlie Server, MD  fluticasone [facility]) 50 MCG/ACT nasal spray Place into the nose.    [provider]  furosemide (LASIX) 20 MG tablet TAKE 1 TABLET EVERY DAY 08/02/20   Minna Merritts, MD  gabapentin (NEURONTIN) 800 MG tablet Take 1,600 mg by mouth 3 (three) times daily.    [provider]  IBU 800 MG tablet  02/10/19   [provider]  lisinopril (ZESTRIL) 20 MG tablet TAKE 1 TABLET EVERY DAY 12/26/19   Minna Merritts, MD    meclizine (ANTIVERT) 25 MG tablet TAKE 1 TABLET TWICE DAILY AS NEEDED FOR DIZZINESS 08/02/20   Minna Merritts, MD  metFORMIN (GLUCOPHAGE-XR) 500 MG 24 hr tablet Take 500 mg by mouth 2 (two) times daily.  05/29/17   [provider]  nitroGLYCERIN (NITROSTAT) 0.4 MG SL tablet Place 1 tablet (0.4 mg total) under the tongue every 5 (five) minutes as needed for chest pain.  05/11/20   Minna Merritts, MD  oxyCODONE-acetaminophen (PERCOCET) 10-325 MG tablet Take 1 tablet by mouth every 4 (four) hours as needed for pain. 04/15/17   Barrett, Erin R, PA-C  pantoprazole (PROTONIX) 40 MG tablet TAKE 1 TABLET EVERY DAY 08/02/20   Minna Merritts, MD  valACYclovir (VALTREX) 1000 MG tablet  01/06/20   [provider]  vitamin B-12 (CYANOCOBALAMIN) 1000 MCG tablet  09/17/19   [provider]    Family History  Problem Relation Age of Onset  . Cancer Father   . Heart disease Father   . Heart attack Father   . Cancer Mother   . Heart disease Brother   . Diabetes Brother   . Prostate cancer Brother   . Diabetes Sister   . Bladder Cancer Neg Hx   . Kidney disease Neg Hx      Social History   Tobacco Use  . Smoking status: Former Smoker    Packs/day: 2.00    Years: 15.00    Pack years: 30.00    Types: Cigarettes    Quit date: 06/30/1979    Years since quitting: 41.3  . Smokeless tobacco: Never Used  Vaping Use  . Vaping Use: Never used  Substance Use Topics  . Alcohol use: No    Alcohol/week: 0.0 standard drinks    Comment: QUIT IN 1980  . Drug use: No    Allergies as of 10/13/2020 - Review Complete 05/26/2020  Allergen Reaction Noted  . Blood-group specific substance Other (See Comments) 06/18/2017  . Cortisone acetate [cortisone] Rash and Other (See Comments) 10/12/2014    Review of Systems:    All systems reviewed and negative except where noted in HPI.   Physical Exam:  There were no vitals taken for this visit. No LMP for male patient. General:    Alert,  Well-developed, well-nourished, pleasant and cooperative in NAD Head:  Normocephalic and atraumatic. Eyes:  Sclera clear, no icterus.   Conjunctiva pink. Ears:  Normal auditory acuity. Neck:  Supple; no masses or thyromegaly. Lungs:  Respirations even and unlabored.  Clear throughout to auscultation.   No wheezes, crackles, or rhonchi. No acute distress. Heart:  Regular rate and rhythm; no murmurs, clicks, rubs, or gallops. Abdomen:  Normal bowel sounds.  No bruits.  Soft, non-tender and non-distended without masses, hepatosplenomegaly or hernias noted.  No guarding or rebound tenderness.  Negative Carnett sign.   Rectal:  Deferred.  Pulses:  Normal pulses noted. Extremities:  No clubbing or edema.  No cyanosis. Neurologic:  Alert and oriented x3;  grossly normal neurologically. Skin:  Intact without significant lesions or rashes.  No jaundice. Lymph Nodes:  No significant cervical adenopathy. Psych:  Alert and cooperative. Normal mood and affect.  Imaging Studies: No results found.  Assessment and Plan:   Kenneth Duarte is a 74 y.o. y/o male who comes in today with a history of colon polyps and his last colonoscopy was 5 years ago.  The patient will be set up for an EGD and colonoscopy to look for source of the iron deficiency anemia.  The patient has been explained the plan agrees with it.    Kenneth Lame, MD. Marval Regal    Note: This dictation was prepared with Dragon dictation along with smaller phrase technology. Any transcriptional errors that result from this process are unintentional.

## 2020-10-13 NOTE — Progress Notes (Signed)
Gastroenterology Consultation  Referring Provider:     Ellamae Sia, MD Primary Care Physician:  Ellamae Sia, MD Primary Gastroenterologist:  Dr. Allen Norris     Reason for Consultation:     Iron deficiency anemia        HPI:   Kenneth Duarte is a 74 y.o. y/o male referred for consultation & management of iron deficiency anemia by Dr. Quay Burow, Ala Dach, MD.  This patient comes to see me today with a history of iron deficiency anemia.  The patient had a colonoscopy by me back in 2016 and at that time the patient was being done for an increased risk for colon polyps.  The patient was found to have a colon polyp and was recommended to have repeat colonoscopy in 5 years.  The patient was found to have anemia with his blood work showing:  Component     Latest Ref Rng & Units 10/21/2019 02/02/2020 05/25/2020            Hemoglobin     13.0 - 17.0 g/dL 9.0 (L) 11.4 (L) 11.7 (L)  HCT     39 - 52 % 26.9 (L) 32.9 (L) 32.2 (L)   The patient's iron studies were also checked that showed:  Component     Latest Ref Rng & Units 10/21/2019 11/10/2019 02/02/2020 05/25/2020  Iron     45 - 182 ug/dL 38 (L) 58 117 65  TIBC     250 - 450 ug/dL 400 348 288 294  Saturation Ratios     17.9 - 39.5 % 10 (L) 17 (L) 41 (H) 22   The patient has been treated by hematology with IV iron.  The patient denies any black stools bloody stools or any sign of bleeding from any other source.  He denies any unexplained weight loss fevers chills nausea or vomiting.  He does report that he lost some weight during the summer but he was much more active and reports that he has gained the weight back.  There is no report of any abdominal pain.  Past Medical History:  Diagnosis Date   Arthritis    Chronic pain syndrome    Congestive heart failure ([place]) 1980   Coronary artery disease    a. s/p 3V CABG (03/2017). b. NSTEMI 06/2017 with progressive graft disease, s/p DES to SVG-OM.   Diabetes ([place])    DIET   Dyspnea     with exertion   ED (erectile dysfunction)    GERD (gastroesophageal reflux disease)    Gout    Headache    Hyperlipemia    Hypertension    CONTROLLED ON MEDS   IDA (iron deficiency anemia) 10/21/2019   Neuropathy of both feet    Seborrheic keratosis    Sinus congestion    Wears dentures    PARTIAL UPPER    Past Surgical History:  Procedure Laterality Date   COLONOSCOPY WITH PROPOFOL N/A 10/25/2015   Procedure: COLONOSCOPY WITH PROPOFOL;  Surgeon: Lucilla Lame, MD;  Location: [place];  Service: Endoscopy;  Laterality: N/A;  DIABETIC-ORAL MEDS   CORONARY ARTERY BYPASS GRAFT N/A 04/10/2017   Procedure: CORONARY ARTERY BYPASS GRAFTING (CABG) x three , using left internal mammary artery and right leg greater saphenous vein harvested endoscopically;  Surgeon: Ivin Poot, MD;  Location: [place];  Service: Open Heart Surgery;  Laterality: N/A;   CORONARY ARTERY BYPASS GRAFT     CORONARY STENT INTERVENTION N/A 06/19/2017   Procedure:  Coronary Stent Intervention;  Surgeon: Jettie Booze, MD;  Location: [place] [facility];  Service: Cardiovascular;  Laterality: N/A;   KNEE SURGERY Right    over 20 years ago   LEFT HEART CATH AND CORONARY ANGIOGRAPHY Left 02/22/2017   Procedure: Left Heart Cath and Coronary Angiography;  Surgeon: Corey Skains, MD;  Location: [place] [facility];  Service: Cardiovascular;  Laterality: Left;   LEFT HEART CATH AND CORS/GRAFTS ANGIOGRAPHY N/A 06/18/2017   Procedure: Left Heart Cath and Cors/Grafts Angiography and PCI;  Surgeon: Isaias Cowman, MD;  Location: [place] [facility];  Service: Cardiovascular;  Laterality: N/A;   POLYPECTOMY  10/25/2015   Procedure: POLYPECTOMY;  Surgeon: Lucilla Lame, MD;  Location: [place];  Service: Endoscopy;;   ROTATOR CUFF REPAIR Right 2012   TEE WITHOUT CARDIOVERSION N/A 04/10/2017   Procedure: TRANSESOPHAGEAL ECHOCARDIOGRAM (TEE);  Surgeon: Ivin Poot, MD;  Location:  [place];  Service: Open Heart Surgery;  Laterality: N/A;    Prior to Admission medications   Medication Sig Start Date End Date Taking? Authorizing Provider  ACCU-CHEK AVIVA PLUS test strip  11/20/19   [provider]  Accu-Chek Softclix Lancets lancets  11/20/19   [provider]  acetaminophen (TYLENOL) 500 MG tablet Take 2 tablets (1,000 mg total) by mouth every 6 (six) hours as needed. Patient taking differently: Take 1,000 mg by mouth every 6 (six) hours as needed for mild pain, fever or headache.  04/15/17   Barrett, Erin R, PA-C  albuterol (PROVENTIL HFA;VENTOLIN HFA) 108 (90 Base) MCG/ACT inhaler Inhale into the lungs.    [provider]  Alcohol Swabs (B-D SINGLE USE SWABS REGULAR) PADS  11/17/19   [provider]  amLODipine (NORVASC) 5 MG tablet TAKE 1 TABLET EVERY DAY 06/25/20   Minna Merritts, MD  aspirin 81 MG tablet Take 1 tablet (81 mg total) by mouth daily. 06/20/17   Dunn, Nedra Hai, PA-C  atorvastatin (LIPITOR) 80 MG tablet TAKE 1 TABLET AT BEDTIME 08/02/20   Minna Merritts, MD  carvedilol (COREG) 12.5 MG tablet TAKE 1 TABLET TWICE DAILY WITH A MEAL 07/29/20   Minna Merritts, MD  clopidogrel (PLAVIX) 75 MG tablet TAKE 1 TABLET EVERY DAY 06/02/20   Minna Merritts, MD  docusate sodium (COLACE) 100 MG capsule Take 100 mg by mouth 2 (two) times daily as needed.     [provider]  FEROSUL 325 (65 Fe) MG tablet TAKE 1 TABLET EVERY DAY Patient not taking: Reported on 05/26/2020 01/01/20   Earlie Server, MD  fluticasone [facility]) 50 MCG/ACT nasal spray Place into the nose.    [provider]  furosemide (LASIX) 20 MG tablet TAKE 1 TABLET EVERY DAY 08/02/20   Minna Merritts, MD  gabapentin (NEURONTIN) 800 MG tablet Take 1,600 mg by mouth 3 (three) times daily.    [provider]  IBU 800 MG tablet  02/10/19   [provider]  lisinopril (ZESTRIL) 20 MG tablet TAKE 1 TABLET EVERY DAY 12/26/19   Minna Merritts, MD    meclizine (ANTIVERT) 25 MG tablet TAKE 1 TABLET TWICE DAILY AS NEEDED FOR DIZZINESS 08/02/20   Minna Merritts, MD  metFORMIN (GLUCOPHAGE-XR) 500 MG 24 hr tablet Take 500 mg by mouth 2 (two) times daily.  05/29/17   [provider]  nitroGLYCERIN (NITROSTAT) 0.4 MG SL tablet Place 1 tablet (0.4 mg total) under the tongue every 5 (five) minutes as needed for chest pain.  05/11/20   Minna Merritts, MD  oxyCODONE-acetaminophen (PERCOCET) 10-325 MG tablet Take 1 tablet by mouth every 4 (four) hours as needed for pain. 04/15/17   Barrett, Erin R, PA-C  pantoprazole (PROTONIX) 40 MG tablet TAKE 1 TABLET EVERY DAY 08/02/20   Minna Merritts, MD  valACYclovir (VALTREX) 1000 MG tablet  01/06/20   [provider]  vitamin B-12 (CYANOCOBALAMIN) 1000 MCG tablet  09/17/19   [provider]    Family History  Problem Relation Age of Onset   Cancer Father    Heart disease Father    Heart attack Father    Cancer Mother    Heart disease Brother    Diabetes Brother    Prostate cancer Brother    Diabetes Sister    Bladder Cancer Neg Hx    Kidney disease Neg Hx      Social History   Tobacco Use   Smoking status: Former Smoker    Packs/day: 2.00    Years: 15.00    Pack years: 30.00    Types: Cigarettes    Quit date: 06/30/1979    Years since quitting: 41.3   Smokeless tobacco: Never Used  Vaping Use   Vaping Use: Never used  Substance Use Topics   Alcohol use: No    Alcohol/week: 0.0 standard drinks    Comment: QUIT IN 1980   Drug use: No    Allergies as of 10/13/2020 - Review Complete 05/26/2020  Allergen Reaction Noted   Blood-group specific substance Other (See Comments) 06/18/2017   Cortisone acetate [cortisone] Rash and Other (See Comments) 10/12/2014    Review of Systems:    All systems reviewed and negative except where noted in HPI.   Physical Exam:  There were no vitals taken for this visit. No LMP for male patient. General:    Alert,  Well-developed, well-nourished, pleasant and cooperative in NAD Head:  Normocephalic and atraumatic. Eyes:  Sclera clear, no icterus.   Conjunctiva pink. Ears:  Normal auditory acuity. Neck:  Supple; no masses or thyromegaly. Lungs:  Respirations even and unlabored.  Clear throughout to auscultation.   No wheezes, crackles, or rhonchi. No acute distress. Heart:  Regular rate and rhythm; no murmurs, clicks, rubs, or gallops. Abdomen:  Normal bowel sounds.  No bruits.  Soft, non-tender and non-distended without masses, hepatosplenomegaly or hernias noted.  No guarding or rebound tenderness.  Negative Carnett sign.   Rectal:  Deferred.  Pulses:  Normal pulses noted. Extremities:  No clubbing or edema.  No cyanosis. Neurologic:  Alert and oriented x3;  grossly normal neurologically. Skin:  Intact without significant lesions or rashes.  No jaundice. Lymph Nodes:  No significant cervical adenopathy. Psych:  Alert and cooperative. Normal mood and affect.  Imaging Studies: No results found.  Assessment and Plan:   Kenneth Duarte is a 74 y.o. y/o male who comes in today with a history of colon polyps and his last colonoscopy was 5 years ago.  The patient will be set up for an EGD and colonoscopy to look for source of the iron deficiency anemia.  The patient has been explained the plan agrees with it.    Lucilla Lame, MD. Marval Regal    Note: This dictation was prepared with Dragon dictation along with smaller phrase technology. Any transcriptional errors that result from this process are unintentional.

## 2020-10-13 NOTE — H&P (View-Only) (Signed)
Gastroenterology Consultation  Referring Provider:     Ellamae Sia, MD Primary Care Physician:  Ellamae Sia, MD Primary Gastroenterologist:  Dr. Allen Norris     Reason for Consultation:     Iron deficiency anemia        HPI:   Kenneth Duarte is a 74 y.o. y/o male referred for consultation & management of iron deficiency anemia by Dr. Quay Burow, Ala Dach, MD.  This patient comes to see me today with a history of iron deficiency anemia.  The patient had a colonoscopy by me back in 2016 and at that time the patient was being done for an increased risk for colon polyps.  The patient was found to have a colon polyp and was recommended to have repeat colonoscopy in 5 years.  The patient was found to have anemia with his blood work showing:  Component     Latest Ref Rng & Units 10/21/2019 02/02/2020 05/25/2020            Hemoglobin     13.0 - 17.0 g/dL 9.0 (L) 11.4 (L) 11.7 (L)  HCT     39 - 52 % 26.9 (L) 32.9 (L) 32.2 (L)   The patient's iron studies were also checked that showed:  Component     Latest Ref Rng & Units 10/21/2019 11/10/2019 02/02/2020 05/25/2020  Iron     45 - 182 ug/dL 38 (L) 58 117 65  TIBC     250 - 450 ug/dL 400 348 288 294  Saturation Ratios     17.9 - 39.5 % 10 (L) 17 (L) 41 (H) 22   The patient has been treated by hematology with IV iron.  The patient denies any black stools bloody stools or any sign of bleeding from any other source.  He denies any unexplained weight loss fevers chills nausea or vomiting.  He does report that he lost some weight during the summer but he was much more active and reports that he has gained the weight back.  There is no report of any abdominal pain.  Past Medical History:  Diagnosis Date  . Arthritis   . Chronic pain syndrome   . Congestive heart failure ([place]) 1980  . Coronary artery disease    a. s/p 3V CABG (03/2017). b. NSTEMI 06/2017 with progressive graft disease, s/p DES to SVG-OM.  . Diabetes ([place])    DIET  . Dyspnea     with exertion  . ED (erectile dysfunction)   . GERD (gastroesophageal reflux disease)   . Gout   . Headache   . Hyperlipemia   . Hypertension    CONTROLLED ON MEDS  . IDA (iron deficiency anemia) 10/21/2019  . Neuropathy of both feet   . Seborrheic keratosis   . Sinus congestion   . Wears dentures    PARTIAL UPPER    Past Surgical History:  Procedure Laterality Date  . COLONOSCOPY WITH PROPOFOL N/A 10/25/2015   Procedure: COLONOSCOPY WITH PROPOFOL;  Surgeon: Lucilla Lame, MD;  Location: [place];  Service: Endoscopy;  Laterality: N/A;  DIABETIC-ORAL MEDS  . CORONARY ARTERY BYPASS GRAFT N/A 04/10/2017   Procedure: CORONARY ARTERY BYPASS GRAFTING (CABG) x three , using left internal mammary artery and right leg greater saphenous vein harvested endoscopically;  Surgeon: Ivin Poot, MD;  Location: [place];  Service: Open Heart Surgery;  Laterality: N/A;  . CORONARY ARTERY BYPASS GRAFT    . CORONARY STENT INTERVENTION N/A 06/19/2017   Procedure:  Coronary Stent Intervention;  Surgeon: Jettie Booze, MD;  Location: [place] [facility];  Service: Cardiovascular;  Laterality: N/A;  . KNEE SURGERY Right    over 20 years ago  . LEFT HEART CATH AND CORONARY ANGIOGRAPHY Left 02/22/2017   Procedure: Left Heart Cath and Coronary Angiography;  Surgeon: Corey Skains, MD;  Location: [place] [facility];  Service: Cardiovascular;  Laterality: Left;  . LEFT HEART CATH AND CORS/GRAFTS ANGIOGRAPHY N/A 06/18/2017   Procedure: Left Heart Cath and Cors/Grafts Angiography and PCI;  Surgeon: Isaias Cowman, MD;  Location: [place] [facility];  Service: Cardiovascular;  Laterality: N/A;  . POLYPECTOMY  10/25/2015   Procedure: POLYPECTOMY;  Surgeon: Lucilla Lame, MD;  Location: [place];  Service: Endoscopy;;  . ROTATOR CUFF REPAIR Right 2012  . TEE WITHOUT CARDIOVERSION N/A 04/10/2017   Procedure: TRANSESOPHAGEAL ECHOCARDIOGRAM (TEE);  Surgeon: Ivin Poot, MD;  Location:  [place];  Service: Open Heart Surgery;  Laterality: N/A;    Prior to Admission medications   Medication Sig Start Date End Date Taking? Authorizing Provider  ACCU-CHEK AVIVA PLUS test strip  11/20/19   [provider]  Accu-Chek Softclix Lancets lancets  11/20/19   [provider]  acetaminophen (TYLENOL) 500 MG tablet Take 2 tablets (1,000 mg total) by mouth every 6 (six) hours as needed. Patient taking differently: Take 1,000 mg by mouth every 6 (six) hours as needed for mild pain, fever or headache.  04/15/17   Barrett, Erin R, PA-C  albuterol (PROVENTIL HFA;VENTOLIN HFA) 108 (90 Base) MCG/ACT inhaler Inhale into the lungs.    [provider]  Alcohol Swabs (B-D SINGLE USE SWABS REGULAR) PADS  11/17/19   [provider]  amLODipine (NORVASC) 5 MG tablet TAKE 1 TABLET EVERY DAY 06/25/20   Minna Merritts, MD  aspirin 81 MG tablet Take 1 tablet (81 mg total) by mouth daily. 06/20/17   Dunn, Nedra Hai, PA-C  atorvastatin (LIPITOR) 80 MG tablet TAKE 1 TABLET AT BEDTIME 08/02/20   Minna Merritts, MD  carvedilol (COREG) 12.5 MG tablet TAKE 1 TABLET TWICE DAILY WITH A MEAL 07/29/20   Minna Merritts, MD  clopidogrel (PLAVIX) 75 MG tablet TAKE 1 TABLET EVERY DAY 06/02/20   Minna Merritts, MD  docusate sodium (COLACE) 100 MG capsule Take 100 mg by mouth 2 (two) times daily as needed.     [provider]  FEROSUL 325 (65 Fe) MG tablet TAKE 1 TABLET EVERY DAY Patient not taking: Reported on 05/26/2020 01/01/20   Earlie Server, MD  fluticasone [facility] 50 MCG/ACT nasal spray Place into the nose.    [provider]  furosemide (LASIX) 20 MG tablet TAKE 1 TABLET EVERY DAY 08/02/20   Minna Merritts, MD  gabapentin (NEURONTIN) 800 MG tablet Take 1,600 mg by mouth 3 (three) times daily.    [provider]  IBU 800 MG tablet  02/10/19   [provider]  lisinopril (ZESTRIL) 20 MG tablet TAKE 1 TABLET EVERY DAY 12/26/19   Minna Merritts, MD    meclizine (ANTIVERT) 25 MG tablet TAKE 1 TABLET TWICE DAILY AS NEEDED FOR DIZZINESS 08/02/20   Minna Merritts, MD  metFORMIN (GLUCOPHAGE-XR) 500 MG 24 hr tablet Take 500 mg by mouth 2 (two) times daily.  05/29/17   [provider]  nitroGLYCERIN (NITROSTAT) 0.4 MG SL tablet Place 1 tablet (0.4 mg total) under the tongue every 5 (five) minutes as needed for chest pain.  05/11/20   Minna Merritts, MD  oxyCODONE-acetaminophen (PERCOCET) 10-325 MG tablet Take 1 tablet by mouth every 4 (four) hours as needed for pain. 04/15/17   Barrett, Erin R, PA-C  pantoprazole (PROTONIX) 40 MG tablet TAKE 1 TABLET EVERY DAY 08/02/20   Minna Merritts, MD  valACYclovir (VALTREX) 1000 MG tablet  01/06/20   [provider]  vitamin B-12 (CYANOCOBALAMIN) 1000 MCG tablet  09/17/19   [provider]    Family History  Problem Relation Age of Onset  . Cancer Father   . Heart disease Father   . Heart attack Father   . Cancer Mother   . Heart disease Brother   . Diabetes Brother   . Prostate cancer Brother   . Diabetes Sister   . Bladder Cancer Neg Hx   . Kidney disease Neg Hx      Social History   Tobacco Use  . Smoking status: Former Smoker    Packs/day: 2.00    Years: 15.00    Pack years: 30.00    Types: Cigarettes    Quit date: 06/30/1979    Years since quitting: 41.3  . Smokeless tobacco: Never Used  Vaping Use  . Vaping Use: Never used  Substance Use Topics  . Alcohol use: No    Alcohol/week: 0.0 standard drinks    Comment: QUIT IN 1980  . Drug use: No    Allergies as of 10/13/2020 - Review Complete 05/26/2020  Allergen Reaction Noted  . Blood-group specific substance Other (See Comments) 06/18/2017  . Cortisone acetate [cortisone] Rash and Other (See Comments) 10/12/2014    Review of Systems:    All systems reviewed and negative except where noted in HPI.   Physical Exam:  There were no vitals taken for this visit. No LMP for male patient. General:    Alert,  Well-developed, well-nourished, pleasant and cooperative in NAD Head:  Normocephalic and atraumatic. Eyes:  Sclera clear, no icterus.   Conjunctiva pink. Ears:  Normal auditory acuity. Neck:  Supple; no masses or thyromegaly. Lungs:  Respirations even and unlabored.  Clear throughout to auscultation.   No wheezes, crackles, or rhonchi. No acute distress. Heart:  Regular rate and rhythm; no murmurs, clicks, rubs, or gallops. Abdomen:  Normal bowel sounds.  No bruits.  Soft, non-tender and non-distended without masses, hepatosplenomegaly or hernias noted.  No guarding or rebound tenderness.  Negative Carnett sign.   Rectal:  Deferred.  Pulses:  Normal pulses noted. Extremities:  No clubbing or edema.  No cyanosis. Neurologic:  Alert and oriented x3;  grossly normal neurologically. Skin:  Intact without significant lesions or rashes.  No jaundice. Lymph Nodes:  No significant cervical adenopathy. Psych:  Alert and cooperative. Normal mood and affect.  Imaging Studies: No results found.  Assessment and Plan:   Kenneth Duarte is a 74 y.o. y/o male who comes in today with a history of colon polyps and his last colonoscopy was 5 years ago.  The patient will be set up for an EGD and colonoscopy to look for source of the iron deficiency anemia.  The patient has been explained the plan agrees with it.    Lucilla Lame, MD. Marval Regal    Note: This dictation was prepared with Dragon dictation along with smaller phrase technology. Any transcriptional errors that result from this process are unintentional.

## 2020-10-17 NOTE — Progress Notes (Signed)
With cc preop pool

## 2020-10-19 ENCOUNTER — Other Ambulatory Visit: Payer: Self-pay

## 2020-10-19 ENCOUNTER — Telehealth: Payer: Self-pay

## 2020-10-19 ENCOUNTER — Encounter: Payer: Self-pay | Admitting: Gastroenterology

## 2020-10-19 NOTE — Telephone Encounter (Signed)
Spoke with pt regarding stopping his Plavix. Pt stated he was doing well. Advised him to stop yesterday, Nov 1st. Pt verbalized understanding.

## 2020-10-19 NOTE — Telephone Encounter (Signed)
-----   Message from Minna Merritts, MD sent at 10/17/2020 11:07 AM EDT ----- Regarding: preop Not much turnaround time for preop pool to call patient and get the updated clinical information required will cc triage nurse in cardiology office, recommend he hold the Plavix Monday, November 1  Perhaps preop pool can call him to make sure no change in clinical status, no unstable angina symptoms in preparation for procedure November 5 Thx TGollan ----- Message ----- From: Glennie Isle, CMA Sent: 10/13/2020   1:55 PM EDT To: Minna Merritts, MD

## 2020-10-20 ENCOUNTER — Other Ambulatory Visit
Admission: RE | Admit: 2020-10-20 | Discharge: 2020-10-20 | Disposition: A | Discharge status: Home / Self Care | Payer: Medicare PPO | Source: Ambulatory Visit | Attending: Gastroenterology | Admitting: Gastroenterology

## 2020-10-20 DIAGNOSIS — Z01812 Encounter for preprocedural laboratory examination: Secondary | ICD-10-CM | POA: Diagnosis present

## 2020-10-20 DIAGNOSIS — Z20822 Contact with and (suspected) exposure to covid-19: Secondary | ICD-10-CM | POA: Insufficient documentation

## 2020-10-20 LAB — SARS CORONAVIRUS 2 (TAT 6-24 HRS): SARS Coronavirus 2: NEGATIVE

## 2020-10-21 NOTE — Discharge Instructions (Signed)
General Anesthesia, Adult, Care After This sheet gives you information about how to care for yourself after your procedure. Your health care provider may also give you more specific instructions. If you have problems or questions, contact your health care provider. What can I expect after the procedure? After the procedure, the following side effects are common:  Pain or discomfort at the IV site.  Nausea.  Vomiting.  Sore throat.  Trouble concentrating.  Feeling cold or chills.  Weak or tired.  Sleepiness and fatigue.  Soreness and body aches. These side effects can affect parts of the body that were not involved in surgery. Follow these instructions at home:  For at least 24 hours after the procedure:  Have a responsible adult stay with you. It is important to have someone help care for you until you are awake and alert.  Rest as needed.  Do not: ? Participate in activities in which you could fall or become injured. ? Drive. ? Use heavy machinery. ? Drink alcohol. ? Take sleeping pills or medicines that cause drowsiness. ? Make important decisions or sign legal documents. ? Take care of children on your own. Eating and drinking  Follow any instructions from your health care provider about eating or drinking restrictions.  When you feel hungry, start by eating small amounts of foods that are soft and easy to digest (bland), such as toast. Gradually return to your regular diet.  Drink enough fluid to keep your urine pale yellow.  If you vomit, rehydrate by drinking water, juice, or clear broth. General instructions  If you have sleep apnea, surgery and certain medicines can increase your risk for breathing problems. Follow instructions from your health care provider about wearing your sleep device: ? Anytime you are sleeping, including during daytime naps. ? While taking prescription pain medicines, sleeping medicines, or medicines that make you drowsy.  Return to  your normal activities as told by your health care provider. Ask your health care provider what activities are safe for you.  Take over-the-counter and prescription medicines only as told by your health care provider.  If you smoke, do not smoke without supervision.  Keep all follow-up visits as told by your health care provider. This is important. Contact a health care provider if:  You have nausea or vomiting that does not get better with medicine.  You cannot eat or drink without vomiting.  You have pain that does not get better with medicine.  You are unable to pass urine.  You develop a skin rash.  You have a fever.  You have redness around your IV site that gets worse. Get help right away if:  You have difficulty breathing.  You have chest pain.  You have blood in your urine or stool, or you vomit blood. Summary  After the procedure, it is common to have a sore throat or nausea. It is also common to feel tired.  Have a responsible adult stay with you for the first 24 hours after general anesthesia. It is important to have someone help care for you until you are awake and alert.  When you feel hungry, start by eating small amounts of foods that are soft and easy to digest (bland), such as toast. Gradually return to your regular diet.  Drink enough fluid to keep your urine pale yellow.  Return to your normal activities as told by your health care provider. Ask your health care provider what activities are safe for you. This information is not   intended to replace advice given to you by your health care provider. Make sure you discuss any questions you have with your health care provider. Document Revised: 12/07/2017 Document Reviewed: 07/20/2017 Elsevier Patient Education  2020 [employer]  

## 2020-10-22 ENCOUNTER — Encounter: Payer: Self-pay | Admitting: Gastroenterology

## 2020-10-22 ENCOUNTER — Other Ambulatory Visit: Payer: Self-pay

## 2020-10-22 ENCOUNTER — Ambulatory Visit: Payer: Medicare PPO | Admitting: Anesthesiology

## 2020-10-22 ENCOUNTER — Ambulatory Visit
Admission: RE | Admit: 2020-10-22 | Discharge: 2020-10-22 | Disposition: A | Discharge status: Home / Self Care | Payer: Medicare PPO | Attending: Gastroenterology | Admitting: Gastroenterology

## 2020-10-22 ENCOUNTER — Encounter
Admission: RE | Disposition: A | Discharge status: Home / Self Care | Payer: Self-pay | Source: Home / Self Care | Attending: Gastroenterology

## 2020-10-22 DIAGNOSIS — D500 Iron deficiency anemia secondary to blood loss (chronic): Secondary | ICD-10-CM | POA: Diagnosis not present

## 2020-10-22 DIAGNOSIS — Z7984 Long term (current) use of oral hypoglycemic drugs: Secondary | ICD-10-CM | POA: Diagnosis not present

## 2020-10-22 DIAGNOSIS — Z955 Presence of coronary angioplasty implant and graft: Secondary | ICD-10-CM | POA: Insufficient documentation

## 2020-10-22 DIAGNOSIS — Z951 Presence of aortocoronary bypass graft: Secondary | ICD-10-CM | POA: Insufficient documentation

## 2020-10-22 DIAGNOSIS — Z7982 Long term (current) use of aspirin: Secondary | ICD-10-CM | POA: Diagnosis not present

## 2020-10-22 DIAGNOSIS — K209 Esophagitis, unspecified without bleeding: Secondary | ICD-10-CM

## 2020-10-22 DIAGNOSIS — Z79899 Other long term (current) drug therapy: Secondary | ICD-10-CM | POA: Insufficient documentation

## 2020-10-22 DIAGNOSIS — D509 Iron deficiency anemia, unspecified: Secondary | ICD-10-CM | POA: Insufficient documentation

## 2020-10-22 DIAGNOSIS — Z7902 Long term (current) use of antithrombotics/antiplatelets: Secondary | ICD-10-CM | POA: Diagnosis not present

## 2020-10-22 DIAGNOSIS — Z87891 Personal history of nicotine dependence: Secondary | ICD-10-CM | POA: Diagnosis not present

## 2020-10-22 DIAGNOSIS — Z86010 Personal history of colonic polyps: Secondary | ICD-10-CM | POA: Insufficient documentation

## 2020-10-22 HISTORY — DX: Dizziness and giddiness: R42

## 2020-10-22 HISTORY — PX: ESOPHAGOGASTRODUODENOSCOPY (EGD) WITH PROPOFOL: SHX5813

## 2020-10-22 HISTORY — PX: COLONOSCOPY WITH PROPOFOL: SHX5780

## 2020-10-22 LAB — GLUCOSE, CAPILLARY
Glucose-Capillary: 162 mg/dL — ABNORMAL HIGH (ref 70–99)
Glucose-Capillary: 140 mg/dL — ABNORMAL HIGH (ref 70–99)

## 2020-10-22 SURGERY — COLONOSCOPY WITH PROPOFOL
Anesthesia: General

## 2020-10-22 MED ORDER — LACTATED RINGERS IV SOLN: INTRAVENOUS | Status: DC

## 2020-10-22 MED ORDER — LIDOCAINE HCL (CARDIAC) PF 100 MG/5ML IV SOSY
PREFILLED_SYRINGE | INTRAVENOUS | Status: DC | PRN
  Administered 2020-10-22: 50 mg via INTRAVENOUS

## 2020-10-22 MED ORDER — PROPOFOL 10 MG/ML IV BOLUS
INTRAVENOUS | Status: DC | PRN
  Administered 2020-10-22 (×2): 50 mg via INTRAVENOUS
  Administered 2020-10-22: 100 mg via INTRAVENOUS

## 2020-10-22 MED ORDER — STERILE WATER FOR IRRIGATION IR SOLN: Status: DC | PRN

## 2020-10-22 MED ORDER — GLYCOPYRROLATE 0.2 MG/ML IJ SOLN
INTRAMUSCULAR | Status: DC | PRN
  Administered 2020-10-22: .2 mg via INTRAVENOUS

## 2020-10-22 SURGICAL SUPPLY — 37 items
BALLN DILATOR 10-12 8 (BALLOONS)
BALLN DILATOR 12-15 8 (BALLOONS)
BALLN DILATOR 15-18 8 (BALLOONS)
BALLN DILATOR CRE 0-12 8 (BALLOONS)
BALLN DILATOR ESOPH 8 10 CRE (MISCELLANEOUS) IMPLANT
BALLOON DILATOR 12-15 8 (BALLOONS) IMPLANT
BALLOON DILATOR 15-18 8 (BALLOONS) IMPLANT
BALLOON DILATOR CRE 0-12 8 (BALLOONS) IMPLANT
BLOCK BITE 60FR ADLT L/F GRN (MISCELLANEOUS) ×3 IMPLANT
CLIP HMST 235XBRD CATH ROT (MISCELLANEOUS) IMPLANT
CLIP RESOLUTION 360 11X235 (MISCELLANEOUS)
ELECT REM PT RETURN 9FT ADLT (ELECTROSURGICAL)
ELECTRODE REM PT RTRN 9FT ADLT (ELECTROSURGICAL) IMPLANT
FCP ESCP3.2XJMB 240X2.8X (MISCELLANEOUS)
FORCEPS BIOP RAD 4 LRG CAP 4 (CUTTING FORCEPS) IMPLANT
FORCEPS BIOP RJ4 240 W/NDL (MISCELLANEOUS)
FORCEPS ESCP3.2XJMB 240X2.8X (MISCELLANEOUS) IMPLANT
GOWN CVR UNV OPN BCK APRN NK (MISCELLANEOUS) ×2 IMPLANT
GOWN ISOL THUMB LOOP REG UNIV (MISCELLANEOUS) ×6
INJECTOR VARIJECT VIN23 (MISCELLANEOUS) IMPLANT
KIT DEFENDO VALVE AND CONN (KITS) IMPLANT
KIT PRC NS LF DISP ENDO (KITS) ×1 IMPLANT
KIT PROCEDURE OLYMPUS (KITS) ×3
MANIFOLD NEPTUNE II (INSTRUMENTS) ×3 IMPLANT
MARKER SPOT ENDO TATTOO 5ML (MISCELLANEOUS) IMPLANT
PROBE APC STR FIRE (PROBE) IMPLANT
RETRIEVER NET PLAT FOOD (MISCELLANEOUS) IMPLANT
RETRIEVER NET ROTH 2.5X230 LF (MISCELLANEOUS) IMPLANT
SNARE SHORT THROW 13M SML OVAL (MISCELLANEOUS) IMPLANT
SNARE SHORT THROW 30M LRG OVAL (MISCELLANEOUS) IMPLANT
SNARE SNG USE RND 15MM (INSTRUMENTS) IMPLANT
SPOT EX ENDOSCOPIC TATTOO (MISCELLANEOUS)
SYR INFLATION 60ML (SYRINGE) IMPLANT
TRAP ETRAP POLY (MISCELLANEOUS) IMPLANT
VARIJECT INJECTOR VIN23 (MISCELLANEOUS)
WATER STERILE IRR 250ML POUR (IV SOLUTION) ×3 IMPLANT
WIRE CRE 18-20MM 8CM F G (MISCELLANEOUS) IMPLANT

## 2020-10-22 NOTE — Anesthesia Postprocedure Evaluation (Signed)
Anesthesia Post Note  Patient: Kenneth Duarte  Procedure(s) Performed: COLONOSCOPY CANCELED (N/A ) ESOPHAGOGASTRODUODENOSCOPY (EGD) WITH PROPOFOL (N/A )     Patient location during evaluation: PACU Anesthesia Type: General Level of consciousness: awake and alert Pain management: pain level controlled Vital Signs Assessment: post-procedure vital signs reviewed and stable Respiratory status: spontaneous breathing, nonlabored ventilation, respiratory function stable and patient connected to nasal cannula oxygen Cardiovascular status: blood pressure returned to baseline and stable Postop Assessment: no apparent nausea or vomiting Anesthetic complications: no   No complications documented.  [name]

## 2020-10-22 NOTE — Interval H&P Note (Signed)
Lucilla Lame, MD [address] [place], [place] [address] Phone:[phone] Fax : [phone]  Primary Care Physician:  Ellamae Sia, MD Primary Gastroenterologist:  Dr. Allen Norris  Pre-Procedure History & Physical: HPI:  Kenneth Duarte is a 74 y.o. male is here for an endoscopy and colonoscopy.   Past Medical History:  Diagnosis Date   Arthritis    Chronic pain syndrome    Congestive heart failure ([place]) 1980   Coronary artery disease    a. s/p 3V CABG (03/2017). b. NSTEMI 06/2017 with progressive graft disease, s/p DES to SVG-OM.   Diabetes ([place])    DIET   Dyspnea    with exertion   ED (erectile dysfunction)    GERD (gastroesophageal reflux disease)    Gout    Headache    Hyperlipemia    Hypertension    CONTROLLED ON MEDS   IDA (iron deficiency anemia) 10/21/2019   Neuropathy of both feet    Seborrheic keratosis    Sinus congestion    Vertigo    Wears dentures    PARTIAL UPPER    Past Surgical History:  Procedure Laterality Date   COLONOSCOPY WITH PROPOFOL N/A 10/25/2015   Procedure: COLONOSCOPY WITH PROPOFOL;  Surgeon: Lucilla Lame, MD;  Location: [facility];  Service: Endoscopy;  Laterality: N/A;  DIABETIC-ORAL MEDS   CORONARY ARTERY BYPASS GRAFT N/A 04/10/2017   Procedure: CORONARY ARTERY BYPASS GRAFTING (CABG) x three , using left internal mammary artery and right leg greater saphenous vein harvested endoscopically;  Surgeon: Ivin Poot, MD;  Location: [facility];  Service: Open Heart Surgery;  Laterality: N/A;   CORONARY ARTERY BYPASS GRAFT     CORONARY STENT INTERVENTION N/A 06/19/2017   Procedure: Coronary Stent Intervention;  Surgeon: Jettie Booze, MD;  Location: [facility];  Service: Cardiovascular;  Laterality: N/A;   KNEE SURGERY Right    over 20 years ago   LEFT HEART CATH AND CORONARY ANGIOGRAPHY Left 02/22/2017   Procedure: Left Heart Cath and Coronary Angiography;  Surgeon: Corey Skains, MD;  Location: [facility];  Service: Cardiovascular;  Laterality: Left;   LEFT HEART CATH AND CORS/GRAFTS ANGIOGRAPHY N/A 06/18/2017   Procedure: Left Heart Cath and Cors/Grafts Angiography and PCI;  Surgeon: Isaias Cowman, MD;  Location: [facility];  Service: Cardiovascular;  Laterality: N/A;   POLYPECTOMY  10/25/2015   Procedure: POLYPECTOMY;  Surgeon: Lucilla Lame, MD;  Location: [facility];  Service: Endoscopy;;   ROTATOR CUFF REPAIR Right 2012   TEE WITHOUT CARDIOVERSION N/A 04/10/2017   Procedure: TRANSESOPHAGEAL ECHOCARDIOGRAM (TEE);  Surgeon: Ivin Poot, MD;  Location: [facility];  Service: Open Heart Surgery;  Laterality: N/A;    Prior to Admission medications   Medication Sig Start Date End Date Taking? Authorizing Provider  ACCU-CHEK AVIVA PLUS test strip  11/20/19  Yes [provider]  Accu-Chek Softclix Lancets lancets  11/20/19  Yes [provider]  acetaminophen (TYLENOL) 500 MG tablet Take 2 tablets (1,000 mg total) by mouth every 6 (six) hours as needed. Patient taking differently: Take 1,000 mg by mouth every 6 (six) hours as needed for mild pain, fever or headache.  04/15/17  Yes Barrett, Erin R, PA-C  albuterol (PROVENTIL HFA;VENTOLIN HFA) 108 (90 Base) MCG/ACT inhaler Inhale into the lungs.   Yes [provider]  Alcohol Swabs (B-D SINGLE USE SWABS REGULAR) PADS  11/17/19  Yes [provider]  amLODipine (NORVASC) 5 MG  tablet TAKE 1 TABLET EVERY DAY 06/25/20  Yes Gollan, Kathlene November, MD  aspirin 81 MG tablet Take 1 tablet (81 mg total) by mouth daily. 06/20/17  Yes Dunn, Dayna N, PA-C  atorvastatin (LIPITOR) 80 MG tablet TAKE 1 TABLET AT BEDTIME 08/02/20  Yes Gollan, Kathlene November, MD  carvedilol (COREG) 12.5 MG tablet TAKE 1 TABLET TWICE DAILY WITH A MEAL 07/29/20  Yes Gollan, Kathlene November, MD  docusate sodium (COLACE) 100 MG capsule Take 100 mg by mouth 2 (two) times daily as needed.    Yes [provider]  FEROSUL 325 (65 Fe) MG  tablet TAKE 1 TABLET EVERY DAY 01/01/20  Yes Earlie Server, MD  fluticasone [facility]) 50 MCG/ACT nasal spray Place into the nose.   Yes [provider]  furosemide (LASIX) 20 MG tablet TAKE 1 TABLET EVERY DAY 08/02/20  Yes Gollan, Kathlene November, MD  gabapentin (NEURONTIN) 800 MG tablet Take 1,600 mg by mouth 3 (three) times daily.   Yes [provider]  lisinopril (ZESTRIL) 20 MG tablet TAKE 1 TABLET EVERY DAY 12/26/19  Yes Gollan, Kathlene November, MD  meclizine (ANTIVERT) 25 MG tablet TAKE 1 TABLET TWICE DAILY AS NEEDED FOR DIZZINESS 08/02/20  Yes Gollan, Kathlene November, MD  metFORMIN (GLUCOPHAGE-XR) 500 MG 24 hr tablet Take 500 mg by mouth 2 (two) times daily.  05/29/17  Yes [provider]  Na Sulfate-K Sulfate-Mg Sulf (SUPREP BOWEL PREP KIT) 17.5-3.13-1.6 GM/177ML SOLN Take 1 kit by mouth as directed. 10/13/20  Yes Lucilla Lame, MD  nitroGLYCERIN (NITROSTAT) 0.4 MG SL tablet Place 1 tablet (0.4 mg total) under the tongue every 5 (five) minutes as needed for chest pain. 05/11/20  Yes Gollan, Kathlene November, MD  oxyCODONE-acetaminophen (PERCOCET) 10-325 MG tablet Take 1 tablet by mouth every 4 (four) hours as needed for pain. 04/15/17  Yes Barrett, Erin R, PA-C  pantoprazole (PROTONIX) 40 MG tablet TAKE 1 TABLET EVERY DAY 08/02/20  Yes Gollan, Kathlene November, MD  vitamin B-12 (CYANOCOBALAMIN) 1000 MCG tablet  09/17/19  Yes [provider]  VITAMIN D-1000 MAX ST 25 MCG (1000 UT) tablet Take 1,000 Units by mouth daily. 07/22/20  Yes [provider]  clopidogrel (PLAVIX) 75 MG tablet TAKE 1 TABLET EVERY DAY 06/02/20   Minna Merritts, MD  valACYclovir (VALTREX) 1000 MG tablet  01/06/20   [provider]    Allergies as of 10/13/2020 - Review Complete 10/13/2020  Allergen Reaction Noted   Blood-group specific substance Other (See Comments) 06/18/2017   Cortisone acetate [cortisone] Rash and Other (See Comments) 10/12/2014    Family History  Problem Relation Age of Onset   Cancer  Father    Heart disease Father    Heart attack Father    Cancer Mother    Heart disease Brother    Diabetes Brother    Prostate cancer Brother    Diabetes Sister    Bladder Cancer Neg Hx    Kidney disease Neg Hx     Social History   Socioeconomic History   Marital status: Married    Spouse name: Not on file   Number of children: Not on file   Years of education: Not on file   Highest education level: Not on file  Occupational History   Not on file  Tobacco Use   Smoking status: Former Smoker    Packs/day: 2.00    Years: 15.00    Pack years: 30.00    Types: Cigarettes    Quit date: 06/30/1979  Years since quitting: 41.3   Smokeless tobacco: Never Used  Vaping Use   Vaping Use: Never used  Substance and Sexual Activity   Alcohol use: No    Alcohol/week: 0.0 standard drinks    Comment: QUIT IN 1980   Drug use: No   Sexual activity: Not on file  Other Topics Concern   Not on file  Social History Narrative   Not on file   Social Determinants of Health   Financial Resource Strain:    Difficulty of Paying Living Expenses: Not on file  Food Insecurity:    Worried About [place] in the Last Year: Not on file   Ran Out of Food in the Last Year: Not on file  Transportation Needs:    Lack of Transportation (Medical): Not on file   Lack of Transportation (Non-Medical): Not on file  Physical Activity:    Days of Exercise per Week: Not on file   Minutes of Exercise per Session: Not on file  Stress:    Feeling of Stress : Not on file  Social Connections:    Frequency of Communication with Friends and Family: Not on file   Frequency of Social Gatherings with Friends and Family: Not on file   Attends Religious Services: Not on file   Active Member of Clubs or Organizations: Not on file   Attends Archivist Meetings: Not on file   Marital Status: Not on file  Intimate Partner Violence:    Fear of Current or Ex-Partner: Not on file   Emotionally  Abused: Not on file   Physically Abused: Not on file   Sexually Abused: Not on file    Review of Systems: See HPI, otherwise negative ROS  Physical Exam: BP (!) 178/79   Pulse 80   Temp 97.7 F (36.5 C) (Temporal)   Ht _0  (1.854 m)   Wt 83.9 kg   SpO2 100%   BMI 24.41 kg/m  General:   Alert,  pleasant and cooperative in NAD Head:  Normocephalic and atraumatic. Neck:  Supple; no masses or thyromegaly. Lungs:  Clear throughout to auscultation.    Heart:  Regular rate and rhythm. Abdomen:  Soft, nontender and nondistended. Normal bowel sounds, without guarding, and without rebound.   Neurologic:  Alert and  oriented x4;  grossly normal neurologically.  Impression/Plan: Kenneth Duarte is here for an endoscopy and colonoscopy to be performed for anemia  Risks, benefits, limitations, and alternatives regarding  endoscopy and colonoscopy have been reviewed with the patient.  Questions have been answered.  All parties agreeable.   Lucilla Lame, MD  10/22/2020, 7:52 AM

## 2020-10-22 NOTE — Anesthesia Procedure Notes (Signed)
Procedure Name: General with mask airway Date/Time: 10/22/2020 8:34 AM Performed by: Jeannene Patella, CRNA Pre-anesthesia Checklist: Timeout performed, Patient being monitored, Suction available, Emergency Drugs available and Patient identified Patient Re-evaluated:Patient Re-evaluated prior to induction Oxygen Delivery Method: Nasal cannula

## 2020-10-22 NOTE — Op Note (Signed)
[facility] Gastroenterology Patient Name: Kenneth Duarte Procedure Date: 10/22/2020 8:26 AM MRN: [id number] Account #: [id number] Date of Birth: [birth year] Admit Type: Outpatient Age: 74 Room: [facility] OR ROOM 01 Gender: Male Note Status: Finalized Procedure:             Upper GI endoscopy Indications:           Iron deficiency anemia Providers:             Lucilla Lame MD, MD Referring MD:          Remus Blake MD, MD (Referring MD) Medicines:             Propofol per Anesthesia Complications:         No immediate complications. Procedure:             Pre-Anesthesia Assessment:                        - Prior to the procedure, a History and Physical was                         performed, and patient medications and allergies were                         reviewed. The patient's tolerance of previous                         anesthesia was also reviewed. The risks and benefits                         of the procedure and the sedation options and risks                         were discussed with the patient. All questions were                         answered, and informed consent was obtained. Prior                         Anticoagulants: The patient has taken no previous                         anticoagulant or antiplatelet agents. ASA Grade                         Assessment: II - A patient with mild systemic disease.                         After reviewing the risks and benefits, the patient                         was deemed in satisfactory condition to undergo the                         procedure.                        After obtaining informed consent, the endoscope was  passed under direct vision. Throughout the procedure,                         the patient's blood pressure, pulse, and oxygen                         saturations were monitored continuously. The was                         introduced through the mouth, and advanced to the                          second part of duodenum. The upper GI endoscopy was                         accomplished without difficulty. The patient tolerated                         the procedure well. Findings:      LA Grade A (one or more mucosal breaks less than 5 mm, not extending       between tops of 2 mucosal folds) esophagitis with no bleeding was found       in the middle third of the esophagus. Biopsies were taken with a cold       forceps for histology.      The stomach was normal.      The examined duodenum was normal. Impression:            - LA Grade A esophagitis with no bleeding. Biopsied.                        - Normal stomach.                        - Normal examined duodenum. Recommendation:        - Discharge patient to home.                        - Resume previous diet.                        - Continue present medications.                        - Await pathology results.                        - Perform a colonoscopy today. Procedure Code(s):     --- Professional ---                        [phone], Esophagogastroduodenoscopy, flexible,                         transoral; with biopsy, single or multiple Diagnosis Code(s):     --- Professional ---                        D50.9, Iron deficiency anemia, unspecified                        K20.90, Esophagitis, unspecified  without bleeding CPT copyright 2019 American Medical Association. All rights reserved. The codes documented in this report are preliminary and upon coder review may  be revised to meet current compliance requirements. Lucilla Lame MD, MD 10/22/2020 8:37:22 AM This report has been signed electronically. Number of Addenda: 0 Note Initiated On: 10/22/2020 8:26 AM Total Procedure Duration: 0 hours 3 minutes 32 seconds  Estimated Blood Loss:  Estimated blood loss: none.      [facility]

## 2020-10-22 NOTE — Transfer of Care (Signed)
Immediate Anesthesia Transfer of Care Note  Patient: Kenneth Duarte  Procedure(s) Performed: COLONOSCOPY WITH PROPOFOL (N/A ) ESOPHAGOGASTRODUODENOSCOPY (EGD) WITH PROPOFOL (N/A )  Patient Location: PACU  Anesthesia Type: General  Level of Consciousness: awake, alert  and patient cooperative  Airway and Oxygen Therapy: Patient Spontanous Breathing and Patient connected to supplemental oxygen  Post-op Assessment: Post-op Vital signs reviewed, Patient's Cardiovascular Status Stable, Respiratory Function Stable, Patent Airway and No signs of Nausea or vomiting  Post-op Vital Signs: Reviewed and stable  Complications: No complications documented.

## 2020-10-22 NOTE — Op Note (Signed)
[facility] Gastroenterology Patient Name: Kenneth Duarte Procedure Date: 10/22/2020 8:25 AM MRN: [id number] Account #: [id number] Date of Birth: 1946/09/26 Admit Type: Outpatient Age: 74 Room: [facility] OR ROOM 01 Gender: Male Note Status: Finalized Procedure:             Colonoscopy Indications:           Iron deficiency anemia Providers:             Lucilla Lame MD, MD Referring MD:          Remus Blake MD, MD (Referring MD) Medicines:             Propofol per Anesthesia Complications:         No immediate complications. Procedure:             Pre-Anesthesia Assessment:                        - Prior to the procedure, a History and Physical was                         performed, and patient medications and allergies were                         reviewed. The patient's tolerance of previous                         anesthesia was also reviewed. The risks and benefits                         of the procedure and the sedation options and risks                         were discussed with the patient. All questions were                         answered, and informed consent was obtained. Prior                         Anticoagulants: The patient has taken no previous                         anticoagulant or antiplatelet agents. ASA Grade                         Assessment: II - A patient with mild systemic disease.                         After reviewing the risks and benefits, the patient                         was deemed in satisfactory condition to undergo the                         procedure.                        After obtaining informed consent, the colonoscope was  passed under direct vision. Throughout the procedure,                         the patient's blood pressure, pulse, and oxygen                         saturations were monitored continuously. The was                         introduced through the anus with the intention of                          advancing to the cecum. The scope was advanced to the                         transverse colon before the procedure was aborted.                         Medications were given. The colonoscopy was performed                         without difficulty. The patient tolerated the                         procedure well. The quality of the bowel preparation                         was 80 percent obscured. Findings:      The perianal and digital rectal examinations were normal.      A large amount of stool was found in the descending colon and in the       transverse colon, precluding visualization. Impression:            - Stool in the descending colon and in the transverse                         colon.                        - No specimens collected. Recommendation:        - Discharge patient to home.                        - Resume previous diet.                        - Continue present medications.                        - Repeat colonoscopy because the bowel preparation was                         poor. Procedure Code(s):     --- Professional ---                        [phone], 53, Colonoscopy, flexible; diagnostic,                         including collection of specimen(s) by brushing or  washing, when performed (separate procedure) Diagnosis Code(s):     --- Professional ---                        D50.9, Iron deficiency anemia, unspecified CPT copyright 2019 American Medical Association. All rights reserved. The codes documented in this report are preliminary and upon coder review may  be revised to meet current compliance requirements. Lucilla Lame MD, MD 10/22/2020 8:43:29 AM This report has been signed electronically. Number of Addenda: 0 Note Initiated On: 10/22/2020 8:25 AM Total Procedure Duration: 0 hours 2 minutes 12 seconds  Estimated Blood Loss:  Estimated blood loss: none.      [facility]

## 2020-10-22 NOTE — Anesthesia Preprocedure Evaluation (Signed)
Anesthesia Evaluation  Patient identified by MRN, date of birth, ID band Patient awake    Reviewed: Allergy & Precautions, H&P , NPO status , Patient's Chart, lab work & pertinent test results, reviewed documented beta blocker date and time   Airway Mallampati: II  TM Distance: >3 FB Neck ROM: full    Dental  (+) Partial Upper   Pulmonary COPD, former smoker,    Pulmonary exam normal breath sounds clear to auscultation       Cardiovascular Exercise Tolerance: Good hypertension, + CAD, + Past MI and +CHF   Rhythm:regular Rate:Normal     Neuro/Psych negative neurological ROS  negative psych ROS   GI/Hepatic Neg liver ROS, GERD  Controlled,  Endo/Other  diabetes  Renal/GU negative Renal ROS  negative genitourinary   Musculoskeletal negative musculoskeletal ROS (+) Arthritis ,   Abdominal   Peds negative pediatric ROS (+)  Hematology negative hematology ROS (+)   Anesthesia Other Findings   Reproductive/Obstetrics negative OB ROS                             Anesthesia Physical Anesthesia Plan  ASA: II  Anesthesia Plan: General   Post-op Pain Management:    Induction:   PONV Risk Score and Plan:   Airway Management Planned:   Additional Equipment:   Intra-op Plan:   Post-operative Plan:   Informed Consent: I have reviewed the patients History and Physical, chart, labs and discussed the procedure including the risks, benefits and alternatives for the proposed anesthesia with the patient or authorized representative who has indicated his/her understanding and acceptance.     Dental Advisory Given  Plan Discussed with: CRNA  Anesthesia Plan Comments:         Anesthesia Quick Evaluation

## 2020-10-25 ENCOUNTER — Encounter: Payer: Self-pay | Admitting: Gastroenterology

## 2020-10-25 ENCOUNTER — Encounter
Admission: RE | Disposition: A | Discharge status: Home / Self Care | Payer: Self-pay | Source: Home / Self Care | Attending: Gastroenterology

## 2020-10-25 ENCOUNTER — Ambulatory Visit: Payer: Medicare PPO | Admitting: Anesthesiology

## 2020-10-25 ENCOUNTER — Other Ambulatory Visit: Payer: Self-pay

## 2020-10-25 ENCOUNTER — Ambulatory Visit
Admission: RE | Admit: 2020-10-25 | Discharge: 2020-10-25 | Disposition: A | Discharge status: Home / Self Care | Payer: Medicare PPO | Attending: Gastroenterology | Admitting: Gastroenterology

## 2020-10-25 DIAGNOSIS — D500 Iron deficiency anemia secondary to blood loss (chronic): Secondary | ICD-10-CM

## 2020-10-25 DIAGNOSIS — E119 Type 2 diabetes mellitus without complications: Secondary | ICD-10-CM | POA: Insufficient documentation

## 2020-10-25 DIAGNOSIS — Z86010 Personal history of colonic polyps: Secondary | ICD-10-CM | POA: Diagnosis not present

## 2020-10-25 DIAGNOSIS — Z8042 Family history of malignant neoplasm of prostate: Secondary | ICD-10-CM | POA: Insufficient documentation

## 2020-10-25 DIAGNOSIS — Z7984 Long term (current) use of oral hypoglycemic drugs: Secondary | ICD-10-CM | POA: Insufficient documentation

## 2020-10-25 DIAGNOSIS — Z8249 Family history of ischemic heart disease and other diseases of the circulatory system: Secondary | ICD-10-CM | POA: Insufficient documentation

## 2020-10-25 DIAGNOSIS — I2510 Atherosclerotic heart disease of native coronary artery without angina pectoris: Secondary | ICD-10-CM | POA: Diagnosis not present

## 2020-10-25 DIAGNOSIS — I509 Heart failure, unspecified: Secondary | ICD-10-CM | POA: Insufficient documentation

## 2020-10-25 DIAGNOSIS — Z955 Presence of coronary angioplasty implant and graft: Secondary | ICD-10-CM | POA: Insufficient documentation

## 2020-10-25 DIAGNOSIS — I110 Hypertensive heart disease with heart failure: Secondary | ICD-10-CM | POA: Insufficient documentation

## 2020-10-25 DIAGNOSIS — D509 Iron deficiency anemia, unspecified: Secondary | ICD-10-CM | POA: Insufficient documentation

## 2020-10-25 DIAGNOSIS — Z87891 Personal history of nicotine dependence: Secondary | ICD-10-CM | POA: Diagnosis not present

## 2020-10-25 DIAGNOSIS — Z951 Presence of aortocoronary bypass graft: Secondary | ICD-10-CM | POA: Diagnosis not present

## 2020-10-25 DIAGNOSIS — Z7951 Long term (current) use of inhaled steroids: Secondary | ICD-10-CM | POA: Diagnosis not present

## 2020-10-25 DIAGNOSIS — K640 First degree hemorrhoids: Secondary | ICD-10-CM | POA: Insufficient documentation

## 2020-10-25 DIAGNOSIS — Z809 Family history of malignant neoplasm, unspecified: Secondary | ICD-10-CM | POA: Insufficient documentation

## 2020-10-25 DIAGNOSIS — Z79899 Other long term (current) drug therapy: Secondary | ICD-10-CM | POA: Insufficient documentation

## 2020-10-25 DIAGNOSIS — Z833 Family history of diabetes mellitus: Secondary | ICD-10-CM | POA: Insufficient documentation

## 2020-10-25 DIAGNOSIS — Z7982 Long term (current) use of aspirin: Secondary | ICD-10-CM | POA: Insufficient documentation

## 2020-10-25 DIAGNOSIS — G894 Chronic pain syndrome: Secondary | ICD-10-CM | POA: Insufficient documentation

## 2020-10-25 HISTORY — PX: COLONOSCOPY WITH PROPOFOL: SHX5780

## 2020-10-25 LAB — SURGICAL PATHOLOGY

## 2020-10-25 LAB — GLUCOSE, CAPILLARY
Glucose-Capillary: 139 mg/dL — ABNORMAL HIGH (ref 70–99)
Glucose-Capillary: 121 mg/dL — ABNORMAL HIGH (ref 70–99)

## 2020-10-25 SURGERY — COLONOSCOPY WITH PROPOFOL
Anesthesia: General | Site: Rectum

## 2020-10-25 MED ORDER — PROPOFOL 10 MG/ML IV BOLUS
INTRAVENOUS | Status: DC | PRN
  Administered 2020-10-25: 20 mg via INTRAVENOUS
  Administered 2020-10-25: 30 mg via INTRAVENOUS
  Administered 2020-10-25: 50 mg via INTRAVENOUS
  Administered 2020-10-25: 20 mg via INTRAVENOUS
  Administered 2020-10-25: 100 mg via INTRAVENOUS

## 2020-10-25 MED ORDER — STERILE WATER FOR IRRIGATION IR SOLN: Status: DC | PRN

## 2020-10-25 MED ORDER — LIDOCAINE HCL (CARDIAC) PF 100 MG/5ML IV SOSY
PREFILLED_SYRINGE | INTRAVENOUS | Status: DC | PRN
  Administered 2020-10-25: 50 mg via INTRAVENOUS

## 2020-10-25 MED ORDER — ACETAMINOPHEN 325 MG PO TABS: 325.0000 mg | ORAL_TABLET | Freq: Once | ORAL | Status: DC

## 2020-10-25 MED ORDER — LACTATED RINGERS IV SOLN: INTRAVENOUS | Status: DC

## 2020-10-25 MED ORDER — ACETAMINOPHEN 160 MG/5ML PO SOLN: 325.0000 mg | Freq: Once | ORAL | Status: DC

## 2020-10-25 SURGICAL SUPPLY — 6 items
GOWN CVR UNV OPN BCK APRN NK (MISCELLANEOUS) ×2 IMPLANT
GOWN ISOL THUMB LOOP REG UNIV (MISCELLANEOUS) ×6
KIT PRC NS LF DISP ENDO (KITS) ×1 IMPLANT
KIT PROCEDURE OLYMPUS (KITS) ×3
MANIFOLD NEPTUNE II (INSTRUMENTS) ×3 IMPLANT
WATER STERILE IRR 250ML POUR (IV SOLUTION) ×3 IMPLANT

## 2020-10-25 NOTE — Op Note (Signed)
[facility] Gastroenterology Patient Name: Kenneth Duarte Procedure Date: 10/25/2020 10:30 AM MRN: [id number] Account #: [id number] Date of Birth: 07/06/1946 Admit Type: Outpatient Age: 74 Room: [facility] OR ROOM 01 Gender: Male Note Status: Finalized Procedure:             Colonoscopy Indications:           Iron deficiency anemia Providers:             Lucilla Lame MD, MD Referring MD:          Remus Blake MD, MD (Referring MD) Medicines:             Propofol per Anesthesia Complications:         No immediate complications. Procedure:             Pre-Anesthesia Assessment:                        - Prior to the procedure, a History and Physical was                         performed, and patient medications and allergies were                         reviewed. The patient's tolerance of previous                         anesthesia was also reviewed. The risks and benefits                         of the procedure and the sedation options and risks                         were discussed with the patient. All questions were                         answered, and informed consent was obtained. Prior                         Anticoagulants: The patient has taken no previous                         anticoagulant or antiplatelet agents. ASA Grade                         Assessment: II - A patient with mild systemic disease.                         After reviewing the risks and benefits, the patient                         was deemed in satisfactory condition to undergo the                         procedure.                        After obtaining informed consent, the colonoscope was  passed under direct vision. Throughout the procedure,                         the patient's blood pressure, pulse, and oxygen                         saturations were monitored continuously. The                         Colonoscope was introduced through the anus and                          advanced to the the cecum, identified by appendiceal                         orifice and ileocecal valve. The colonoscopy was                         performed without difficulty. The patient tolerated                         the procedure well. The quality of the bowel                         preparation was good. Findings:      The perianal and digital rectal examinations were normal.      Non-bleeding internal hemorrhoids were found during retroflexion. The       hemorrhoids were Grade I (internal hemorrhoids that do not prolapse).      The exam was otherwise without abnormality. Impression:            - Non-bleeding internal hemorrhoids.                        - The examination was otherwise normal.                        - No specimens collected. Recommendation:        - Discharge patient to home.                        - Resume previous diet.                        - Continue present medications.                        - To visualize the small bowel, perform video capsule                         endoscopy at appointment to be scheduled. Procedure Code(s):     --- Professional ---                        [phone], Colonoscopy, flexible; diagnostic, including                         collection of specimen(s) by brushing or washing, when                         performed (  separate procedure) Diagnosis Code(s):     --- Professional ---                        D50.9, Iron deficiency anemia, unspecified CPT copyright 2019 American Medical Association. All rights reserved. The codes documented in this report are preliminary and upon coder review may  be revised to meet current compliance requirements. Lucilla Lame MD, MD 10/25/2020 10:52:00 AM This report has been signed electronically. Number of Addenda: 0 Note Initiated On: 10/25/2020 10:30 AM Scope Withdrawal Time: 0 hours 6 minutes 29 seconds  Total Procedure Duration: 0 hours 10 minutes 23 seconds  Estimated Blood Loss:   Estimated blood loss: none.      [facility]

## 2020-10-25 NOTE — Transfer of Care (Signed)
Immediate Anesthesia Transfer of Care Note  Patient: Kenneth Duarte  Procedure(s) Performed: COLONOSCOPY WITH PROPOFOL (N/A Rectum)  Patient Location: PACU  Anesthesia Type: General  Level of Consciousness: awake, alert  and patient cooperative  Airway and Oxygen Therapy: Patient Spontanous Breathing and Patient connected to supplemental oxygen  Post-op Assessment: Post-op Vital signs reviewed, Patient's Cardiovascular Status Stable, Respiratory Function Stable, Patent Airway and No signs of Nausea or vomiting  Post-op Vital Signs: Reviewed and stable  Complications: No complications documented.

## 2020-10-25 NOTE — Interval H&P Note (Signed)
Lucilla Lame, MD [address] [place], [place] [address] Phone:[phone] Fax : [phone]  Primary Care Physician:  Ellamae Sia, MD Primary Gastroenterologist:  Dr. Allen Norris  Pre-Procedure History & Physical: HPI:  Kenneth Duarte is a 74 y.o. male is here for an colonoscopy.   Past Medical History:  Diagnosis Date   Arthritis    Chronic pain syndrome    Congestive heart failure ([place]) 1980   Coronary artery disease    a. s/p 3V CABG (03/2017). b. NSTEMI 06/2017 with progressive graft disease, s/p DES to SVG-OM.   Diabetes ([place])    DIET   Dyspnea    with exertion   ED (erectile dysfunction)    GERD (gastroesophageal reflux disease)    Gout    Headache    Hyperlipemia    Hypertension    CONTROLLED ON MEDS   IDA (iron deficiency anemia) 10/21/2019   Neuropathy of both feet    Seborrheic keratosis    Sinus congestion    Vertigo    Wears dentures    PARTIAL UPPER    Past Surgical History:  Procedure Laterality Date   COLONOSCOPY WITH PROPOFOL N/A 10/25/2015   Procedure: COLONOSCOPY WITH PROPOFOL;  Surgeon: Lucilla Lame, MD;  Location: [facility];  Service: Endoscopy;  Laterality: N/A;  DIABETIC-ORAL MEDS   COLONOSCOPY WITH PROPOFOL N/A 10/22/2020   Procedure: COLONOSCOPY CANCELED;  Surgeon: Lucilla Lame, MD;  Location: [facility];  Service: Endoscopy;  Laterality: N/A;  procedure aborted poor prep   CORONARY ARTERY BYPASS GRAFT N/A 04/10/2017   Procedure: CORONARY ARTERY BYPASS GRAFTING (CABG) x three , using left internal mammary artery and right leg greater saphenous vein harvested endoscopically;  Surgeon: Ivin Poot, MD;  Location: [facility];  Service: Open Heart Surgery;  Laterality: N/A;   CORONARY ARTERY BYPASS GRAFT     CORONARY STENT INTERVENTION N/A 06/19/2017   Procedure: Coronary Stent Intervention;  Surgeon: Jettie Booze, MD;  Location: [facility];  Service: Cardiovascular;  Laterality: N/A;    ESOPHAGOGASTRODUODENOSCOPY (EGD) WITH PROPOFOL N/A 10/22/2020   Procedure: ESOPHAGOGASTRODUODENOSCOPY (EGD) WITH PROPOFOL;  Surgeon: Lucilla Lame, MD;  Location: [facility];  Service: Endoscopy;  Laterality: N/A;  Diabetic - oral meds   KNEE SURGERY Right    over 20 years ago   LEFT HEART CATH AND CORONARY ANGIOGRAPHY Left 02/22/2017   Procedure: Left Heart Cath and Coronary Angiography;  Surgeon: Corey Skains, MD;  Location: [facility];  Service: Cardiovascular;  Laterality: Left;   LEFT HEART CATH AND CORS/GRAFTS ANGIOGRAPHY N/A 06/18/2017   Procedure: Left Heart Cath and Cors/Grafts Angiography and PCI;  Surgeon: Isaias Cowman, MD;  Location: [facility];  Service: Cardiovascular;  Laterality: N/A;   POLYPECTOMY  10/25/2015   Procedure: POLYPECTOMY;  Surgeon: Lucilla Lame, MD;  Location: [facility];  Service: Endoscopy;;   ROTATOR CUFF REPAIR Right 2012   TEE WITHOUT CARDIOVERSION N/A 04/10/2017   Procedure: TRANSESOPHAGEAL ECHOCARDIOGRAM (TEE);  Surgeon: Ivin Poot, MD;  Location: [facility];  Service: Open Heart Surgery;  Laterality: N/A;    Prior to Admission medications   Medication Sig Start Date End Date Taking? Authorizing Provider  acetaminophen (TYLENOL) 500 MG tablet Take 2 tablets (1,000 mg total) by mouth every 6 (six) hours as needed. Patient taking differently: Take 1,000 mg by mouth every 6 (six) hours as needed for mild pain, fever or headache.  04/15/17  Yes Barrett, Lodema Hong,  PA-C  albuterol (PROVENTIL HFA;VENTOLIN HFA) 108 (90 Base) MCG/ACT inhaler Inhale into the lungs.   Yes [provider]  Alcohol Swabs (B-D SINGLE USE SWABS REGULAR) PADS  11/17/19  Yes [provider]  amLODipine (NORVASC) 5 MG tablet TAKE 1 TABLET EVERY DAY 06/25/20  Yes Gollan, Kathlene November, MD  aspirin 81 MG tablet Take 1 tablet (81 mg total) by mouth daily. 06/20/17  Yes Dunn, Dayna N, PA-C  atorvastatin (LIPITOR) 80 MG tablet TAKE 1 TABLET AT  BEDTIME 08/02/20  Yes Gollan, Kathlene November, MD  carvedilol (COREG) 12.5 MG tablet TAKE 1 TABLET TWICE DAILY WITH A MEAL 07/29/20  Yes Gollan, Kathlene November, MD  clopidogrel (PLAVIX) 75 MG tablet TAKE 1 TABLET EVERY DAY 06/02/20  Yes Gollan, Kathlene November, MD  docusate sodium (COLACE) 100 MG capsule Take 100 mg by mouth 2 (two) times daily as needed.    Yes [provider]  FEROSUL 325 (65 Fe) MG tablet TAKE 1 TABLET EVERY DAY 01/01/20  Yes Earlie Server, MD  fluticasone [facility]) 50 MCG/ACT nasal spray Place into the nose.   Yes [provider]  furosemide (LASIX) 20 MG tablet TAKE 1 TABLET EVERY DAY 08/02/20  Yes Gollan, Kathlene November, MD  gabapentin (NEURONTIN) 800 MG tablet Take 1,600 mg by mouth 3 (three) times daily.   Yes [provider]  lisinopril (ZESTRIL) 20 MG tablet TAKE 1 TABLET EVERY DAY 12/26/19  Yes Gollan, Kathlene November, MD  meclizine (ANTIVERT) 25 MG tablet TAKE 1 TABLET TWICE DAILY AS NEEDED FOR DIZZINESS 08/02/20  Yes Gollan, Kathlene November, MD  metFORMIN (GLUCOPHAGE-XR) 500 MG 24 hr tablet Take 500 mg by mouth 2 (two) times daily.  05/29/17  Yes [provider]  Na Sulfate-K Sulfate-Mg Sulf (SUPREP BOWEL PREP KIT) 17.5-3.13-1.6 GM/177ML SOLN Take 1 kit by mouth as directed. 10/13/20  Yes Lucilla Lame, MD  oxyCODONE-acetaminophen (PERCOCET) 10-325 MG tablet Take 1 tablet by mouth every 4 (four) hours as needed for pain. 04/15/17  Yes Barrett, Erin R, PA-C  pantoprazole (PROTONIX) 40 MG tablet TAKE 1 TABLET EVERY DAY 08/02/20  Yes Gollan, Kathlene November, MD  valACYclovir (VALTREX) 1000 MG tablet  01/06/20  Yes [provider]  vitamin B-12 (CYANOCOBALAMIN) 1000 MCG tablet  09/17/19  Yes [provider]  VITAMIN D-1000 MAX ST 25 MCG (1000 UT) tablet Take 1,000 Units by mouth daily. 07/22/20  Yes [provider]  ACCU-CHEK AVIVA PLUS test strip  11/20/19   [provider]  Accu-Chek Softclix Lancets lancets  11/20/19   [provider]  nitroGLYCERIN  (NITROSTAT) 0.4 MG SL tablet Place 1 tablet (0.4 mg total) under the tongue every 5 (five) minutes as needed for chest pain. 05/11/20   Minna Merritts, MD    Allergies as of 10/22/2020 - Review Complete 10/22/2020  Allergen Reaction Noted   Blood-group specific substance Other (See Comments) 06/18/2017   Cortisone acetate [cortisone] Rash and Other (See Comments) 10/12/2014    Family History  Problem Relation Age of Onset   Cancer Father    Heart disease Father    Heart attack Father    Cancer Mother    Heart disease Brother    Diabetes Brother    Prostate cancer Brother    Diabetes Sister    Bladder Cancer Neg Hx    Kidney disease Neg Hx     Social History   Socioeconomic History   Marital status: Married    Spouse name: Not on file  Number of children: Not on file   Years of education: Not on file   Highest education level: Not on file  Occupational History   Not on file  Tobacco Use   Smoking status: Former Smoker    Packs/day: 2.00    Years: 15.00    Pack years: 30.00    Types: Cigarettes    Quit date: 06/30/1979    Years since quitting: 41.3   Smokeless tobacco: Never Used  Vaping Use   Vaping Use: Never used  Substance and Sexual Activity   Alcohol use: No    Alcohol/week: 0.0 standard drinks    Comment: QUIT IN 1980   Drug use: No   Sexual activity: Not on file  Other Topics Concern   Not on file  Social History Narrative   Not on file   Social Determinants of Health   Financial Resource Strain:    Difficulty of Paying Living Expenses: Not on file  Food Insecurity:    Worried About Charity fundraiser in the Last Year: Not on file   [employer] of Food in the Last Year: Not on file  Transportation Needs:    Lack of Transportation (Medical): Not on file   Lack of Transportation (Non-Medical): Not on file  Physical Activity:    Days of Exercise per Week: Not on file   Minutes of Exercise per Session: Not on file  Stress:    Feeling of Stress :  Not on file  Social Connections:    Frequency of Communication with Friends and Family: Not on file   Frequency of Social Gatherings with Friends and Family: Not on file   Attends Religious Services: Not on file   Active Member of Clubs or Organizations: Not on file   Attends Archivist Meetings: Not on file   Marital Status: Not on file  Intimate Partner Violence:    Fear of Current or Ex-Partner: Not on file   Emotionally Abused: Not on file   Physically Abused: Not on file   Sexually Abused: Not on file    Review of Systems: See HPI, otherwise negative ROS  Physical Exam: BP (!) 169/63   Pulse 69   Temp (!) 97 F (36.1 C) (Temporal)   Resp 18   Ht '6\' 1"'  (1.854 m)   Wt 83.9 kg   SpO2 100%   BMI 24.40 kg/m  General:   Alert,  pleasant and cooperative in NAD Head:  Normocephalic and atraumatic. Neck:  Supple; no masses or thyromegaly. Lungs:  Clear throughout to auscultation.    Heart:  Regular rate and rhythm. Abdomen:  Soft, nontender and nondistended. Normal bowel sounds, without guarding, and without rebound.   Neurologic:  Alert and  oriented x4;  grossly normal neurologically.  Impression/Plan: Kenneth Duarte is here for an colonoscopy to be performed for anemia  Risks, benefits, limitations, and alternatives regarding  colonoscopy have been reviewed with the patient.  Questions have been answered.  All parties agreeable.   Lucilla Lame, MD  10/25/2020, 10:04 AM

## 2020-10-25 NOTE — Anesthesia Procedure Notes (Signed)
Date/Time: 10/25/2020 10:39 AM Performed by: Cameron Ali, CRNA Pre-anesthesia Checklist: Patient identified, Emergency Drugs available, Suction available, Timeout performed and Patient being monitored Patient Re-evaluated:Patient Re-evaluated prior to induction Oxygen Delivery Method: Nasal cannula Placement Confirmation: positive ETCO2

## 2020-10-25 NOTE — Anesthesia Preprocedure Evaluation (Signed)
Anesthesia Evaluation  Patient identified by MRN, date of birth, ID band Patient awake    Reviewed: Allergy & Precautions, H&P , NPO status , Patient's Chart, lab work & pertinent test results, reviewed documented beta blocker date and time   Airway Mallampati: II  TM Distance: >3 FB Neck ROM: full    Dental  (+) Partial Upper, Poor Dentition   Pulmonary COPD, former smoker,    Pulmonary exam normal breath sounds clear to auscultation       Cardiovascular Exercise Tolerance: Good hypertension, + CAD, + Past MI and +CHF   Rhythm:regular Rate:Normal     Neuro/Psych  Neuromuscular disease negative psych ROS   GI/Hepatic Neg liver ROS, GERD  Controlled,  Endo/Other  diabetes  Renal/GU negative Renal ROS  negative genitourinary   Musculoskeletal negative musculoskeletal ROS (+) Arthritis ,   Abdominal   Peds negative pediatric ROS (+)  Hematology negative hematology ROS (+)   Anesthesia Other Findings   Reproductive/Obstetrics negative OB ROS                             Anesthesia Physical  Anesthesia Plan  ASA: III  Anesthesia Plan: General   Post-op Pain Management:    Induction: Intravenous  PONV Risk Score and Plan: 2 and Propofol infusion and Treatment may vary due to age or medical condition  Airway Management Planned: Natural Airway  Additional Equipment:   Intra-op Plan:   Post-operative Plan:   Informed Consent: I have reviewed the patients History and Physical, chart, labs and discussed the procedure including the risks, benefits and alternatives for the proposed anesthesia with the patient or authorized representative who has indicated his/her understanding and acceptance.     Dental Advisory Given  Plan Discussed with: CRNA  Anesthesia Plan Comments:         Anesthesia Quick Evaluation

## 2020-10-25 NOTE — Anesthesia Postprocedure Evaluation (Signed)
Anesthesia Post Note  Patient: Kenneth Duarte  Procedure(s) Performed: COLONOSCOPY WITH PROPOFOL (N/A Rectum)     Patient location during evaluation: PACU Anesthesia Type: General Level of consciousness: awake and alert and oriented Pain management: satisfactory to patient Vital Signs Assessment: post-procedure vital signs reviewed and stable Respiratory status: spontaneous breathing, nonlabored ventilation and respiratory function stable Cardiovascular status: blood pressure returned to baseline and stable Postop Assessment: Adequate PO intake and No signs of nausea or vomiting Anesthetic complications: no   No complications documented.  Raliegh Ip

## 2020-10-26 ENCOUNTER — Encounter: Payer: Self-pay | Admitting: Gastroenterology

## 2020-11-02 ENCOUNTER — Telehealth: Payer: Self-pay

## 2020-11-02 NOTE — Telephone Encounter (Signed)
Patient verbalized understanding of results  

## 2020-11-02 NOTE — Telephone Encounter (Signed)
-----   Message from Lucilla Lame, MD sent at 11/02/2020 11:49 AM EST ----- Let the patient know that the biopsies of the esophagus showed inflammation.  He should continue taking her Protonix.

## 2020-11-19 ENCOUNTER — Ambulatory Visit: Payer: Medicare PPO | Admitting: Orthotics

## 2020-11-19 ENCOUNTER — Other Ambulatory Visit: Payer: Self-pay

## 2020-11-19 DIAGNOSIS — E1142 Type 2 diabetes mellitus with diabetic polyneuropathy: Secondary | ICD-10-CM

## 2020-11-19 NOTE — Progress Notes (Signed)
Need to reorder shoes as these were falling way apart

## 2020-11-22 ENCOUNTER — Inpatient Hospital Stay: Payer: Medicare PPO | Attending: Oncology

## 2020-11-22 ENCOUNTER — Other Ambulatory Visit: Payer: Self-pay

## 2020-11-22 DIAGNOSIS — Z955 Presence of coronary angioplasty implant and graft: Secondary | ICD-10-CM | POA: Insufficient documentation

## 2020-11-22 DIAGNOSIS — Z86010 Personal history of colonic polyps: Secondary | ICD-10-CM

## 2020-11-22 DIAGNOSIS — Z87891 Personal history of nicotine dependence: Secondary | ICD-10-CM | POA: Insufficient documentation

## 2020-11-22 DIAGNOSIS — M1990 Unspecified osteoarthritis, unspecified site: Secondary | ICD-10-CM | POA: Diagnosis not present

## 2020-11-22 DIAGNOSIS — D509 Iron deficiency anemia, unspecified: Secondary | ICD-10-CM | POA: Insufficient documentation

## 2020-11-22 DIAGNOSIS — Z7984 Long term (current) use of oral hypoglycemic drugs: Secondary | ICD-10-CM | POA: Insufficient documentation

## 2020-11-22 DIAGNOSIS — G894 Chronic pain syndrome: Secondary | ICD-10-CM | POA: Diagnosis not present

## 2020-11-22 DIAGNOSIS — I2510 Atherosclerotic heart disease of native coronary artery without angina pectoris: Secondary | ICD-10-CM | POA: Diagnosis not present

## 2020-11-22 DIAGNOSIS — I252 Old myocardial infarction: Secondary | ICD-10-CM | POA: Insufficient documentation

## 2020-11-22 DIAGNOSIS — E119 Type 2 diabetes mellitus without complications: Secondary | ICD-10-CM | POA: Diagnosis not present

## 2020-11-22 DIAGNOSIS — Z7982 Long term (current) use of aspirin: Secondary | ICD-10-CM | POA: Diagnosis not present

## 2020-11-22 DIAGNOSIS — I509 Heart failure, unspecified: Secondary | ICD-10-CM | POA: Insufficient documentation

## 2020-11-22 DIAGNOSIS — Z79899 Other long term (current) drug therapy: Secondary | ICD-10-CM | POA: Insufficient documentation

## 2020-11-22 DIAGNOSIS — Z951 Presence of aortocoronary bypass graft: Secondary | ICD-10-CM | POA: Insufficient documentation

## 2020-11-22 LAB — CBC WITH DIFFERENTIAL/PLATELET
Abs Immature Granulocytes: 0.04 10*3/uL (ref 0.00–0.07)
Basophils Absolute: 0.1 10*3/uL (ref 0.0–0.1)
Basophils Relative: 1 %
Eosinophils Absolute: 0.1 10*3/uL (ref 0.0–0.5)
Eosinophils Relative: 1 %
HCT: 35.3 % — ABNORMAL LOW (ref 39.0–52.0)
Hemoglobin: 12.1 g/dL — ABNORMAL LOW (ref 13.0–17.0)
Immature Granulocytes: 1 %
Lymphocytes Relative: 32 %
Lymphs Abs: 2.6 10*3/uL (ref 0.7–4.0)
MCH: 28.9 pg (ref 26.0–34.0)
MCHC: 34.3 g/dL (ref 30.0–36.0)
MCV: 84.4 fL (ref 80.0–100.0)
Monocytes Absolute: 0.6 10*3/uL (ref 0.1–1.0)
Monocytes Relative: 7 %
Neutro Abs: 4.9 10*3/uL (ref 1.7–7.7)
Neutrophils Relative %: 58 %
Platelets: 192 10*3/uL (ref 150–400)
RBC: 4.18 MIL/uL — ABNORMAL LOW (ref 4.22–5.81)
RDW: 14 % (ref 11.5–15.5)
WBC: 8.2 10*3/uL (ref 4.0–10.5)
nRBC: 0 % (ref 0.0–0.2)

## 2020-11-22 LAB — RETIC PANEL
Immature Retic Fract: 11.2 % (ref 2.3–15.9)
RBC.: 4.04 MIL/uL — ABNORMAL LOW (ref 4.22–5.81)
Retic Count, Absolute: 60.2 10*3/uL (ref 19.0–186.0)
Retic Ct Pct: 1.5 % (ref 0.4–3.1)
Reticulocyte Hemoglobin: 32.6 pg (ref >27.9)

## 2020-11-22 LAB — IRON AND TIBC
Iron: 47 ug/dL (ref 45–182)
Saturation Ratios: 15 % — ABNORMAL LOW (ref 17.9–39.5)
TIBC: 314 ug/dL (ref 250–450)
UIBC: 267 ug/dL

## 2020-11-22 LAB — FERRITIN: Ferritin: 35 ng/mL (ref 24–336)

## 2020-11-24 ENCOUNTER — Inpatient Hospital Stay: Payer: Medicare PPO | Admitting: Oncology

## 2020-11-24 ENCOUNTER — Inpatient Hospital Stay: Payer: Medicare PPO

## 2020-11-24 ENCOUNTER — Encounter: Payer: Self-pay | Admitting: Oncology

## 2020-11-24 ENCOUNTER — Telehealth: Payer: Self-pay

## 2020-11-24 ENCOUNTER — Other Ambulatory Visit: Payer: Self-pay

## 2020-11-24 VITALS — BP 150/64 | HR 73 | Temp 98.2°F | Resp 18 | Wt 189.2 lb

## 2020-11-24 DIAGNOSIS — D509 Iron deficiency anemia, unspecified: Secondary | ICD-10-CM | POA: Diagnosis not present

## 2020-11-24 NOTE — Progress Notes (Signed)
Pt here for follow up. No new concerns voiced.   

## 2020-11-24 NOTE — Telephone Encounter (Signed)
Error

## 2020-11-24 NOTE — Progress Notes (Signed)
Hematology/Oncology  note [facility] Telephone:([phone] [phone]   Patient Care Team: Ellamae Sia, MD as PCP - General (Internal Medicine)  REFERRING PROVIDER: Ellamae Sia, MD  CHIEF COMPLAINTS/REASON FOR VISIT:  Follow-up for iron deficiency anemia  HISTORY OF PRESENTING ILLNESS:  Kenneth Duarte is a  74 y.o.  male with PMH listed below who was referred to me for evaluation of anemia Reviewed patient's recent labs that was done at [facility] clinic.  /28/2020 hemoglobin 9.7, hematocrit 29, MCV 86, platelet 179 ,000 vitamin B12 462, folate 4.6 Reviewed patient's previous labs, anemia is chronic onset , duration is since 2018 with baseline 9-10 recently. No aggravating or improving factors.  Associated signs and symptoms:  He feels pretty well.  No profound fatigue or shortness of breath.  He only feels minimal shortness of breath after climbing a flight of stairs. Denies weight loss, easy bruising, hematochezia, hemoptysis, hematuria. Context: History of GI bleeding: Denies               History of Chronic kidney disease: Denies               History of autoimmune disease: Denies               History of hemolytic anemia.  Denies               Last colonoscopy: 2016-sigmoid polyp resected  period.  He follows up with cardiology for CAD, CABG x3 Denies weight loss, fever, chills, fatigue, night sweats.   INTERVAL HISTORY Kenneth Duarte is a 74 y.o. male who has above history reviewed by me today presents for follow up visit for management of anemia Problems and complaints are listed below: Patient reports feeling well.  No new complaints.  Denies any black or bloody stool. Had colonoscopy on 10/25/2020.-Nonbleeding internal hemorrhoids.     Review of Systems  Constitutional: Negative for appetite change, chills, diaphoresis, fatigue, fever and unexpected weight change.  HENT:   Negative for hearing loss,  lump/mass, nosebleeds, sore throat and voice change.   Eyes: Negative for eye problems and icterus.  Respiratory: Negative for chest tightness, cough, hemoptysis, shortness of breath and wheezing.   Cardiovascular: Negative for chest pain and leg swelling.  Gastrointestinal: Negative for abdominal distention, abdominal pain, blood in stool, diarrhea, nausea and rectal pain.  Endocrine: Negative for hot flashes.  Genitourinary: Negative for bladder incontinence, difficulty urinating, dysuria, frequency, hematuria and nocturia.   Musculoskeletal: Negative for arthralgias, back pain, flank pain, gait problem and myalgias.  Skin: Negative for itching and rash.  Neurological: Negative for dizziness, gait problem, headaches, light-headedness, numbness and seizures.  Hematological: Negative for adenopathy. Does not bruise/bleed easily.  Psychiatric/Behavioral: Negative for confusion and decreased concentration. The patient is not nervous/anxious.      MEDICAL HISTORY:  Past Medical History:  Diagnosis Date   Arthritis    Chronic pain syndrome    Congestive heart failure ([place]) 1980   Coronary artery disease    a. s/p 3V CABG (03/2017). b. NSTEMI 06/2017 with progressive graft disease, s/p DES to SVG-OM.   Diabetes ([place])    DIET   Dyspnea    with exertion   ED (erectile dysfunction)    GERD (gastroesophageal reflux disease)    Gout    Headache    Hyperlipemia    Hypertension    CONTROLLED ON MEDS   IDA (iron deficiency anemia) 10/21/2019   Neuropathy of both feet  Seborrheic keratosis    Sinus congestion    Vertigo    Wears dentures    PARTIAL UPPER    SURGICAL HISTORY: Past Surgical History:  Procedure Laterality Date   COLONOSCOPY WITH PROPOFOL N/A 10/25/2015   Procedure: COLONOSCOPY WITH PROPOFOL;  Surgeon: Lucilla Lame, MD;  Location: [place];  Service: Endoscopy;  Laterality: N/A;  DIABETIC-ORAL MEDS   COLONOSCOPY WITH PROPOFOL N/A 10/22/2020    Procedure: COLONOSCOPY CANCELED;  Surgeon: Lucilla Lame, MD;  Location: [place];  Service: Endoscopy;  Laterality: N/A;  procedure aborted poor prep   COLONOSCOPY WITH PROPOFOL N/A 10/25/2020   Procedure: COLONOSCOPY WITH PROPOFOL;  Surgeon: Lucilla Lame, MD;  Location: [place];  Service: Endoscopy;  Laterality: N/A;   CORONARY ARTERY BYPASS GRAFT N/A 04/10/2017   Procedure: CORONARY ARTERY BYPASS GRAFTING (CABG) x three , using left internal mammary artery and right leg greater saphenous vein harvested endoscopically;  Surgeon: Ivin Poot, MD;  Location: [place];  Service: Open Heart Surgery;  Laterality: N/A;   CORONARY ARTERY BYPASS GRAFT     CORONARY STENT INTERVENTION N/A 06/19/2017   Procedure: Coronary Stent Intervention;  Surgeon: Jettie Booze, MD;  Location: [place] [facility];  Service: Cardiovascular;  Laterality: N/A;   ESOPHAGOGASTRODUODENOSCOPY (EGD) WITH PROPOFOL N/A 10/22/2020   Procedure: ESOPHAGOGASTRODUODENOSCOPY (EGD) WITH PROPOFOL;  Surgeon: Lucilla Lame, MD;  Location: [place];  Service: Endoscopy;  Laterality: N/A;  Diabetic - oral meds   KNEE SURGERY Right    over 20 years ago   LEFT HEART CATH AND CORONARY ANGIOGRAPHY Left 02/22/2017   Procedure: Left Heart Cath and Coronary Angiography;  Surgeon: Corey Skains, MD;  Location: [place] [facility];  Service: Cardiovascular;  Laterality: Left;   LEFT HEART CATH AND CORS/GRAFTS ANGIOGRAPHY N/A 06/18/2017   Procedure: Left Heart Cath and Cors/Grafts Angiography and PCI;  Surgeon: Isaias Cowman, MD;  Location: [place] [facility];  Service: Cardiovascular;  Laterality: N/A;   POLYPECTOMY  10/25/2015   Procedure: POLYPECTOMY;  Surgeon: Lucilla Lame, MD;  Location: [place];  Service: Endoscopy;;   ROTATOR CUFF REPAIR Right 2012   TEE WITHOUT CARDIOVERSION N/A 04/10/2017   Procedure: TRANSESOPHAGEAL ECHOCARDIOGRAM (TEE);  Surgeon: Ivin Poot, MD;   Location: [place];  Service: Open Heart Surgery;  Laterality: N/A;    SOCIAL HISTORY: Social History   Socioeconomic History   Marital status: Married    Spouse name: Not on file   Number of children: Not on file   Years of education: Not on file   Highest education level: Not on file  Occupational History   Not on file  Tobacco Use   Smoking status: Former Smoker    Packs/day: 2.00    Years: 15.00    Pack years: 30.00    Types: Cigarettes    Quit date: 06/30/1979    Years since quitting: 41.4   Smokeless tobacco: Never Used  Vaping Use   Vaping Use: Never used  Substance and Sexual Activity   Alcohol use: No    Alcohol/week: 0.0 standard drinks    Comment: QUIT IN 1980   Drug use: No   Sexual activity: Not on file  Other Topics Concern   Not on file  Social History Narrative   Not on file   Social Determinants of Health   Financial Resource Strain:    Difficulty of Paying Living Expenses: Not on file  Food Insecurity:    Worried About Running  Out of Food in the Last Year: Not on file   Ran Out of Food in the Last Year: Not on file  Transportation Needs:    Lack of Transportation (Medical): Not on file   Lack of Transportation (Non-Medical): Not on file  Physical Activity:    Days of Exercise per Week: Not on file   Minutes of Exercise per Session: Not on file  Stress:    Feeling of Stress : Not on file  Social Connections:    Frequency of Communication with Friends and Family: Not on file   Frequency of Social Gatherings with Friends and Family: Not on file   Attends Religious Services: Not on file   Active Member of Clubs or Organizations: Not on file   Attends Archivist Meetings: Not on file   Marital Status: Not on file  Intimate Partner Violence:    Fear of Current or Ex-Partner: Not on file   Emotionally Abused: Not on file   Physically Abused: Not on file   Sexually Abused: Not on file    FAMILY  HISTORY: Family History  Problem Relation Age of Onset   Cancer Father    Heart disease Father    Heart attack Father    Cancer Mother    Heart disease Brother    Diabetes Brother    Prostate cancer Brother    Diabetes Sister    Bladder Cancer Neg Hx    Kidney disease Neg Hx     ALLERGIES:  is allergic to blood-group specific substance and cortisone acetate [cortisone].  MEDICATIONS:  Current Outpatient Medications  Medication Sig Dispense Refill   ACCU-CHEK AVIVA PLUS test strip      Accu-Chek Softclix Lancets lancets      acetaminophen (TYLENOL) 500 MG tablet Take 2 tablets (1,000 mg total) by mouth every 6 (six) hours as needed. (Patient taking differently: Take 1,000 mg by mouth every 6 (six) hours as needed for mild pain, fever or headache. ) 30 tablet 0   albuterol (PROVENTIL HFA;VENTOLIN HFA) 108 (90 Base) MCG/ACT inhaler Inhale into the lungs.     Alcohol Swabs (B-D SINGLE USE SWABS REGULAR) PADS      amLODipine (NORVASC) 5 MG tablet TAKE 1 TABLET EVERY DAY 90 tablet 2   aspirin 81 MG tablet Take 1 tablet (81 mg total) by mouth daily. 90 tablet 1   atorvastatin (LIPITOR) 80 MG tablet TAKE 1 TABLET AT BEDTIME 90 tablet 3   carvedilol (COREG) 12.5 MG tablet TAKE 1 TABLET TWICE DAILY WITH A MEAL 180 tablet 2   clopidogrel (PLAVIX) 75 MG tablet TAKE 1 TABLET EVERY DAY 90 tablet 3   docusate sodium (COLACE) 100 MG capsule Take 100 mg by mouth 2 (two) times daily as needed.      FEROSUL 325 (65 Fe) MG tablet TAKE 1 TABLET EVERY DAY 90 tablet 0   fluticasone (FLONASE) 50 MCG/ACT nasal spray Place into the nose.     furosemide (LASIX) 20 MG tablet TAKE 1 TABLET EVERY DAY 90 tablet 3   gabapentin (NEURONTIN) 800 MG tablet Take 1,600 mg by mouth 3 (three) times daily.     lisinopril (ZESTRIL) 20 MG tablet TAKE 1 TABLET EVERY DAY 90 tablet 3   meclizine (ANTIVERT) 25 MG tablet TAKE 1 TABLET TWICE DAILY AS NEEDED FOR DIZZINESS 180 tablet 3   metFORMIN  (GLUCOPHAGE-XR) 500 MG 24 hr tablet Take 500 mg by mouth 2 (two) times daily.      oxyCODONE-acetaminophen (PERCOCET)  10-325 MG tablet Take 1 tablet by mouth every 4 (four) hours as needed for pain. 40 tablet 0   pantoprazole (PROTONIX) 40 MG tablet TAKE 1 TABLET EVERY DAY 90 tablet 3   valACYclovir (VALTREX) 1000 MG tablet      vitamin B-12 (CYANOCOBALAMIN) 1000 MCG tablet      VITAMIN D-1000 MAX ST 25 MCG (1000 UT) tablet Take 1,000 Units by mouth daily.     nitroGLYCERIN (NITROSTAT) 0.4 MG SL tablet Place 1 tablet (0.4 mg total) under the tongue every 5 (five) minutes as needed for chest pain. (Patient not taking: Reported on 11/24/2020) 25 tablet 3   No current facility-administered medications for this visit.     PHYSICAL EXAMINATION: ECOG PERFORMANCE STATUS: 0 - Asymptomatic Vitals:   11/24/20 1304  BP: (!) 150/64  Pulse: 73  Resp: 18  Temp: 98.2 F (36.8 C)   Filed Weights   11/24/20 1304  Weight: 189 lb 3.2 oz (85.8 kg)    Physical Exam Constitutional:      General: He is not in acute distress. HENT:     Head: Normocephalic and atraumatic.  Eyes:     General: No scleral icterus.    Pupils: Pupils are equal, round, and reactive to light.  Cardiovascular:     Rate and Rhythm: Normal rate and regular rhythm.     Heart sounds: Normal heart sounds.  Pulmonary:     Effort: Pulmonary effort is normal. No respiratory distress.     Breath sounds: No wheezing.  Abdominal:     General: Bowel sounds are normal. There is no distension.     Palpations: Abdomen is soft. There is no mass.     Tenderness: There is no abdominal tenderness.  Musculoskeletal:        General: No deformity. Normal range of motion.     Cervical back: Normal range of motion and neck supple.  Skin:    General: Skin is warm and dry.     Findings: No erythema or rash.  Neurological:     Mental Status: He is alert and oriented to person, place, and time. Mental status is at baseline.     Cranial  Nerves: No cranial nerve deficit.     Coordination: Coordination normal.  Psychiatric:        Mood and Affect: Mood normal.      LABORATORY DATA:  I have reviewed the data as listed Lab Results  Component Value Date   WBC 8.2 11/22/2020   HGB 12.1 (L) 11/22/2020   HCT 35.3 (L) 11/22/2020   MCV 84.4 11/22/2020   PLT 192 11/22/2020   No results for input(s): NA, K, CL, CO2, GLUCOSE, BUN, CREATININE, CALCIUM, GFRNONAA, GFRAA, PROT, ALBUMIN, AST, ALT, ALKPHOS, BILITOT, BILIDIR, IBILI in the last 8760 hours. Iron/TIBC/Ferritin/ %Sat    Component Value Date/Time   IRON 47 11/22/2020 1255   TIBC 314 11/22/2020 1255   FERRITIN 35 11/22/2020 1255   IRONPCTSAT 15 (L) 11/22/2020 1255     RADIOGRAPHIC STUDIES: I have personally reviewed the radiological images as listed and agreed with the findings in the report. No results found.    ASSESSMENT & PLAN:  1. Iron deficiency anemia, unspecified iron deficiency anemia type    #Iron deficiency anemia  Labs reviewed and discussed with patient. Hemoglobin remained stable. Iron panel showed slightly decreased iron saturation at 15, ferritin 35. No need for additional IV Venofer at this point. I recommend patient to take over-the-counter ferrous sulfate 325 mg  daily.  I recommend patient to follow-up in 1 year.  He may also discuss in follow-up with primary care provider in 6 months.  He may return to primary care provider for future follow-up of blood work.    Orders Placed This Encounter  Procedures   CBC with Differential/Platelet    Standing Status:   Future    Standing Expiration Date:   11/24/2021   Ferritin    Standing Status:   Future    Standing Expiration Date:   11/24/2021   Iron and TIBC    Standing Status:   Future    Standing Expiration Date:   11/24/2021    All questions were answered. The patient knows to call the clinic with any problems questions or concerns.  Return of visit: 12 months Earlie Server, MD,  PhD 11/24/2020

## 2020-12-15 ENCOUNTER — Other Ambulatory Visit: Payer: Self-pay | Admitting: Cardiovascular Disease

## 2020-12-20 ENCOUNTER — Ambulatory Visit: Payer: Medicare PPO | Admitting: Podiatry

## 2021-01-17 ENCOUNTER — Encounter: Payer: Self-pay | Admitting: Cardiovascular Disease

## 2021-01-19 ENCOUNTER — Ambulatory Visit: Payer: Medicare HMO | Admitting: Orthotics

## 2021-01-19 ENCOUNTER — Other Ambulatory Visit: Payer: Self-pay

## 2021-01-19 DIAGNOSIS — M2041 Other hammer toe(s) (acquired), right foot: Secondary | ICD-10-CM

## 2021-01-19 DIAGNOSIS — E1142 Type 2 diabetes mellitus with diabetic polyneuropathy: Secondary | ICD-10-CM

## 2021-01-19 DIAGNOSIS — M2042 Other hammer toe(s) (acquired), left foot: Secondary | ICD-10-CM

## 2021-01-19 DIAGNOSIS — Z89422 Acquired absence of other left toe(s): Secondary | ICD-10-CM

## 2021-01-19 NOTE — Progress Notes (Signed)

## 2021-01-21 ENCOUNTER — Other Ambulatory Visit: Payer: Self-pay | Admitting: Cardiovascular Disease

## 2021-01-21 NOTE — Telephone Encounter (Signed)
Rx request sent to pharmacy.  

## 2021-02-23 ENCOUNTER — Other Ambulatory Visit: Payer: Self-pay

## 2021-02-23 ENCOUNTER — Other Ambulatory Visit: Payer: Medicare HMO

## 2021-02-23 DIAGNOSIS — M2011 Hallux valgus (acquired), right foot: Secondary | ICD-10-CM | POA: Diagnosis not present

## 2021-02-23 DIAGNOSIS — M2042 Other hammer toe(s) (acquired), left foot: Secondary | ICD-10-CM | POA: Diagnosis not present

## 2021-02-23 DIAGNOSIS — M2041 Other hammer toe(s) (acquired), right foot: Secondary | ICD-10-CM | POA: Diagnosis not present

## 2021-02-23 DIAGNOSIS — E119 Type 2 diabetes mellitus without complications: Secondary | ICD-10-CM | POA: Diagnosis not present

## 2021-02-23 DIAGNOSIS — Z89422 Acquired absence of other left toe(s): Secondary | ICD-10-CM | POA: Diagnosis not present

## 2021-05-10 ENCOUNTER — Other Ambulatory Visit: Payer: Self-pay

## 2021-05-10 ENCOUNTER — Ambulatory Visit: Payer: Medicare HMO | Admitting: Cardiovascular Disease

## 2021-05-10 ENCOUNTER — Other Ambulatory Visit: Payer: Self-pay | Admitting: Cardiovascular Disease

## 2021-05-10 ENCOUNTER — Encounter: Payer: Self-pay | Admitting: Cardiovascular Disease

## 2021-05-10 VITALS — BP 150/60 | HR 65 | Ht 72.0 in | Wt 194.5 lb

## 2021-05-10 DIAGNOSIS — E1159 Type 2 diabetes mellitus with other circulatory complications: Secondary | ICD-10-CM | POA: Diagnosis not present

## 2021-05-10 DIAGNOSIS — Z951 Presence of aortocoronary bypass graft: Secondary | ICD-10-CM | POA: Diagnosis not present

## 2021-05-10 DIAGNOSIS — E785 Hyperlipidemia, unspecified: Secondary | ICD-10-CM

## 2021-05-10 DIAGNOSIS — I739 Peripheral vascular disease, unspecified: Secondary | ICD-10-CM | POA: Diagnosis not present

## 2021-05-10 DIAGNOSIS — I10 Essential (primary) hypertension: Secondary | ICD-10-CM

## 2021-05-10 DIAGNOSIS — I25708 Atherosclerosis of coronary artery bypass graft(s), unspecified, with other forms of angina pectoris: Secondary | ICD-10-CM | POA: Diagnosis not present

## 2021-05-10 NOTE — Progress Notes (Signed)
Date:  05/10/2021   ID:  Kenneth Duarte, DOB [birth year], MRN [id number]  Patient Location:  [place] [id number]   Provider location:   [facility], [place] office  PCP:  Ellamae Sia, MD  Cardiologist:  Patsy Baltimore   Chief Complaint  Patient presents with  . 12 month follow up     "doing well." Medications reviewed by the patient verbally.      History of Present Illness:    Kenneth Duarte is a 75 y.o. male  past medical history of Smoker quit 1980 CAD, CABG  in 03/2017.  3V CABG with LIMA to LAD, SVG to OM, and SVG to PDA by Dr. Nils Pyle EF 50 to 55% by TEE Chest pain, transferred to [facility] Had stent placed to ostial vein graft to OM July 2018, PROMUS PREM MR 7.3X10  Who presents for follow-up of his coronary disease, CABG  Last clinic visit with myself May 2021  Working hard , farming, growing produce, selling produce Active in yard, [employer], across the street from [employer]  BP elevated today, was rushing BP well controlled typically at home and with PMD yesterday  Often will cut back on lasix in the summer, has less fluid intake  No regular exercise program but active  Lab work reviewed HAB1C 6.7 hct 35  No angina, active  EKG personally reviewed by myself on todays visit NSR rate 65  BPM, nonspcific T wave  Other past medical history reviewed ST depression in lateral leads and elevated troponin @ 1.57--> 6.41. 3V CAD with 75% occl LIMA-->LAD near anastomotic site, occl SVG--> PDA and 95% occl SVG--> OM1 and 95% occl of native vessel distal to anastomotic site.  Cardiac catheterization July 2018 1. Severe three-vessel coronary artery disease with heavily calcified, eccentric 50-60% stenosis distal left main, occluded proximal left circumflex, subtotal mid RCA. 2. Patent LIMA to mid LAD with discrete 75% stenosis near anastomotic site 3. Occluded SVG to PDA 4. Patent  SVG to OM1 with a long diffuse 95% stenosis proximal segment of graft, with near 60-70% stenosis at the anastomotic site, 95% stenosis in the native vessel distal to the anastomotic site, with retrograde filling of left circumflex 5. Mildly reduced left ventricular function, with inferior-posterior wall akinesis  Transferred to [facility] Had stent placed to ostial vein graft to OM Dist Graft lesion, 75 %stenosed. There is more disease noted past the graft insertion in the OM and in the distal circumflex.   Prior CV studies:   The following studies were reviewed today:    Past Medical History:  Diagnosis Date  . Arthritis   . Chronic pain syndrome   . Congestive heart failure ([place]) 1980  . Coronary artery disease    a. s/p 3V CABG (03/2017). b. NSTEMI 06/2017 with progressive graft disease, s/p DES to SVG-OM.  . Diabetes ([place])    DIET  . Dyspnea    with exertion  . ED (erectile dysfunction)   . GERD (gastroesophageal reflux disease)   . Gout   . Headache   . Hyperlipemia   . Hypertension    CONTROLLED ON MEDS  . IDA (iron deficiency anemia) 10/21/2019  . Neuropathy of both feet   . Seborrheic keratosis   . Sinus congestion   . Vertigo   . Wears dentures    PARTIAL UPPER   Past Surgical History:  Procedure Laterality Date  . COLONOSCOPY WITH  PROPOFOL N/A 10/25/2015   Procedure: COLONOSCOPY WITH PROPOFOL;  Surgeon: Lucilla Lame, MD;  Location: [place];  Service: Endoscopy;  Laterality: N/A;  DIABETIC-ORAL MEDS  . COLONOSCOPY WITH PROPOFOL N/A 10/22/2020   Procedure: COLONOSCOPY CANCELED;  Surgeon: Lucilla Lame, MD;  Location: [place];  Service: Endoscopy;  Laterality: N/A;  procedure aborted poor prep  . COLONOSCOPY WITH PROPOFOL N/A 10/25/2020   Procedure: COLONOSCOPY WITH PROPOFOL;  Surgeon: Lucilla Lame, MD;  Location: [place];  Service: Endoscopy;  Laterality: N/A;  . CORONARY ARTERY BYPASS GRAFT N/A 04/10/2017   Procedure: CORONARY ARTERY  BYPASS GRAFTING (CABG) x three , using left internal mammary artery and right leg greater saphenous vein harvested endoscopically;  Surgeon: Ivin Poot, MD;  Location: [place];  Service: Open Heart Surgery;  Laterality: N/A;  . CORONARY ARTERY BYPASS GRAFT    . CORONARY STENT INTERVENTION N/A 06/19/2017   Procedure: Coronary Stent Intervention;  Surgeon: Jettie Booze, MD;  Location: [place] [facility];  Service: Cardiovascular;  Laterality: N/A;  . ESOPHAGOGASTRODUODENOSCOPY (EGD) WITH PROPOFOL N/A 10/22/2020   Procedure: ESOPHAGOGASTRODUODENOSCOPY (EGD) WITH PROPOFOL;  Surgeon: Lucilla Lame, MD;  Location: [place];  Service: Endoscopy;  Laterality: N/A;  Diabetic - oral meds  . KNEE SURGERY Right    over 20 years ago  . LEFT HEART CATH AND CORONARY ANGIOGRAPHY Left 02/22/2017   Procedure: Left Heart Cath and Coronary Angiography;  Surgeon: Corey Skains, MD;  Location: [place] [facility];  Service: Cardiovascular;  Laterality: Left;  . LEFT HEART CATH AND CORS/GRAFTS ANGIOGRAPHY N/A 06/18/2017   Procedure: Left Heart Cath and Cors/Grafts Angiography and PCI;  Surgeon: Isaias Cowman, MD;  Location: [place] [facility];  Service: Cardiovascular;  Laterality: N/A;  . POLYPECTOMY  10/25/2015   Procedure: POLYPECTOMY;  Surgeon: Lucilla Lame, MD;  Location: [place];  Service: Endoscopy;;  . ROTATOR CUFF REPAIR Right 2012  . TEE WITHOUT CARDIOVERSION N/A 04/10/2017   Procedure: TRANSESOPHAGEAL ECHOCARDIOGRAM (TEE);  Surgeon: Ivin Poot, MD;  Location: [place];  Service: Open Heart Surgery;  Laterality: N/A;     Current Meds  Medication Sig  . ACCU-CHEK AVIVA PLUS test strip   . Accu-Chek Softclix Lancets lancets   . acetaminophen (TYLENOL) 500 MG tablet Take 2 tablets (1,000 mg total) by mouth every 6 (six) hours as needed. (Patient taking differently: Take 1,000 mg by mouth every 6 (six) hours as needed for mild pain, fever or headache.)  . albuterol  (PROVENTIL HFA;VENTOLIN HFA) 108 (90 Base) MCG/ACT inhaler Inhale into the lungs.  . Alcohol Swabs (B-D SINGLE USE SWABS REGULAR) PADS   . amLODipine-benazepril (LOTREL) 5-20 MG capsule Take 1 capsule by mouth daily.  Marland Kitchen aspirin 81 MG tablet Take 1 tablet (81 mg total) by mouth daily.  Marland Kitchen atorvastatin (LIPITOR) 80 MG tablet TAKE 1 TABLET AT BEDTIME  . carvedilol (COREG) 12.5 MG tablet TAKE 1 TABLET TWICE DAILY WITH A MEAL  . clopidogrel (PLAVIX) 75 MG tablet TAKE 1 TABLET EVERY DAY  . docusate sodium (COLACE) 100 MG capsule Take 100 mg by mouth 2 (two) times daily as needed.   . FEROSUL 325 (65 Fe) MG tablet TAKE 1 TABLET EVERY DAY  . fluticasone (FLONASE) 50 MCG/ACT nasal spray Place into the nose.  . furosemide (LASIX) 20 MG tablet TAKE 1 TABLET EVERY DAY  . gabapentin (NEURONTIN) 800 MG tablet Take 1,600 mg by mouth 3 (three) times daily.  Marland Kitchen lisinopril (ZESTRIL) 20  MG tablet TAKE 1 TABLET EVERY DAY  . meclizine (ANTIVERT) 25 MG tablet TAKE 1 TABLET TWICE DAILY AS NEEDED FOR DIZZINESS  . metFORMIN (GLUCOPHAGE-XR) 500 MG 24 hr tablet Take 500 mg by mouth 2 (two) times daily.   . nitroGLYCERIN (NITROSTAT) 0.4 MG SL tablet Place 1 tablet (0.4 mg total) under the tongue every 5 (five) minutes as needed for chest pain.  Marland Kitchen oxyCODONE-acetaminophen (PERCOCET) 10-325 MG tablet Take 1 tablet by mouth every 4 (four) hours as needed for pain.  . pantoprazole (PROTONIX) 40 MG tablet TAKE 1 TABLET EVERY DAY  . valACYclovir (VALTREX) 1000 MG tablet   . vitamin B-12 (CYANOCOBALAMIN) 1000 MCG tablet   . VITAMIN D-1000 MAX ST 25 MCG (1000 UT) tablet Take 1,000 Units by mouth daily.     Allergies:   Blood-group specific substance and Cortisone acetate [cortisone]   Social History   Tobacco Use  . Smoking status: Former Smoker    Packs/day: 2.00    Years: 15.00    Pack years: 30.00    Types: Cigarettes    Quit date: 06/30/1979    Years since quitting: 41.8  . Smokeless tobacco: Never Used  Vaping Use   . Vaping Use: Never used  Substance Use Topics  . Alcohol use: No    Alcohol/week: 0.0 standard drinks    Comment: QUIT IN 1980  . Drug use: No     Family Hx: The patient's family history includes Cancer in his father and mother; Diabetes in his brother and sister; Heart attack in his father; Heart disease in his brother and father; Prostate cancer in his brother. There is no history of Bladder Cancer or Kidney disease.  ROS:   Please see the history of present illness.    Review of Systems  Constitutional: Negative.   HENT: Negative.   Respiratory: Negative.   Cardiovascular: Negative.   Gastrointestinal: Negative.   Musculoskeletal: Negative.   Neurological: Negative.   Psychiatric/Behavioral: Negative.   All other systems reviewed and are negative.    Labs/Other Tests and Data Reviewed:    Recent Labs: 11/22/2020: Hemoglobin 12.1; Platelets 192   Recent Lipid Panel Lab Results  Component Value Date/Time   CHOL 91 06/18/2017 11:37 PM   TRIG 198 (H) 06/18/2017 11:37 PM   HDL 22 (L) 06/18/2017 11:37 PM   CHOLHDL 4.1 06/18/2017 11:37 PM   LDLCALC 29 06/18/2017 11:37 PM    Wt Readings from Last 3 Encounters:  05/10/21 194 lb 8 oz (88.2 kg)  11/24/20 189 lb 3.2 oz (85.8 kg)  10/25/20 184 lb 15.5 oz (83.9 kg)     Exam:    BP (!) 150/60 (BP Location: Left Arm, Patient Position: Sitting, Cuff Size: Normal)   Pulse 65   Ht 6' (1.829 m)   Wt 194 lb 8 oz (88.2 kg)   SpO2 98%   BMI 26.38 kg/m  Constitutional:  oriented to person, place, and time. No distress.  HENT:  Head: Grossly normal Eyes:  no discharge. No scleral icterus.  Neck: No JVD, no carotid bruits  Cardiovascular: Regular rate and rhythm, no murmurs appreciated Pulmonary/Chest: Clear to auscultation bilaterally, no wheezes or rails Abdominal: Soft.  no distension.  no tenderness.  Musculoskeletal: Normal range of motion Neurological:  normal muscle tone. Coordination normal. No atrophy Skin: Skin  warm and dry Psychiatric: normal affect, pleasant   ASSESSMENT & PLAN:    Coronary artery disease of bypass graft of native heart with stable angina pectoris ([place]) -  Plan: EKG 12-Lead Currently with no symptoms of angina. No further workup at this time. Continue current medication regimen.  Benign essential HTN Elevated, but better at home, 417 systolic  Type 2 diabetes mellitus with other circulatory complication, without long-term current use of insulin (HCC) Hemoglobin A1c 6.7 , recheck yesterday with PMD  Dyslipidemia Checked with PMD,  Goal LDL <70  S/P CABG x 3 -  Previously declined cardiac rehab No unstable anginal symptoms Non-smoker, diabetes well controlled, cholesterol in the past well controlled stable   Total encounter time more than 25 minutes  Greater than 50% was spent in counseling and coordination of care with the patient    Signed, Ida Rogue, MD  05/10/2021 8:43 AM    [place] Office [id number], [place], [place] [address]

## 2021-05-10 NOTE — Patient Instructions (Signed)
Medication Instructions:  Make sure you are not on lisinopril Take the amlodipine/benazelpril combo pill  If you need a refill on your cardiac medications before your next appointment, please call your pharmacy.    Lab work: No new labs needed   If you have labs (blood work) drawn today and your tests are completely normal, you will receive your results only by: Marland Kitchen MyChart Message (if you have MyChart) OR . A paper copy in the mail If you have any lab test that is abnormal or we need to change your treatment, we will call you to review the results.   Testing/Procedures: No new testing needed   Follow-Up: At [facility], you and your health needs are our priority.  As part of our continuing mission to provide you with exceptional heart care, we have created designated Provider Care Teams.  These Care Teams include your primary Cardiologist (physician) and Advanced Practice Providers (APPs -  Physician Assistants and Nurse Practitioners) who all work together to provide you with the care you need, when you need it.  . You will need a follow up appointment in 12 months  . Providers on your designated Care Team:   . Murray Hodgkins, NP . Christell Faith, PA-C . Marrianne Mood, PA-C  Any Other Special Instructions Will Be Listed Below (If Applicable).  COVID-19 Vaccine Information can be found at: [email or web address] For questions related to vaccine distribution or appointments, please email [email or web address] or call [phone].

## 2021-05-10 NOTE — Telephone Encounter (Signed)
Rx request sent to pharmacy.  

## 2021-06-08 ENCOUNTER — Other Ambulatory Visit: Payer: Self-pay | Admitting: Cardiovascular Disease

## 2021-09-08 ENCOUNTER — Other Ambulatory Visit (HOSPITAL_BASED_OUTPATIENT_CLINIC_OR_DEPARTMENT_OTHER): Payer: Self-pay | Admitting: Specialist

## 2021-09-08 ENCOUNTER — Other Ambulatory Visit: Payer: Self-pay | Admitting: Specialist

## 2021-09-08 DIAGNOSIS — R0600 Dyspnea, unspecified: Secondary | ICD-10-CM

## 2021-09-08 DIAGNOSIS — R0609 Other forms of dyspnea: Secondary | ICD-10-CM

## 2021-09-08 DIAGNOSIS — R918 Other nonspecific abnormal finding of lung field: Secondary | ICD-10-CM

## 2021-09-15 ENCOUNTER — Other Ambulatory Visit: Payer: Self-pay

## 2021-09-15 ENCOUNTER — Ambulatory Visit
Admission: RE | Admit: 2021-09-15 | Discharge: 2021-09-15 | Disposition: A | Discharge status: Home / Self Care | Payer: Medicare HMO | Source: Ambulatory Visit | Attending: Specialist | Admitting: Specialist

## 2021-09-15 DIAGNOSIS — R918 Other nonspecific abnormal finding of lung field: Secondary | ICD-10-CM | POA: Diagnosis present

## 2021-09-15 DIAGNOSIS — R0600 Dyspnea, unspecified: Secondary | ICD-10-CM | POA: Insufficient documentation

## 2021-09-15 DIAGNOSIS — R0609 Other forms of dyspnea: Secondary | ICD-10-CM

## 2021-10-10 ENCOUNTER — Other Ambulatory Visit: Payer: Self-pay | Admitting: Cardiovascular Disease

## 2021-11-21 ENCOUNTER — Inpatient Hospital Stay: Payer: Medicare HMO | Attending: Oncology

## 2021-11-23 ENCOUNTER — Inpatient Hospital Stay: Payer: Medicare HMO | Admitting: Oncology

## 2022-01-09 ENCOUNTER — Other Ambulatory Visit: Payer: Self-pay | Admitting: Cardiovascular Disease

## 2022-01-27 ENCOUNTER — Other Ambulatory Visit: Payer: Self-pay

## 2022-01-27 ENCOUNTER — Ambulatory Visit: Payer: Medicare HMO

## 2022-01-27 DIAGNOSIS — M2041 Other hammer toe(s) (acquired), right foot: Secondary | ICD-10-CM

## 2022-01-27 DIAGNOSIS — Z89422 Acquired absence of other left toe(s): Secondary | ICD-10-CM

## 2022-01-27 DIAGNOSIS — M2042 Other hammer toe(s) (acquired), left foot: Secondary | ICD-10-CM

## 2022-01-27 DIAGNOSIS — E1142 Type 2 diabetes mellitus with diabetic polyneuropathy: Secondary | ICD-10-CM

## 2022-01-27 DIAGNOSIS — M2032 Hallux varus (acquired), left foot: Secondary | ICD-10-CM

## 2022-01-27 NOTE — Progress Notes (Signed)
SITUATION Reason for Consult: Evaluation for Prefabricated Diabetic Shoes and Bilateral Custom Diabetic Inserts. Patient / Caregiver Report: Patient would like well fitting shoes  OBJECTIVE DATA: Patient History / Diagnosis:    ICD-10-CM   1. Diabetic polyneuropathy associated with type 2 diabetes mellitus (HCC)  E11.42     2. S/P amputation of lesser toe, left (HCC)  Z89.422     3. Hammer toes of both feet  M20.41    M20.42     4. Hallux malleus of left foot  M20.32       Current or Previous Devices:   NB 813 Lace 10.5D  In-Person Foot Examination: Ulcers & Callousing:   Historical  Toe / Foot Deformities:   - Hammertoes - Left 2nd digit amputation   Shoe Size: 10.5D  ORTHOTIC RECOMMENDATION Recommended Devices: - 1x pair prefabricated PDAC approved diabetic shoes: Patient selects NB 813 Lace White 10.5D - 3x pair custom-to-patient vacuum formed diabetic insoles.   GOALS OF SHOES AND INSOLES - Reduce shear and pressure - Reduce / Prevent callus formation - Reduce / Prevent ulceration - Protect the fragile healing compromised diabetic foot.  Patient would benefit from diabetic shoes and inserts as patient has diabetes mellitus and the patient has one or more of the following conditions: - History of partial or complete amputation of the foot - History of previous foot ulceration. - History of pre-ulcerative callus - Peripheral neuropathy with evidence of callus formation - Foot deformity - Poor circulation  ACTIONS PERFORMED Patient was casted for insoles via crush box and measured for shoes via brannock device. Procedure was explained and patient tolerated procedure well. All questions were answered and concerns addressed.  PLAN Patient is be contacted to reach out to referring provider. Patient has been historically a patient of [facility] but is currently seeing a pedorthist outside the office. As per policy, in order to facilitate shoe and insert  orders, patient must be seen by a [facility] practitioner. Administration to contact patient and determine how to proceed. Casts and shoe order are to be held until paperwork is received. Once received patient is to be scheduled for fitting in four weeks.

## 2022-02-03 ENCOUNTER — Telehealth: Payer: Self-pay

## 2022-02-03 NOTE — Telephone Encounter (Signed)
Casts sent to central fabrication - HOLD FOR AUTHORIZATION

## 2022-03-17 ENCOUNTER — Telehealth: Payer: Self-pay

## 2022-03-17 NOTE — Telephone Encounter (Signed)
Shoes ordered - [employer] Men - Model [address] [place] [employer] - [phone] 10.5D ?

## 2022-04-28 ENCOUNTER — Telehealth: Payer: Self-pay

## 2022-04-28 NOTE — Telephone Encounter (Signed)
Called patient to let him know that the new balance shoes ordered are no longer available. Patient selected Apex X801M black lace-up sneakers. All questions answered and concerns addressed. New shoes ordered and patient to be informed when they arrive. ?

## 2022-05-05 ENCOUNTER — Ambulatory Visit (INDEPENDENT_AMBULATORY_CARE_PROVIDER_SITE_OTHER): Payer: Medicare HMO

## 2022-05-05 DIAGNOSIS — M2032 Hallux varus (acquired), left foot: Secondary | ICD-10-CM | POA: Diagnosis not present

## 2022-05-05 DIAGNOSIS — E1142 Type 2 diabetes mellitus with diabetic polyneuropathy: Secondary | ICD-10-CM

## 2022-05-05 DIAGNOSIS — M2041 Other hammer toe(s) (acquired), right foot: Secondary | ICD-10-CM | POA: Diagnosis not present

## 2022-05-05 DIAGNOSIS — M2042 Other hammer toe(s) (acquired), left foot: Secondary | ICD-10-CM | POA: Diagnosis not present

## 2022-05-05 DIAGNOSIS — Z89422 Acquired absence of other left toe(s): Secondary | ICD-10-CM | POA: Diagnosis not present

## 2022-05-05 NOTE — Progress Notes (Signed)
SITUATION Reason for Visit: Fitting of Diabetic Shoes & Insoles Patient / Caregiver Report:  Patient is satisfied with fit and function of shoes and insoles.  OBJECTIVE DATA: Patient History / Diagnosis:     ICD-10-CM   1. Diabetic polyneuropathy associated with type 2 diabetes mellitus (HCC)  E11.42     2. S/P amputation of lesser toe, left (HCC)  Z89.422     3. Hammer toes of both feet  M20.41    M20.42     4. Hallux malleus of left foot  M20.32       Change in Status:   None  ACTIONS PERFORMED: In-Person Delivery, patient was fit with: - 1x pair A5500 PDAC approved prefabricated Diabetic Shoes: Apex X801M 10.85M - 3x pair [id number] PDAC approved vacuum formed custom diabetic insoles; [employer]: [id number]  Shoes and insoles were verified for structural integrity and safety. Patient wore shoes and insoles in office. Skin was inspected and free of areas of concern after wearing shoes and inserts. Shoes and inserts fit properly. Patient / Caregiver provided with ferbal instruction and demonstration regarding donning, doffing, wear, care, proper fit, function, purpose, cleaning, and use of shoes and insoles ' and in all related precautions and risks and benefits regarding shoes and insoles. Patient / Caregiver was instructed to wear properly fitting socks with shoes at all times. Patient was also provided with verbal instruction regarding how to report any failures or malfunctions of shoes or inserts, and necessary follow up care. Patient / Caregiver was also instructed to contact physician regarding change in status that may affect function of shoes and inserts.   Patient / Caregiver verbalized undersatnding of instruction provided. Patient / Caregiver demonstrated independence with proper donning and doffing of shoes and inserts.  PLAN Patient to follow with treating physician as recommended. Plan of care was discussed with and agreed upon by patient and/or caregiver. All questions were  answered and concerns addressed.

## 2022-05-09 NOTE — Progress Notes (Unsigned)
Date:  05/10/2022   ID:  [name], DOB 09/24/1946, MRN [id number]  Patient Location:  [place] [address]   Provider location:   [facility], [place] office  PCP:  Boysie Coffin, MD  Cardiologist:  Patsy Baltimore   Chief Complaint  Patient presents with   12 month follow up     "Doing well." Medications reviewed by the patient verbally.     History of Present Illness:    Kenneth Duarte is a 76 y.o. male  past medical history of Smoker quit 1980 CAD, CABG  in 03/2017.  3V CABG with LIMA to LAD, SVG to OM, and SVG to PDA by Dr. 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  In follow-up today reports doing well, right elbow swollen has a wrap in place Reports that he drives down to [place] from the [employer] brings it back to sell locally  Not taking Lasix recently denies leg swelling abdominal bloating Often will cut back on lasix in the summer, has less fluid intake, out in the hot sun  No regular exercise program but active  No recent lab work available for review HAB1C 6.7 (old) hct 35 No recent lipids available   EKG personally reviewed by myself on todays visit NSR rate 66  BPM, no significant ST or T wave changes  Other past medical history reviewed ST depression in lateral leads and elevated troponin @ 1.57--> 6.41. 3V CAD with 75% occl LIMA--> LAD near anastomotic site, occl SVG--> PDA and 95% occl SVG--> OM1 and 95% occl of native vessel distal to anastomotic site.   Cardiac catheterization July 2018 1. Severe three-vessel coronary artery disease with  heavily calcified, eccentric 50-60% stenosis distal left main, occluded proximal left circumflex, subtotal mid RCA. 2. Patent LIMA to mid LAD with discrete 75% stenosis near anastomotic site 3. Occluded SVG to PDA 4. Patent SVG to OM1 with a long diffuse 95% stenosis proximal segment of graft, with near 60-70% stenosis at the anastomotic site, 95% stenosis in the native vessel distal to the anastomotic site, with retrograde filling of left circumflex 5. Mildly reduced left ventricular function, with inferior-posterior wall akinesis   Transferred to [facility] Had stent placed to ostial vein graft to OM Dist Graft lesion, 75 %stenosed. There is more disease noted past the graft insertion in the OM and in the distal circumflex.   Past Medical History:  Diagnosis Date   Arthritis    Chronic pain syndrome    Congestive heart failure ([place]) 1980   Coronary artery disease    a. s/p 3V CABG (03/2017). b. NSTEMI 06/2017 with progressive graft disease, s/p DES to SVG-OM.   Diabetes ([place])    DIET   Dyspnea    with exertion   ED (erectile dysfunction)    GERD (gastroesophageal reflux disease)    Gout    Headache    Hyperlipemia    Hypertension    CONTROLLED ON MEDS   IDA (iron deficiency anemia) 10/21/2019   Neuropathy of both feet    Seborrheic keratosis  Sinus congestion    Vertigo    Wears dentures    PARTIAL UPPER   Past Surgical History:  Procedure Laterality Date   COLONOSCOPY WITH PROPOFOL N/A 10/25/2015   Procedure: COLONOSCOPY WITH PROPOFOL;  Surgeon: Lucilla Lame, MD;  Location: [place];  Service: Endoscopy;  Laterality: N/A;  DIABETIC-ORAL MEDS   COLONOSCOPY WITH PROPOFOL N/A 10/22/2020   Procedure: COLONOSCOPY CANCELED;  Surgeon: Lucilla Lame, MD;  Location: [place];  Service: Endoscopy;  Laterality: N/A;  procedure aborted poor prep   COLONOSCOPY WITH PROPOFOL N/A 10/25/2020   Procedure: COLONOSCOPY WITH PROPOFOL;  Surgeon: Lucilla Lame, MD;  Location: [place];  Service: Endoscopy;  Laterality: N/A;   CORONARY ARTERY BYPASS GRAFT N/A 04/10/2017   Procedure: CORONARY ARTERY BYPASS GRAFTING (CABG) x three , using left internal mammary artery and right leg greater saphenous vein harvested endoscopically;  Surgeon: Ivin Poot, MD;  Location: [place];  Service: Open Heart Surgery;  Laterality: N/A;   CORONARY ARTERY BYPASS GRAFT     CORONARY STENT INTERVENTION N/A 06/19/2017   Procedure: Coronary Stent Intervention;  Surgeon: Jettie Booze, MD;  Location: [place] [facility];  Service: Cardiovascular;  Laterality: N/A;   ESOPHAGOGASTRODUODENOSCOPY (EGD) WITH PROPOFOL N/A 10/22/2020   Procedure: ESOPHAGOGASTRODUODENOSCOPY (EGD) WITH PROPOFOL;  Surgeon: Lucilla Lame, MD;  Location: [place];  Service: Endoscopy;  Laterality: N/A;  Diabetic - oral meds   KNEE SURGERY Right    over 20 years ago   LEFT HEART CATH AND CORONARY ANGIOGRAPHY Left 02/22/2017   Procedure: Left Heart Cath and Coronary Angiography;  Surgeon: Corey Skains, MD;  Location: [place] [facility];  Service: Cardiovascular;  Laterality: Left;   LEFT HEART CATH AND CORS/GRAFTS ANGIOGRAPHY N/A 06/18/2017   Procedure: Left Heart Cath and Cors/Grafts Angiography and PCI;  Surgeon: Isaias Cowman, MD;  Location: [place] [facility];  Service: Cardiovascular;  Laterality: N/A;   POLYPECTOMY  10/25/2015   Procedure: POLYPECTOMY;  Surgeon: Lucilla Lame, MD;  Location: [place];  Service: Endoscopy;;   ROTATOR CUFF REPAIR Right 2012   TEE WITHOUT CARDIOVERSION N/A 04/10/2017   Procedure: TRANSESOPHAGEAL ECHOCARDIOGRAM (TEE);  Surgeon: Ivin Poot, MD;  Location: [place];  Service: Open Heart Surgery;  Laterality: N/A;     Current Meds  Medication Sig   albuterol (PROVENTIL HFA;VENTOLIN HFA) 108 (90 Base) MCG/ACT inhaler Inhale into the lungs.   Alcohol Swabs (B-D SINGLE USE SWABS REGULAR) PADS    amLODipine-benazepril (LOTREL) 5-20 MG capsule Take 1  capsule by mouth daily.   aspirin 81 MG tablet Take 1 tablet (81 mg total) by mouth daily.   atorvastatin (LIPITOR) 80 MG tablet TAKE 1 TABLET AT BEDTIME   carvedilol (COREG) 12.5 MG tablet TAKE 1 TABLET TWICE DAILY WITH A MEAL   clopidogrel (PLAVIX) 75 MG tablet Take 1 tablet (75 mg total) by mouth daily. PLEASE CALL OFFICE TO SCHEDULE YEARLY FOLLOW UP.   docusate sodium (COLACE) 100 MG capsule Take 100 mg by mouth 2 (two) times daily as needed.    FEROSUL 325 (65 Fe) MG tablet TAKE 1 TABLET EVERY DAY   fluticasone (FLONASE) 50 MCG/ACT nasal spray Place into the nose.   furosemide (LASIX) 20 MG tablet TAKE 1 TABLET EVERY DAY   gabapentin (NEURONTIN) 800 MG tablet Take 1,600 mg by mouth 3 (three) times daily.   meclizine (ANTIVERT) 25 MG tablet TAKE 1 TABLET TWICE DAILY AS NEEDED FOR DIZZINESS   metFORMIN (GLUCOPHAGE-XR)  500 MG 24 hr tablet Take 500 mg by mouth 2 (two) times daily.    NARCAN 4 MG/0.1ML LIQD nasal spray kit 1 spray once.   nitroGLYCERIN (NITROSTAT) 0.4 MG SL tablet Place 1 tablet (0.4 mg total) under the tongue every 5 (five) minutes as needed for chest pain.   oxyCODONE-acetaminophen (PERCOCET) 10-325 MG tablet Take 1 tablet by mouth every 4 (four) hours as needed for pain.   pantoprazole (PROTONIX) 40 MG tablet TAKE 1 TABLET EVERY DAY   valACYclovir (VALTREX) 1000 MG tablet Take 1,000 mg by mouth as needed.   vitamin B-12 (CYANOCOBALAMIN) 1000 MCG tablet    VITAMIN D-1000 MAX ST 25 MCG (1000 UT) tablet Take 1,000 Units by mouth daily.     Allergies:   Blood-group specific substance and Cortisone acetate [cortisone]   Social History   Tobacco Use   Smoking status: Former    Packs/day: 2.00    Years: 15.00    Pack years: 30.00    Types: Cigarettes    Quit date: 06/30/1979    Years since quitting: 42.8   Smokeless tobacco: Never  Vaping Use   Vaping Use: Never used  Substance Use Topics   Alcohol use: No    Alcohol/week: 0.0 standard drinks    Comment: QUIT IN  1980   Drug use: No     Family Hx: The patient's family history includes Cancer in his father and mother; Diabetes in his brother and sister; Heart attack in his father; Heart disease in his brother and father; Prostate cancer in his brother. There is no history of Bladder Cancer or Kidney disease.  ROS:   Please see the history of present illness.    Review of Systems  Constitutional: Negative.   HENT: Negative.    Respiratory: Negative.    Cardiovascular: Negative.   Gastrointestinal: Negative.   Musculoskeletal: Negative.   Neurological: Negative.   Psychiatric/Behavioral: Negative.    All other systems reviewed and are negative.   Labs/Other Tests and Data Reviewed:    Recent Labs: No results found for requested labs within last 8760 hours.   Recent Lipid Panel Lab Results  Component Value Date/Time   CHOL 91 06/18/2017 11:37 PM   TRIG 198 (H) 06/18/2017 11:37 PM   HDL 22 (L) 06/18/2017 11:37 PM   CHOLHDL 4.1 06/18/2017 11:37 PM   LDLCALC 29 06/18/2017 11:37 PM    Wt Readings from Last 3 Encounters:  05/10/22 189 lb 2 oz (85.8 kg)  05/10/21 194 lb 8 oz (88.2 kg)  11/24/20 189 lb 3.2 oz (85.8 kg)     Exam:    BP (!) 110/48 (BP Location: Left Arm, Patient Position: Sitting, Cuff Size: Normal)   Ht '5\' 11"'  (1.803 m)   Wt 189 lb 2 oz (85.8 kg)   SpO2 98%   BMI 26.38 kg/m  Constitutional:  oriented to person, place, and time. No distress.  HENT:  Head: Grossly normal Eyes:  no discharge. No scleral icterus.  Neck: No JVD, no carotid bruits  Cardiovascular: Regular rate and rhythm, no murmurs appreciated Pulmonary/Chest: Clear to auscultation bilaterally, no wheezes or rails Abdominal: Soft.  no distension.  no tenderness.  Musculoskeletal: Normal range of motion Neurological:  normal muscle tone. Coordination normal. No atrophy Skin: Skin warm and dry Psychiatric: normal affect, pleasant  ASSESSMENT & PLAN:    Coronary artery disease of bypass graft of  native heart with stable angina pectoris ([place]) - CABG 2018 Currently with no symptoms of angina.  No further workup at this time. Continue current medication regimen.  Benign essential HTN Blood pressure well controlled today, no changes made Recommend he try to stay hydrated For orthostasis symptoms recommend he call our office  Type 2 diabetes mellitus with other circulatory complication, without long-term current use of insulin ([place]) No recent lab work available,    Dyslipidemia Checked with PMD,  Goal LDL <70 No recent lab work available for review  S/P CABG x 3 -  Prior surgery 2018 Non-smoker, diabetes well controlled, cholesterol in the past well controlled Stable,    Total encounter time more than 30 minutes  Greater than 50% was spent in counseling and coordination of care with the patient    Signed, Ida Rogue, MD  05/10/2022 2:05 PM    [place] [facility] [id number], [place], [place] [address]

## 2022-05-10 ENCOUNTER — Ambulatory Visit: Payer: Medicare HMO | Admitting: Cardiovascular Disease

## 2022-05-10 ENCOUNTER — Encounter: Payer: Self-pay | Admitting: Cardiovascular Disease

## 2022-05-10 VITALS — BP 110/48 | Ht 71.0 in | Wt 189.1 lb

## 2022-05-10 DIAGNOSIS — I25708 Atherosclerosis of coronary artery bypass graft(s), unspecified, with other forms of angina pectoris: Secondary | ICD-10-CM

## 2022-05-10 DIAGNOSIS — Z951 Presence of aortocoronary bypass graft: Secondary | ICD-10-CM | POA: Diagnosis not present

## 2022-05-10 DIAGNOSIS — I6523 Occlusion and stenosis of bilateral carotid arteries: Secondary | ICD-10-CM

## 2022-05-10 DIAGNOSIS — I739 Peripheral vascular disease, unspecified: Secondary | ICD-10-CM

## 2022-05-10 DIAGNOSIS — E785 Hyperlipidemia, unspecified: Secondary | ICD-10-CM

## 2022-05-10 DIAGNOSIS — I10 Essential (primary) hypertension: Secondary | ICD-10-CM

## 2022-05-10 DIAGNOSIS — E1159 Type 2 diabetes mellitus with other circulatory complications: Secondary | ICD-10-CM | POA: Diagnosis not present

## 2022-05-10 NOTE — Patient Instructions (Signed)
We will request labs from [facility], lipids, A1C  Medication Instructions:  No changes  If you need a refill on your cardiac medications before your next appointment, please call your pharmacy.   Lab work: No new labs needed  Testing/Procedures: No new testing needed  Follow-Up: At [facility], you and your health needs are our priority.  As part of our continuing mission to provide you with exceptional heart care, we have created designated Provider Care Teams.  These Care Teams include your primary Cardiologist (physician) and Advanced Practice Providers (APPs -  Physician Assistants and Nurse Practitioners) who all work together to provide you with the care you need, when you need it.  You will need a follow up appointment in 12 months  Providers on your designated Care Team:   Murray Hodgkins, NP Christell Faith, PA-C Cadence Kathlen Mody, [place]  COVID-19 Vaccine Information can be found at: [email or web address] For questions related to vaccine distribution or appointments, please email [email or web address] or call [phone].

## 2022-05-17 ENCOUNTER — Other Ambulatory Visit: Payer: Self-pay | Admitting: Cardiovascular Disease

## 2022-06-27 ENCOUNTER — Other Ambulatory Visit: Payer: Self-pay | Admitting: Cardiovascular Disease

## 2022-08-03 IMAGING — CT CT CHEST W/O CM
1 series · 15 of 34 positions shown, 19 images · non-contrast
Comparison: 08/16/2020

CLINICAL DATA: Pulmonary nodule.

EXAM:
CT CHEST WITHOUT CONTRAST
TECHNIQUE: Multidetector CT imaging of the chest was performed following the
standard protocol without IV contrast.

[Series 2: thorax · axial · 0.84mm/px · z∈[-735,-425]mm · 15 of 183 slices shown, 19 images]
[im 14/183  mediastinal]
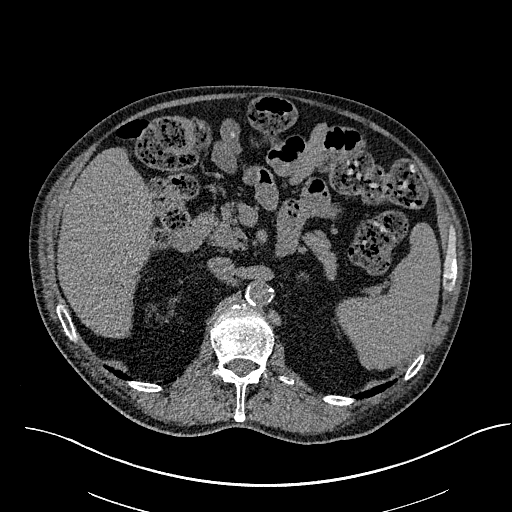
[im 14/183  lung]
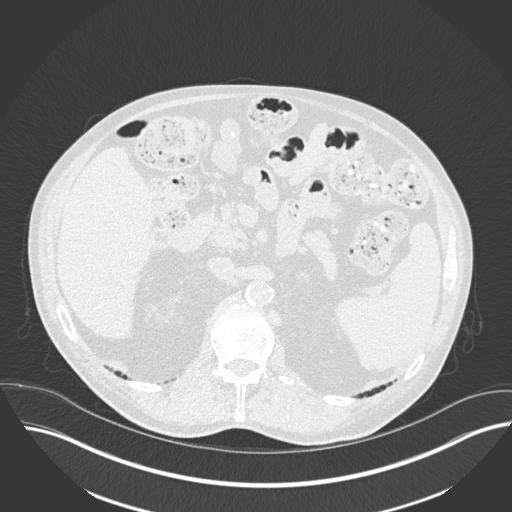
[im 27/183  lung]
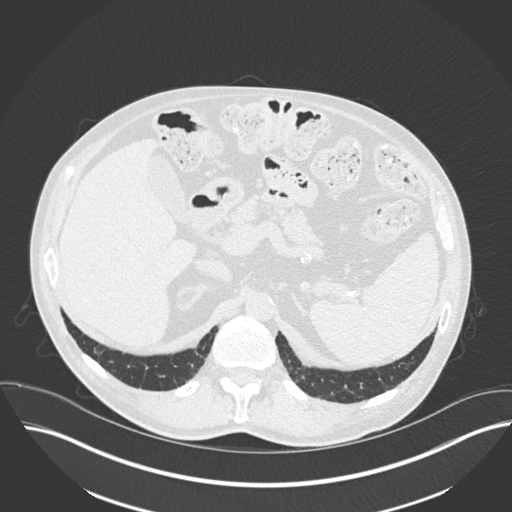
[im 37/183  lung]
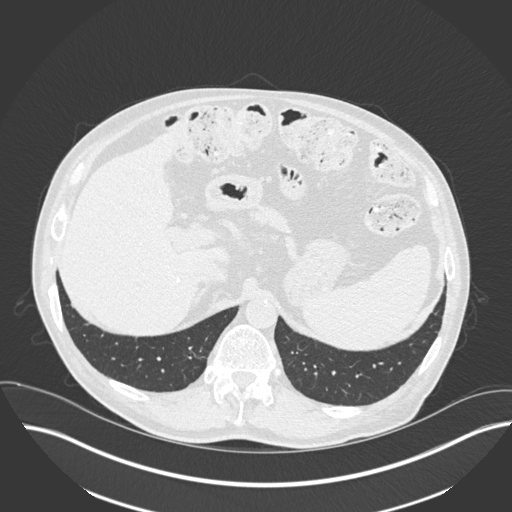
[im 48/183  lung]
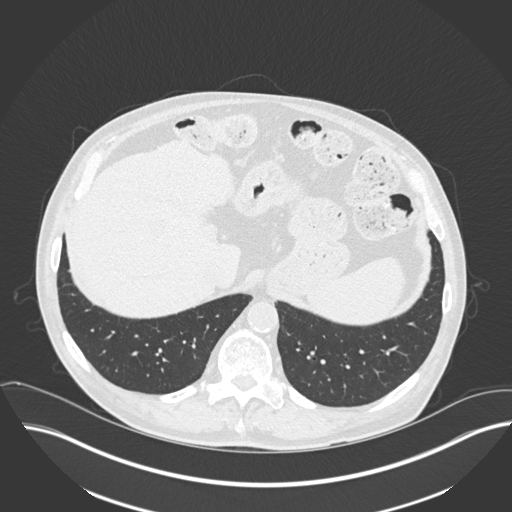
[im 61/183  mediastinal]
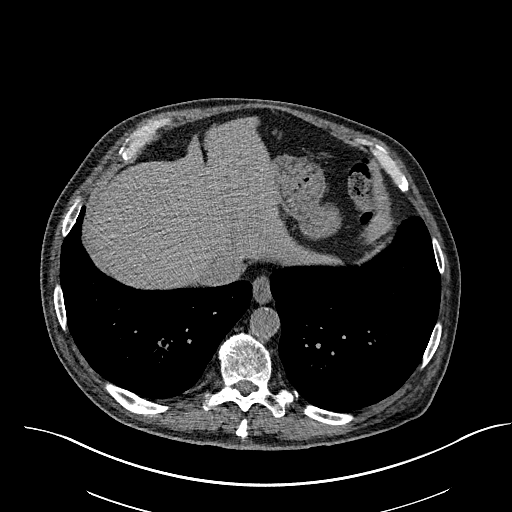
[im 61/183  lung]
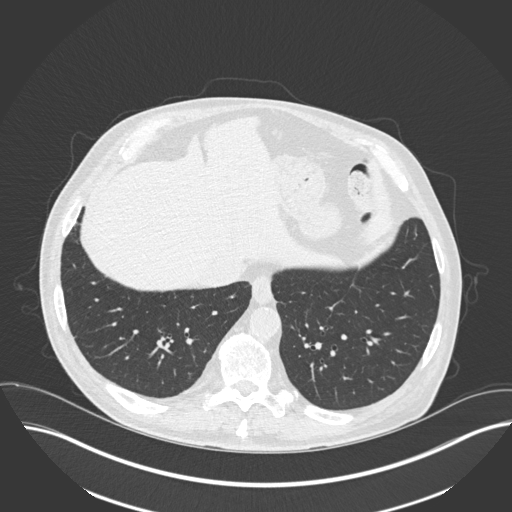
[im 73/183  lung]
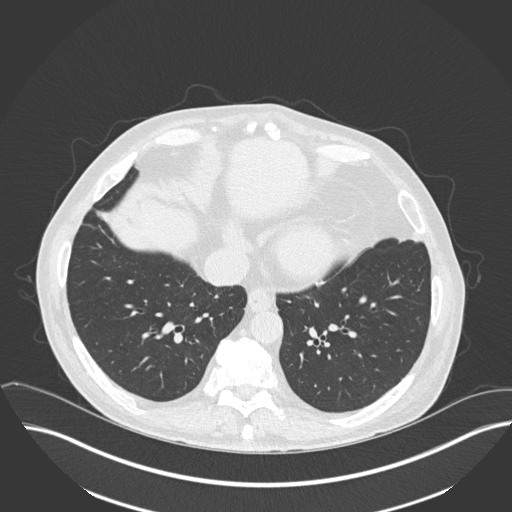
[im 81/183  lung]
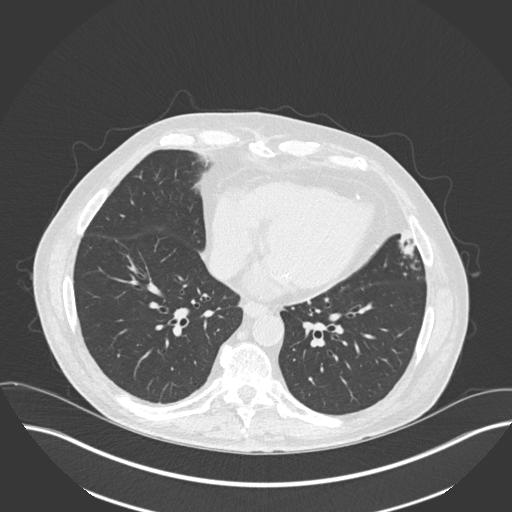
[im 95/183  lung]
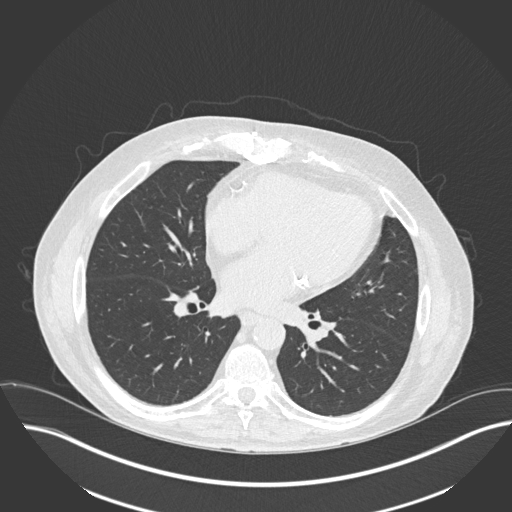
[im 102/183  mediastinal]
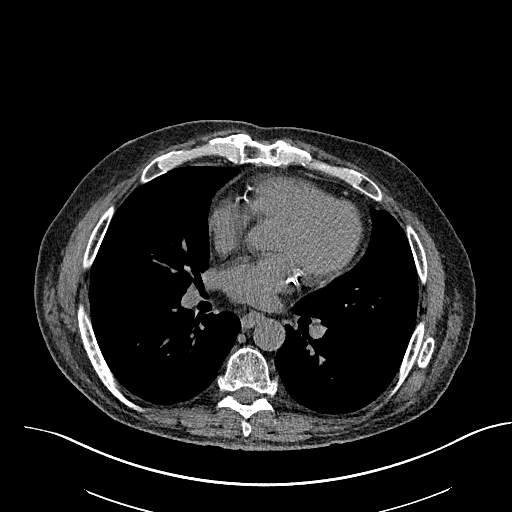
[im 102/183  lung]
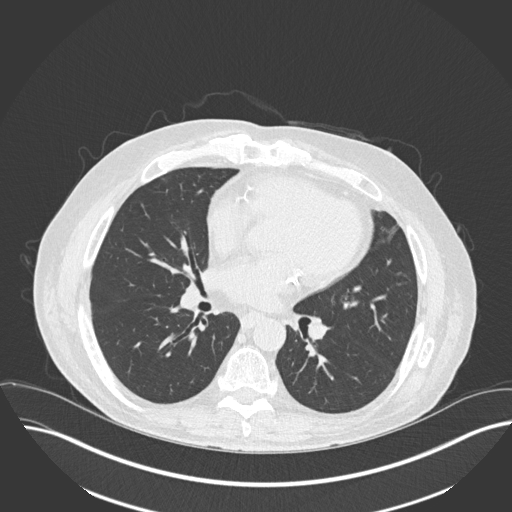
[im 110/183  lung]
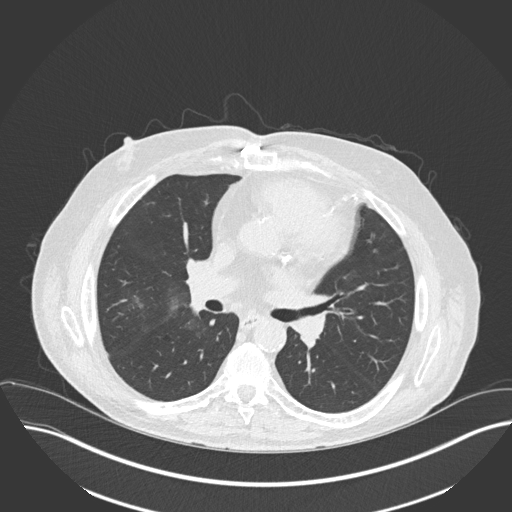
[im 122/183  lung]
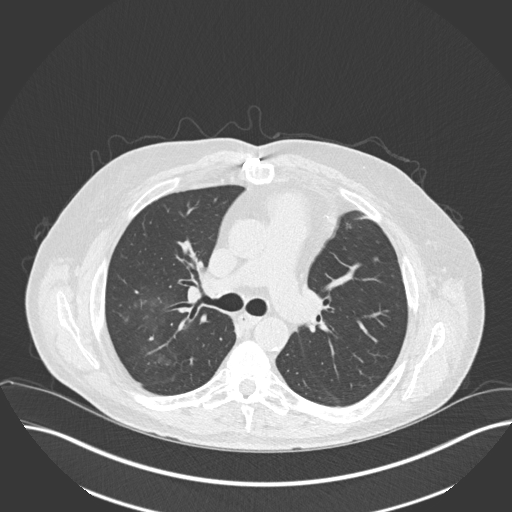
[im 135/183  lung]
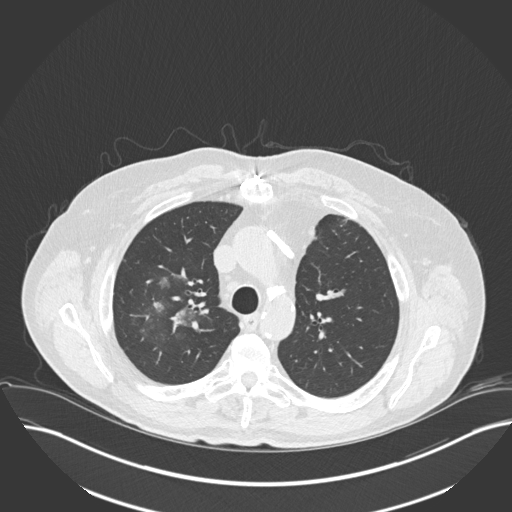
[im 146/183  mediastinal]
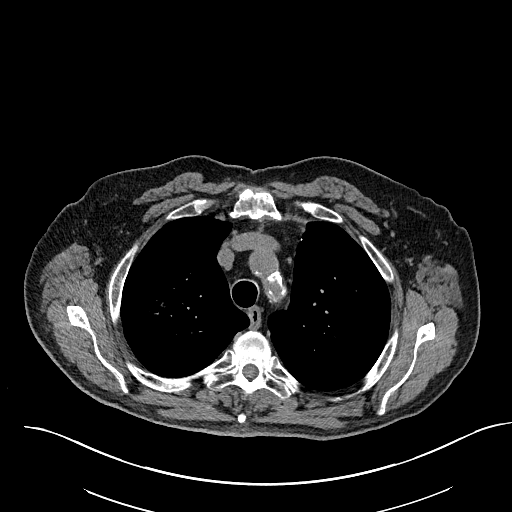
[im 146/183  lung]
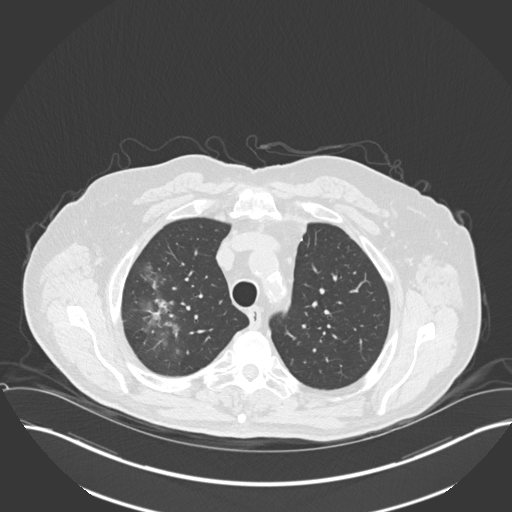
[im 156/183  lung]
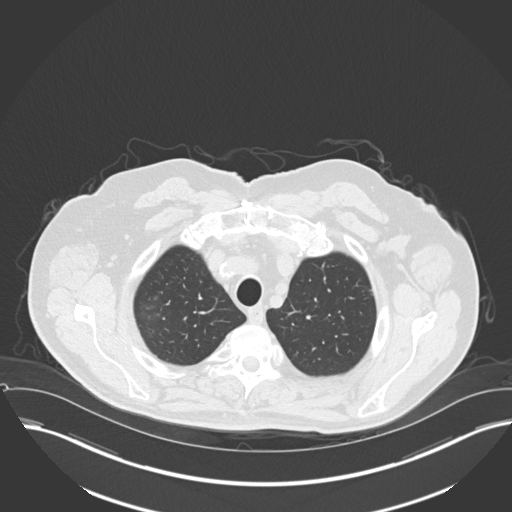
[im 169/183  lung]
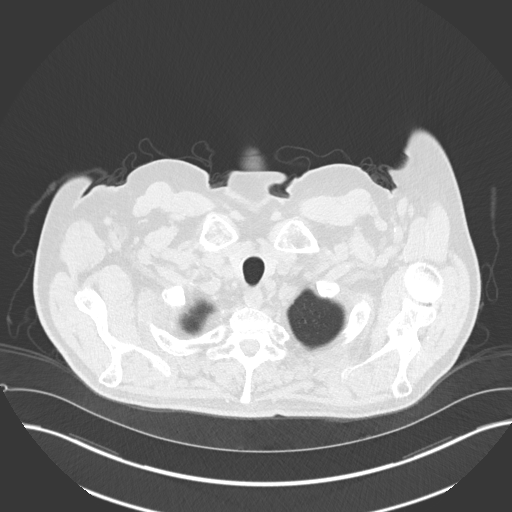

[15 of 34 positions shown; findings below may reference images not displayed]

FINDINGS: Cardiovascular: The heart size is normal. No substantial pericardial
effusion. Coronary artery calcification is evident. Status post
CABG.

Mediastinum/Nodes: No mediastinal lymphadenopathy. No evidence for
gross hilar lymphadenopathy although assessment is limited by the
lack of intravenous contrast on the current study. The esophagus has
normal imaging features. There is no axillary lymphadenopathy.

Lungs/Pleura: New ground-glass and ill-defined consolidative
opacities identified in the central right upper lobe (see image
43/3), highly suspicious for infectious/inflammatory etiology.

Tree-in-bud nodularity seen previously in the lingula has decreased
although there is some persistent peripheral tree-in-bud density on
today's exam ([DATE] mm right lower lobe nodule on 00/02/03 is stable.

Additional scattered tiny 3-5 mm pulmonary nodules are evident
bilaterally, stable in the interval suggesting benign etiology.

No pleural effusion.

Upper Abdomen: Unremarkable.

Musculoskeletal: No worrisome lytic or sclerotic osseous
abnormality.
IMPRESSION: 1. New ground-glass and ill-defined consolidative opacities in the
central right upper lobe, highly suspicious for
infectious/inflammatory etiology.
2. Tree-in-bud nodularity seen previously in the lingula has
decreased although there is some persistent peripheral tree-in-bud
density on today's exam. Features likely reflect sequelae of
atypical infection.
3. 8 mm right lower lobe pulmonary nodule is stable in the interval.
Additional scattered tiny 3-5 mm pulmonary nodules are stable in the
interval suggesting benign etiology.
4. Aortic Atherosclerosis ([id number]-[id number].M).

These results will be called to the ordering clinician or
representative by the Radiologist Assistant, and communication
documented in the PACS or [REDACTED].

## 2022-08-22 ENCOUNTER — Other Ambulatory Visit: Payer: Self-pay | Admitting: Cardiovascular Disease

## 2022-09-14 ENCOUNTER — Other Ambulatory Visit: Payer: Self-pay | Admitting: Specialist

## 2022-09-14 DIAGNOSIS — R918 Other nonspecific abnormal finding of lung field: Secondary | ICD-10-CM

## 2022-09-14 DIAGNOSIS — J849 Interstitial pulmonary disease, unspecified: Secondary | ICD-10-CM

## 2022-11-27 ENCOUNTER — Ambulatory Visit
Admission: RE | Admit: 2022-11-27 | Discharge: 2022-11-27 | Disposition: A | Discharge status: Home / Self Care | Payer: Medicare HMO | Source: Ambulatory Visit | Attending: Specialist | Admitting: Specialist

## 2022-11-27 DIAGNOSIS — J849 Interstitial pulmonary disease, unspecified: Secondary | ICD-10-CM

## 2022-11-27 DIAGNOSIS — R918 Other nonspecific abnormal finding of lung field: Secondary | ICD-10-CM

## 2023-03-19 ENCOUNTER — Encounter: Payer: Self-pay | Admitting: Oncology

## 2023-03-20 ENCOUNTER — Other Ambulatory Visit: Payer: Self-pay

## 2023-03-20 ENCOUNTER — Emergency Department: Payer: Medicare HMO

## 2023-03-20 ENCOUNTER — Other Ambulatory Visit: Payer: Medicare HMO

## 2023-03-20 ENCOUNTER — Inpatient Hospital Stay
Admission: EM | Admit: 2023-03-20 | Discharge: 2023-03-23 | DRG: 240 | Disposition: A | Discharge status: Home / Self Care | Payer: Medicare HMO | Source: Ambulatory Visit | Attending: Internal Medicine | Admitting: Internal Medicine

## 2023-03-20 DIAGNOSIS — E11628 Type 2 diabetes mellitus with other skin complications: Secondary | ICD-10-CM | POA: Diagnosis not present

## 2023-03-20 DIAGNOSIS — Z7984 Long term (current) use of oral hypoglycemic drugs: Secondary | ICD-10-CM | POA: Diagnosis not present

## 2023-03-20 DIAGNOSIS — Z8042 Family history of malignant neoplasm of prostate: Secondary | ICD-10-CM

## 2023-03-20 DIAGNOSIS — M869 Osteomyelitis, unspecified: Secondary | ICD-10-CM | POA: Diagnosis present

## 2023-03-20 DIAGNOSIS — M1990 Unspecified osteoarthritis, unspecified site: Secondary | ICD-10-CM | POA: Diagnosis present

## 2023-03-20 DIAGNOSIS — L97516 Non-pressure chronic ulcer of other part of right foot with bone involvement without evidence of necrosis: Secondary | ICD-10-CM | POA: Diagnosis present

## 2023-03-20 DIAGNOSIS — E785 Hyperlipidemia, unspecified: Secondary | ICD-10-CM | POA: Diagnosis present

## 2023-03-20 DIAGNOSIS — N179 Acute kidney failure, unspecified: Secondary | ICD-10-CM | POA: Diagnosis not present

## 2023-03-20 DIAGNOSIS — M86171 Other acute osteomyelitis, right ankle and foot: Secondary | ICD-10-CM | POA: Diagnosis present

## 2023-03-20 DIAGNOSIS — I509 Heart failure, unspecified: Secondary | ICD-10-CM

## 2023-03-20 DIAGNOSIS — Z87891 Personal history of nicotine dependence: Secondary | ICD-10-CM

## 2023-03-20 DIAGNOSIS — L02611 Cutaneous abscess of right foot: Secondary | ICD-10-CM | POA: Diagnosis present

## 2023-03-20 DIAGNOSIS — I25810 Atherosclerosis of coronary artery bypass graft(s) without angina pectoris: Secondary | ICD-10-CM | POA: Diagnosis present

## 2023-03-20 DIAGNOSIS — D696 Thrombocytopenia, unspecified: Secondary | ICD-10-CM | POA: Diagnosis present

## 2023-03-20 DIAGNOSIS — Z888 Allergy status to other drugs, medicaments and biological substances status: Secondary | ICD-10-CM

## 2023-03-20 DIAGNOSIS — J449 Chronic obstructive pulmonary disease, unspecified: Secondary | ICD-10-CM | POA: Insufficient documentation

## 2023-03-20 DIAGNOSIS — M109 Gout, unspecified: Secondary | ICD-10-CM | POA: Diagnosis present

## 2023-03-20 DIAGNOSIS — Z7902 Long term (current) use of antithrombotics/antiplatelets: Secondary | ICD-10-CM

## 2023-03-20 DIAGNOSIS — L03115 Cellulitis of right lower limb: Secondary | ICD-10-CM | POA: Diagnosis present

## 2023-03-20 DIAGNOSIS — Z7982 Long term (current) use of aspirin: Secondary | ICD-10-CM

## 2023-03-20 DIAGNOSIS — I252 Old myocardial infarction: Secondary | ICD-10-CM

## 2023-03-20 DIAGNOSIS — I739 Peripheral vascular disease, unspecified: Secondary | ICD-10-CM | POA: Diagnosis not present

## 2023-03-20 DIAGNOSIS — Z8249 Family history of ischemic heart disease and other diseases of the circulatory system: Secondary | ICD-10-CM | POA: Diagnosis not present

## 2023-03-20 DIAGNOSIS — E1169 Type 2 diabetes mellitus with other specified complication: Secondary | ICD-10-CM | POA: Diagnosis present

## 2023-03-20 DIAGNOSIS — Z9861 Coronary angioplasty status: Secondary | ICD-10-CM | POA: Diagnosis not present

## 2023-03-20 DIAGNOSIS — E119 Type 2 diabetes mellitus without complications: Secondary | ICD-10-CM

## 2023-03-20 DIAGNOSIS — I701 Atherosclerosis of renal artery: Secondary | ICD-10-CM | POA: Diagnosis not present

## 2023-03-20 DIAGNOSIS — I10 Essential (primary) hypertension: Secondary | ICD-10-CM | POA: Diagnosis not present

## 2023-03-20 DIAGNOSIS — E1152 Type 2 diabetes mellitus with diabetic peripheral angiopathy with gangrene: Principal | ICD-10-CM | POA: Diagnosis present

## 2023-03-20 DIAGNOSIS — I2510 Atherosclerotic heart disease of native coronary artery without angina pectoris: Secondary | ICD-10-CM | POA: Diagnosis not present

## 2023-03-20 DIAGNOSIS — I7025 Atherosclerosis of native arteries of other extremities with ulceration: Secondary | ICD-10-CM

## 2023-03-20 DIAGNOSIS — L089 Local infection of the skin and subcutaneous tissue, unspecified: Principal | ICD-10-CM

## 2023-03-20 DIAGNOSIS — E1142 Type 2 diabetes mellitus with diabetic polyneuropathy: Secondary | ICD-10-CM | POA: Diagnosis present

## 2023-03-20 DIAGNOSIS — G894 Chronic pain syndrome: Secondary | ICD-10-CM | POA: Diagnosis present

## 2023-03-20 DIAGNOSIS — I110 Hypertensive heart disease with heart failure: Secondary | ICD-10-CM | POA: Diagnosis present

## 2023-03-20 DIAGNOSIS — I70235 Atherosclerosis of native arteries of right leg with ulceration of other part of foot: Secondary | ICD-10-CM | POA: Diagnosis not present

## 2023-03-20 DIAGNOSIS — K219 Gastro-esophageal reflux disease without esophagitis: Secondary | ICD-10-CM | POA: Diagnosis present

## 2023-03-20 DIAGNOSIS — I5032 Chronic diastolic (congestive) heart failure: Secondary | ICD-10-CM | POA: Diagnosis present

## 2023-03-20 DIAGNOSIS — D509 Iron deficiency anemia, unspecified: Secondary | ICD-10-CM | POA: Diagnosis present

## 2023-03-20 DIAGNOSIS — L97519 Non-pressure chronic ulcer of other part of right foot with unspecified severity: Secondary | ICD-10-CM | POA: Diagnosis not present

## 2023-03-20 DIAGNOSIS — M79671 Pain in right foot: Secondary | ICD-10-CM | POA: Diagnosis present

## 2023-03-20 DIAGNOSIS — Z833 Family history of diabetes mellitus: Secondary | ICD-10-CM

## 2023-03-20 DIAGNOSIS — I708 Atherosclerosis of other arteries: Secondary | ICD-10-CM | POA: Diagnosis not present

## 2023-03-20 DIAGNOSIS — E11621 Type 2 diabetes mellitus with foot ulcer: Secondary | ICD-10-CM | POA: Diagnosis present

## 2023-03-20 DIAGNOSIS — E669 Obesity, unspecified: Secondary | ICD-10-CM | POA: Diagnosis present

## 2023-03-20 DIAGNOSIS — Z955 Presence of coronary angioplasty implant and graft: Secondary | ICD-10-CM

## 2023-03-20 DIAGNOSIS — Z79899 Other long term (current) drug therapy: Secondary | ICD-10-CM

## 2023-03-20 DIAGNOSIS — Z6827 Body mass index (BMI) 27.0-27.9, adult: Secondary | ICD-10-CM

## 2023-03-20 DIAGNOSIS — M86071 Acute hematogenous osteomyelitis, right ankle and foot: Secondary | ICD-10-CM | POA: Diagnosis not present

## 2023-03-20 LAB — SODIUM, URINE, RANDOM: Sodium, Ur: 25 mmol/L

## 2023-03-20 LAB — PREALBUMIN: Prealbumin: 13 mg/dL — ABNORMAL LOW (ref 18–38)

## 2023-03-20 LAB — COMPREHENSIVE METABOLIC PANEL
ALT: 17 U/L (ref 0–44)
AST: 17 U/L (ref 15–41)
Albumin: 3.4 g/dL — ABNORMAL LOW (ref 3.5–5.0)
Alkaline Phosphatase: 57 U/L (ref 38–126)
Anion gap: 9 (ref 5–15)
BUN: 34 mg/dL — ABNORMAL HIGH (ref 8–23)
CO2: 21 mmol/L — ABNORMAL LOW (ref 22–32)
Calcium: 7.8 mg/dL — ABNORMAL LOW (ref 8.9–10.3)
Chloride: 101 mmol/L (ref 98–111)
Creatinine, Ser: 2.38 mg/dL — ABNORMAL HIGH (ref 0.61–1.24)
GFR, Estimated: 28 mL/min — ABNORMAL LOW (ref >60)
Glucose, Bld: 159 mg/dL — ABNORMAL HIGH (ref 70–99)
Potassium: 4.1 mmol/L (ref 3.5–5.1)
Sodium: 131 mmol/L — ABNORMAL LOW (ref 135–145)
Total Bilirubin: 1.5 mg/dL — ABNORMAL HIGH (ref 0.3–1.2)
Total Protein: 6.6 g/dL (ref 6.5–8.1)

## 2023-03-20 LAB — CBC
HCT: 31.6 % — ABNORMAL LOW (ref 39.0–52.0)
Hemoglobin: 10.5 g/dL — ABNORMAL LOW (ref 13.0–17.0)
MCH: 29.2 pg (ref 26.0–34.0)
MCHC: 33.2 g/dL (ref 30.0–36.0)
MCV: 87.8 fL (ref 80.0–100.0)
Platelets: 130 10*3/uL — ABNORMAL LOW (ref 150–400)
RBC: 3.6 MIL/uL — ABNORMAL LOW (ref 4.22–5.81)
RDW: 14.7 % (ref 11.5–15.5)
WBC: 14.1 10*3/uL — ABNORMAL HIGH (ref 4.0–10.5)
nRBC: 0 % (ref 0.0–0.2)

## 2023-03-20 LAB — LACTIC ACID, PLASMA: Lactic Acid, Venous: 1.2 mmol/L (ref 0.5–1.9)

## 2023-03-20 LAB — GLUCOSE, CAPILLARY
Glucose-Capillary: 104 mg/dL — ABNORMAL HIGH (ref 70–99)
Glucose-Capillary: 175 mg/dL — ABNORMAL HIGH (ref 70–99)

## 2023-03-20 LAB — C-REACTIVE PROTEIN: CRP: 16.8 mg/dL — ABNORMAL HIGH (ref <1.0)

## 2023-03-20 LAB — CREATININE, URINE, RANDOM: Creatinine, Urine: 105 mg/dL

## 2023-03-20 LAB — SEDIMENTATION RATE: Sed Rate: 60 mm/hr — ABNORMAL HIGH (ref 0–20)

## 2023-03-20 MED ORDER — OXYCODONE-ACETAMINOPHEN 10-325 MG PO TABS: 1.0000 | ORAL_TABLET | ORAL | Status: DC | PRN

## 2023-03-20 MED ORDER — ONDANSETRON HCL 4 MG/2ML IJ SOLN
4.0000 mg | Freq: Four times a day (QID) | INTRAMUSCULAR | Status: DC | PRN
  Administered 2023-03-22: 4 mg via INTRAVENOUS
  Filled 2023-03-20: qty 2

## 2023-03-20 MED ORDER — VANCOMYCIN HCL 1750 MG/350ML IV SOLN
1750.0000 mg | Freq: Once | INTRAVENOUS | Status: AC
  Administered 2023-03-20: 1750 mg via INTRAVENOUS
  Filled 2023-03-20 (×2): qty 350

## 2023-03-20 MED ORDER — HYDROMORPHONE HCL 1 MG/ML IJ SOLN
2.0000 mg | INTRAMUSCULAR | Status: AC
  Administered 2023-03-20: 2 mg via INTRAVENOUS
  Filled 2023-03-20: qty 2

## 2023-03-20 MED ORDER — SODIUM CHLORIDE 0.9 % IV BOLUS
250.0000 mL | Freq: Once | INTRAVENOUS | Status: AC
  Administered 2023-03-20: 250 mL via INTRAVENOUS

## 2023-03-20 MED ORDER — HYDROMORPHONE HCL 1 MG/ML IJ SOLN
1.0000 mg | INTRAMUSCULAR | Status: DC | PRN
  Administered 2023-03-20 – 2023-03-22 (×4): 1 mg via INTRAVENOUS
  Filled 2023-03-20 (×5): qty 1

## 2023-03-20 MED ORDER — SODIUM CHLORIDE 0.9 % IV SOLN
2.0000 g | INTRAVENOUS | Status: DC
  Administered 2023-03-20 – 2023-03-22 (×3): 2 g via INTRAVENOUS
  Filled 2023-03-20 (×3): qty 20

## 2023-03-20 MED ORDER — ATORVASTATIN CALCIUM 20 MG PO TABS
80.0000 mg | ORAL_TABLET | Freq: Every day | ORAL | Status: DC
  Administered 2023-03-20 – 2023-03-22 (×3): 80 mg via ORAL
  Filled 2023-03-20 (×3): qty 4

## 2023-03-20 MED ORDER — ACETAMINOPHEN 325 MG PO TABS
325.0000 mg | ORAL_TABLET | ORAL | Status: DC | PRN
  Administered 2023-03-21 – 2023-03-23 (×8): 325 mg via ORAL
  Filled 2023-03-20 (×8): qty 1

## 2023-03-20 MED ORDER — INSULIN ASPART 100 UNIT/ML IJ SOLN: 0.0000 [IU] | Freq: Every day | INTRAMUSCULAR | Status: DC

## 2023-03-20 MED ORDER — VANCOMYCIN HCL 1750 MG/350ML IV SOLN: 1750.0000 mg | INTRAVENOUS | Status: DC

## 2023-03-20 MED ORDER — ONDANSETRON HCL 4 MG PO TABS: 4.0000 mg | ORAL_TABLET | Freq: Four times a day (QID) | ORAL | Status: DC | PRN

## 2023-03-20 MED ORDER — CLOPIDOGREL BISULFATE 75 MG PO TABS
75.0000 mg | ORAL_TABLET | Freq: Every day | ORAL | Status: DC
  Administered 2023-03-21 – 2023-03-23 (×3): 75 mg via ORAL
  Filled 2023-03-20 (×3): qty 1

## 2023-03-20 MED ORDER — SODIUM CHLORIDE 0.9 % IV SOLN: INTRAVENOUS | Status: DC

## 2023-03-20 MED ORDER — CARVEDILOL 12.5 MG PO TABS
12.5000 mg | ORAL_TABLET | Freq: Two times a day (BID) | ORAL | Status: DC
  Administered 2023-03-21 – 2023-03-23 (×5): 12.5 mg via ORAL
  Filled 2023-03-20 (×5): qty 1

## 2023-03-20 MED ORDER — INSULIN ASPART 100 UNIT/ML IJ SOLN
4.0000 [IU] | Freq: Three times a day (TID) | INTRAMUSCULAR | Status: DC
  Administered 2023-03-21 – 2023-03-23 (×5): 4 [IU] via SUBCUTANEOUS
  Filled 2023-03-20 (×5): qty 1

## 2023-03-20 MED ORDER — OXYCODONE HCL 5 MG PO TABS
10.0000 mg | ORAL_TABLET | ORAL | Status: DC | PRN
  Administered 2023-03-20 – 2023-03-23 (×14): 10 mg via ORAL
  Filled 2023-03-20 (×14): qty 2

## 2023-03-20 MED ORDER — ENOXAPARIN SODIUM 30 MG/0.3ML IJ SOSY
30.0000 mg | PREFILLED_SYRINGE | INTRAMUSCULAR | Status: DC
  Administered 2023-03-20 – 2023-03-21 (×2): 30 mg via SUBCUTANEOUS
  Filled 2023-03-20 (×2): qty 0.3

## 2023-03-20 MED ORDER — SODIUM CHLORIDE 0.9 % IV BOLUS
1000.0000 mL | Freq: Once | INTRAVENOUS | Status: AC
  Administered 2023-03-20: 1000 mL via INTRAVENOUS

## 2023-03-20 MED ORDER — ASPIRIN 81 MG PO TBEC
81.0000 mg | DELAYED_RELEASE_TABLET | Freq: Every day | ORAL | Status: DC
  Administered 2023-03-21 – 2023-03-23 (×3): 81 mg via ORAL
  Filled 2023-03-20 (×3): qty 1

## 2023-03-20 MED ORDER — INSULIN ASPART 100 UNIT/ML IJ SOLN
0.0000 [IU] | Freq: Three times a day (TID) | INTRAMUSCULAR | Status: DC
  Administered 2023-03-21 (×2): 2 [IU] via SUBCUTANEOUS
  Administered 2023-03-22 – 2023-03-23 (×2): 3 [IU] via SUBCUTANEOUS
  Filled 2023-03-20 (×3): qty 1

## 2023-03-20 MED ORDER — METRONIDAZOLE 500 MG/100ML IV SOLN
500.0000 mg | Freq: Two times a day (BID) | INTRAVENOUS | Status: DC
  Administered 2023-03-20 – 2023-03-23 (×6): 500 mg via INTRAVENOUS
  Filled 2023-03-20 (×6): qty 100

## 2023-03-20 MED ORDER — PANTOPRAZOLE SODIUM 40 MG PO TBEC
40.0000 mg | DELAYED_RELEASE_TABLET | Freq: Every day | ORAL | Status: DC
  Administered 2023-03-21 – 2023-03-23 (×3): 40 mg via ORAL
  Filled 2023-03-20 (×3): qty 1

## 2023-03-20 MED ORDER — PIPERACILLIN-TAZOBACTAM 3.375 G IVPB 30 MIN
3.3750 g | Freq: Once | INTRAVENOUS | Status: AC
  Administered 2023-03-20: 3.375 g via INTRAVENOUS
  Filled 2023-03-20: qty 50

## 2023-03-20 NOTE — Assessment & Plan Note (Signed)
Stable from respiratory standpoint Continue home inhalers 

## 2023-03-20 NOTE — Assessment & Plan Note (Addendum)
Coronary artery disease status post CABG as well as stenting No active chest pain Continue home regimen  Follow

## 2023-03-20 NOTE — Assessment & Plan Note (Signed)
BP stable Titrate home regimen 

## 2023-03-20 NOTE — H&P (Signed)
History and Physical    Patient: Kenneth Duarte [id number] DOB: [birth year] DOA: 03/20/2023 DOS: the patient was seen and examined on 03/20/2023 PCP: Bastian Coffin, MD  Patient coming from:  Podiatry office  Chief Complaint:  Chief Complaint  Patient presents with   Foot Pain   HPI: Emani Afable is a 77 y.o. male with medical history significant of CHF, coronary artery disease, type 2 diabetes, GERD, gout, hyperlipidemia, hypertension presenting with diabetic foot infection.  Patient reports worsening right foot lateral redness and pain since about June of last year.  Has been followed by Dr.Cline w/ podiatry outpatient.  Has had relatively stable course with good follow-up.  Patient reports worsening yellow discharge approximately 3 days ago.  No fevers or chills.  Positive redness around affected area.  Denies any trauma.  Has been wearing orthotic since last year.  Per the patient, blood sugars have been well-controlled in the 150s.  No alcohol or tobacco use.  No chest pain or shortness of breath.  No nausea or vomiting.  Was evaluated in podiatry clinic earlier today with noted worsening infection and necrotic tissue and as well as ulceration.  Patient was then sent to the ER for further evaluation. Presented to the ER afebrile, hemodynamically stable.  White count 14.1, hemoglobin 10.5, creatinine 2.38, CRP and sed rate are pending.  Right foot MRI with deep ulceration along the plantar aspect of the fifth metatarsal head and fifth MCP joint with underlying 2.5 cm abscess no evidence of osteomyelitis.  Started empiric antibiotics in the ER. Review of Systems: As mentioned in the history of present illness. All other systems reviewed and are negative. Past Medical History:  Diagnosis Date   Arthritis    Chronic pain syndrome    Congestive heart failure ([place]) 1980   Coronary artery disease    a. s/p 3V CABG (03/2017). b. NSTEMI 06/2017 with progressive graft disease, s/p DES to  SVG-OM.   Diabetes ([place])    DIET   Dyspnea    with exertion   ED (erectile dysfunction)    GERD (gastroesophageal reflux disease)    Gout    Headache    Hyperlipemia    Hypertension    CONTROLLED ON MEDS   IDA (iron deficiency anemia) 10/21/2019   Neuropathy of both feet    Seborrheic keratosis    Sinus congestion    Vertigo    Wears dentures    PARTIAL UPPER   Past Surgical History:  Procedure Laterality Date   COLONOSCOPY WITH PROPOFOL N/A 10/25/2015   Procedure: COLONOSCOPY WITH PROPOFOL;  Surgeon: Lucilla Lame, MD;  Location: [facility];  Service: Endoscopy;  Laterality: N/A;  DIABETIC-ORAL MEDS   COLONOSCOPY WITH PROPOFOL N/A 10/22/2020   Procedure: COLONOSCOPY CANCELED;  Surgeon: Lucilla Lame, MD;  Location: [facility];  Service: Endoscopy;  Laterality: N/A;  procedure aborted poor prep   COLONOSCOPY WITH PROPOFOL N/A 10/25/2020   Procedure: COLONOSCOPY WITH PROPOFOL;  Surgeon: Lucilla Lame, MD;  Location: [facility];  Service: Endoscopy;  Laterality: N/A;   CORONARY ARTERY BYPASS GRAFT N/A 04/10/2017   Procedure: CORONARY ARTERY BYPASS GRAFTING (CABG) x three , using left internal mammary artery and right leg greater saphenous vein harvested endoscopically;  Surgeon: Ivin Poot, MD;  Location: [facility];  Service: Open Heart Surgery;  Laterality: N/A;   CORONARY ARTERY BYPASS GRAFT     CORONARY STENT INTERVENTION N/A 06/19/2017   Procedure: Coronary Stent Intervention;  Surgeon: Jettie Booze,  MD;  Location: [facility];  Service: Cardiovascular;  Laterality: N/A;   ESOPHAGOGASTRODUODENOSCOPY (EGD) WITH PROPOFOL N/A 10/22/2020   Procedure: ESOPHAGOGASTRODUODENOSCOPY (EGD) WITH PROPOFOL;  Surgeon: Lucilla Lame, MD;  Location: [facility];  Service: Endoscopy;  Laterality: N/A;  Diabetic - oral meds   KNEE SURGERY Right    over 20 years ago   LEFT HEART CATH AND CORONARY ANGIOGRAPHY Left 02/22/2017   Procedure: Left Heart Cath and  Coronary Angiography;  Surgeon: Corey Skains, MD;  Location: [facility];  Service: Cardiovascular;  Laterality: Left;   LEFT HEART CATH AND CORS/GRAFTS ANGIOGRAPHY N/A 06/18/2017   Procedure: Left Heart Cath and Cors/Grafts Angiography and PCI;  Surgeon: Isaias Cowman, MD;  Location: [facility];  Service: Cardiovascular;  Laterality: N/A;   POLYPECTOMY  10/25/2015   Procedure: POLYPECTOMY;  Surgeon: Lucilla Lame, MD;  Location: [facility];  Service: Endoscopy;;   ROTATOR CUFF REPAIR Right 2012   TEE WITHOUT CARDIOVERSION N/A 04/10/2017   Procedure: TRANSESOPHAGEAL ECHOCARDIOGRAM (TEE);  Surgeon: Ivin Poot, MD;  Location: [place];  Service: Open Heart Surgery;  Laterality: N/A;   Social History:  reports that he quit smoking about 43 years ago. His smoking use included cigarettes. He has a 30.00 pack-year smoking history. He has never used smokeless tobacco. He reports that he does not drink alcohol and does not use drugs.  Allergies  Allergen Reactions   Blood-Group Specific Substance Other (See Comments)    Patient received almost 2 units FFP during a procedure. Became hypotensive and developed rash   Cortisone Acetate [Cortisone] Rash and Other (See Comments)    Family History  Problem Relation Age of Onset   Cancer Father    Heart disease Father    Heart attack Father    Cancer Mother    Heart disease Brother    Diabetes Brother    Prostate cancer Brother    Diabetes Sister    Bladder Cancer Neg Hx    Kidney disease Neg Hx     Prior to Admission medications   Medication Sig Start Date End Date Taking? Authorizing Provider  acetaminophen (TYLENOL) 500 MG tablet Take 2 tablets (1,000 mg total) by mouth every 6 (six) hours as needed. 04/15/17  Yes Barrett, Erin R, PA-C  amLODipine-benazepril (LOTREL) 5-20 MG capsule Take 1 capsule by mouth daily. 01/17/21  Yes [provider]  aspirin 81 MG tablet Take 1 tablet (81 mg total) by mouth  daily. 06/20/17  Yes Dunn, Dayna N, PA-C  atorvastatin (LIPITOR) 80 MG tablet TAKE 1 TABLET AT BEDTIME 05/17/22  Yes Gollan, Kathlene November, MD  carvedilol (COREG) 12.5 MG tablet TAKE 1 TABLET TWICE DAILY WITH A MEAL 08/22/22  Yes Gollan, Kathlene November, MD  clopidogrel (PLAVIX) 75 MG tablet Take 1 tablet (75 mg total) by mouth daily. 08/22/22  Yes Gollan, Kathlene November, MD  docusate sodium (COLACE) 100 MG capsule Take 100 mg by mouth 2 (two) times daily as needed.    Yes [provider]  FEROSUL 325 (65 Fe) MG tablet TAKE 1 TABLET EVERY DAY 01/01/20  Yes Earlie Server, MD  furosemide (LASIX) 20 MG tablet TAKE 1 TABLET EVERY DAY 08/02/20  Yes Gollan, Kathlene November, MD  gabapentin (NEURONTIN) 800 MG tablet Take 1,600 mg by mouth 3 (three) times daily.   Yes [provider]  meclizine (ANTIVERT) 25 MG tablet TAKE 1 TABLET TWICE DAILY AS NEEDED FOR DIZZINESS 06/27/22  Yes Gollan, Kathlene November,  MD  metFORMIN (GLUCOPHAGE-XR) 500 MG 24 hr tablet Take 500 mg by mouth 2 (two) times daily.  05/29/17  Yes [provider]  NARCAN 4 MG/0.1ML LIQD nasal spray kit 1 spray once. 02/24/22  Yes [provider]  nitroGLYCERIN (NITROSTAT) 0.4 MG SL tablet Place 1 tablet (0.4 mg total) under the tongue every 5 (five) minutes as needed for chest pain. 05/11/20  Yes Gollan, Kathlene November, MD  oxyCODONE-acetaminophen (PERCOCET) 10-325 MG tablet Take 1 tablet by mouth every 4 (four) hours as needed for pain. 04/15/17  Yes Barrett, Erin R, PA-C  pantoprazole (PROTONIX) 40 MG tablet TAKE 1 TABLET EVERY DAY 06/27/22  Yes Gollan, Kathlene November, MD  valACYclovir (VALTREX) 1000 MG tablet Take 1,000 mg by mouth as needed. 01/06/20  Yes [provider]  vitamin B-12 (CYANOCOBALAMIN) 1000 MCG tablet  09/17/19  Yes [provider]  VITAMIN D-1000 MAX ST 25 MCG (1000 UT) tablet Take 1,000 Units by mouth daily. 07/22/20  Yes [provider]  [place] test strip  11/20/19   [provider]  Accu-Chek  Softclix Lancets lancets  11/20/19   [provider]  albuterol (PROVENTIL HFA;VENTOLIN HFA) 108 (90 Base) MCG/ACT inhaler Inhale into the lungs. Patient not taking: Reported on 03/20/2023    [provider]  Alcohol Swabs (B-D SINGLE USE SWABS REGULAR) PADS  11/17/19   [provider]  fluticasone (FLONASE) 50 MCG/ACT nasal spray Place into the nose. Patient not taking: Reported on 03/20/2023    [provider]    Physical Exam: Vitals:   03/20/23 1033 03/20/23 1034  BP:  (!) 101/52  Pulse: 71   Resp: 18   Temp: 98.2 F (36.8 C)   TempSrc: Oral   SpO2: 96%   Weight: 85.8 kg   Height: 5\' 11"  (1.803 m)    Physical Exam Constitutional:      Appearance: He is obese.  HENT:     Head: Normocephalic and atraumatic.     Mouth/Throat:     Mouth: Mucous membranes are dry.  Eyes:     Pupils: Pupils are equal, round, and reactive to light.  Cardiovascular:     Rate and Rhythm: Normal rate and regular rhythm.  Pulmonary:     Effort: Pulmonary effort is normal.  Abdominal:     General: Bowel sounds are normal.  Musculoskeletal:        General: Normal range of motion.     Comments: See pic  Skin:    General: Skin is dry.     Comments: See pic  Neurological:     General: No focal deficit present.  Psychiatric:        Mood and Affect: Mood normal.     Data Reviewed:  There are no new results to review at this time. MR FOOT RIGHT WO CONTRAST CLINICAL DATA:  Lateral plantar foot ulcer.  EXAM: MRI OF THE RIGHT FOREFOOT WITHOUT CONTRAST  TECHNIQUE: Multiplanar, multisequence MR imaging of the right forefoot was performed. No intravenous contrast was administered.  COMPARISON:  Right foot x-rays from same day.  FINDINGS: Bones/Joint/Cartilage  No marrow signal abnormality. No fracture or dislocation. Minimal degenerative changes of the first MTP joint. Small fifth MTP joint effusion.  Ligaments  Collateral ligaments are  intact.  Muscles and Tendons Flexor and extensor tendons are intact. No tenosynovitis. Complete fatty atrophy of the of the intrinsic foot muscles.  Soft tissue Ulceration along the plantar aspect of the fifth metatarsal head and  fifth MTP joint with underlying 2.0 x 0.6 x 2.5 cm fluid collection (series 11, image 26; series 9, image 18). Ulceration extends to the fifth MTP joint capsule. Prominent dorsal foot soft tissue swelling. No soft tissue mass.  IMPRESSION: 1. Deep ulceration along the plantar aspect of the fifth metatarsal head and fifth MTP joint with underlying 2.5 cm abscess. 2. Ulceration extends to the fifth MTP joint capsule and there is an underlying small joint effusion, which may be reactive, although early septic arthritis is not excluded. No evidence of osteomyelitis.  Electronically Signed   By: Titus Dubin M.D.   On: 03/20/2023 15:31 DG Foot Complete Right CLINICAL DATA:  Lateral plantar foot wound.  EXAM: RIGHT FOOT COMPLETE - 3+ VIEW  COMPARISON:  None Available.  FINDINGS: Superficial plantar soft tissue ulceration at the level of the fifth MTP joint with associated soft tissue swelling. No underlying bony destruction or periosteal reaction. No acute fracture or dislocation. Minimal degenerative changes of the first MTP joint. Bone mineralization is normal.  IMPRESSION: 1. Superficial plantar soft tissue ulceration at the level of the fifth MTP joint. No radiographic evidence of osteomyelitis.  Electronically Signed   By: Titus Dubin M.D.   On: 03/20/2023 13:51  Lab Results  Component Value Date   WBC 14.1 (H) 03/20/2023   HGB 10.5 (L) 03/20/2023   HCT 31.6 (L) 03/20/2023   MCV 87.8 03/20/2023   PLT 130 (L) [id number]   Last metabolic panel Lab Results  Component Value Date   GLUCOSE 159 (H) 03/20/2023   NA 131 (L) 03/20/2023   K 4.1 03/20/2023   CL 101 03/20/2023   CO2 21 (L) 03/20/2023   BUN 34 (H) 03/20/2023    CREATININE 2.38 (H) 03/20/2023   GFRNONAA 28 (L) 03/20/2023   CALCIUM 7.8 (L) 03/20/2023   PROT 6.6 03/20/2023   ALBUMIN 3.4 (L) 03/20/2023   LABGLOB 3.1 10/21/2019   BILITOT 1.5 (H) 03/20/2023   ALKPHOS 57 03/20/2023   AST 17 03/20/2023   ALT 17 03/20/2023   ANIONGAP 9 03/20/2023    Assessment and Plan: Diabetic foot infection Worsening right lower extremity lateral foot redness and swelling in the setting of baseline diabetic foot infection MRI with deep ulceration along the plantar aspect of the fifth metatarsal head and fifth MTP joint with underlying 2.5 cm abscess. Dr. Cleda Mccreedy with podiatry consulted w/ plan for operative intervention  Per ER physician, vascular surgery with Dr. Lucky Cowboy also consulted with concern for possible vascular compromise IV Rocephin Flagyl and vancomycin for infectious coverage Follow-up subspecialist recommendations  AKI (acute kidney injury) Cr 2.4 today w/ baseline Cr around 1  Suspect prerenal etiology  Will gently hydrate pt in setting of CHF  Check FEUN in setting of lasix use  Hold offending agents  Renal ultrasound  Follow    CHF (congestive heart failure) April 20 eighteen 2D echo with EF of 50 to 55%  Euvolemic Continue home regimen  COPD (chronic obstructive pulmonary disease) Stable from respiratory standpoint Continue home inhalers  GERD (gastroesophageal reflux disease) PPI  Dyslipidemia Continue statin  CAD (coronary artery disease) of bypass graft Coronary artery disease status post CABG as well as stenting No active chest pain Continue home regimen  Follow  Diabetes Sliding-scale insulin   Benign essential HTN BP stable Titrate home regimen      Advance Care Planning:   Code Status: Full Code   Consults: Dr. Cleda Mccreedy w/ podiatry, Dr. Lucky Cowboy w/ vascular surgery per  Family Communication: No family at the bedside   Severity of Illness: The appropriate patient status for this patient is INPATIENT. Inpatient  status is judged to be reasonable and necessary in order to provide the required intensity of service to ensure the patient's safety. The patient's presenting symptoms, physical exam findings, and initial radiographic and laboratory data in the context of their chronic comorbidities is felt to place them at high risk for further clinical deterioration. Furthermore, it is not anticipated that the patient will be medically stable for discharge from the hospital within 2 midnights of admission.   * I certify that at the point of admission it is my clinical judgment that the patient will require inpatient hospital care spanning beyond 2 midnights from the point of admission due to high intensity of service, high risk for further deterioration and high frequency of surveillance required.*  Author: Deneise Lever, MD 03/20/2023 4:09 PM  For on call review [email or web address].

## 2023-03-20 NOTE — Assessment & Plan Note (Signed)
Worsening right lower extremity lateral foot redness and swelling in the setting of baseline diabetic foot infection MRI with deep ulceration along the plantar aspect of the fifth metatarsal head and fifth MTP joint with underlying 2.5 cm abscess. Dr. Cleda Mccreedy with podiatry consulted w/ plan for operative intervention  Per ER physician, vascular surgery with Dr. Lucky Cowboy also consulted with concern for possible vascular compromise IV Rocephin Flagyl and vancomycin for infectious coverage Follow-up subspecialist recommendations

## 2023-03-20 NOTE — Assessment & Plan Note (Signed)
PPI ?

## 2023-03-20 NOTE — Progress Notes (Signed)
Pharmacy Antibiotic Note  Kenneth Duarte is a 77 y.o. 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  Pharmacy has been consulted for Vancomycin dosing.  Plan: Vancomycin 1750 mg x 1 given in ED.   Vancomycin 1750 mg IV Q 48 hrs. Goal AUC 400-550. Expected AUC: 497.5/Cmin 10.3 SCr used: 2.38 No Scr in over 3 years.  If significant change, d/c and re-order new dosing or use variable dosing.   Height: 5\' 11"  (180.3 cm) Weight: 85.8 kg (189 lb 2.5 oz) IBW/kg (Calculated) : 75.3  Temp (24hrs), Avg:98.2 F (36.8 C), Min:98.2 F (36.8 C), Max:98.2 F (36.8 C)  Recent Labs  Lab 03/20/23 1034 03/20/23 1159  WBC 14.1*  --   CREATININE 2.38*  --   LATICACIDVEN  --  1.2    Estimated Creatinine Clearance: 29 mL/min (A) (by C-G formula based on SCr of 2.38 mg/dL (H)).    Allergies  Allergen Reactions   Blood-Group Specific Substance Other (See Comments)    Patient received almost 2 units FFP during a procedure. Became hypotensive and developed rash   Cortisone Acetate [Cortisone] Rash and Other (See Comments)    Antimicrobials this admission: 4/2 Vancomycin >>  4/2 CRO >>  4/2 Metronidazole >> 4/2 Zosyn x 1 dose in ED   Dose adjustments this admission:   Microbiology results:   Thank you for allowing pharmacy to be a part of this patient's care.  Alison Murray 03/20/2023 4:26 PM

## 2023-03-20 NOTE — Assessment & Plan Note (Signed)
Cr 2.4 today w/ baseline Cr around 1  Suspect prerenal etiology  Will gently hydrate pt in setting of CHF  Check FEUN in setting of lasix use  Hold offending agents  Renal ultrasound  Follow

## 2023-03-20 NOTE — Consult Note (Signed)
Hospital Consult    Reason for Consult:  Right Foot Non Healing Ulcer/Wound Requesting Physician:  Dr Sharlotte Alamo MD MRN #:  [id number]  History of Present Illness: This is a 77 y.o. male with a history of hypertension GERD diabetes who was sent to the ED from podiatry clinic due to right foot ulcer with increased depth and signs of infection. He has been managed by podiatry clinic for the past several weeks with an office debridement without improvement. Dr. Cleda Mccreedy who recommends hospitalization for IV antibiotics, MRI to rule out osteomyelitis, vascular consult, and OR debridement.   On examination today the patient is resting comfortably in a stretcher in the emergency department.  Patient's right foot is swollen and red.  Patient's edema is +3 from ankle to toes.  Patient noted to have an open ulcer on right outer aspect of the foot.  Ulcer is open, bleeding and draining serous fluid this afternoon.  Patient endorses he has no pain at this time due to his diabetic neuropathy.  Patient states this all started 2 days ago after soaking the ulcer in Epsom salt and warm water.  No other complaints at this time  Past Medical History:  Diagnosis Date   Arthritis    Chronic pain syndrome    Congestive heart failure ([place]) 1980   Coronary artery disease    a. s/p 3V CABG (03/2017). b. NSTEMI 06/2017 with progressive graft disease, s/p DES to SVG-OM.   Diabetes ([place])    DIET   Dyspnea    with exertion   ED (erectile dysfunction)    GERD (gastroesophageal reflux disease)    Gout    Headache    Hyperlipemia    Hypertension    CONTROLLED ON MEDS   IDA (iron deficiency anemia) 10/21/2019   Neuropathy of both feet    Seborrheic keratosis    Sinus congestion    Vertigo    Wears dentures    PARTIAL UPPER    Past Surgical History:  Procedure Laterality Date   COLONOSCOPY WITH PROPOFOL N/A 10/25/2015   Procedure: COLONOSCOPY WITH PROPOFOL;  Surgeon: Lucilla Lame, MD;  Location: [place];  Service: Endoscopy;  Laterality: N/A;  DIABETIC-ORAL MEDS   COLONOSCOPY WITH PROPOFOL N/A 10/22/2020   Procedure: COLONOSCOPY CANCELED;  Surgeon: Lucilla Lame, MD;  Location: [place];  Service: Endoscopy;  Laterality: N/A;  procedure aborted poor prep   COLONOSCOPY WITH PROPOFOL N/A 10/25/2020   Procedure: COLONOSCOPY WITH PROPOFOL;  Surgeon: Lucilla Lame, MD;  Location: [place];  Service: Endoscopy;  Laterality: N/A;   CORONARY ARTERY BYPASS GRAFT N/A 04/10/2017   Procedure: CORONARY ARTERY BYPASS GRAFTING (CABG) x three , using left internal mammary artery and right leg greater saphenous vein harvested endoscopically;  Surgeon: Ivin Poot, MD;  Location: [place];  Service: Open Heart Surgery;  Laterality: N/A;   CORONARY ARTERY BYPASS GRAFT     CORONARY STENT INTERVENTION N/A 06/19/2017   Procedure: Coronary Stent Intervention;  Surgeon: Jettie Booze, MD;  Location: [facility];  Service: Cardiovascular;  Laterality: N/A;   ESOPHAGOGASTRODUODENOSCOPY (EGD) WITH PROPOFOL N/A 10/22/2020   Procedure: ESOPHAGOGASTRODUODENOSCOPY (EGD) WITH PROPOFOL;  Surgeon: Lucilla Lame, MD;  Location: [facility] [place];  Service: Endoscopy;  Laterality: N/A;  Diabetic - oral meds   KNEE SURGERY Right    over 20 years ago   LEFT HEART CATH AND CORONARY ANGIOGRAPHY Left 02/22/2017   Procedure: Left Heart Cath and Coronary Angiography;  Surgeon: Darnell Level  Kelli Hope, MD;  Location: [facility];  Service: Cardiovascular;  Laterality: Left;   LEFT HEART CATH AND CORS/GRAFTS ANGIOGRAPHY N/A 06/18/2017   Procedure: Left Heart Cath and Cors/Grafts Angiography and PCI;  Surgeon: Isaias Cowman, MD;  Location: [facility];  Service: Cardiovascular;  Laterality: N/A;   POLYPECTOMY  10/25/2015   Procedure: POLYPECTOMY;  Surgeon: Lucilla Lame, MD;  Location: [facility];  Service: Endoscopy;;   ROTATOR CUFF REPAIR Right 2012   TEE WITHOUT CARDIOVERSION N/A  04/10/2017   Procedure: TRANSESOPHAGEAL ECHOCARDIOGRAM (TEE);  Surgeon: Ivin Poot, MD;  Location: [facility];  Service: Open Heart Surgery;  Laterality: N/A;    Allergies  Allergen Reactions   Blood-Group Specific Substance Other (See Comments)    Patient received almost 2 units FFP during a procedure. Became hypotensive and developed rash   Cortisone Acetate [Cortisone] Rash and Other (See Comments)    Prior to Admission medications   Medication Sig Start Date End Date Taking? Authorizing Provider  ACCU-CHEK AVIVA PLUS test strip  11/20/19   [provider]  Accu-Chek Softclix Lancets lancets  11/20/19   [provider]  acetaminophen (TYLENOL) 500 MG tablet Take 2 tablets (1,000 mg total) by mouth every 6 (six) hours as needed. Patient not taking: Reported on 05/10/2022 04/15/17   Barrett, Erin R, PA-C  albuterol (PROVENTIL HFA;VENTOLIN HFA) 108 (90 Base) MCG/ACT inhaler Inhale into the lungs.    [provider]  Alcohol Swabs (B-D SINGLE USE SWABS REGULAR) PADS  11/17/19   [provider]  amLODipine-benazepril (LOTREL) 5-20 MG capsule Take 1 capsule by mouth daily. 01/17/21   [provider]  aspirin 81 MG tablet Take 1 tablet (81 mg total) by mouth daily. 06/20/17   Dunn, Nedra Hai, PA-C  atorvastatin (LIPITOR) 80 MG tablet TAKE 1 TABLET AT BEDTIME 05/17/22   Minna Merritts, MD  carvedilol (COREG) 12.5 MG tablet TAKE 1 TABLET TWICE DAILY WITH A MEAL 08/22/22   Gollan, Kathlene November, MD  clopidogrel (PLAVIX) 75 MG tablet Take 1 tablet (75 mg total) by mouth daily. 08/22/22   Minna Merritts, MD  docusate sodium (COLACE) 100 MG capsule Take 100 mg by mouth 2 (two) times daily as needed.     [provider]  FEROSUL 325 (65 Fe) MG tablet TAKE 1 TABLET EVERY DAY 01/01/20   Earlie Server, MD  fluticasone [facility]) 50 MCG/ACT nasal spray Place into the nose.    [provider]  furosemide (LASIX) 20 MG tablet TAKE 1 TABLET EVERY DAY 08/02/20    Minna Merritts, MD  gabapentin (NEURONTIN) 800 MG tablet Take 1,600 mg by mouth 3 (three) times daily.    [provider]  meclizine (ANTIVERT) 25 MG tablet TAKE 1 TABLET TWICE DAILY AS NEEDED FOR DIZZINESS 06/27/22   Minna Merritts, MD  metFORMIN (GLUCOPHAGE-XR) 500 MG 24 hr tablet Take 500 mg by mouth 2 (two) times daily.  05/29/17   [provider]  NARCAN 4 MG/0.1ML LIQD nasal spray kit 1 spray once. 02/24/22   [provider]  nitroGLYCERIN (NITROSTAT) 0.4 MG SL tablet Place 1 tablet (0.4 mg total) under the tongue every 5 (five) minutes as needed for chest pain. 05/11/20   Minna Merritts, MD  oxyCODONE-acetaminophen (PERCOCET) 10-325 MG tablet Take 1 tablet by mouth every 4 (four) hours as needed for pain. 04/15/17   Barrett, Erin R, PA-C  pantoprazole (PROTONIX) 40 MG tablet TAKE 1 TABLET EVERY DAY  06/27/22   Minna Merritts, MD  valACYclovir (VALTREX) 1000 MG tablet Take 1,000 mg by mouth as needed. 01/06/20   [provider]  vitamin B-12 (CYANOCOBALAMIN) 1000 MCG tablet  09/17/19   [provider]  VITAMIN D-1000 MAX ST 25 MCG (1000 UT) tablet Take 1,000 Units by mouth daily. 07/22/20   [provider]    Social History   Socioeconomic History   Marital status: Married    Spouse name: Not on file   Number of children: Not on file   Years of education: Not on file   Highest education level: Not on file  Occupational History   Not on file  Tobacco Use   Smoking status: Former    Packs/day: 2.00    Years: 15.00    Additional pack years: 0.00    Total pack years: 30.00    Types: Cigarettes    Quit date: 06/30/1979    Years since quitting: 43.7   Smokeless tobacco: Never  Vaping Use   Vaping Use: Never used  Substance and Sexual Activity   Alcohol use: No    Alcohol/week: 0.0 standard drinks of alcohol    Comment: QUIT IN 1980   Drug use: No   Sexual activity: Not on file  Other Topics Concern   Not on file  Social  History Narrative   Not on file   Social Determinants of Health   Financial Resource Strain: Not on file  Food Insecurity: Not on file  Transportation Needs: Not on file  Physical Activity: Not on file  Stress: Not on file  Social Connections: Not on file  Intimate Partner Violence: Not on file     Family History  Problem Relation Age of Onset   Cancer Father    Heart disease Father    Heart attack Father    Cancer Mother    Heart disease Brother    Diabetes Brother    Prostate cancer Brother    Diabetes Sister    Bladder Cancer Neg Hx    Kidney disease Neg Hx     ROS: Otherwise negative unless mentioned in HPI  Physical Examination  Vitals:   03/20/23 1033 03/20/23 1034  BP:  (!) 101/52  Pulse: 71   Resp: 18   Temp: 98.2 F (36.8 C)   SpO2: 96%    Body mass index is 26.38 kg/m.  General:  WDWN in NAD Gait: Not observed HENT: WNL, normocephalic Pulmonary: normal non-labored breathing, without Rales, rhonchi,  wheezing Cardiac: regular, without  Murmurs, rubs or gallops; without carotid bruits Abdomen: Positive bowel sounds, soft, NT/ND, no masses Skin: without rashes Vascular Exam/Pulses: Unable to palpate pulse in right lower extremity due to edema. All extremities warm to touch.  Extremities: with ischemic changes, without Gangrene , with cellulitis; with open wounds;  Musculoskeletal: no muscle wasting or atrophy  Neurologic: A&O X 3;  No focal weakness or paresthesias are detected; speech is fluent/normal Psychiatric:  The pt has Normal affect. Lymph:  Unremarkable  CBC    Component Value Date/Time   WBC 14.1 (H) 03/20/2023 1034   RBC 3.60 (L) 03/20/2023 1034   HGB 10.5 (L) 03/20/2023 1034   HGB 13.4 03/06/2014 1708   HCT 31.6 (L) 03/20/2023 1034   HCT 39.2 (L) 03/06/2014 1708   PLT 130 (L) 03/20/2023 1034   PLT 198 03/06/2014 1708   MCV 87.8 03/20/2023 1034   MCV 87 03/06/2014 1708   MCH 29.2 03/20/2023 1034   MCHC  33.2 03/20/2023 1034    RDW 14.7 03/20/2023 1034   RDW 16.8 (H) 03/06/2014 1708   LYMPHSABS 2.6 11/22/2020 1255   MONOABS 0.6 11/22/2020 1255   EOSABS 0.1 11/22/2020 1255   BASOSABS 0.1 11/22/2020 1255    BMET    Component Value Date/Time   NA 131 (L) 03/20/2023 1034   NA 137 03/06/2014 1708   K 4.1 03/20/2023 1034   K 3.8 03/06/2014 1708   CL 101 03/20/2023 1034   CL 106 03/06/2014 1708   CO2 21 (L) 03/20/2023 1034   CO2 29 03/06/2014 1708   GLUCOSE 159 (H) 03/20/2023 1034   GLUCOSE 139 (H) 03/06/2014 1708   BUN 34 (H) 03/20/2023 1034   BUN 8 03/06/2014 1708   CREATININE 2.38 (H) 03/20/2023 1034   CREATININE 0.96 03/06/2014 1708   CALCIUM 7.8 (L) 03/20/2023 1034   CALCIUM 8.6 03/06/2014 1708   GFRNONAA 28 (L) 03/20/2023 1034   GFRNONAA >60 03/06/2014 1708   GFRAA >60 11/10/2019 0920   GFRAA >60 03/06/2014 1708    COAGS: Lab Results  Component Value Date   INR 1.17 06/19/2017   INR 1.21 06/18/2017   INR 1.07 06/18/2017     Non-Invasive Vascular Imaging:   MRI of Rt Foot  IMPRESSION: 1. Deep ulceration along the plantar aspect of the fifth metatarsal head and fifth MTP joint with underlying 2.5 cm abscess. 2. Ulceration extends to the fifth MTP joint capsule and there is an underlying small joint effusion, which may be reactive, although early septic arthritis is not excluded. No evidence of osteomyelitis.   Statin:  Yes.   Beta Blocker:  Yes.   Aspirin:  Yes.   ACEI:  Yes.   ARB:  Yes.   CCB use:  No Other antiplatelets/anticoagulants:  Yes.   Plavix 75 MG Daily   ASSESSMENT/PLAN: This is a 77 y.o. 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   PLAN: Patient will be taken to the vascular lab for right lower extremity angiogram on 03/23/2023  I discussed in detail with the patient the procedure, benefits, risks, and complications.  Patient verbalizes understanding.  I answered all the patient's  questions.  He would like to proceed as soon as possible.  Patient will be n.p.o. Thursday night for procedure on Friday.   -I discussed the plan in detail with Dr. Leotis Pain MD and he is in agreement with the plan   Drema Pry [facility] 03/20/2023 3:17 PM

## 2023-03-20 NOTE — ED Triage Notes (Signed)
Pt here with right foot pain since [place]. Pt is being followed by his foot doctor and has neuropathy in that foot. Pt also has hx of DM and PVD.

## 2023-03-20 NOTE — Consult Note (Signed)
PHARMACY -  BRIEF ANTIBIOTIC NOTE   Pharmacy has received consult(s) for Vancomycin from an ED provider.  The patient's profile has been reviewed for ht/wt/allergies/indication/available labs.    One time order(s) placed for Vancomycin 1750 mg IV   Further antibiotics/pharmacy consults should be ordered by admitting physician if indicated.                       Thank you, Alison Murray 03/20/2023  2:49 PM

## 2023-03-20 NOTE — Assessment & Plan Note (Signed)
Sliding scale insulin 

## 2023-03-20 NOTE — Assessment & Plan Note (Signed)
Continue statin. 

## 2023-03-20 NOTE — ED Notes (Signed)
Patient transported to MRI 

## 2023-03-20 NOTE — Consult Note (Signed)
Reason for Consult: Full-thickness ulceration with necrosis and cellulitis right foot. Referring Physician: Massie Duarte Kenneth Duarte is an 77 y.o. male.  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  Was just seen last week and the ulcer was very stable at that point.  States that a few days ago he started to notice some increased redness and swelling in the foot with some recent drainage from the wound.  Was seen in the office today and due to the extent of infection and necrotic tissue in the ulceration decision was made for hospitalization for MRI and surgical debridement with vascular evaluation.  Past Medical History:  Diagnosis Date   Arthritis    Chronic pain syndrome    Congestive heart failure ([place]) 1980   Coronary artery disease    a. s/p 3V CABG (03/2017). b. NSTEMI 06/2017 with progressive graft disease, s/p DES to SVG-OM.   Diabetes ([place])    DIET   Dyspnea    with exertion   ED (erectile dysfunction)    GERD (gastroesophageal reflux disease)    Gout    Headache    Hyperlipemia    Hypertension    CONTROLLED ON MEDS   IDA (iron deficiency anemia) 10/21/2019   Neuropathy of both feet    Seborrheic keratosis    Sinus congestion    Vertigo    Wears dentures    PARTIAL UPPER    Past Surgical History:  Procedure Laterality Date   COLONOSCOPY WITH PROPOFOL N/A 10/25/2015   Procedure: COLONOSCOPY WITH PROPOFOL;  Surgeon: Lucilla Lame, MD;  Location: [place];  Service: Endoscopy;  Laterality: N/A;  DIABETIC-ORAL MEDS   COLONOSCOPY WITH PROPOFOL N/A 10/22/2020   Procedure: COLONOSCOPY CANCELED;  Surgeon: Lucilla Lame, MD;  Location: [place];  Service: Endoscopy;  Laterality: N/A;  procedure aborted poor prep   COLONOSCOPY WITH PROPOFOL N/A 10/25/2020   Procedure: COLONOSCOPY WITH PROPOFOL;  Surgeon: Lucilla Lame, MD;  Location: [place];  Service: Endoscopy;  Laterality: N/A;   CORONARY ARTERY BYPASS GRAFT N/A 04/10/2017   Procedure: CORONARY ARTERY BYPASS GRAFTING (CABG) x three , using left internal mammary artery and right leg greater saphenous vein harvested endoscopically;  Surgeon: Ivin Poot, MD;  Location: [place];  Service: Open Heart Surgery;  Laterality: N/A;   CORONARY ARTERY BYPASS GRAFT     CORONARY STENT INTERVENTION N/A 06/19/2017   Procedure: Coronary Stent Intervention;  Surgeon: Jettie Booze, MD;  Location: [place] [facility];  Service: Cardiovascular;  Laterality: N/A;   ESOPHAGOGASTRODUODENOSCOPY (EGD) WITH PROPOFOL N/A 10/22/2020   Procedure: ESOPHAGOGASTRODUODENOSCOPY (EGD) WITH PROPOFOL;  Surgeon: Lucilla Lame, MD;  Location: [place];  Service: Endoscopy;  Laterality: N/A;  Diabetic - oral meds   KNEE SURGERY Right    over 20 years ago   LEFT HEART CATH AND CORONARY ANGIOGRAPHY Left 02/22/2017   Procedure: Left Heart Cath and Coronary Angiography;  Surgeon: Corey Skains, MD;  Location: [facility] [facility];  Service: Cardiovascular;  Laterality: Left;   LEFT HEART CATH AND CORS/GRAFTS ANGIOGRAPHY N/A 06/18/2017   Procedure: Left Heart Cath and Cors/Grafts Angiography and PCI;  Surgeon: Isaias Cowman, MD;  Location: [facility] [facility];  Service: Cardiovascular;  Laterality: N/A;   POLYPECTOMY  10/25/2015   Procedure: POLYPECTOMY;  Surgeon: Lucilla Lame, MD;  Location: [place];  Service: Endoscopy;;   ROTATOR CUFF  REPAIR Right 2012   TEE WITHOUT CARDIOVERSION N/A 04/10/2017   Procedure: TRANSESOPHAGEAL ECHOCARDIOGRAM (TEE);  Surgeon: Ivin Poot, MD;  Location: [place];  Service: Open Heart Surgery;  Laterality: N/A;    Family History  Problem Relation Age of Onset   Cancer Father    Heart disease Father    Heart attack Father    Cancer Mother    Heart disease Brother    Diabetes Brother    Prostate cancer Brother    Diabetes Sister    Bladder Cancer  Neg Hx    Kidney disease Neg Hx     Social History:  reports that he quit smoking about 43 years ago. His smoking use included cigarettes. He has a 30.00 pack-year smoking history. He has never used smokeless tobacco. He reports that he does not drink alcohol and does not use drugs.  Allergies:  Allergies  Allergen Reactions   Blood-Group Specific Substance Other (See Comments)    Patient received almost 2 units FFP during a procedure. Became hypotensive and developed rash   Cortisone Acetate [Cortisone] Rash and Other (See Comments)    Medications: As scheduled.  Results for orders placed or performed during the hospital encounter of 03/20/23 (from the past 48 hour(s))  CBC     Status: Abnormal   Collection Time: 03/20/23 10:34 AM  Result Value Ref Range   WBC 14.1 (H) 4.0 - 10.5 K/uL   RBC 3.60 (L) 4.22 - 5.81 MIL/uL   Hemoglobin 10.5 (L) 13.0 - 17.0 g/dL   HCT 31.6 (L) 39.0 - 52.0 %   MCV 87.8 80.0 - 100.0 fL   MCH 29.2 26.0 - 34.0 pg   MCHC 33.2 30.0 - 36.0 g/dL   RDW 14.7 11.5 - 15.5 %   Platelets 130 (L) 150 - 400 K/uL   nRBC 0.0 0.0 - 0.2 %    Comment: Performed at [facility], [address] [place], [place] [address]  Comprehensive metabolic panel     Status: Abnormal   Collection Time: 03/20/23 10:34 AM  Result Value Ref Range   Sodium 131 (L) 135 - 145 mmol/L   Potassium 4.1 3.5 - 5.1 mmol/L   Chloride 101 98 - 111 mmol/L   CO2 21 (L) 22 - 32 mmol/L   Glucose, Bld 159 (H) 70 - 99 mg/dL    Comment: Glucose reference range applies only to samples taken after fasting for at least 8 hours.   BUN 34 (H) 8 - 23 mg/dL   Creatinine, Ser 2.38 (H) 0.61 - 1.24 mg/dL   Calcium 7.8 (L) 8.9 - 10.3 mg/dL   Total Protein 6.6 6.5 - 8.1 g/dL   Albumin 3.4 (L) 3.5 - 5.0 g/dL   AST 17 15 - 41 U/L   ALT 17 0 - 44 U/L   Alkaline Phosphatase 57 38 - 126 U/L   Total Bilirubin 1.5 (H) 0.3 - 1.2 mg/dL   GFR, Estimated 28 (L) >60 mL/min    Comment: (NOTE) Calculated  using the CKD-EPI Creatinine Equation (2021)    Anion gap 9 5 - 15    Comment: Performed at [facility], [address] [place], [place] [address]  Lactic acid, plasma     Status: None   Collection Time: 03/20/23 11:59 AM  Result Value Ref Range   Lactic Acid, Venous 1.2 0.5 - 1.9 mmol/L    Comment: Performed at [facility], [address], [place], [place] [address]  DG Foot Complete Right  Result Date: 03/20/2023 CLINICAL DATA:  Lateral plantar foot wound. EXAM: RIGHT FOOT COMPLETE - 3+ VIEW COMPARISON:  None Available. FINDINGS: Superficial plantar soft tissue ulceration at the level of the fifth MTP joint with associated soft tissue swelling. No underlying bony destruction or periosteal reaction. No acute fracture or dislocation. Minimal degenerative changes of the first MTP joint. Bone mineralization is normal. IMPRESSION: 1. Superficial plantar soft tissue ulceration at the level of the fifth MTP joint. No radiographic evidence of osteomyelitis. Electronically Signed   By: Titus Dubin M.D.   On: 03/20/2023 13:51    Review of Systems  Constitutional:  Negative for chills and fever.  HENT:  Negative for sinus pain and sore throat.   Respiratory:  Negative for cough and shortness of breath.   Cardiovascular:  Negative for chest pain and palpitations.  Gastrointestinal:  Negative for nausea and vomiting.  Genitourinary:  Negative for frequency and urgency.  Musculoskeletal:        Previous amputation of the left second toe.  Skin:        Relates recent flareup with some swelling and redness and discoloration on his right foot with some drainage from the ulcerative area beneath his fifth metatarsal.  Just started a few days ago.  Neurological:        Significant neuropathy associated with his diabetes.  Psychiatric/Behavioral:  Negative for confusion. The patient is not nervous/anxious.    Blood pressure (!) 101/52, pulse 71, temperature 98.2 F (36.8 C),  temperature source Oral, resp. rate 18, height 5\' 11"  (1.803 m), weight 85.8 kg, SpO2 96 %. Physical Exam Cardiovascular:     Comments: DP and PT pulses are weakly palpable on the right foot. Musculoskeletal:     Comments: Adequate range of motion of the pedal joints.  Muscle testing deferred.  Previous amputation of the left second toe.  Skin:    Comments: Significant erythema and edema noted on the right foot with a hemorrhagic appearing full-thickness ulceration on the plantar and lateral aspect of the right fifth metatarsal measuring approximately 2.5 x 2.0 cm with depth probing down about a centimeter close to the joint.  Neurological:     Comments: Loss of sensation distally in the feet and toes.        Assessment/Plan: Assessment: 1.  Full-thickness ulceration with necrosis of underlying deep tissues right foot. 2.  Cellulitis with abscess right foot. 3.  Diabetes with associated neuropathy. 4.  Peripheral vascular disease.  Plan: Plan for admission to the hospital for IV antibiotics.  We will need to obtain MRI to evaluate extent of infection.  Also will need to consult vascular surgery to assess his vascular status.  Discussed that with the patient that he will need some type of debridement including soft tissue and possibly bone with possible amputation.  Ideally I think he is stable enough to wait until after his vascular status is assessed.  Dr. Luana Shu is on-call and we will follow-up accordingly during his hospital stay.  Kenneth Duarte 03/20/2023, 2:09 PM

## 2023-03-20 NOTE — ED Provider Notes (Signed)
[facility] Provider Note    Event Date/Time   First MD Initiated Contact with Patient 03/20/23 1116     (approximate)   History   Chief Complaint: Foot Pain   HPI  Kenneth Duarte is a 77 y.o. male with a history of hypertension GERD diabetes who was sent to the ED from podiatry clinic due to right foot ulcer with increased depth and signs of infection.  He has been managed by podiatry clinic for the past several weeks with an office debridement without improvement.  He was reevaluated today and sent to the ED for further evaluation.  Case discussed with podiatry Dr. Cleda Mccreedy who recommends hospitalization for IV antibiotics, MRI to rule out osteomyelitis, vascular consult, and OR debridement.     Physical Exam   Triage Vital Signs: ED Triage Vitals  Enc Vitals Group     BP 03/20/23 1034 (!) 101/52     Pulse Rate 03/20/23 1033 71     Resp 03/20/23 1033 18     Temp 03/20/23 1033 98.2 F (36.8 C)     Temp Source 03/20/23 1033 Oral     SpO2 03/20/23 1033 96 %     Weight 03/20/23 1033 189 lb 2.5 oz (85.8 kg)     Height 03/20/23 1033 5\' 11"  (1.803 m)     Head Circumference --      Peak Flow --      Pain Score 03/20/23 1033 6     Pain Loc --      Pain Edu? --      Excl. in [place]? --     Most recent vital signs: Vitals:   03/20/23 1033 03/20/23 1034  BP:  (!) 101/52  Pulse: 71   Resp: 18   Temp: 98.2 F (36.8 C)   SpO2: 96%     General: Awake, no distress.  CV:  Good peripheral perfusion.  Thready DP pulse Resp:  Normal effort.  Abd:  No distention.  Other:  Plantar ulcer with exposed bone on the right foot.   ED Results / Procedures / Treatments   Labs (all labs ordered are listed, but only abnormal results are displayed) Labs Reviewed  CBC - Abnormal; Notable for the following components:      Result Value   WBC 14.1 (*)    RBC 3.60 (*)    Hemoglobin 10.5 (*)    HCT 31.6 (*)    Platelets 130 (*)    All other components within  normal limits  COMPREHENSIVE METABOLIC PANEL - Abnormal; Notable for the following components:   Sodium 131 (*)    CO2 21 (*)    Glucose, Bld 159 (*)    BUN 34 (*)    Creatinine, Ser 2.38 (*)    Calcium 7.8 (*)    Albumin 3.4 (*)    Total Bilirubin 1.5 (*)    GFR, Estimated 28 (*)    All other components within normal limits  LACTIC ACID, PLASMA  SEDIMENTATION RATE  C-REACTIVE PROTEIN     EKG    RADIOLOGY X-ray right foot interpreted by me, no bony degradation visible.  Radiology report reviewed.  MRI right foot pending   PROCEDURES:  Procedures   MEDICATIONS ORDERED IN ED: Medications  vancomycin (VANCOREADY) IVPB 1750 mg/350 mL (has no administration in time range)  sodium chloride 0.9 % bolus 250 mL (0 mLs Intravenous Stopped 03/20/23 1234)  HYDROmorphone (DILAUDID) injection 2 mg (2 mg Intravenous Given 03/20/23 1336)  piperacillin-tazobactam (ZOSYN) IVPB 3.375 g (0 g Intravenous Stopped 03/20/23 1418)  sodium chloride 0.9 % bolus 1,000 mL (1,000 mLs Intravenous New Bag/Given 03/20/23 1442)     IMPRESSION / MDM / ASSESSMENT AND PLAN / ED COURSE  I reviewed the triage vital signs and the nursing notes.  DDx: Cellulitis, foot abscess, osteomyelitis  Patient's presentation is most consistent with acute presentation with potential threat to life or bodily function.  Patient presents with persistent diabetic foot wound.  Has a leukocytosis of 14,000.  Vital signs are unremarkable and lactate is normal, not septic.  Creatinine is increased indicative of AKI.  Will need to admit for further management per podiatry plan.  Vancomycin and Zosyn ordered.       FINAL CLINICAL IMPRESSION(S) / ED DIAGNOSES   Final diagnoses:  Diabetic foot infection  Osteomyelitis of right foot, unspecified type  Type 2 diabetes mellitus with foot ulcer, without long-term current use of insulin  AKI (acute kidney injury)     Rx / DC Orders   ED Discharge Orders     None         Note:  This document was prepared using Dragon voice recognition software and may include unintentional dictation errors.   Carrie Mew, MD 03/20/23 [phone]

## 2023-03-20 NOTE — Assessment & Plan Note (Signed)
April 20 eighteen 2D echo with EF of 50 to 55%  Euvolemic Continue home regimen

## 2023-03-21 DIAGNOSIS — L97519 Non-pressure chronic ulcer of other part of right foot with unspecified severity: Secondary | ICD-10-CM

## 2023-03-21 DIAGNOSIS — E11628 Type 2 diabetes mellitus with other skin complications: Secondary | ICD-10-CM | POA: Diagnosis not present

## 2023-03-21 DIAGNOSIS — D696 Thrombocytopenia, unspecified: Secondary | ICD-10-CM | POA: Diagnosis not present

## 2023-03-21 DIAGNOSIS — L089 Local infection of the skin and subcutaneous tissue, unspecified: Secondary | ICD-10-CM | POA: Diagnosis not present

## 2023-03-21 DIAGNOSIS — N179 Acute kidney failure, unspecified: Secondary | ICD-10-CM | POA: Diagnosis not present

## 2023-03-21 LAB — GLUCOSE, CAPILLARY
Glucose-Capillary: 149 mg/dL — ABNORMAL HIGH (ref 70–99)
Glucose-Capillary: 133 mg/dL — ABNORMAL HIGH (ref 70–99)
Glucose-Capillary: 120 mg/dL — ABNORMAL HIGH (ref 70–99)
Glucose-Capillary: 94 mg/dL (ref 70–99)

## 2023-03-21 LAB — COMPREHENSIVE METABOLIC PANEL
ALT: 15 U/L (ref 0–44)
AST: 13 U/L — ABNORMAL LOW (ref 15–41)
Albumin: 3 g/dL — ABNORMAL LOW (ref 3.5–5.0)
Alkaline Phosphatase: 51 U/L (ref 38–126)
Anion gap: 6 (ref 5–15)
BUN: 29 mg/dL — ABNORMAL HIGH (ref 8–23)
CO2: 20 mmol/L — ABNORMAL LOW (ref 22–32)
Calcium: 7.5 mg/dL — ABNORMAL LOW (ref 8.9–10.3)
Chloride: 109 mmol/L (ref 98–111)
Creatinine, Ser: 1.67 mg/dL — ABNORMAL HIGH (ref 0.61–1.24)
GFR, Estimated: 43 mL/min — ABNORMAL LOW (ref >60)
Glucose, Bld: 122 mg/dL — ABNORMAL HIGH (ref 70–99)
Potassium: 4.4 mmol/L (ref 3.5–5.1)
Sodium: 135 mmol/L (ref 135–145)
Total Bilirubin: 0.9 mg/dL (ref 0.3–1.2)
Total Protein: 6.2 g/dL — ABNORMAL LOW (ref 6.5–8.1)

## 2023-03-21 LAB — CBC
HCT: 29.9 % — ABNORMAL LOW (ref 39.0–52.0)
Hemoglobin: 9.9 g/dL — ABNORMAL LOW (ref 13.0–17.0)
MCH: 28.9 pg (ref 26.0–34.0)
MCHC: 33.1 g/dL (ref 30.0–36.0)
MCV: 87.4 fL (ref 80.0–100.0)
Platelets: 140 10*3/uL — ABNORMAL LOW (ref 150–400)
RBC: 3.42 MIL/uL — ABNORMAL LOW (ref 4.22–5.81)
RDW: 14.7 % (ref 11.5–15.5)
WBC: 7.8 10*3/uL (ref 4.0–10.5)
nRBC: 0 % (ref 0.0–0.2)

## 2023-03-21 LAB — C-REACTIVE PROTEIN: CRP: 14.5 mg/dL — ABNORMAL HIGH (ref <1.0)

## 2023-03-21 LAB — HEMOGLOBIN A1C
Hgb A1c MFr Bld: 7 % — ABNORMAL HIGH (ref 4.8–5.6)
Mean Plasma Glucose: 154 mg/dL

## 2023-03-21 MED ORDER — VANCOMYCIN HCL 1250 MG/250ML IV SOLN
1250.0000 mg | INTRAVENOUS | Status: DC
  Administered 2023-03-21: 1250 mg via INTRAVENOUS
  Filled 2023-03-21: qty 250

## 2023-03-21 MED ORDER — POVIDONE-IODINE 10 % EX SWAB: Freq: Once | CUTANEOUS | Status: AC

## 2023-03-21 MED ORDER — POLYETHYLENE GLYCOL 3350 17 G PO PACK
17.0000 g | PACK | Freq: Every day | ORAL | Status: DC
  Administered 2023-03-21 – 2023-03-23 (×3): 17 g via ORAL
  Filled 2023-03-21 (×3): qty 1

## 2023-03-21 NOTE — Progress Notes (Signed)
PROGRESS NOTE    Kenneth Duarte  [id number] DOB: 1945/12/26 DOA: 03/20/2023 PCP: Carlester Coffin, MD  Assessment & Plan:   Active Problems:   Diabetic foot infection   Benign essential HTN   Diabetes   Dyslipidemia   GERD (gastroesophageal reflux disease)   PAD (peripheral artery disease)   COPD (chronic obstructive pulmonary disease)   CHF (congestive heart failure)   AKI (acute kidney injury)  Assessment and Plan: Diabetic right foot infection: w/ deep ulceration along the plantar aspect of the fifth metatarsal head and fifth MTP joint with underlying 2.5 cm abscess as per MRI. RLE angiogram on 03/23/23 as per vasc surg. Will go for possible I&D vs right partial 5th ray amputation tomorrow as per podiatry.  Podiatry & vasc surg following and recs apprec. Continue on IV rocephin, vanco    AKI: w/ baseline Cr around 1. Likely pre-renal. Cr is trending down today. Continue on IVFs but monitor closely as pt has hx of CHF   Likely chronic diastolic CHF: appears compensated. Continue on coreg. 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 No need for a transfusion currently    COPD: w/o exacerbation. Continue on bronchodilators   GERD: continue on PPI    HLD: continue on statin    Hx of CAD: s/p CABG & stenting. No chest pain currently. Continue on coreg, statin, aspirin & plavix    DM2: likely well controlled. Continue on SSI w/ accuchecks     HTN: continue on coreg  Thrombocytopenia: etiology unclear. Will continue to monitor     DVT prophylaxis: lovenox  Code Status: full  Family Communication:  Disposition Plan: likely d/c back home   Level of care: Telemetry Medical  Status is: Inpatient Remains inpatient appropriate because: severity of illness, will go for surgery tomorrow & angiogram on 03/23/23     Consultants:  Podiatry Vasc surg   Procedures:   Antimicrobials: rocephin, vanco   Subjective: Pt c/o pain and swelling of right foot    Objective: Vitals:   03/20/23 1033 03/20/23 1034 03/20/23 1713 03/21/23 0012  BP:  (!) 101/52 (!) 122/59 (!) 113/51  Pulse: 71  71 73  Resp: 18  17 20   Temp: 98.2 F (36.8 C)  97.9 F (36.6 C) 97.9 F (36.6 C)  TempSrc: Oral     SpO2: 96%  100% 98%  Weight: 85.8 kg     Height: 5\' 11"  (1.803 m)       Intake/Output Summary (Last 24 hours) at 03/21/2023 0843 Last data filed at 03/21/2023 0746 Gross per 24 hour  Intake 1133.76 ml  Output 1950 ml  Net -816.24 ml   Filed Weights   03/20/23 1033  Weight: 85.8 kg    Examination:  General exam: Appears calm but uncomfortable  Respiratory system: Clear to auscultation. Respiratory effort normal. Cardiovascular system: S1 & S2+. No  rubs, gallops or clicks.  Gastrointestinal system: Abdomen is nondistended, soft and nontender. Normal bowel sounds heard. Central nervous system: Alert and oriented. Moves all extremities  Psychiatry: Judgement and insight appear normal. Mood & affect appropriate.     Data Reviewed: I have personally reviewed following labs and imaging studies  CBC: Recent Labs  Lab 03/20/23 1034 03/21/23 0546  WBC 14.1* 7.8  HGB 10.5* 9.9*  HCT 31.6* 29.9*  MCV 87.8 87.4  PLT 130* [id number]*   Basic Metabolic Panel: Recent Labs  Lab 03/20/23 1034 03/21/23 0546  NA 131* 135  K 4.1 4.4  CL 101 109  CO2 21* 20*  GLUCOSE 159* 122*  BUN 34* 29*  CREATININE 2.38* 1.67*  CALCIUM 7.8* 7.5*   GFR: Estimated Creatinine Clearance: 41.3 mL/min (A) (by C-G formula based on SCr of 1.67 mg/dL (H)). Liver Function Tests: Recent Labs  Lab 03/20/23 1034 03/21/23 0546  AST 17 13*  ALT 17 15  ALKPHOS 57 51  BILITOT 1.5* 0.9  PROT 6.6 6.2*  ALBUMIN 3.4* 3.0*   No results for input(s): "LIPASE", "AMYLASE" in the last 168 hours. No results for input(s): "AMMONIA" in the last 168 hours. Coagulation Profile: No results for input(s): "INR", "PROTIME" in the last 168 hours. Cardiac Enzymes: No results for  input(s): "CKTOTAL", "CKMB", "CKMBINDEX", "TROPONINI" in the last 168 hours. BNP (last 3 results) No results for input(s): "PROBNP" in the last 8760 hours. HbA1C: No results for input(s): "HGBA1C" in the last 72 hours. CBG: Recent Labs  Lab 03/20/23 1757 03/20/23 2139  GLUCAP 104* 175*   Lipid Profile: No results for input(s): "CHOL", "HDL", "LDLCALC", "TRIG", "CHOLHDL", "LDLDIRECT" in the last 72 hours. Thyroid Function Tests: No results for input(s): "TSH", "T4TOTAL", "FREET4", "T3FREE", "THYROIDAB" in the last 72 hours. Anemia Panel: No results for input(s): "VITAMINB12", "FOLATE", "FERRITIN", "TIBC", "IRON", "RETICCTPCT" in the last 72 hours. Sepsis Labs: Recent Labs  Lab 03/20/23 1159  LATICACIDVEN 1.2    No results found for this or any previous visit (from the past 240 hour(s)).       Radiology Studies: MR FOOT RIGHT WO CONTRAST  Result Date: 03/20/2023 CLINICAL DATA:  Lateral plantar foot ulcer. EXAM: MRI OF THE RIGHT FOREFOOT WITHOUT CONTRAST TECHNIQUE: Multiplanar, multisequence MR imaging of the right forefoot was performed. No intravenous contrast was administered. COMPARISON:  Right foot x-rays from same day. FINDINGS: Bones/Joint/Cartilage No marrow signal abnormality. No fracture or dislocation. Minimal degenerative changes of the first MTP joint. Small fifth MTP joint effusion. Ligaments Collateral ligaments are intact. Muscles and Tendons Flexor and extensor tendons are intact. No tenosynovitis. Complete fatty atrophy of the of the intrinsic foot muscles. Soft tissue Ulceration along the plantar aspect of the fifth metatarsal head and fifth MTP joint with underlying 2.0 x 0.6 x 2.5 cm fluid collection (series 11, image 26; series 9, image 18). Ulceration extends to the fifth MTP joint capsule. Prominent dorsal foot soft tissue swelling. No soft tissue mass. IMPRESSION: 1. Deep ulceration along the plantar aspect of the fifth metatarsal head and fifth MTP joint with  underlying 2.5 cm abscess. 2. Ulceration extends to the fifth MTP joint capsule and there is an underlying small joint effusion, which may be reactive, although early septic arthritis is not excluded. No evidence of osteomyelitis. Electronically Signed   By: Titus Dubin M.D.   On: 03/20/2023 15:31   DG Foot Complete Right  Result Date: 03/20/2023 CLINICAL DATA:  Lateral plantar foot wound. EXAM: RIGHT FOOT COMPLETE - 3+ VIEW COMPARISON:  None Available. FINDINGS: Superficial plantar soft tissue ulceration at the level of the fifth MTP joint with associated soft tissue swelling. No underlying bony destruction or periosteal reaction. No acute fracture or dislocation. Minimal degenerative changes of the first MTP joint. Bone mineralization is normal. IMPRESSION: 1. Superficial plantar soft tissue ulceration at the level of the fifth MTP joint. No radiographic evidence of osteomyelitis. Electronically Signed   By: Titus Dubin M.D.   On: 03/20/2023 13:51        Scheduled Meds:  aspirin EC  81 mg Oral Daily   atorvastatin  80 mg Oral QHS   carvedilol  12.5 mg Oral BID WC   clopidogrel  75 mg Oral Daily   enoxaparin (LOVENOX) injection  30 mg Subcutaneous Q24H   insulin aspart  0-15 Units Subcutaneous TID WC   insulin aspart  0-5 Units Subcutaneous QHS   insulin aspart  4 Units Subcutaneous TID WC   pantoprazole  40 mg Oral Daily   Continuous Infusions:  sodium chloride 75 mL/hr at 03/20/23 1733   cefTRIAXone (ROCEPHIN)  IV 2 g (03/20/23 2209)   metronidazole 500 mg (03/20/23 2259)   [START ON 03/22/2023] vancomycin       LOS: 1 day    Time spent: 35 mins     Wyvonnia Dusky, MD [facility] Pager [phone]  If 7PM-7AM, please contact night-coverage [email or web address] 03/21/2023, 8:43 AM

## 2023-03-21 NOTE — Progress Notes (Signed)
Pharmacy Antibiotic Note  Kenneth Duarte is a 77 y.o. 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  Pharmacy has been consulted for Vancomycin dosing.  Scr improved from 2.38 to 1.67  Plan: Change vancomycin to 1250 mg IV every 24 hours Goal AUC 400-550 Estimated AUC 522.8, Cmin 14 Wt 85.8 kg, Scr 1.67, Vd coefficient 0.72 Vancomycin levels at steady state or as clinically indicated Ceftriaxone 2 grams IV every 24 hours ordered by provider Flagyl 500 mg IV every 12 hours ordered by provider   Height: 5\' 11"  (180.3 cm) Weight: 85.8 kg (189 lb 2.5 oz) IBW/kg (Calculated) : 75.3  Temp (24hrs), Avg:98 F (36.7 C), Min:97.9 F (36.6 C), Max:98.2 F (36.8 C)  Recent Labs  Lab 03/20/23 1034 03/20/23 1159 03/21/23 0546  WBC 14.1*  --  7.8  CREATININE 2.38*  --  1.67*  LATICACIDVEN  --  1.2  --      Estimated Creatinine Clearance: 41.3 mL/min (A) (by C-G formula based on SCr of 1.67 mg/dL (H)).    Allergies  Allergen Reactions   Blood-Group Specific Substance Other (See Comments)    Patient received almost 2 units FFP during a procedure. Became hypotensive and developed rash   Cortisone Acetate [Cortisone] Rash and Other (See Comments)    Antimicrobials this admission: 4/2 Vancomycin >>  4/2 CRO >>  4/2 Metronidazole >> 4/2 Zosyn x 1 dose in ED   Dose adjustments this admission: 4/3: Vancomycin 1750 mg q48h>>1250mg  q24h  Microbiology results: N/A  Thank you for allowing pharmacy to be a part of this patient's care.  Lorin Picket, PharmD 03/21/2023 9:08 AM

## 2023-03-21 NOTE — Progress Notes (Signed)
  Progress Note    03/21/2023 2:38 PM * No surgery date entered *  Subjective:  Kenneth Duarte is a 77 y.o. male with medical history significant of CHF, coronary artery disease, type 2 diabetes, GERD, gout, hyperlipidemia, hypertension presenting with diabetic foot infection.  Patient reports worsening right foot lateral redness and pain since about June of last year.  Has been followed by Dr.Cline w/ podiatry outpatient.      Vitals:   03/21/23 0012 03/21/23 0950  BP: (!) 113/51 (!) 142/62  Pulse: 73 86  Resp: 20   Temp: 97.9 F (36.6 C)   SpO2: 98%    Physical Exam: Cardiac:  RRR Lungs:  Normal Respiratory effort, clear on auscultation throughout.  Incisions:  N/A Extremities:  Right lower extremity with open ulcer. Foot red and warm to touch. Unable to palpate pulses due to swelling.  Abdomen:  Positive bowel sounds, soft, non tender, non distended.  Neurologic: AAOX4, answers questions appropriately, follows commands.   CBC    Component Value Date/Time   WBC 7.8 03/21/2023 0546   RBC 3.42 (L) 03/21/2023 0546   HGB 9.9 (L) 03/21/2023 0546   HGB 13.4 03/06/2014 1708   HCT 29.9 (L) 03/21/2023 0546   HCT 39.2 (L) 03/06/2014 1708   PLT 140 (L) 03/21/2023 0546   PLT 198 03/06/2014 1708   MCV 87.4 03/21/2023 0546   MCV 87 03/06/2014 1708   MCH 28.9 03/21/2023 0546   MCHC 33.1 03/21/2023 0546   RDW 14.7 03/21/2023 0546   RDW 16.8 (H) 03/06/2014 1708   LYMPHSABS 2.6 11/22/2020 1255   MONOABS 0.6 11/22/2020 1255   EOSABS 0.1 11/22/2020 1255   BASOSABS 0.1 11/22/2020 1255    BMET    Component Value Date/Time   NA 135 03/21/2023 0546   NA 137 03/06/2014 1708   K 4.4 03/21/2023 0546   K 3.8 03/06/2014 1708   CL 109 03/21/2023 0546   CL 106 03/06/2014 1708   CO2 20 (L) 03/21/2023 0546   CO2 29 03/06/2014 1708   GLUCOSE 122 (H) 03/21/2023 0546   GLUCOSE 139 (H) 03/06/2014 1708   BUN 29 (H) 03/21/2023 0546   BUN 8 03/06/2014 1708   CREATININE 1.67 (H)  03/21/2023 0546   CREATININE 0.96 03/06/2014 1708   CALCIUM 7.5 (L) 03/21/2023 0546   CALCIUM 8.6 03/06/2014 1708   GFRNONAA 43 (L) 03/21/2023 0546   GFRNONAA >60 03/06/2014 1708   GFRAA >60 11/10/2019 0920   GFRAA >60 03/06/2014 1708    INR    Component Value Date/Time   INR 1.17 06/19/2017 0526     Intake/Output Summary (Last 24 hours) at 03/21/2023 1438 Last data filed at 03/21/2023 1419 Gross per 24 hour  Intake 1083.76 ml  Output 3850 ml  Net -2766.24 ml     Assessment/Plan:  77 y.o. male is s/p *** * No surgery date entered *  *** DVT prophylaxis:  ***   Glora Hulgan R Erby [facility]erson [facility] 03/21/2023 2:38 PM

## 2023-03-21 NOTE — TOC CM/SW Note (Signed)
CSW acknowledges consult for home health/DME needs and medication assistance. CSW asked MD to enter PT and OT consults when appropriate. Patient has insurance so unable to assist with medications.  Dayton Scrape, [place]

## 2023-03-21 NOTE — Progress Notes (Signed)
PODIATRY / FOOT AND ANKLE SURGERY PROGRESS NOTE  Reason for consult: Right foot wound  Chief Complaint: Right foot wound/infection   HPI: Kenneth Duarte is a 77 y.o. male who presents resting in bed comfortably.  Patient does not have any dressing applied to his right foot today while examining.  Patient notes no pain to the area.  He has noticed that the wound started to become more black.  Patient does have a history of chronic pain and diabetes but states his most recent hemoglobin A1c was somewhere between 5 and 6%.  PMHx:  Past Medical History:  Diagnosis Date   Arthritis    Chronic pain syndrome    Congestive heart failure ([place]) 1980   Coronary artery disease    a. s/p 3V CABG (03/2017). b. NSTEMI 06/2017 with progressive graft disease, s/p DES to SVG-OM.   Diabetes ([place])    DIET   Dyspnea    with exertion   ED (erectile dysfunction)    GERD (gastroesophageal reflux disease)    Gout    Headache    Hyperlipemia    Hypertension    CONTROLLED ON MEDS   IDA (iron deficiency anemia) 10/21/2019   Neuropathy of both feet    Seborrheic keratosis    Sinus congestion    Vertigo    Wears dentures    PARTIAL UPPER    Surgical Hx:  Past Surgical History:  Procedure Laterality Date   COLONOSCOPY WITH PROPOFOL N/A 10/25/2015   Procedure: COLONOSCOPY WITH PROPOFOL;  Surgeon: Lucilla Lame, MD;  Location: [facility];  Service: Endoscopy;  Laterality: N/A;  DIABETIC-ORAL MEDS   COLONOSCOPY WITH PROPOFOL N/A 10/22/2020   Procedure: COLONOSCOPY CANCELED;  Surgeon: Lucilla Lame, MD;  Location: [facility];  Service: Endoscopy;  Laterality: N/A;  procedure aborted poor prep   COLONOSCOPY WITH PROPOFOL N/A 10/25/2020   Procedure: COLONOSCOPY WITH PROPOFOL;  Surgeon: Lucilla Lame, MD;  Location: [facility];  Service: Endoscopy;  Laterality: N/A;   CORONARY ARTERY BYPASS GRAFT N/A 04/10/2017   Procedure: CORONARY ARTERY BYPASS GRAFTING (CABG) x three , using left  internal mammary artery and right leg greater saphenous vein harvested endoscopically;  Surgeon: Ivin Poot, MD;  Location: [place];  Service: Open Heart Surgery;  Laterality: N/A;   CORONARY ARTERY BYPASS GRAFT     CORONARY STENT INTERVENTION N/A 06/19/2017   Procedure: Coronary Stent Intervention;  Surgeon: Jettie Booze, MD;  Location: [facility];  Service: Cardiovascular;  Laterality: N/A;   ESOPHAGOGASTRODUODENOSCOPY (EGD) WITH PROPOFOL N/A 10/22/2020   Procedure: ESOPHAGOGASTRODUODENOSCOPY (EGD) WITH PROPOFOL;  Surgeon: Lucilla Lame, MD;  Location: [facility];  Service: Endoscopy;  Laterality: N/A;  Diabetic - oral meds   KNEE SURGERY Right    over 20 years ago   LEFT HEART CATH AND CORONARY ANGIOGRAPHY Left 02/22/2017   Procedure: Left Heart Cath and Coronary Angiography;  Surgeon: Corey Skains, MD;  Location: [facility];  Service: Cardiovascular;  Laterality: Left;   LEFT HEART CATH AND CORS/GRAFTS ANGIOGRAPHY N/A 06/18/2017   Procedure: Left Heart Cath and Cors/Grafts Angiography and PCI;  Surgeon: Isaias Cowman, MD;  Location: [facility];  Service: Cardiovascular;  Laterality: N/A;   POLYPECTOMY  10/25/2015   Procedure: POLYPECTOMY;  Surgeon: Lucilla Lame, MD;  Location: [facility];  Service: Endoscopy;;   ROTATOR CUFF REPAIR Right 2012   TEE WITHOUT CARDIOVERSION N/A 04/10/2017   Procedure: TRANSESOPHAGEAL ECHOCARDIOGRAM (TEE);  Surgeon: Tharon Aquas  Kerby Less, MD;  Location: [facility];  Service: Open Heart Surgery;  Laterality: N/A;    FHx:  Family History  Problem Relation Age of Onset   Cancer Father    Heart disease Father    Heart attack Father    Cancer Mother    Heart disease Brother    Diabetes Brother    Prostate cancer Brother    Diabetes Sister    Bladder Cancer Neg Hx    Kidney disease Neg Hx     Social History:  reports that he quit smoking about 43 years ago. His smoking use included cigarettes. He has a 30.00  pack-year smoking history. He has never used smokeless tobacco. He reports that he does not drink alcohol and does not use drugs.  Allergies:  Allergies  Allergen Reactions   Blood-Group Specific Substance Other (See Comments)    Patient received almost 2 units FFP during a procedure. Became hypotensive and developed rash   Cortisone Acetate [Cortisone] Rash and Other (See Comments)    Medications Prior to Admission  Medication Sig Dispense Refill   acetaminophen (TYLENOL) 500 MG tablet Take 2 tablets (1,000 mg total) by mouth every 6 (six) hours as needed. 30 tablet 0   amLODipine-benazepril (LOTREL) 5-20 MG capsule Take 1 capsule by mouth daily.     aspirin 81 MG tablet Take 1 tablet (81 mg total) by mouth daily. 90 tablet 1   atorvastatin (LIPITOR) 80 MG tablet TAKE 1 TABLET AT BEDTIME 90 tablet 3   carvedilol (COREG) 12.5 MG tablet TAKE 1 TABLET TWICE DAILY WITH A MEAL 180 tablet 1   clopidogrel (PLAVIX) 75 MG tablet Take 1 tablet (75 mg total) by mouth daily. 90 tablet 1   docusate sodium (COLACE) 100 MG capsule Take 100 mg by mouth 2 (two) times daily as needed.      FEROSUL 325 (65 Fe) MG tablet TAKE 1 TABLET EVERY DAY 90 tablet 0   furosemide (LASIX) 20 MG tablet TAKE 1 TABLET EVERY DAY 90 tablet 3   gabapentin (NEURONTIN) 800 MG tablet Take 1,600 mg by mouth 3 (three) times daily.     meclizine (ANTIVERT) 25 MG tablet TAKE 1 TABLET TWICE DAILY AS NEEDED FOR DIZZINESS 180 tablet 3   metFORMIN (GLUCOPHAGE-XR) 500 MG 24 hr tablet Take 500 mg by mouth 2 (two) times daily.      NARCAN 4 MG/0.1ML LIQD nasal spray kit 1 spray once.     nitroGLYCERIN (NITROSTAT) 0.4 MG SL tablet Place 1 tablet (0.4 mg total) under the tongue every 5 (five) minutes as needed for chest pain. 25 tablet 3   oxyCODONE-acetaminophen (PERCOCET) 10-325 MG tablet Take 1 tablet by mouth every 4 (four) hours as needed for pain. 40 tablet 0   pantoprazole (PROTONIX) 40 MG tablet TAKE 1 TABLET EVERY DAY 90 tablet 3    valACYclovir (VALTREX) 1000 MG tablet Take 1,000 mg by mouth as needed.     vitamin B-12 (CYANOCOBALAMIN) 1000 MCG tablet      VITAMIN D-1000 MAX ST 25 MCG (1000 UT) tablet Take 1,000 Units by mouth daily.     ACCU-CHEK AVIVA PLUS test strip  (Patient not taking: Reported on 05/10/2022)     Accu-Chek Softclix Lancets lancets  (Patient not taking: Reported on 05/10/2022)     albuterol (PROVENTIL HFA;VENTOLIN HFA) 108 (90 Base) MCG/ACT inhaler Inhale into the lungs. (Patient not taking: Reported on 03/20/2023)     Alcohol Swabs (B-D SINGLE USE SWABS REGULAR) PADS  fluticasone (FLONASE) 50 MCG/ACT nasal spray Place into the nose. (Patient not taking: Reported on 03/20/2023)      Physical Exam: General: Alert and oriented.  No apparent distress.  Vascular: DP/PT pulses diminished bilaterally but palpable, no hair growth noted to digits or feet bilaterally.  Mild nonpitting edema present to bilateral lower extremities.  Increased swelling and slight erythema present to the right lateral foot near the fifth metatarsal phalangeal joint area.  Capillary fill time appears to be intact to the right fifth toe.  Neuro: Light touch sensation absent to bilateral lower extremities.  Derm: Large ulceration present to the plantar lateral aspect right fifth metatarsal phalange joint with obvious necrotic changes consistent with dry gangrene.  Patient appears to have a central opening that probes to bone.  Appears to have fibronecrotic wound base down to the level of capsule and very close to bone at the fifth metatarsal phalangeal joint.  Mild odor, mild to moderate edema, mild erythema present to the area.  Worsening necrosis present today compared to previous imaging.   MSK: 5/5 strength bilateral lower extremity muscle groups.  Results for orders placed or performed during the hospital encounter of 03/20/23 (from the past 48 hour(s))  CBC     Status: Abnormal   Collection Time: 03/20/23 10:34 AM  Result  Value Ref Range   WBC 14.1 (H) 4.0 - 10.5 K/uL   RBC 3.60 (L) 4.22 - 5.81 MIL/uL   Hemoglobin 10.5 (L) 13.0 - 17.0 g/dL   HCT 31.6 (L) 39.0 - 52.0 %   MCV 87.8 80.0 - 100.0 fL   MCH 29.2 26.0 - 34.0 pg   MCHC 33.2 30.0 - 36.0 g/dL   RDW 14.7 11.5 - 15.5 %   Platelets 130 (L) 150 - 400 K/uL   nRBC 0.0 0.0 - 0.2 %    Comment: Performed at [facility], [address] [place], [place] [address]  Comprehensive metabolic panel     Status: Abnormal   Collection Time: 03/20/23 10:34 AM  Result Value Ref Range   Sodium 131 (L) 135 - 145 mmol/L   Potassium 4.1 3.5 - 5.1 mmol/L   Chloride 101 98 - 111 mmol/L   CO2 21 (L) 22 - 32 mmol/L   Glucose, Bld 159 (H) 70 - 99 mg/dL    Comment: Glucose reference range applies only to samples taken after fasting for at least 8 hours.   BUN 34 (H) 8 - 23 mg/dL   Creatinine, Ser 2.38 (H) 0.61 - 1.24 mg/dL   Calcium 7.8 (L) 8.9 - 10.3 mg/dL   Total Protein 6.6 6.5 - 8.1 g/dL   Albumin 3.4 (L) 3.5 - 5.0 g/dL   AST 17 15 - 41 U/L   ALT 17 0 - 44 U/L   Alkaline Phosphatase 57 38 - 126 U/L   Total Bilirubin 1.5 (H) 0.3 - 1.2 mg/dL   GFR, Estimated 28 (L) >60 mL/min    Comment: (NOTE) Calculated using the CKD-EPI Creatinine Equation (2021)    Anion gap 9 5 - 15    Comment: Performed at [facility], [address] [place], [place] [address]  Lactic acid, plasma     Status: None   Collection Time: 03/20/23 11:59 AM  Result Value Ref Range   Lactic Acid, Venous 1.2 0.5 - 1.9 mmol/L    Comment: Performed at [facility], [address] [place], [place] [address]  Sodium, urine, random     Status: None  Collection Time: 03/20/23  4:30 PM  Result Value Ref Range   Sodium, Ur 25 mmol/L    Comment: Performed at [facility], [address] [place], [place] [address]  Creatinine, urine, random     Status: None   Collection Time: 03/20/23  4:30 PM  Result Value Ref Range   Creatinine, Urine 105 mg/dL     Comment: Performed at [facility], [address] [place], [place] [address]  Glucose, capillary     Status: Abnormal   Collection Time: 03/20/23  5:57 PM  Result Value Ref Range   Glucose-Capillary 104 (H) 70 - 99 mg/dL    Comment: Glucose reference range applies only to samples taken after fasting for at least 8 hours.  Sedimentation rate     Status: Abnormal   Collection Time: 03/20/23  7:26 PM  Result Value Ref Range   Sed Rate 60 (H) 0 - 20 mm/hr    Comment: Performed at [facility], [address] [place], [place], [place] [address]  C-reactive protein     Status: Abnormal   Collection Time: 03/20/23  7:26 PM  Result Value Ref Range   CRP 16.8 (H) <1.0 mg/dL    Comment: Performed at [facility], [place] [address] [place], [place] [address]  Prealbumin     Status: Abnormal   Collection Time: 03/20/23  7:26 PM  Result Value Ref Range   Prealbumin 13 (L) 18 - 38 mg/dL    Comment: Performed at [facility] [address], [place], [place] [address]  Glucose, capillary     Status: Abnormal   Collection Time: 03/20/23  9:39 PM  Result Value Ref Range   Glucose-Capillary 175 (H) 70 - 99 mg/dL    Comment: Glucose reference range applies only to samples taken after fasting for at least 8 hours.  C-reactive protein     Status: Abnormal   Collection Time: 03/21/23  5:46 AM  Result Value Ref Range   CRP 14.5 (H) <1.0 mg/dL    Comment: Performed at [facility] [address] [place], [place] [address]  CBC     Status: Abnormal   Collection Time: 03/21/23  5:46 AM  Result Value Ref Range   WBC 7.8 4.0 - 10.5 K/uL   RBC 3.42 (L) 4.22 - 5.81 MIL/uL   Hemoglobin 9.9 (L) 13.0 - 17.0 g/dL   HCT 29.9 (L) 39.0 - 52.0 %   MCV 87.4 80.0 - 100.0 fL   MCH 28.9 26.0 - 34.0 pg   MCHC 33.1 30.0 - 36.0 g/dL   RDW 14.7 11.5 - 15.5 %   Platelets 140 (L) 150 - 400 K/uL   nRBC 0.0 0.0 - 0.2 %    Comment: Performed at [facility], [address]  [place],  [address]  Comprehensive metabolic panel     Status: Abnormal   Collection Time: 03/21/23  5:46 AM  Result Value Ref Range   Sodium 135 135 - 145 mmol/L   Potassium 4.4 3.5 - 5.1 mmol/L   Chloride 109 98 - 111 mmol/L   CO2 20 (L) 22 - 32 mmol/L   Glucose, Bld 122 (H) 70 - 99 mg/dL    Comment: Glucose reference range applies only to samples taken after fasting for at least 8 hours.   BUN 29 (H) 8 - 23 mg/dL   Creatinine, Ser 1.67 (H) 0.61 - 1.24 mg/dL   Calcium 7.5 (L) 8.9 - 10.3  mg/dL   Total Protein 6.2 (L) 6.5 - 8.1 g/dL   Albumin 3.0 (L) 3.5 - 5.0 g/dL   AST 13 (L) 15 - 41 U/L   ALT 15 0 - 44 U/L   Alkaline Phosphatase 51 38 - 126 U/L   Total Bilirubin 0.9 0.3 - 1.2 mg/dL   GFR, Estimated 43 (L) >60 mL/min    Comment: (NOTE) Calculated using the CKD-EPI Creatinine Equation (2021)    Anion gap 6 5 - 15    Comment: Performed at [facility], [address] [place], [place] [address]  Glucose, capillary     Status: Abnormal   Collection Time: 03/21/23  9:35 AM  Result Value Ref Range   Glucose-Capillary 149 (H) 70 - 99 mg/dL    Comment: Glucose reference range applies only to samples taken after fasting for at least 8 hours.  Glucose, capillary     Status: Abnormal   Collection Time: 03/21/23 11:45 AM  Result Value Ref Range   Glucose-Capillary 133 (H) 70 - 99 mg/dL    Comment: Glucose reference range applies only to samples taken after fasting for at least 8 hours.   MR FOOT RIGHT WO CONTRAST  Result Date: 03/20/2023 CLINICAL DATA:  Lateral plantar foot ulcer. EXAM: MRI OF THE RIGHT FOREFOOT WITHOUT CONTRAST TECHNIQUE: Multiplanar, multisequence MR imaging of the right forefoot was performed. No intravenous contrast was administered. COMPARISON:  Right foot x-rays from same day. FINDINGS: Bones/Joint/Cartilage No marrow signal abnormality. No fracture or dislocation. Minimal degenerative changes of the first MTP joint. Small fifth MTP joint effusion.  Ligaments Collateral ligaments are intact. Muscles and Tendons Flexor and extensor tendons are intact. No tenosynovitis. Complete fatty atrophy of the of the intrinsic foot muscles. Soft tissue Ulceration along the plantar aspect of the fifth metatarsal head and fifth MTP joint with underlying 2.0 x 0.6 x 2.5 cm fluid collection (series 11, image 26; series 9, image 18). Ulceration extends to the fifth MTP joint capsule. Prominent dorsal foot soft tissue swelling. No soft tissue mass. IMPRESSION: 1. Deep ulceration along the plantar aspect of the fifth metatarsal head and fifth MTP joint with underlying 2.5 cm abscess. 2. Ulceration extends to the fifth MTP joint capsule and there is an underlying small joint effusion, which may be reactive, although early septic arthritis is not excluded. No evidence of osteomyelitis. Electronically Signed   By: Titus Dubin M.D.   On: 03/20/2023 15:31   DG Foot Complete Right  Result Date: 03/20/2023 CLINICAL DATA:  Lateral plantar foot wound. EXAM: RIGHT FOOT COMPLETE - 3+ VIEW COMPARISON:  None Available. FINDINGS: Superficial plantar soft tissue ulceration at the level of the fifth MTP joint with associated soft tissue swelling. No underlying bony destruction or periosteal reaction. No acute fracture or dislocation. Minimal degenerative changes of the first MTP joint. Bone mineralization is normal. IMPRESSION: 1. Superficial plantar soft tissue ulceration at the level of the fifth MTP joint. No radiographic evidence of osteomyelitis. Electronically Signed   By: Titus Dubin M.D.   On: 03/20/2023 13:51    Blood pressure (!) 142/62, pulse 86, temperature 97.9 F (36.6 C), resp. rate 20, height 5\' 11"  (1.803 m), weight 85.8 kg, SpO2 98 %.  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 -X-ray imaging and MRI imaging reviewed and discussed  with patient detail showing large abscess present to the fifth metatarsal phalangeal joint area with  possible septic joint. -Patient's necrosis appears to be worsening today to the fifth metatarsal phalange joint and now appears to have dry gangrene present to the periphery of the wound with wound bed appearing fibrogranular, appears to have worsening necrosis of tissues. -Appreciate vascular recommendations.  They are unable to get him in for potential revascularization procedure until Friday. -All treatment options were discussed with the patient of both conservative and surgical attempts at correction include potential risks and complications at this time patient is elected for surgical intervention consisting of right partial fifth ray amputation versus incision and drainage with possible application of antibiotic beads.  Patient may also potentially need rotational skin flap closure based on severity of gangrenous changes to the tissues.  Discussed with patient will likely perform procedure without tourniquet to see how much he bleeds.  This may give [place] an indication of how good his circulation is to the area.  If patient does end up failing to the ill procedure, may subsequently need further surgery in the future consisting of either debridement and graft applications versus further amputation.  No guarantees given to patient.  Consent obtained.  Orders placed in chart. -Applied Betadine wet-to-dry dressing.  Patient may be partial weightbearing with heel contact and surgical shoe. -Appreciate medicine recommendations for antibiotic therapy. -Patient n.p.o. at midnight for surgical intervention tomorrow at [place], DPM 03/21/2023, 1:02 PM

## 2023-03-21 NOTE — Progress Notes (Signed)
Initial Nutrition Assessment  DOCUMENTATION CODES:   Not applicable  INTERVENTION:   -MVI with minerals daily -Magic cup BID with meals, each supplement provides 290 kcal and 9 grams of protein  -500 mg vitamin C BID -220 mg zinc sulfate daily x 14 days -Liberalize diet to carb modified for wider variety of meal selections  NUTRITION DIAGNOSIS:   Increased nutrient needs related to post-op healing as evidenced by estimated needs.  GOAL:   Patient will meet greater than or equal to 90% of their needs  MONITOR:   PO intake, Supplement acceptance  REASON FOR ASSESSMENT:   Consult Assessment of nutrition requirement/status  ASSESSMENT:   Pt with medical history significant of CHF, coronary artery disease, type 2 diabetes, GERD, gout, hyperlipidemia, hypertension presenting with diabetic foot infection.  Pt admitted with rt DM foot infection. MRI revealed deep ulceration along the planter aspect of fifth metatarsal head and fifth MTP joint.   4/4- s/p partial fifth ray amputation and rotational skin slap closure  Reviewed I/O's: -566 ml x 24 hours  UOP: 1.7 L x 24 hours   Per MD notes, vascular surgery to perform revascularization on 03/23/23.   Spoke with pt at bedside. Pt pleasant at time of visit and reports feeling hungry. He just consumed a cup of ice cream prior to RD visit. Pt shares he has a great appetite PTA and generally consumes 2 meals per day (Breakfast: scrambled eggs and avocado and Dinner: meat, starch, and vegetables). Noted meal completions 100%  Pt denies any weight loss. Wt has been stable over the past 11 months. Pt shares he usually gains weight in the winter and loses weight in the summer. 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 During the winter, he "does nothing" as business is slow.   DIscussed importance of good meal and supplement intake to promote healing. Pt reports very good DM control as Hgb A1c is  always less than 6. Pt still takes his metformin, however, MD is concerning discontinuing it. Discussed role of good blood sugar control due to wound healing, as well as foot care (pt has neuropathy and checks his feet daily). Pt denies taking vitamins and supplements PTA, but is amenable to do so here.   Medications reviewed and include miralax.    Lab Results  Component Value Date   HGBA1C 7.0 (H) 03/20/2023   PTA DM medications are 500 mg metformin BID.   Labs reviewed: CBGS:  (inpatient orders for glycemic control are 0-15 units insulin aspart TID with meals, 0-5 units insulin aspart daily at bedtime, and 4 units insulin aspart TID with meals).    NUTRITION - FOCUSED PHYSICAL EXAM:  Flowsheet Row Most Recent Value  Orbital Region No depletion  Upper Arm Region Mild depletion  Thoracic and Lumbar Region No depletion  Buccal Region No depletion  Temple Region Mild depletion  Clavicle Bone Region No depletion  Clavicle and Acromion Bone Region No depletion  Scapular Bone Region No depletion  Dorsal Hand No depletion  Patellar Region No depletion  Anterior Thigh Region No depletion  Posterior Calf Region No depletion  Edema (RD Assessment) None  Hair Reviewed  Eyes Reviewed  Mouth Reviewed  Skin Reviewed  Nails Reviewed       Diet Order:   Diet Order             Diet Carb Modified Fluid consistency: Thin  Diet effective now  EDUCATION NEEDS:   Education needs have been addressed  Skin:  Skin Assessment: Skin Integrity Issues: Skin Integrity Issues:: Incisions Diabetic Ulcer: rt foot Incisions: rt foot  Last BM:  03/18/23  Height:   Ht Readings from Last 1 Encounters:  03/22/23 5\' 11"  (1.803 m)    Weight:   Wt Readings from Last 1 Encounters:  03/22/23 90.7 kg    Ideal Body Weight:  78.2 kg  BMI:  Body mass index is 27.89 kg/m.  Estimated Nutritional Needs:   Kcal:  2150-2350  Protein:  115-130 grams  Fluid:  > 2  L    Loistine Chance, RD, LDN, [place] Registered Dietitian II Certified Diabetes Care and Education Specialist Please refer to [facility] for RD and/or RD on-call/weekend/after hours pager

## 2023-03-22 ENCOUNTER — Inpatient Hospital Stay: Payer: Medicare HMO | Admitting: Certified Registered Nurse Anesthetist

## 2023-03-22 ENCOUNTER — Encounter: Payer: Self-pay | Admitting: Podiatry

## 2023-03-22 ENCOUNTER — Encounter
Admission: EM | Disposition: A | Discharge status: Home / Self Care | Payer: Self-pay | Source: Ambulatory Visit | Attending: Internal Medicine

## 2023-03-22 ENCOUNTER — Other Ambulatory Visit: Payer: Self-pay

## 2023-03-22 ENCOUNTER — Inpatient Hospital Stay: Payer: Medicare HMO

## 2023-03-22 DIAGNOSIS — L089 Local infection of the skin and subcutaneous tissue, unspecified: Secondary | ICD-10-CM | POA: Diagnosis not present

## 2023-03-22 DIAGNOSIS — D696 Thrombocytopenia, unspecified: Secondary | ICD-10-CM | POA: Diagnosis not present

## 2023-03-22 DIAGNOSIS — E11628 Type 2 diabetes mellitus with other skin complications: Secondary | ICD-10-CM | POA: Diagnosis not present

## 2023-03-22 DIAGNOSIS — N179 Acute kidney failure, unspecified: Secondary | ICD-10-CM | POA: Diagnosis not present

## 2023-03-22 HISTORY — PX: IRRIGATION AND DEBRIDEMENT FOOT: SHX6602

## 2023-03-22 HISTORY — PX: EXCISION PARTIAL PHALANX: SHX6617

## 2023-03-22 LAB — GLUCOSE, CAPILLARY
Glucose-Capillary: 150 mg/dL — ABNORMAL HIGH (ref 70–99)
Glucose-Capillary: 149 mg/dL — ABNORMAL HIGH (ref 70–99)
Glucose-Capillary: 195 mg/dL — ABNORMAL HIGH (ref 70–99)
Glucose-Capillary: 103 mg/dL — ABNORMAL HIGH (ref 70–99)
Glucose-Capillary: 130 mg/dL — ABNORMAL HIGH (ref 70–99)

## 2023-03-22 LAB — BASIC METABOLIC PANEL
Anion gap: 7 (ref 5–15)
BUN: 14 mg/dL (ref 8–23)
CO2: 21 mmol/L — ABNORMAL LOW (ref 22–32)
Calcium: 8.6 mg/dL — ABNORMAL LOW (ref 8.9–10.3)
Chloride: 113 mmol/L — ABNORMAL HIGH (ref 98–111)
Creatinine, Ser: 1.03 mg/dL (ref 0.61–1.24)
GFR, Estimated: >60 mL/min (ref >60)
Glucose, Bld: 168 mg/dL — ABNORMAL HIGH (ref 70–99)
Potassium: 4.3 mmol/L (ref 3.5–5.1)
Sodium: 141 mmol/L (ref 135–145)

## 2023-03-22 LAB — CBC
HCT: 31.6 % — ABNORMAL LOW (ref 39.0–52.0)
Hemoglobin: 10.6 g/dL — ABNORMAL LOW (ref 13.0–17.0)
MCH: 29 pg (ref 26.0–34.0)
MCHC: 33.5 g/dL (ref 30.0–36.0)
MCV: 86.6 fL (ref 80.0–100.0)
Platelets: 174 10*3/uL (ref 150–400)
RBC: 3.65 MIL/uL — ABNORMAL LOW (ref 4.22–5.81)
RDW: 14.6 % (ref 11.5–15.5)
WBC: 5.9 10*3/uL (ref 4.0–10.5)
nRBC: 0 % (ref 0.0–0.2)

## 2023-03-22 LAB — UREA NITROGEN, URINE: Urea Nitrogen, Ur: 354 mg/dL

## 2023-03-22 SURGERY — IRRIGATION AND DEBRIDEMENT FOOT
Anesthesia: General | Site: Foot | Laterality: Right

## 2023-03-22 MED ORDER — ADULT MULTIVITAMIN W/MINERALS CH
1.0000 | ORAL_TABLET | Freq: Every day | ORAL | Status: DC
  Administered 2023-03-22 – 2023-03-23 (×2): 1 via ORAL
  Filled 2023-03-22 (×2): qty 1

## 2023-03-22 MED ORDER — ACETAMINOPHEN 10 MG/ML IV SOLN: 1000.0000 mg | Freq: Once | INTRAVENOUS | Status: DC | PRN

## 2023-03-22 MED ORDER — BUPIVACAINE HCL (PF) 0.5 % IJ SOLN
INTRAMUSCULAR | Status: AC
  Filled 2023-03-22: qty 30

## 2023-03-22 MED ORDER — MIDAZOLAM HCL 2 MG/2ML IJ SOLN
INTRAMUSCULAR | Status: AC
  Filled 2023-03-22: qty 2

## 2023-03-22 MED ORDER — ZINC SULFATE 220 (50 ZN) MG PO CAPS
220.0000 mg | ORAL_CAPSULE | Freq: Every day | ORAL | Status: DC
  Administered 2023-03-22 – 2023-03-23 (×2): 220 mg via ORAL
  Filled 2023-03-22 (×2): qty 1

## 2023-03-22 MED ORDER — PROPOFOL 10 MG/ML IV BOLUS
INTRAVENOUS | Status: DC | PRN
  Administered 2023-03-22: 50 ug/kg/min via INTRAVENOUS
  Administered 2023-03-22: 30 mg via INTRAVENOUS
  Administered 2023-03-22: 20 mg via INTRAVENOUS

## 2023-03-22 MED ORDER — OXYCODONE HCL 5 MG/5ML PO SOLN: 5.0000 mg | Freq: Once | ORAL | Status: DC | PRN

## 2023-03-22 MED ORDER — FENTANYL CITRATE (PF) 100 MCG/2ML IJ SOLN
INTRAMUSCULAR | Status: AC
  Filled 2023-03-22: qty 2

## 2023-03-22 MED ORDER — OXYCODONE HCL 5 MG PO TABS: 5.0000 mg | ORAL_TABLET | Freq: Once | ORAL | Status: DC | PRN

## 2023-03-22 MED ORDER — PROPOFOL 10 MG/ML IV BOLUS
INTRAVENOUS | Status: AC
  Filled 2023-03-22: qty 20

## 2023-03-22 MED ORDER — BUPIVACAINE HCL (PF) 0.5 % IJ SOLN
INTRAMUSCULAR | Status: DC | PRN
  Administered 2023-03-22: 20 mL

## 2023-03-22 MED ORDER — DEXMEDETOMIDINE HCL IN NACL 200 MCG/50ML IV SOLN
INTRAVENOUS | Status: DC | PRN
  Administered 2023-03-22: 8 ug via INTRAVENOUS
  Administered 2023-03-22: 4 ug via INTRAVENOUS

## 2023-03-22 MED ORDER — HEMOSTATIC AGENTS (NO CHARGE) OPTIME
TOPICAL | Status: DC | PRN
  Administered 2023-03-22: 1 via TOPICAL

## 2023-03-22 MED ORDER — LIDOCAINE HCL (CARDIAC) PF 100 MG/5ML IV SOSY
PREFILLED_SYRINGE | INTRAVENOUS | Status: DC | PRN
  Administered 2023-03-22: 60 mg via INTRAVENOUS

## 2023-03-22 MED ORDER — LACTATED RINGERS IV SOLN: INTRAVENOUS | Status: DC

## 2023-03-22 MED ORDER — VANCOMYCIN HCL IN DEXTROSE 1-5 GM/200ML-% IV SOLN
1000.0000 mg | Freq: Two times a day (BID) | INTRAVENOUS | Status: DC
  Administered 2023-03-22 – 2023-03-23 (×2): 1000 mg via INTRAVENOUS
  Filled 2023-03-22 (×3): qty 200

## 2023-03-22 MED ORDER — ONDANSETRON HCL 4 MG/2ML IJ SOLN: 4.0000 mg | Freq: Once | INTRAMUSCULAR | Status: DC | PRN

## 2023-03-22 MED ORDER — PHENYLEPHRINE 80 MCG/ML (10ML) SYRINGE FOR IV PUSH (FOR BLOOD PRESSURE SUPPORT)
PREFILLED_SYRINGE | INTRAVENOUS | Status: DC | PRN
  Administered 2023-03-22: 80 ug via INTRAVENOUS
  Administered 2023-03-22: 160 ug via INTRAVENOUS
  Administered 2023-03-22: 80 ug via INTRAVENOUS
  Administered 2023-03-22: 160 ug via INTRAVENOUS

## 2023-03-22 MED ORDER — CHLORHEXIDINE GLUCONATE 0.12 % MT SOLN
OROMUCOSAL | Status: AC
  Filled 2023-03-22: qty 15

## 2023-03-22 MED ORDER — FENTANYL CITRATE (PF) 100 MCG/2ML IJ SOLN: 25.0000 ug | INTRAMUSCULAR | Status: DC | PRN

## 2023-03-22 MED ORDER — ENOXAPARIN SODIUM 40 MG/0.4ML IJ SOSY
40.0000 mg | PREFILLED_SYRINGE | INTRAMUSCULAR | Status: DC
  Administered 2023-03-22: 40 mg via SUBCUTANEOUS
  Filled 2023-03-22: qty 0.4

## 2023-03-22 MED ORDER — VITAMIN C 500 MG PO TABS
500.0000 mg | ORAL_TABLET | Freq: Two times a day (BID) | ORAL | Status: DC
  Administered 2023-03-22 – 2023-03-23 (×3): 500 mg via ORAL
  Filled 2023-03-22 (×3): qty 1

## 2023-03-22 MED ORDER — LIDOCAINE HCL (PF) 2 % IJ SOLN
INTRAMUSCULAR | Status: AC
  Filled 2023-03-22: qty 5

## 2023-03-22 MED ORDER — 0.9 % SODIUM CHLORIDE (POUR BTL) OPTIME
TOPICAL | Status: DC | PRN
  Administered 2023-03-22: 1000 mL

## 2023-03-22 MED ORDER — ONDANSETRON HCL 4 MG/2ML IJ SOLN
INTRAMUSCULAR | Status: AC
  Filled 2023-03-22: qty 2

## 2023-03-22 MED ORDER — DEXMEDETOMIDINE HCL IN NACL 80 MCG/20ML IV SOLN
INTRAVENOUS | Status: AC
  Filled 2023-03-22: qty 20

## 2023-03-22 MED ORDER — LIDOCAINE HCL (PF) 1 % IJ SOLN
INTRAMUSCULAR | Status: AC
  Filled 2023-03-22: qty 30

## 2023-03-22 MED ORDER — CHLORHEXIDINE GLUCONATE 0.12 % MT SOLN
15.0000 mL | Freq: Once | OROMUCOSAL | Status: AC
  Administered 2023-03-22: 15 mL via OROMUCOSAL

## 2023-03-22 MED ORDER — FENTANYL CITRATE (PF) 100 MCG/2ML IJ SOLN
INTRAMUSCULAR | Status: DC | PRN
  Administered 2023-03-22: 25 ug via INTRAVENOUS

## 2023-03-22 SURGICAL SUPPLY — 59 items
BAG COUNTER SPONGE SURGICOUNT (BAG) ×2 IMPLANT
BAG SPNG CNTER NS LX DISP (BAG)
BLADE OSC/SAGITTAL MD 5.5X18 (BLADE) IMPLANT
BLADE OSCILLATING/SAGITTAL (BLADE) ×2
BLADE SW THK.38XMED LNG THN (BLADE) IMPLANT
BNDG CMPR 75X21 PLY HI ABS (MISCELLANEOUS)
BNDG CMPR STD VLCR NS LF 5.8X3 (GAUZE/BANDAGES/DRESSINGS)
BNDG CMPR STD VLCR NS LF 5.8X4 (GAUZE/BANDAGES/DRESSINGS)
BNDG ELASTIC 3X5.8 VLCR NS LF (GAUZE/BANDAGES/DRESSINGS) IMPLANT
BNDG ELASTIC 4X5.8 VLCR NS LF (GAUZE/BANDAGES/DRESSINGS) IMPLANT
BNDG ESMARCH 4 X 12 STRL LF (GAUZE/BANDAGES/DRESSINGS) ×2
BNDG ESMARCH 4X12 STRL LF (GAUZE/BANDAGES/DRESSINGS) ×2 IMPLANT
BNDG GAUZE DERMACEA FLUFF 4 (GAUZE/BANDAGES/DRESSINGS) IMPLANT
BNDG GZE 12X3 1 PLY HI ABS (GAUZE/BANDAGES/DRESSINGS)
BNDG GZE DERMACEA 4 6PLY (GAUZE/BANDAGES/DRESSINGS) ×2
BNDG STRETCH GAUZE 3IN X12FT (GAUZE/BANDAGES/DRESSINGS) IMPLANT
DURAPREP 26ML APPLICATOR (WOUND CARE) ×2 IMPLANT
ELECT REM PT RETURN 9FT ADLT (ELECTROSURGICAL) ×2
ELECTRODE REM PT RTRN 9FT ADLT (ELECTROSURGICAL) ×2 IMPLANT
GAUZE PACKING 0.25INX5YD STRL (GAUZE/BANDAGES/DRESSINGS) IMPLANT
GAUZE SPONGE 4X4 12PLY STRL (GAUZE/BANDAGES/DRESSINGS) ×2 IMPLANT
GAUZE STRETCH 2X75IN STRL (MISCELLANEOUS) IMPLANT
GAUZE XEROFORM 1X8 LF (GAUZE/BANDAGES/DRESSINGS) ×2 IMPLANT
GLOVE BIO SURGEON STRL SZ7 (GLOVE) ×2 IMPLANT
GLOVE INDICATOR 7.5 STRL GRN (GLOVE) ×2 IMPLANT
GOWN STRL REUS W/ TWL LRG LVL3 (GOWN DISPOSABLE) ×4 IMPLANT
GOWN STRL REUS W/TWL LRG LVL3 (GOWN DISPOSABLE) ×4
HEMOSTAT SURGICEL 2X3 (HEMOSTASIS) IMPLANT
IV NS 1000ML (IV SOLUTION)
IV NS 1000ML BAXH (IV SOLUTION) IMPLANT
IV NS IRRIG 3000ML ARTHROMATIC (IV SOLUTION) IMPLANT
KIT TURNOVER KIT A (KITS) ×2 IMPLANT
LABEL OR SOLS (LABEL) IMPLANT
MANIFOLD NEPTUNE II (INSTRUMENTS) ×2 IMPLANT
NDL HYPO 25X1 1.5 SAFETY (NEEDLE) ×4 IMPLANT
NEEDLE HYPO 25X1 1.5 SAFETY (NEEDLE) ×4 IMPLANT
NS IRRIG 1000ML POUR BTL (IV SOLUTION) IMPLANT
PACK EXTREMITY ARMC (MISCELLANEOUS) ×2 IMPLANT
PACKING GAUZE IODOFORM 1INX5YD (GAUZE/BANDAGES/DRESSINGS) IMPLANT
PAD ABD DERMACEA PRESS 5X9 (GAUZE/BANDAGES/DRESSINGS) IMPLANT
PULSAVAC PLUS IRRIG FAN TIP (DISPOSABLE)
RASP SM TEAR CROSS CUT (RASP) IMPLANT
SOL PREP PVP 2OZ (MISCELLANEOUS)
SOLUTION PREP PVP 2OZ (MISCELLANEOUS) IMPLANT
SPONGE T-LAP 18X18 ~~LOC~~+RFID (SPONGE) IMPLANT
STOCKINETTE STRL 6IN 960660 (GAUZE/BANDAGES/DRESSINGS) ×2 IMPLANT
SUT ETHILON 3-0 FS-10 30 BLK (SUTURE) ×4
SUT ETHILON 4-0 (SUTURE)
SUT ETHILON 4-0 FS2 18XMFL BLK (SUTURE)
SUT VIC AB 3-0 SH 27 (SUTURE) ×2
SUT VIC AB 3-0 SH 27X BRD (SUTURE) IMPLANT
SUT VIC AB 4-0 FS2 27 (SUTURE) IMPLANT
SUTURE EHLN 3-0 FS-10 30 BLK (SUTURE) IMPLANT
SUTURE ETHLN 4-0 FS2 18XMF BLK (SUTURE) IMPLANT
SWAB CULTURE AMIES ANAERIB BLU (MISCELLANEOUS) IMPLANT
SYR 10ML LL (SYRINGE) ×2 IMPLANT
TIP FAN IRRIG PULSAVAC PLUS (DISPOSABLE) IMPLANT
TRAP FLUID SMOKE EVACUATOR (MISCELLANEOUS) ×2 IMPLANT
WATER STERILE IRR 500ML POUR (IV SOLUTION) ×2 IMPLANT

## 2023-03-22 NOTE — Progress Notes (Signed)
Pharmacy Antibiotic Note  Kenneth Duarte is a 78 y.o. 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  Pharmacy has been consulted for Vancomycin dosing.  Scr improved from 1.67 to 1.03  Plan: Change vancomycin to 1000 mg IV every 12 hours Goal AUC 400-550 Estimated AUC 510.2, Cmin 15.4 Wt 90.7 kg, Scr 1.0., Vd coefficient 0.72 Vancomycin levels at steady state or as clinically indicated Ceftriaxone 2 grams IV every 24 hours ordered by provider Flagyl 500 mg IV every 12 hours ordered by provider   Height: 5\' 11"  (180.3 cm) Weight: 90.7 kg (200 lb) IBW/kg (Calculated) : 75.3  Temp (24hrs), Avg:97.9 F (36.6 C), Min:97.4 F (36.3 C), Max:98.5 F (36.9 C)  Recent Labs  Lab 03/20/23 1034 03/20/23 1159 03/21/23 0546 03/22/23 0607  WBC 14.1*  --  7.8 5.9  CREATININE 2.38*  --  1.67* 1.03  LATICACIDVEN  --  1.2  --   --      Estimated Creatinine Clearance: 72.5 mL/min (by C-G formula based on SCr of 1.03 mg/dL).    Allergies  Allergen Reactions   Blood-Group Specific Substance Other (See Comments)    Patient received almost 2 units FFP during a procedure. Became hypotensive and developed rash   Cortisone Acetate [Cortisone] Rash and Other (See Comments)    Antimicrobials this admission: 4/2 Vancomycin >>  4/2 CRO >>  4/2 Metronidazole >> 4/2 Zosyn x 1 dose in ED   Dose adjustments this admission: 4/3: Vancomycin 1750 mg q48h>>1250mg  q24h 4/4: Vancomycin 1250 q24h>>1g q12h  Microbiology results: N/A  Thank you for allowing pharmacy to be a part of this patient's care.  Lorin Picket, PharmD 03/22/2023 11:43 AM

## 2023-03-22 NOTE — Transfer of Care (Signed)
Immediate Anesthesia Transfer of Care Note  Patient: Kenneth Duarte  Procedure(s) Performed: IRRIGATION AND DEBRIDEMENT FOOT (Right: Foot) EXCISION  PHALANX (Right: Fifth Toe)  Patient Location: PACU  Anesthesia Type:General  Level of Consciousness: awake, alert , and oriented  Airway & Oxygen Therapy: Patient Spontanous Breathing  Post-op Assessment: Report given to RN and Post -op Vital signs reviewed and stable  Post vital signs: Reviewed and stable  Last Vitals:  Vitals Value Taken Time  BP    Temp    Pulse 68 03/22/23 0910  Resp 15 03/22/23 0910  SpO2 99 % 03/22/23 0910  Vitals shown include unvalidated device data.  Last Pain:  Vitals:   03/22/23 0735  TempSrc: Temporal  PainSc: 3          Complications: No notable events documented.

## 2023-03-22 NOTE — Progress Notes (Signed)
PROGRESS NOTE    [place]  [id number] DOB: Nov 06, 1946 DOA: 03/20/2023 PCP: Jaiel Coffin, MD  Assessment & Plan:   Active Problems:   Diabetic foot infection   Benign essential HTN   Diabetes   Dyslipidemia   GERD (gastroesophageal reflux disease)   PAD (peripheral artery disease)   COPD (chronic obstructive pulmonary disease)   CHF (congestive heart failure)   AKI (acute kidney injury)  Assessment and Plan: Diabetic right foot infection: w/ deep ulceration along the plantar aspect of the fifth metatarsal head and fifth MTP joint with underlying 2.5 cm abscess as per MRI. RLE angiogram on 03/23/23 as per vasc surg. 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 Wound cx growing rare gram positive cocci. Continue on IV rocephin, vanco. Podiatry & vasc surg following and recs apprec. PT/OT consulted    AKI: resolved   Likely chronic diastolic CHF: appears compensated. Continue on coreg. 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 Continue on bronchodilators   GERD: continue on PPI    HLD: continue on statin    Hx of CAD: s/p CABG & stenting. No chest pain currently. Continue on coreg, statin, aspirin, plavix    DM2: HbA1c 7.0, poorly controlled. Continue on SSI w/ accuchecks    HTN: continue on coreg   Thrombocytopenia: WNL today     DVT prophylaxis: lovenox  Code Status: full  Family Communication:  Disposition Plan: likely d/c back home   Level of care: Telemetry Medical  Status is: Inpatient Remains inpatient appropriate because: severity of illness, s/p surg today. PT/OT consulted. Wound cxs pending     Consultants:  Podiatry Vasc surg   Procedures:   Antimicrobials: rocephin, vanco   Subjective: Pt c/o foot pain    Objective: Vitals:   03/21/23 0950 03/21/23 1547 03/21/23 2322 03/22/23 0735  BP: (!) 142/62 (!) 149/63 (!) 124/47 (!) 173/72  Pulse: 86 73  70 74  Resp:  17 20 18   Temp:  98.5 F (36.9 C) 98 F (36.7 C) 97.9 F (36.6 C)  TempSrc:  Oral  Temporal  SpO2:  98% 96% 97%  Weight:    90.7 kg  Height:    5\' 11"  (1.803 m)    Intake/Output Summary (Last 24 hours) at 03/22/2023 0906 Last data filed at 03/22/2023 0858 Gross per 24 hour  Intake 458.38 ml  Output 2902 ml  Net -2443.62 ml   Filed Weights   03/20/23 1033 03/22/23 0735  Weight: 85.8 kg 90.7 kg    Examination:  General exam: Appears comfortable  Respiratory system: clear breath sounds b/l  Cardiovascular system: S1/S2+. No rubs or clicks   Gastrointestinal system: abd is soft, NT, ND & normal bowel sounds  Central nervous system: Alert and oriented. Moves all extremities  Psychiatry: judgement and insight appears normal. Appropriate mood and affect    Data Reviewed: I have personally reviewed following labs and imaging studies  CBC: Recent Labs  Lab 03/20/23 1034 03/21/23 0546 03/22/23 0607  WBC 14.1* 7.8 5.9  HGB 10.5* 9.9* 10.6*  HCT 31.6* 29.9* 31.6*  MCV 87.8 87.4 86.6  PLT 130* 140* [id number]   Basic Metabolic Panel: Recent Labs  Lab 03/20/23 1034 03/21/23 0546 03/22/23 0607  NA 131* 135 141  K 4.1 4.4 4.3  CL 101 109 113*  CO2 21* 20* 21*  GLUCOSE 159* 122* 168*  BUN 34* 29* 14  CREATININE 2.38* 1.67* 1.03  CALCIUM 7.8* 7.5* 8.6*   GFR: Estimated Creatinine Clearance: 72.5 mL/min (by C-G formula based on SCr of 1.03 mg/dL). Liver Function Tests: Recent Labs  Lab 03/20/23 1034 03/21/23 0546  AST 17 13*  ALT 17 15  ALKPHOS 57 51  BILITOT 1.5* 0.9  PROT 6.6 6.2*  ALBUMIN 3.4* 3.0*   No results for input(s): "LIPASE", "AMYLASE" in the last 168 hours. No results for input(s): "AMMONIA" in the last 168 hours. Coagulation Profile: No results for input(s): "INR", "PROTIME" in the last 168 hours. Cardiac Enzymes: No results for input(s): "CKTOTAL", "CKMB", "CKMBINDEX", "TROPONINI" in the last 168 hours. BNP (last 3 results) No  results for input(s): "PROBNP" in the last 8760 hours. HbA1C: Recent Labs    03/20/23 1034  HGBA1C 7.0*   CBG: Recent Labs  Lab 03/21/23 0935 03/21/23 1145 03/21/23 1652 03/21/23 2239 03/22/23 0731  GLUCAP 149* 133* 120* 94 150*   Lipid Profile: No results for input(s): "CHOL", "HDL", "LDLCALC", "TRIG", "CHOLHDL", "LDLDIRECT" in the last 72 hours. Thyroid Function Tests: No results for input(s): "TSH", "T4TOTAL", "FREET4", "T3FREE", "THYROIDAB" in the last 72 hours. Anemia Panel: No results for input(s): "VITAMINB12", "FOLATE", "FERRITIN", "TIBC", "IRON", "RETICCTPCT" in the last 72 hours. Sepsis Labs: Recent Labs  Lab 03/20/23 1159  LATICACIDVEN 1.2    Recent Results (from the past 240 hour(s))  Aerobic/Anaerobic Culture w Gram Stain (surgical/deep wound)     Status: None (Preliminary result)   Collection Time: 03/21/23  4:00 PM   Specimen: Wound  Result Value Ref Range Status   Specimen Description   Final    WOUND RIGHT FOOT Performed at [facility], [place] [address] [place], [place] [address]    Special Requests   Final    NONE Performed at [facility], [place], [place] [address]    Gram Stain NO WBC SEEN RARE GRAM POSITIVE COCCI   Final   Culture   Final    TOO YOUNG TO READ Performed at [facility], [place] [address], [place], [place] [address]    Report Status PENDING  Incomplete         Radiology Studies: MR FOOT RIGHT WO CONTRAST  Result Date: 03/20/2023 CLINICAL DATA:  Lateral plantar foot ulcer. EXAM: MRI OF THE RIGHT FOREFOOT WITHOUT CONTRAST TECHNIQUE: Multiplanar, multisequence MR imaging of the right forefoot was performed. No intravenous contrast was administered. COMPARISON:  Right foot x-rays from same day. FINDINGS: Bones/Joint/Cartilage No marrow signal abnormality. No fracture or dislocation. Minimal degenerative changes of the first MTP joint. Small fifth MTP joint effusion. Ligaments Collateral  ligaments are intact. Muscles and Tendons Flexor and extensor tendons are intact. No tenosynovitis. Complete fatty atrophy of the of the intrinsic foot muscles. Soft tissue Ulceration along the plantar aspect of the fifth metatarsal head and fifth MTP joint with underlying 2.0 x 0.6 x 2.5 cm fluid collection (series 11, image 26; series 9, image 18). Ulceration extends to the fifth MTP joint capsule. Prominent dorsal foot soft tissue swelling. No soft tissue mass. IMPRESSION: 1. Deep ulceration along the plantar aspect of the fifth metatarsal head and fifth MTP joint with underlying 2.5 cm abscess. 2. Ulceration extends to the fifth MTP joint capsule and there is an underlying small joint effusion, which may be reactive, although early septic arthritis is not excluded. No evidence of osteomyelitis. Electronically Signed   By: Titus Dubin M.D.   On: 03/20/2023 15:31   DG  Foot Complete Right  Result Date: 03/20/2023 CLINICAL DATA:  Lateral plantar foot wound. EXAM: RIGHT FOOT COMPLETE - 3+ VIEW COMPARISON:  None Available. FINDINGS: Superficial plantar soft tissue ulceration at the level of the fifth MTP joint with associated soft tissue swelling. No underlying bony destruction or periosteal reaction. No acute fracture or dislocation. Minimal degenerative changes of the first MTP joint. Bone mineralization is normal. IMPRESSION: 1. Superficial plantar soft tissue ulceration at the level of the fifth MTP joint. No radiographic evidence of osteomyelitis. Electronically Signed   By: Titus Dubin M.D.   On: 03/20/2023 13:51        Scheduled Meds:  [MAR Hold] aspirin EC  81 mg Oral Daily   [MAR Hold] atorvastatin  80 mg Oral QHS   [MAR Hold] carvedilol  12.5 mg Oral BID WC   [MAR Hold] clopidogrel  75 mg Oral Daily   [MAR Hold] enoxaparin (LOVENOX) injection  30 mg Subcutaneous Q24H   [MAR Hold] insulin aspart  0-15 Units Subcutaneous TID WC   [MAR Hold] insulin aspart  0-5 Units Subcutaneous QHS    [MAR Hold] insulin aspart  4 Units Subcutaneous TID WC   [MAR Hold] pantoprazole  40 mg Oral Daily   [MAR Hold] polyethylene glycol  17 g Oral Daily   Continuous Infusions:  sodium chloride 75 mL/hr at 03/22/23 0756   acetaminophen     [MAR Hold] cefTRIAXone (ROCEPHIN)  IV 2 g (03/21/23 2252)   lactated ringers     [MAR Hold] metronidazole 500 mg (03/21/23 2126)   [MAR Hold] vancomycin 1,250 mg (03/21/23 1555)     LOS: 2 days    Time spent: 30 mins     Wyvonnia Dusky, MD [facility] Pager [phone]  If 7PM-7AM, please contact night-coverage [email or web address] 03/22/2023, 9:06 AM

## 2023-03-22 NOTE — Anesthesia Postprocedure Evaluation (Signed)
Anesthesia Post Note  Patient: [place]  Procedure(s) Performed: IRRIGATION AND DEBRIDEMENT FOOT (Right: Foot) EXCISION  PHALANX (Right: Fifth Toe)  Patient location during evaluation: PACU Anesthesia Type: General Level of consciousness: awake and alert, oriented and patient cooperative Pain management: pain level controlled Vital Signs Assessment: post-procedure vital signs reviewed and stable Respiratory status: spontaneous breathing, nonlabored ventilation and respiratory function stable Cardiovascular status: blood pressure returned to baseline and stable Postop Assessment: adequate PO intake Anesthetic complications: no   No notable events documented.   Last Vitals:  Vitals:   03/22/23 0915 03/22/23 0930  BP: (!) 147/63 (!) 168/71  Pulse: 66 68  Resp: 13 16  Temp:  36.6 C  SpO2: 99% 99%    Last Pain:  Vitals:   03/22/23 0930  TempSrc:   PainSc: 0-No pain                 Darrin Nipper

## 2023-03-22 NOTE — Anesthesia Procedure Notes (Signed)
Procedure Name: MAC Date/Time: 03/22/2023 8:11 AM  Performed by: Tollie Eth, CRNAPre-anesthesia Checklist: Patient identified, Emergency Drugs available, Suction available and Patient being monitored Patient Re-evaluated:Patient Re-evaluated prior to induction Oxygen Delivery Method: Simple face mask Induction Type: IV induction Placement Confirmation: positive ETCO2

## 2023-03-22 NOTE — Op Note (Signed)
PODIATRY / FOOT AND ANKLE SURGERY OPERATIVE REPORT    SURGEON: Caroline More, DPM  PRE-OPERATIVE DIAGNOSIS:  1.  Right foot gangrene 2.  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  PVD 4.  Diabetes type 2 polyneuropathy  POST-OPERATIVE DIAGNOSIS: Same  PROCEDURE(S): Right partial fifth ray amputation Rotational skin flap closure right fifth metatarsal phalangeal joint  HEMOSTASIS: Right ankle tourniquet, 2 minutes  ANESTHESIA: MAC  ESTIMATED BLOOD LOSS: 100 cc  FINDING(S): 1.  Gangrene to the right fifth metatarsal phalange joint all the way down to bone  PATHOLOGY/SPECIMEN(S): Right partial fifth ray  INDICATIONS:   Kenneth [place] is a 77 y.o. male who presents with a gangrenous ulceration to the plantar aspect of the right fifth metatarsal phalangeal joint area.  Patient has been seen in outpatient clinic by Dr. Cleda Mccreedy for several months for a wound has been nonhealing.  Patient was seen earlier in the week and was noted to have worsening necrosis of the area so he was sent to the emergency room due to concerns for potential abscess and gangrene developing.  Patient had MRI which showed a potential abscess present to the fifth metatarsal phalange joint with no bony involvement.  Upon clinical examination there appears to be worsening gangrenous necrosis of the fifth metatarsal phalangeal joint down to the level of bone.  All treatment options were discussed with the patient both conservative and surgical attempts at correction include potential risks and complications at this time patient is elected for surgical intervention consisting of right partial fifth ray amputation with rotational skin flap closure and possible antibiotic bead application.  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  No guarantees given.  Consent  obtained prior to procedure..  DESCRIPTION: After obtaining full informed written consent, the patient was brought back to the operating room and placed supine upon the operating table.  The patient received IV antibiotics prior to induction.  20 cc of half percent Marcaine plain was injected about the right fifth ray.  After obtaining adequate anesthesia, the patient was prepped and draped in the standard fashion.  Attention was then directed to the area of gangrenous necrosis where the tissue was debrided.  Predebridement measurement of the wound was approximately 5 cm x 2.5 cm x 1.5 cm at the plantar lateral aspect of the fifth metatarsal phalange joint right foot.  This tissue was all removed.  Once this tissue was removed it also appeared to have bony involvement as there was no bleeding to this tissue down to the level of bone.  At this time it was determined to perform a right partial fifth ray amputation due to inherent tissue death at the area.  An incision was made all around the necrotic tissue excising the necrotic tissue with the arms the incision extending over the dorsal lateral aspect of the fifth metatarsal to the midshaft and reacting around the fifth toe.  This incision was made straight to bone.  A fair amount of bleeding occurred to the areas of the proximal aspect of the incision at the fifth metatarsal but very minimal bleeding occurred overall to the fifth toe in this area.  An extensor tenotomy and capsulotomy was performed followed by release the collateral and suspensory ligaments as well as any connection to the flexor tendon and plantar plate.  The fifth toe was disarticulated and passed  off the operative site.  Circumferential dissection was then performed around the fifth metatarsal head and neck area to the midshaft.  At this time utilizing a sagittal bone saw a osteotomy was performed through the fifth metatarsal at the midshaft level with the appropriate beveling.  The fifth  metatarsal was then dissected free and the capital fragment was resected and passed off the operative site and sent off to pathology along with the toe for further analysis.  Wound culture had already been taken while patient was in the hospital.  The surgical site was flushed with copious amounts normal sterile saline.  A large amount of bleeding was occurring to this area so at this time additionally finished used to exsanguinate the area and the pneumatic ankle tourniquet was inflated.  This did not appear to control hemostasis well and actually appeared to worsen the situation so the pneumatic ankle tourniquet was deflated soon after.  Further debridement was performed removing any nonviable necrotic tissue to the area to healthy bleeding tissue.  Hemostasis was achieved with electrocauterization usage of Surgicel.  The defect that was present in the area of the previous wound and gangrene measured approximately 5 cm x 3 cm x 2 cm.  The surgical site was flushed with copious amounts normal sterile saline.   At this time deep closure was then obtained with 3-0 Vicryl.  A rotational flap was then performed covering the defect advancing and rotating the flap 5 cm x 3 cm over the void to cover the entirety of the defect that was present.  This was performed with a 3-0 nylon in a combination of simple and horizontal mattress type stitching.  The remainder the incision was reapproximated well coapted with 3-0 nylon with simple type stitching.  Hemostasis appeared to be well achieved overall.  A postoperative dressing was then applied consisting of Xeroform followed by 4 x 4 gauze, ABD, Kerlix, Ace wrap.  The patient tolerated the procedure and anesthesia well and was transferred to the recovery room vital signs stable vascular status seeming to be intact to the right foot.  Following.  Postoperative monitoring the patient will be discharged back to the inpatient room with the appropriate orders and instructions.   Patient is to remain partial weightbearing with heel contact and surgical shoe.  PT and OT has been ordered to start tomorrow morning.  Patient to remain n.p.o. for potential vascular procedure later today.  If they are unable to perform this will likely be tomorrow at some point.  Will leave it up to vascular service on n.p.o. order for diet.  COMPLICATIONS: None  CONDITION: Good, stable  Caroline More, DPM

## 2023-03-22 NOTE — H&P (Signed)
HISTORY AND PHYSICAL INTERVAL NOTE:  03/22/2023  7:51 AM  Kenneth [place]  has presented today for surgery, with the diagnosis of right foot infection, gangrene, osteomyelitis, abscess, PVD, DM2 with polyneuropathy.  The various methods of treatment have been discussed with the patient.  No guarantees were given.  After consideration of risks, benefits and other options for treatment, the patient has consented to surgery.  I have reviewed the patients' chart and labs.    PROCEDURE: RIGHT FOOT INCISION AND DRAINAGE WITH REMOVAL OF ALL NONVIABLE AND NECROTIC TISSUE POSSIBLE PARTIAL RIGHT FIFTH RAY AMPUTATINO POSSIBLE ABX BEAD APPLICATION POSSIBLE WOUND VAC APPLICATION  A history and physical examination was performed in the hospital.  The patient was reexamined.  There have been no changes to this history and physical examination.  Caroline More, DPM

## 2023-03-22 NOTE — Anesthesia Preprocedure Evaluation (Addendum)
Anesthesia Evaluation  Patient identified by MRN, date of birth, ID band Patient awake    Reviewed: Allergy & Precautions, NPO status , Patient's Chart, lab work & pertinent test results  History of Anesthesia Complications Negative for: history of anesthetic complications  Airway Mallampati: III   Neck ROM: Full    Dental  (+) Missing   Pulmonary COPD, former smoker (quit 1980)   Pulmonary exam normal breath sounds clear to auscultation       Cardiovascular hypertension, + CAD (s/p MI, 3V CABG 2018, stents 2018 on Plavix), + Peripheral Vascular Disease and +CHF (diastolic)  Normal cardiovascular exam Rhythm:Regular Rate:Normal  ECG 02/10/22: NSR rate 66  BPM, no significant ST or T wave changes   Neuro/Psych Chronic pain; vertigo    GI/Hepatic ,GERD  ,,  Endo/Other  diabetes, Type 2    Renal/GU      Musculoskeletal  (+) Arthritis ,  Gout    Abdominal   Peds  Hematology  (+) Blood dyscrasia, anemia   Anesthesia Other Findings Cardiology note 05/10/22:  Coronary artery disease of bypass graft of native heart with stable angina pectoris ([place]) - CABG 2018 Currently with no symptoms of angina. No further workup at this time. Continue current medication regimen.   Benign essential HTN Blood pressure well controlled today, no changes made Recommend he try to stay hydrated For orthostasis symptoms recommend he call our office   Type 2 diabetes mellitus with other circulatory complication, without long-term current use of insulin ([place]) No recent lab work available,    Dyslipidemia Checked with PMD,  Goal LDL <70 No recent lab work available for review   S/P CABG x 3 -  Prior surgery 2018 Non-smoker, diabetes well controlled, cholesterol in the past well controlled Stable   Reproductive/Obstetrics                             Anesthesia Physical Anesthesia Plan  ASA: 3  Anesthesia  Plan: General   Post-op Pain Management:    Induction: Intravenous  PONV Risk Score and Plan: 2 and Ondansetron, Treatment may vary due to age or medical condition, Propofol infusion and TIVA  Airway Management Planned: Natural Airway  Additional Equipment:   Intra-op Plan:   Post-operative Plan:   Informed Consent: I have reviewed the patients History and Physical, chart, labs and discussed the procedure including the risks, benefits and alternatives for the proposed anesthesia with the patient or authorized representative who has indicated his/her understanding and acceptance.     Dental advisory given  Plan Discussed with: CRNA  Anesthesia Plan Comments: (LMA/GETA backup discussed.  Patient consented for risks of anesthesia including but not limited to:  - adverse reactions to medications - damage to eyes, teeth, lips or other oral mucosa - nerve damage due to positioning  - sore throat or hoarseness - damage to heart, brain, nerves, lungs, other parts of body or loss of life  Informed patient about role of CRNA in peri- and intra-operative care.  Patient voiced understanding.)        Anesthesia Quick Evaluation

## 2023-03-23 ENCOUNTER — Encounter: Payer: Self-pay | Admitting: Vascular Surgery

## 2023-03-23 ENCOUNTER — Encounter
Admission: EM | Disposition: A | Discharge status: Home / Self Care | Payer: Self-pay | Source: Ambulatory Visit | Attending: Internal Medicine

## 2023-03-23 DIAGNOSIS — I708 Atherosclerosis of other arteries: Secondary | ICD-10-CM

## 2023-03-23 DIAGNOSIS — I70235 Atherosclerosis of native arteries of right leg with ulceration of other part of foot: Secondary | ICD-10-CM | POA: Diagnosis not present

## 2023-03-23 DIAGNOSIS — L089 Local infection of the skin and subcutaneous tissue, unspecified: Secondary | ICD-10-CM | POA: Diagnosis not present

## 2023-03-23 DIAGNOSIS — E1169 Type 2 diabetes mellitus with other specified complication: Secondary | ICD-10-CM | POA: Diagnosis not present

## 2023-03-23 DIAGNOSIS — L97519 Non-pressure chronic ulcer of other part of right foot with unspecified severity: Secondary | ICD-10-CM | POA: Diagnosis not present

## 2023-03-23 DIAGNOSIS — M86071 Acute hematogenous osteomyelitis, right ankle and foot: Secondary | ICD-10-CM | POA: Diagnosis not present

## 2023-03-23 DIAGNOSIS — I10 Essential (primary) hypertension: Secondary | ICD-10-CM | POA: Diagnosis not present

## 2023-03-23 DIAGNOSIS — I701 Atherosclerosis of renal artery: Secondary | ICD-10-CM | POA: Diagnosis not present

## 2023-03-23 HISTORY — PX: LOWER EXTREMITY INTERVENTION: CATH118252

## 2023-03-23 HISTORY — PX: LOWER EXTREMITY ANGIOGRAPHY: CATH118251

## 2023-03-23 LAB — GLUCOSE, CAPILLARY
Glucose-Capillary: 138 mg/dL — ABNORMAL HIGH (ref 70–99)
Glucose-Capillary: 171 mg/dL — ABNORMAL HIGH (ref 70–99)

## 2023-03-23 LAB — BASIC METABOLIC PANEL
Anion gap: 7 (ref 5–15)
BUN: 11 mg/dL (ref 8–23)
CO2: 23 mmol/L (ref 22–32)
Calcium: 8.3 mg/dL — ABNORMAL LOW (ref 8.9–10.3)
Chloride: 110 mmol/L (ref 98–111)
Creatinine, Ser: 1.08 mg/dL (ref 0.61–1.24)
GFR, Estimated: >60 mL/min (ref >60)
Glucose, Bld: 181 mg/dL — ABNORMAL HIGH (ref 70–99)
Potassium: 4.2 mmol/L (ref 3.5–5.1)
Sodium: 140 mmol/L (ref 135–145)

## 2023-03-23 LAB — CBC
HCT: 30.1 % — ABNORMAL LOW (ref 39.0–52.0)
Hemoglobin: 9.9 g/dL — ABNORMAL LOW (ref 13.0–17.0)
MCH: 28.9 pg (ref 26.0–34.0)
MCHC: 32.9 g/dL (ref 30.0–36.0)
MCV: 87.8 fL (ref 80.0–100.0)
Platelets: 160 10*3/uL (ref 150–400)
RBC: 3.43 MIL/uL — ABNORMAL LOW (ref 4.22–5.81)
RDW: 14.4 % (ref 11.5–15.5)
WBC: 5.6 10*3/uL (ref 4.0–10.5)
nRBC: 0 % (ref 0.0–0.2)

## 2023-03-23 LAB — SURGICAL PATHOLOGY

## 2023-03-23 SURGERY — LOWER EXTREMITY ANGIOGRAPHY
Anesthesia: Moderate Sedation | Laterality: Right

## 2023-03-23 MED ORDER — METHYLPREDNISOLONE SODIUM SUCC 125 MG IJ SOLR: 125.0000 mg | Freq: Once | INTRAMUSCULAR | Status: DC | PRN

## 2023-03-23 MED ORDER — MIDAZOLAM HCL 5 MG/5ML IJ SOLN
INTRAMUSCULAR | Status: AC
  Filled 2023-03-23: qty 5

## 2023-03-23 MED ORDER — DOXYCYCLINE MONOHYDRATE 100 MG PO TABS: 100.0000 mg | ORAL_TABLET | Freq: Two times a day (BID) | ORAL | 0 refills | Status: AC

## 2023-03-23 MED ORDER — MIDAZOLAM HCL 2 MG/2ML IJ SOLN
INTRAMUSCULAR | Status: DC | PRN
  Administered 2023-03-23: 1 mg via INTRAVENOUS
  Administered 2023-03-23: 2 mg via INTRAVENOUS
  Administered 2023-03-23: 1 mg via INTRAVENOUS

## 2023-03-23 MED ORDER — CEFAZOLIN SODIUM-DEXTROSE 2-4 GM/100ML-% IV SOLN: 2.0000 g | INTRAVENOUS | Status: DC

## 2023-03-23 MED ORDER — HEPARIN SODIUM (PORCINE) 1000 UNIT/ML IJ SOLN
INTRAMUSCULAR | Status: AC
  Filled 2023-03-23: qty 10

## 2023-03-23 MED ORDER — HEPARIN SODIUM (PORCINE) 1000 UNIT/ML IJ SOLN
INTRAMUSCULAR | Status: DC | PRN
  Administered 2023-03-23: 5000 [IU] via INTRAVENOUS

## 2023-03-23 MED ORDER — CEFAZOLIN SODIUM-DEXTROSE 1-4 GM/50ML-% IV SOLN
INTRAVENOUS | Status: AC | PRN
  Administered 2023-03-23: 2 g via INTRAVENOUS

## 2023-03-23 MED ORDER — CHLORHEXIDINE GLUCONATE CLOTH 2 % EX PADS
6.0000 | MEDICATED_PAD | Freq: Once | CUTANEOUS | Status: DC
  Administered 2023-03-23: 6 via TOPICAL

## 2023-03-23 MED ORDER — IODIXANOL 320 MG/ML IV SOLN
INTRAVENOUS | Status: DC | PRN
  Administered 2023-03-23: 55 mL

## 2023-03-23 MED ORDER — SODIUM CHLORIDE 0.9 % IV SOLN: INTRAVENOUS | Status: DC

## 2023-03-23 MED ORDER — MIDAZOLAM HCL 2 MG/ML PO SYRP: 8.0000 mg | ORAL_SOLUTION | Freq: Once | ORAL | Status: DC | PRN

## 2023-03-23 MED ORDER — FAMOTIDINE 20 MG PO TABS: 40.0000 mg | ORAL_TABLET | Freq: Once | ORAL | Status: DC | PRN

## 2023-03-23 MED ORDER — CEFAZOLIN SODIUM-DEXTROSE 2-4 GM/100ML-% IV SOLN
INTRAVENOUS | Status: AC
  Filled 2023-03-23: qty 100

## 2023-03-23 MED ORDER — HYDROMORPHONE HCL 1 MG/ML IJ SOLN: 1.0000 mg | Freq: Once | INTRAMUSCULAR | Status: DC | PRN

## 2023-03-23 MED ORDER — OXYCODONE-ACETAMINOPHEN 10-325 MG PO TABS: 1.0000 | ORAL_TABLET | Freq: Four times a day (QID) | ORAL | 0 refills | Status: AC | PRN

## 2023-03-23 MED ORDER — FENTANYL CITRATE (PF) 100 MCG/2ML IJ SOLN
INTRAMUSCULAR | Status: AC
  Filled 2023-03-23: qty 2

## 2023-03-23 MED ORDER — DIPHENHYDRAMINE HCL 50 MG/ML IJ SOLN: 50.0000 mg | Freq: Once | INTRAMUSCULAR | Status: DC | PRN

## 2023-03-23 MED ORDER — FENTANYL CITRATE PF 50 MCG/ML IJ SOSY: 12.5000 ug | PREFILLED_SYRINGE | Freq: Once | INTRAMUSCULAR | Status: DC | PRN

## 2023-03-23 MED ORDER — ONDANSETRON HCL 4 MG/2ML IJ SOLN: 4.0000 mg | Freq: Four times a day (QID) | INTRAMUSCULAR | Status: DC | PRN

## 2023-03-23 MED ORDER — FENTANYL CITRATE (PF) 100 MCG/2ML IJ SOLN
INTRAMUSCULAR | Status: DC | PRN
  Administered 2023-03-23 (×2): 25 ug via INTRAVENOUS
  Administered 2023-03-23: 50 ug via INTRAVENOUS

## 2023-03-23 SURGICAL SUPPLY — 19 items
BALLN LUTONIX 6X120X130 (BALLOONS) ×2
BALLN LUTONIX 6X220X130 (BALLOONS) ×2
BALLN ULTRVRSE 3X100X150 (BALLOONS) ×2
BALLOON LUTONIX 6X120X130 (BALLOONS) IMPLANT
BALLOON LUTONIX 6X220X130 (BALLOONS) IMPLANT
BALLOON ULTRVRSE 3X100X150 (BALLOONS) IMPLANT
CATH ANGIO 5F PIGTAIL 65CM (CATHETERS) IMPLANT
CATH VERT 5X100 (CATHETERS) IMPLANT
DEVICE STARCLOSE SE CLOSURE (Vascular Products) IMPLANT
GLIDEWIRE ADV .035X260CM (WIRE) IMPLANT
KIT ENCORE 26 ADVANTAGE (KITS) IMPLANT
PACK ANGIOGRAPHY (CUSTOM PROCEDURE TRAY) ×2 IMPLANT
SHEATH ANL2 6FRX45 HC (SHEATH) IMPLANT
SHEATH BRITE TIP 5FRX11 (SHEATH) IMPLANT
STENT LIFESTENT 5F 7X100X135 (Permanent Stent) IMPLANT
SYR MEDRAD MARK 7 150ML (SYRINGE) IMPLANT
TUBING CONTRAST HIGH PRESS 72 (TUBING) IMPLANT
WIRE G V18X300CM (WIRE) IMPLANT
WIRE GUIDERIGHT .035X150 (WIRE) IMPLANT

## 2023-03-23 NOTE — Care Management Important Message (Signed)
Important Message  Patient Details  Name: Kenneth [place] MRN: [id number] Date of Birth: 1946-03-12   Medicare Important Message Given:  Yes     Johnell Comings 03/23/2023, 10:59 AM

## 2023-03-23 NOTE — TOC Transition Note (Signed)
Transition of Care [facility]) - CM/SW Discharge Note   Patient Details  Name: Kenneth [place] MRN: [id number] Date of Birth: May 09, 1946  [facility] CM/SW Contact:  Darleene Cleaver, LCSW Phone Number: 03/23/2023, 2:23 PM   Clinical Narrative:     Per physician and therapy patient does not have any other needs.  TOC signing off.         Patient Goals and CMS Choice      Discharge Placement                         Discharge Plan and Services Additional resources added to the After Visit Summary for                                       Social Determinants of Health (SDOH) Interventions SDOH Screenings   Tobacco Use: Medium Risk (03/22/2023)     Readmission Risk Interventions     No data to display

## 2023-03-23 NOTE — Evaluation (Signed)
Physical Therapy Evaluation Patient Details Name: Kenneth Duarte MRN: [id number] DOB: 1946-04-16 Today's Date: 03/23/2023  History of Present Illness  Kenneth Duarte is a 76yoM who comes to [facility] on 4/2 c increased pain in foot. PMH: DM, neuropathy, followed by podiatry. Pt admitted with diabetic foot infection. Pt underwent Rt 5th ray partial resection with podiatry on 4/4 to address a necrotic Rt MTPJ. Pt seen by vascular surgery on 03/22/22 for percutaneous transluminal angioplasty. Postoperatively, podiatry has ordered PWB heel contact in surgical shoe.  Clinical Impression  Pt in bed on entry, wife at bedside. Background details collected. Pain is at goal. Pt educated on weightbearing precautions and use of DME as ordered by provider. Pt demonstrates modified independence in all mobility, reports success in executing heel weightbearing in gait while using RW. Discussed situational recommendations for RW use v other devices already available at home. No additional skilled PT needs at this time. Pt moving very close to baseline- shoulder he DC in the next 24 hours, I anticipate no need for PT services in the near future.      Recommendations for follow up therapy are one component of a multi-disciplinary discharge planning process, led by the attending physician.  Recommendations may be updated based on patient status, additional functional criteria and insurance authorization.  Follow Up Recommendations       Assistance Recommended at Discharge PRN  Patient can return home with the following  Assist for transportation;Assistance with cooking/housework    Equipment Recommendations Rolling walker (2 wheels)  Recommendations for Other Services       Functional Status Assessment Patient has had a recent decline in their functional status and demonstrates the ability to make significant improvements in function in a reasonable and predictable amount of time.     Precautions / Restrictions  Restrictions RLE Weight Bearing: Partial weight bearing RLE Partial Weight Bearing Percentage or Pounds: heel weight bearing in postop shoe      Mobility  Bed Mobility Overal bed mobility: Independent                  Transfers Overall transfer level: Independent Equipment used: None                    Ambulation/Gait   Gait Distance (Feet): 100 Feet (distance limited by auuthor to limit load bearing on residual limb.) Assistive device: Rolling walker (2 wheels) Gait Pattern/deviations: Step-to pattern       General Gait Details: maintains weight on heel as directed  Stairs            Wheelchair Mobility    Modified Rankin (Stroke Patients Only)       Balance Overall balance assessment: Modified Independent                                           Pertinent Vitals/Pain Pain Assessment Pain Assessment: 0-10 Pain Score: 4  Pain Location: RLE, foot Pain Descriptors / Indicators: Aching Pain Intervention(s): Limited activity within patient's tolerance, Monitored during session, Premedicated before session    Home Living Family/patient expects to be discharged to:: Private residence Living Arrangements: Spouse/significant other Available Help at Discharge: Family Type of Home: House Home Access: Stairs to enter   Secretary/administrator of Steps: 1   Home Layout: One level Home Equipment: Cane - single point;Crutches;Cane - quad      Prior  Function Prior Level of Function : Independent/Modified Independent;Working/employed;Driving             Mobility Comments: independent       Leisure centre managerHand Dominance        Extremity/Trunk Assessment                Communication      Cognition                                                General Comments      Exercises     Assessment/Plan    PT Assessment Patient does not need any further PT services  PT Problem List         PT  Treatment Interventions      PT Goals (Current goals can be found in the Care Plan section)  Acute Rehab PT Goals PT Goal Formulation: All assessment and education complete, DC therapy    Frequency       Co-evaluation               AM-PAC PT "6 Clicks" Mobility  Outcome Measure Help needed turning from your back to your side while in a flat bed without using bedrails?: None Help needed moving from lying on your back to sitting on the side of a flat bed without using bedrails?: None Help needed moving to and from a bed to a chair (including a wheelchair)?: None Help needed standing up from a chair using your arms (e.g., wheelchair or bedside chair)?: None Help needed to walk in hospital room?: A Little Help needed climbing 3-5 steps with a railing? : A Little 6 Click Score: 22    End of Session Equipment Utilized During Treatment: Gait belt Activity Tolerance: Patient tolerated treatment well;No increased pain Patient left: in bed;with family/visitor present;with call bell/phone within reach   PT Visit Diagnosis: Other abnormalities of gait and mobility (R26.89)    Time: [id number] PT Time Calculation (min) (ACUTE ONLY): 18 min   Charges:   PT Evaluation $PT Eval Low Complexity: 1 Low PT Treatments $Gait Training: 8-22 mins      12:58 PM, 03/23/23 Rosamaria LintsAllan C Dajion Bickford, PT, DPT Physical Therapist - [facility]  [phone] (ASCOM)    Kenneth Duarte C 03/23/2023, 12:56 PM

## 2023-03-23 NOTE — Plan of Care (Signed)
Patient discharged per MD orders at this time.All dc instructions,education and medications reviewed with the patient.Pt expressed understanding and will comply with dc instructions.f/u appointments was also communicated to the patient.no verbal c/o or any ssx of distress.Pt was discharged home with self-care per order.Pt was transported home by spouse in a privately owned vehicle. 

## 2023-03-23 NOTE — Progress Notes (Signed)
Pharmacy Antibiotic Note  Kenneth Duarte is a 77 y.o. 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  Pharmacy has been consulted for Vancomycin dosing.  Plan: Continue vancomycin to 1000 mg IV every 12 hours Vancomycin levels at steady state or as clinically indicated Ceftriaxone 2 grams IV every 24 hours ordered by provider Flagyl 500 mg IV every 12 hours ordered by provider Follow renal function and culture results for needed adjustments   Height: 5\' 11"  (180.3 cm) Weight: 90.7 kg (200 lb) IBW/kg (Calculated) : 75.3  Temp (24hrs), Avg:98.1 F (36.7 C), Min:98 F (36.7 C), Max:98.3 F (36.8 C)  Recent Labs  Lab 03/20/23 1034 03/20/23 1159 03/21/23 0546 03/22/23 0607 03/23/23 0530  WBC 14.1*  --  7.8 5.9 5.6  CREATININE 2.38*  --  1.67* 1.03 1.08  LATICACIDVEN  --  1.2  --   --   --      Estimated Creatinine Clearance: 69.2 mL/min (by C-G formula based on SCr of 1.08 mg/dL).    Allergies  Allergen Reactions   Blood-Group Specific Substance Other (See Comments)    Patient received almost 2 units FFP during a procedure. Became hypotensive and developed rash   Cortisone Acetate [Cortisone] Rash and Other (See Comments)    Antimicrobials this admission: 4/2 Vancomycin >>  4/2 CRO >>  4/2 Metronidazole >> 4/2 Zosyn x 1 dose in ED   Dose adjustments this admission: 4/3: Vancomycin 1750 mg q48h>>1250mg  q24h 4/4: Vancomycin 1250 q24h>>1g q12h  Microbiology results: 4/3: Wound: pending  Thank you for allowing pharmacy to be a part of this patient's care.  Barrie Folk, PharmD 03/23/2023 11:53 AM

## 2023-03-23 NOTE — Discharge Summary (Signed)
Physician Discharge Summary  [place] ZOX:[id number] DOB: June 21, 1946 DOA: 03/20/2023  PCP: Emogene Morgan, MD  Admit date: 03/20/2023 Discharge date: 03/23/2023  Admitted From: home  Disposition:  home   Recommendations for Outpatient Follow-up:  Follow up with PCP in 1-2 weeks F/u w/ podiatry, Dr. Excell Seltzer, 1 week  Home Health: no  Equipment/Devices:  Discharge Condition: stable  CODE STATUS: full  Diet recommendation: Heart Healthy / Carb Modified  Brief/Interim Summary: HPI was taken from Dr. Alvester Morin: Kenneth Duarte is a 77 y.o. male with medical history significant of CHF, coronary artery disease, type 2 diabetes, GERD, gout, hyperlipidemia, hypertension presenting with diabetic foot infection.  Patient reports worsening right foot lateral redness and pain since about June of last year.  Has been followed by Dr.Cline w/ podiatry outpatient.  Has had relatively stable course with good follow-up.  Patient reports worsening yellow discharge approximately 3 days ago.  No fevers or chills.  Positive redness around affected area.  Denies any trauma.  Has been wearing orthotic since last year.  Per the patient, blood sugars have been well-controlled in the 150s.  No alcohol or tobacco use.  No chest pain or shortness of breath.  No nausea or vomiting.  Was evaluated in podiatry clinic earlier today with noted worsening infection and necrotic tissue and as well as ulceration.  Patient was then sent to the ER for further evaluation. Presented to the ER afebrile, hemodynamically stable.  White count 14.1, hemoglobin 10.5, creatinine 2.38, CRP and sed rate are pending.  Right foot MRI with deep ulceration along the plantar aspect of the fifth metatarsal head and fifth MCP joint with underlying 2.5 cm abscess no evidence of osteomyelitis.  Started empiric antibiotics in the ER.  As per Dr. Mayford Knife 4/3-03/23/23: Pt was found to have right diabetic foot infection on admission. Pt was started on IV  vanco, rocephin and podiatry was consulted. 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 Wound cx grew rare gram positive cocci but cx was not finalized at the time of d/c. Pt was d/c home on po doxycycline x 7 days. Final cx results will be reviewed w/ pt by podiatry as an outpatient. Of note, pt had an angiogram on 03/23/23 in which a stent was placed into right mid to distal SFA, angioplasty of the right mid to distal SFA & popliteal artery, angioplasty of right anterior tibial artery both proximally & distally. Of note, patient to keep dressing clean, dry, and intact for the next week and is to follow-up in outpatient clinic for next dressing change as per podiatry. Patient to remain partial weightbearing with heel contact for short distances and surgical shoe as per podiatry. For more information, please see previous progress/consult notes.   Discharge Diagnoses:  Active Problems:   Diabetic foot infection   Benign essential HTN   Diabetes   Dyslipidemia   GERD (gastroesophageal reflux disease)   PAD (peripheral artery disease)   COPD (chronic obstructive pulmonary disease)   CHF (congestive heart failure)   AKI (acute kidney injury)  Diabetic right foot infection: w/ deep ulceration along the plantar aspect of the fifth metatarsal head and fifth MTP joint with underlying 2.5 cm abscess as per MRI. RLE angiogram on 03/23/23 as per vasc surg. 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 Wound cx growing rare gram positive cocci. Continue on IV rocephin, vanco while  inpatient and d/c home on po doxycycline x 7 days. Final cx results to be discussed w/ pt by podiatry outpatient. Podiatry & vasc surg following and recs apprec. PT did not recommend any further therapy    AKI: resolved   Likely chronic diastolic CHF: appears compensated. Continue on coreg. 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 Continue on bronchodilators   GERD: continue on PPI    HLD: continue on statin    Hx of CAD: s/p CABG & stenting. No chest pain currently. Continue on coreg, statin, aspirin, plavix    DM2: HbA1c 7.0, poorly controlled. Continue on SSI w/ accuchecks    HTN: continue on coreg   Thrombocytopenia: WNL today    Discharge Instructions  Discharge Instructions     Diet - low sodium heart healthy   Complete by: As directed    Diet Carb Modified   Complete by: As directed    Discharge instructions   Complete by: As directed    F/u w/ podiatry, Dr. Excell Seltzer or Dr. Alberteen Spindle, w/in 3-5 days. F/u w/ PCP in 1-2 weeks. Patient to keep dressing clean, dry, and intact for the next week and is to follow-up in outpatient clinic for next dressing change. Patient to remain partial weightbearing with heel contact for short distances and surgical shoe.   Discharge instructions   Complete by: As directed    F/u w/ vasc surg, Dr. Wyn Quaker, in 3-4 weeks w/ ABIs.   Increase activity slowly   Complete by: As directed    No wound care   Complete by: As directed    No wound care   Complete by: As directed       Allergies as of 03/23/2023       Reactions   Blood-group Specific Substance Other (See Comments)   Patient received almost 2 units FFP during a procedure. Became hypotensive and developed rash   Cortisone Acetate [cortisone] Rash, Other (See Comments)        Medication List     TAKE these medications    Accu-Chek Aviva Plus test strip Generic drug: glucose blood   Accu-Chek Softclix Lancets lancets   acetaminophen 500 MG tablet Commonly known as: TYLENOL Take 2 tablets (1,000 mg total) by mouth every 6 (six) hours as needed.   albuterol 108 (90 Base) MCG/ACT inhaler Commonly known as: VENTOLIN HFA Inhale into the lungs.   amlodipine-benazepril 2.5-10 MG capsule Commonly known as: LOTREL Take 1 capsule by mouth daily.   aspirin EC 81 MG  tablet Take 1 tablet (81 mg total) by mouth daily.   atorvastatin 80 MG tablet Commonly known as: LIPITOR TAKE 1 TABLET AT BEDTIME   B-D SINGLE USE SWABS REGULAR Pads   carvedilol 12.5 MG tablet Commonly known as: COREG TAKE 1 TABLET TWICE DAILY WITH A MEAL   clopidogrel 75 MG tablet Commonly known as: PLAVIX Take 1 tablet (75 mg total) by mouth daily.   cyanocobalamin 1000 MCG tablet Commonly known as: VITAMIN B12   docusate sodium 100 MG capsule Commonly known as: COLACE Take 100 mg by mouth 2 (two) times daily as needed.   doxycycline 100 MG tablet Commonly known as: ADOXA Take 1 tablet (100 mg total) by mouth 2 (two) times daily for 7 days.   FeroSul 325 (65 FE) MG tablet Generic drug: ferrous sulfate TAKE 1 TABLET EVERY DAY   fluticasone 50 MCG/ACT nasal spray Commonly known as: FLONASE Place into the  nose.   furosemide 20 MG tablet Commonly known as: LASIX TAKE 1 TABLET EVERY DAY   gabapentin 800 MG tablet Commonly known as: NEURONTIN Take 1,600 mg by mouth 3 (three) times daily.   meclizine 25 MG tablet Commonly known as: ANTIVERT TAKE 1 TABLET TWICE DAILY AS NEEDED FOR DIZZINESS   metFORMIN 500 MG 24 hr tablet Commonly known as: GLUCOPHAGE-XR Take 500 mg by mouth 2 (two) times daily.   Narcan 4 MG/0.1ML Liqd nasal spray kit Generic drug: naloxone 1 spray once.   nitroGLYCERIN 0.4 MG SL tablet Commonly known as: NITROSTAT Place 1 tablet (0.4 mg total) under the tongue every 5 (five) minutes as needed for chest pain.   oxyCODONE-acetaminophen 10-325 MG tablet Commonly known as: PERCOCET Take 1 tablet by mouth every 6 (six) hours as needed for up to 5 days for pain. What changed: when to take this   pantoprazole 40 MG tablet Commonly known as: PROTONIX TAKE 1 TABLET EVERY DAY   valACYclovir 1000 MG tablet Commonly known as: VALTREX Take 1,000 mg by mouth as needed.   Vitamin D-1000 Max St 25 MCG (1000 UT) tablet Generic drug:  Cholecalciferol Take 1,000 Units by mouth daily.               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 Schedule an appointment as soon as possible for a visit in 1 week(s).   Specialty: Podiatry Why: For wound re-check Contact information: [address] [place] [address] [phone]         Annice Needyew, Jason S, MD Follow up.   Specialties: Vascular Surgery, Radiology, Interventional Cardiology Why: F/u in 3-4 weeks w/ ABIs Contact information: [address] [place] [address] [phone]                Allergies  Allergen Reactions   Blood-Group Specific Substance Other (See Comments)    Patient received almost 2 units FFP during a procedure. Became hypotensive and developed rash   Cortisone Acetate [Cortisone] Rash and Other (See Comments)    Consultations: Podiatry    Procedures/Studies: DG Foot Complete Right  Result Date: 03/22/2023 CLINICAL DATA:  Postop EXAM: RIGHT FOOT COMPLETE - 3+ VIEW COMPARISON:  Radiographs dated March 20, 2023 FINDINGS: Postsurgical changes for fifth ray transmetatarsal amputation. Soft tissue swelling generalized soft tissue edema about lateral aspect of the foot. Prominent vascular calcifications. IMPRESSION: Postsurgical changes for fifth ray transmetatarsal amputation. Electronically Signed   By: Larose HiresImran  Ahmed D.O.   On: 03/22/2023 09:38   MR FOOT RIGHT WO CONTRAST  Result Date: 03/20/2023 CLINICAL DATA:  Lateral plantar foot ulcer. EXAM: MRI OF THE RIGHT FOREFOOT WITHOUT CONTRAST TECHNIQUE: Multiplanar, multisequence MR imaging of  the right forefoot was performed. No intravenous contrast was administered. COMPARISON:  Right foot x-rays from same day. FINDINGS: Bones/Joint/Cartilage No marrow signal abnormality. No fracture or dislocation. Minimal degenerative changes of the first MTP joint. Small fifth  MTP joint effusion. Ligaments Collateral ligaments are intact. Muscles and Tendons Flexor and extensor tendons are intact. No tenosynovitis. Complete fatty atrophy of the of the intrinsic foot muscles. Soft tissue Ulceration along the plantar aspect of the fifth metatarsal head and fifth MTP joint with underlying 2.0 x 0.6 x 2.5 cm fluid collection (series 11, image 26; series 9, image 18). Ulceration extends to the fifth MTP joint capsule. Prominent dorsal foot soft tissue swelling. No soft tissue mass. IMPRESSION: 1. Deep ulceration along the plantar aspect of the fifth metatarsal head and fifth MTP joint with underlying 2.5 cm abscess. 2. Ulceration extends to the fifth MTP joint capsule and there is an underlying small joint effusion, which may be reactive, although early septic arthritis is not excluded. No evidence of osteomyelitis. Electronically Signed   By: Obie Dredge M.D.   On: 03/20/2023 15:31   DG Foot Complete Right  Result Date: 03/20/2023 CLINICAL DATA:  Lateral plantar foot wound. EXAM: RIGHT FOOT COMPLETE - 3+ VIEW COMPARISON:  None Available. FINDINGS: Superficial plantar soft tissue ulceration at the level of the fifth MTP joint with associated soft tissue swelling. No underlying bony destruction or periosteal reaction. No acute fracture or dislocation. Minimal degenerative changes of the first MTP joint. Bone mineralization is normal. IMPRESSION: 1. Superficial plantar soft tissue ulceration at the level of the fifth MTP joint. No radiographic evidence of osteomyelitis. Electronically Signed   By: Obie Dredge M.D.   On: 03/20/2023 13:51   (Echo, Carotid, EGD, Colonoscopy, ERCP)    Subjective: Pt c/o not  sleeping well at night   Discharge Exam: Vitals:   03/23/23 1000 03/23/23 1015  BP: (!) 150/63 (!) 142/74  Pulse: 61 60  Resp: 16 16  Temp:    SpO2: 96% 97%   Vitals:   03/23/23 0938 03/23/23 0945 03/23/23 1000 03/23/23 1015  BP: (!) 150/93 (!) 158/59 (!) 150/63 (!) 142/74  Pulse: 60 60 61 60  Resp: 11 14 16 16   Temp:      TempSrc:      SpO2: 98% 98% 96% 97%  Weight:      Height:        General: Pt is alert, awake, not in acute distress Cardiovascular: S1/S2 +, no rubs, no gallops Respiratory: CTA bilaterally, no wheezing, no rhonchi Abdominal: Soft, NT, ND, bowel sounds + Extremities: right foot is dressed & dressing C/D/I    The results of significant diagnostics from this hospitalization (including imaging, microbiology, ancillary and laboratory) are listed below for reference.     Microbiology: Recent Results (from the past 240 hour(s))  Aerobic/Anaerobic Culture w Gram Stain (surgical/deep wound)     Status: None (Preliminary result)   Collection Time: 03/21/23  4:00 PM   Specimen: Wound  Result Value Ref Range Status   Specimen Description   Final    WOUND RIGHT FOOT Performed at [facility], [address] [place], [place] [address]    Special Requests   Final    NONE Performed at [facility], [address] [place], [place] [address]    Gram Stain NO WBC SEEN RARE GRAM POSITIVE COCCI   Final   Culture   Final    CULTURE REINCUBATED FOR BETTER GROWTH Performed at [facility], [address], [place], [place] [address]    Report Status PENDING  Incomplete     Labs: BNP (last 3 results) No results for input(s): "BNP" in the last 8760 hours. Basic  Metabolic Panel: Recent Labs  Lab 03/20/23 1034 03/21/23 0546 03/22/23 0607 03/23/23 0530  NA 131* 135 141 140  K 4.1 4.4 4.3 4.2  CL 101 109 113* 110  CO2 21* 20* 21* 23  GLUCOSE 159* 122* 168* 181*  BUN 34* 29* 14 11  CREATININE 2.38* 1.67* 1.03 1.08  CALCIUM 7.8*  7.5* 8.6* 8.3*   Liver Function Tests: Recent Labs  Lab 03/20/23 1034 03/21/23 0546  AST 17 13*  ALT 17 15  ALKPHOS 57 51  BILITOT 1.5* 0.9  PROT 6.6 6.2*  ALBUMIN 3.4* 3.0*   No results for input(s): "LIPASE", "AMYLASE" in the last 168 hours. No results for input(s): "AMMONIA" in the last 168 hours. CBC: Recent Labs  Lab 03/20/23 1034 03/21/23 0546 03/22/23 0607 03/23/23 0530  WBC 14.1* 7.8 5.9 5.6  HGB 10.5* 9.9* 10.6* 9.9*  HCT 31.6* 29.9* 31.6* 30.1*  MCV 87.8 87.4 86.6 87.8  PLT 130* 140* 174 160   Cardiac Enzymes: No results for input(s): "CKTOTAL", "CKMB", "CKMBINDEX", "TROPONINI" in the last 168 hours. BNP: Invalid input(s): "POCBNP" CBG: Recent Labs  Lab 03/22/23 1221 03/22/23 1632 03/22/23 2109 03/23/23 0739 03/23/23 1233  GLUCAP 195* 103* 130* 138* 171*   D-Dimer No results for input(s): "DDIMER" in the last 72 hours. Hgb A1c No results for input(s): "HGBA1C" in the last 72 hours. Lipid Profile No results for input(s): "CHOL", "HDL", "LDLCALC", "TRIG", "CHOLHDL", "LDLDIRECT" in the last 72 hours. Thyroid function studies No results for input(s): "TSH", "T4TOTAL", "T3FREE", "THYROIDAB" in the last 72 hours.  Invalid input(s): "FREET3" Anemia work up No results for input(s): "VITAMINB12", "FOLATE", "FERRITIN", "TIBC", "IRON", "RETICCTPCT" in the last 72 hours. Urinalysis    Component Value Date/Time   COLORURINE YELLOW 04/10/2017 1442   APPEARANCEUR CLEAR 04/10/2017 1442   APPEARANCEUR Clear 03/06/2014 1708   LABSPEC 1.010 04/10/2017 1442   LABSPEC 1.002 03/06/2014 1708   PHURINE 5.5 04/10/2017 1442   GLUCOSEU NEGATIVE 04/10/2017 1442   GLUCOSEU Negative 03/06/2014 1708   HGBUR NEGATIVE 04/10/2017 1442   BILIRUBINUR NEGATIVE 04/10/2017 1442   BILIRUBINUR Negative 03/06/2014 1708   KETONESUR NEGATIVE 04/10/2017 1442   PROTEINUR NEGATIVE 04/10/2017 1442   NITRITE NEGATIVE 04/10/2017 1442   LEUKOCYTESUR NEGATIVE 04/10/2017 1442    LEUKOCYTESUR Negative 03/06/2014 1708   Sepsis Labs Recent Labs  Lab 03/20/23 1034 03/21/23 0546 03/22/23 0607 03/23/23 0530  WBC 14.1* 7.8 5.9 5.6   Microbiology Recent Results (from the past 240 hour(s))  Aerobic/Anaerobic Culture w Gram Stain (surgical/deep wound)     Status: None (Preliminary result)   Collection Time: 03/21/23  4:00 PM   Specimen: Wound  Result Value Ref Range Status   Specimen Description   Final    WOUND RIGHT FOOT Performed at [facility], [address] [place], [place] [address]    Special Requests   Final    NONE Performed at [facility], [address] [place], [place] [address]    Gram Stain NO WBC SEEN RARE GRAM POSITIVE COCCI   Final   Culture   Final    CULTURE REINCUBATED FOR BETTER GROWTH Performed at [facility], [address] [place], [place] [address]    Report Status PENDING  Incomplete     Time coordinating discharge: Over 30 minutes  SIGNED:   Charise KillianJamiese M Naysha Sholl, MD  [facility] 03/23/2023, 2:13 PM Pager   If 7PM-7AM, please contact night-coverage [email or web address]

## 2023-03-23 NOTE — Discharge Instructions (Signed)
Podiatry discharge instructions: 1.  Keep dressings clean, dry, and intact.  If loosening or saturation or other concerns contact clinic for advice. 2.  Remain partial weightbearing with heel contact and surgical shoe for short distances right side. 3.  Take antibiotics as prescribed until gone.

## 2023-03-23 NOTE — Progress Notes (Signed)
OT Cancellation Note  Patient Details Name: Kenneth Duarte MRN: [id number] DOB: 23-Mar-1946   Cancelled Treatment:    Reason Eval/Treat Not Completed: OT screened, no needs identified, will sign off. Chart reviewed, per conversation with pt, at functional baseline, no skilled acute OT needs identified, will sign off.   Kathie Dike, M.S. OTR/L  03/23/23, 2:48 PM  ascom [phone]

## 2023-03-23 NOTE — Progress Notes (Signed)
PODIATRY / FOOT AND ANKLE SURGERY PROGRESS NOTE  Reason for consult: Right foot wound  Chief Complaint: Right foot wound/infection   HPI: Kenneth Duarte is a 77 y.o. male who presents resting bed comfortably status post 1 day right partial fifth ray amputation with rotational skin flap closure.  Patient underwent angiogram today and had stent placement to the right lower extremity with Dr. Wyn Quakerew.  Patient overall notes no pain to the right foot and is doing pretty well today.  Patient also just worked with physical therapy with partial weightbearing heel contact and surgical shoe for short distances and did pretty well.  PMHx:  Past Medical History:  Diagnosis Date   Arthritis    Chronic pain syndrome    Congestive heart failure 1980   Coronary artery disease    a. s/p 3V CABG (03/2017). b. NSTEMI 06/2017 with progressive graft disease, s/p DES to SVG-OM.   Diabetes    DIET   Dyspnea    with exertion   ED (erectile dysfunction)    GERD (gastroesophageal reflux disease)    Gout    Headache    Hyperlipemia    Hypertension    CONTROLLED ON MEDS   IDA (iron deficiency anemia) 10/21/2019   Neuropathy of both feet    Seborrheic keratosis    Sinus congestion    Vertigo    Wears dentures    PARTIAL UPPER    Surgical Hx:  Past Surgical History:  Procedure Laterality Date   COLONOSCOPY WITH PROPOFOL N/A 10/25/2015   Procedure: COLONOSCOPY WITH PROPOFOL;  Surgeon: Midge Miniumarren Wohl, MD;  [facility];  Service: Endoscopy;  Laterality: N/A;  DIABETIC-ORAL MEDS   COLONOSCOPY WITH PROPOFOL N/A 10/22/2020   Procedure: COLONOSCOPY CANCELED;  Surgeon: Midge MiniumWohl, Darren, MD;  [facility];  Service: Endoscopy;  Laterality: N/A;  procedure aborted poor prep   COLONOSCOPY WITH PROPOFOL N/A 10/25/2020   Procedure: COLONOSCOPY WITH PROPOFOL;  Surgeon: Midge MiniumWohl, Darren, MD;  [facility];  Service: Endoscopy;  Laterality: N/A;   CORONARY ARTERY BYPASS GRAFT N/A  04/10/2017   Procedure: CORONARY ARTERY BYPASS GRAFTING (CABG) x three , using left internal mammary artery and right leg greater saphenous vein harvested endoscopically;  Surgeon: Kerin PernaPeter Van Trigt, MD;  [facility];  Service: Open Heart Surgery;  Laterality: N/A;   CORONARY ARTERY BYPASS GRAFT     CORONARY STENT INTERVENTION N/A 06/19/2017   Procedure: Coronary Stent Intervention;  Surgeon: Corky CraftsVaranasi, Jayadeep S, MD;  [facility];  Service: Cardiovascular;  Laterality: N/A;   ESOPHAGOGASTRODUODENOSCOPY (EGD) WITH PROPOFOL N/A 10/22/2020   Procedure: ESOPHAGOGASTRODUODENOSCOPY (EGD) WITH PROPOFOL;  Surgeon: Midge MiniumWohl, Darren, MD;  [facility];  Service: Endoscopy;  Laterality: N/A;  Diabetic - oral meds   EXCISION PARTIAL PHALANX Right 03/22/2023   Procedure: EXCISION  PHALANX;  Surgeon: Rosetta PosnerBaker, Jrue Jarriel, DPM;  Location: [facility] ORS;  Service: Podiatry;  Laterality: Right;   IRRIGATION AND DEBRIDEMENT FOOT Right 03/22/2023   Procedure: IRRIGATION AND DEBRIDEMENT FOOT;  Surgeon: Rosetta PosnerBaker, Sandi Towe, DPM;  Location: [facility] ORS;  Service: Podiatry;  Laterality: Right;   KNEE SURGERY Right    over 20 years ago   LEFT HEART CATH AND CORONARY ANGIOGRAPHY Left 02/22/2017   Procedure: Left Heart Cath and Coronary Angiography;  Surgeon: Lamar BlinksBruce J Kowalski, MD;  Location: [facility] INVASIVE CV LAB;  Service: Cardiovascular;  Laterality: Left;   LEFT HEART CATH AND CORS/GRAFTS ANGIOGRAPHY N/A 06/18/2017   Procedure: Left Heart Cath and Cors/Grafts  Angiography and PCI;  Surgeon: Marcina MillardParaschos, Alexander, MD;  Location: [facility] INVASIVE CV LAB;  Service: Cardiovascular;  Laterality: N/A;   POLYPECTOMY  10/25/2015   Procedure: POLYPECTOMY;  Surgeon: Midge Miniumarren Wohl, MD;  [facility];  Service: Endoscopy;;   ROTATOR CUFF REPAIR Right 2012   TEE WITHOUT CARDIOVERSION N/A 04/10/2017   Procedure: TRANSESOPHAGEAL ECHOCARDIOGRAM (TEE);  Surgeon: Kerin PernaPeter Van Trigt, MD;  [facility];  Service: Open Heart  Surgery;  Laterality: N/A;    FHx:  Family History  Problem Relation Age of Onset   Cancer Father    Heart disease Father    Heart attack Father    Cancer Mother    Heart disease Brother    Diabetes Brother    Prostate cancer Brother    Diabetes Sister    Bladder Cancer Neg Hx    Kidney disease Neg Hx     Social History:  reports that he quit smoking about 43 years ago. His smoking use included cigarettes. He has a 30.00 pack-year smoking history. He has never used smokeless tobacco. He reports that he does not drink alcohol and does not use drugs.  Allergies:  Allergies  Allergen Reactions   Blood-Group Specific Substance Other (See Comments)    Patient received almost 2 units FFP during a procedure. Became hypotensive and developed rash   Cortisone Acetate [Cortisone] Rash and Other (See Comments)    Medications Prior to Admission  Medication Sig Dispense Refill   acetaminophen (TYLENOL) 500 MG tablet Take 2 tablets (1,000 mg total) by mouth every 6 (six) hours as needed. 30 tablet 0   amlodipine-benazepril (LOTREL) 2.5-10 MG capsule Take 1 capsule by mouth daily.     aspirin 81 MG tablet Take 1 tablet (81 mg total) by mouth daily. 90 tablet 1   atorvastatin (LIPITOR) 80 MG tablet TAKE 1 TABLET AT BEDTIME 90 tablet 3   carvedilol (COREG) 12.5 MG tablet TAKE 1 TABLET TWICE DAILY WITH A MEAL 180 tablet 1   clopidogrel (PLAVIX) 75 MG tablet Take 1 tablet (75 mg total) by mouth daily. 90 tablet 1   docusate sodium (COLACE) 100 MG capsule Take 100 mg by mouth 2 (two) times daily as needed.      FEROSUL 325 (65 Fe) MG tablet TAKE 1 TABLET EVERY DAY 90 tablet 0   furosemide (LASIX) 20 MG tablet TAKE 1 TABLET EVERY DAY 90 tablet 3   gabapentin (NEURONTIN) 800 MG tablet Take 1,600 mg by mouth 3 (three) times daily.     meclizine (ANTIVERT) 25 MG tablet TAKE 1 TABLET TWICE DAILY AS NEEDED FOR DIZZINESS 180 tablet 3   metFORMIN (GLUCOPHAGE-XR) 500 MG 24 hr tablet Take 500 mg by mouth  2 (two) times daily.      NARCAN 4 MG/0.1ML LIQD nasal spray kit 1 spray once.     nitroGLYCERIN (NITROSTAT) 0.4 MG SL tablet Place 1 tablet (0.4 mg total) under the tongue every 5 (five) minutes as needed for chest pain. 25 tablet 3   oxyCODONE-acetaminophen (PERCOCET) 10-325 MG tablet Take 1 tablet by mouth every 4 (four) hours as needed for pain. 40 tablet 0   pantoprazole (PROTONIX) 40 MG tablet TAKE 1 TABLET EVERY DAY 90 tablet 3   valACYclovir (VALTREX) 1000 MG tablet Take 1,000 mg by mouth as needed.     vitamin B-12 (CYANOCOBALAMIN) 1000 MCG tablet      VITAMIN D-1000 MAX ST 25 MCG (1000 UT) tablet Take 1,000 Units by mouth daily.  ACCU-CHEK AVIVA PLUS test strip  (Patient not taking: Reported on 05/10/2022)     Accu-Chek Softclix Lancets lancets  (Patient not taking: Reported on 05/10/2022)     albuterol (PROVENTIL HFA;VENTOLIN HFA) 108 (90 Base) MCG/ACT inhaler Inhale into the lungs. (Patient not taking: Reported on 03/20/2023)     Alcohol Swabs (B-D SINGLE USE SWABS REGULAR) PADS      fluticasone (FLONASE) 50 MCG/ACT nasal spray Place into the nose. (Patient not taking: Reported on 03/20/2023)      Physical Exam: General: Alert and oriented.  No apparent distress.  Vascular: DP/PT pulses diminished bilaterally but palpable, no hair growth noted to digits or feet bilaterally.  Mild nonpitting edema present to bilateral lower extremities.  Neuro: Light touch sensation absent to bilateral lower extremities.  Derm: Right partial fifth ray amputation site appears to be well coapted with sutures intact, minimal to no erythema, mild edema, no ecchymosis, no active drainage, no signs of infection present.    MSK: 5/5 strength bilateral lower extremity muscle groups.  Partial right fifth ray amputation.  Results for orders placed or performed during the hospital encounter of 03/20/23 (from the past 48 hour(s))  Aerobic/Anaerobic Culture w Gram Stain (surgical/deep wound)     Status: None  (Preliminary result)   Collection Time: 03/21/23  4:00 PM   Specimen: Wound  Result Value Ref Range   Specimen Description      WOUND RIGHT FOOT Performed at [facility], [address] [place], [place] [address]    Special Requests      NONE Performed at [facility], [address] [place], [place] [address]    Gram Stain NO WBC SEEN RARE GRAM POSITIVE COCCI     Culture      CULTURE REINCUBATED FOR BETTER GROWTH Performed at [facility], [address], [place], [place] [address]    Report Status PENDING   Glucose, capillary     Status: Abnormal   Collection Time: 03/21/23  4:52 PM  Result Value Ref Range   Glucose-Capillary 120 (H) 70 - 99 mg/dL    Comment: Glucose reference range applies only to samples taken after fasting for at least 8 hours.  Glucose, capillary     Status: None   Collection Time: 03/21/23 10:39 PM  Result Value Ref Range   Glucose-Capillary 94 70 - 99 mg/dL    Comment: Glucose reference range applies only to samples taken after fasting for at least 8 hours.  CBC     Status: Abnormal   Collection Time: 03/22/23  6:07 AM  Result Value Ref Range   WBC 5.9 4.0 - 10.5 K/uL   RBC 3.65 (L) 4.22 - 5.81 MIL/uL   Hemoglobin 10.6 (L) 13.0 - 17.0 g/dL   HCT 82.9 (L) 56.2 - 13.0 %   MCV 86.6 80.0 - 100.0 fL   MCH 29.0 26.0 - 34.0 pg   MCHC 33.5 30.0 - 36.0 g/dL   RDW 86.5 78.4 - 69.6 %   Platelets 174 150 - 400 K/uL   nRBC 0.0 0.0 - 0.2 %    Comment: Performed at [facility], [address], [place], [place] [address]  Basic metabolic panel     Status: Abnormal   Collection Time: 03/22/23  6:07 AM  Result Value Ref Range   Sodium 141 135 - 145 mmol/L   Potassium 4.3 3.5 - 5.1 mmol/L   Chloride 113 (H) 98 - 111 mmol/L   CO2 21 (  L) 22 - 32 mmol/L   Glucose, Bld 168 (H) 70 - 99 mg/dL    Comment: Glucose reference range applies only to samples taken after fasting for at least 8 hours.   BUN 14 8 - 23 mg/dL   Creatinine,  Ser 9.56 0.61 - 1.24 mg/dL   Calcium 8.6 (L) 8.9 - 10.3 mg/dL   GFR, Estimated >21 >30 mL/min    Comment: (NOTE) Calculated using the CKD-EPI Creatinine Equation (2021)    Anion gap 7 5 - 15    Comment: Performed at [facility], [address] [place], [place] [address]  Glucose, capillary     Status: Abnormal   Collection Time: 03/22/23  7:31 AM  Result Value Ref Range   Glucose-Capillary 150 (H) 70 - 99 mg/dL    Comment: Glucose reference range applies only to samples taken after fasting for at least 8 hours.  Glucose, capillary     Status: Abnormal   Collection Time: 03/22/23  9:12 AM  Result Value Ref Range   Glucose-Capillary 149 (H) 70 - 99 mg/dL    Comment: Glucose reference range applies only to samples taken after fasting for at least 8 hours.  Glucose, capillary     Status: Abnormal   Collection Time: 03/22/23 12:21 PM  Result Value Ref Range   Glucose-Capillary 195 (H) 70 - 99 mg/dL    Comment: Glucose reference range applies only to samples taken after fasting for at least 8 hours.  Glucose, capillary     Status: Abnormal   Collection Time: 03/22/23  4:32 PM  Result Value Ref Range   Glucose-Capillary 103 (H) 70 - 99 mg/dL    Comment: Glucose reference range applies only to samples taken after fasting for at least 8 hours.  Glucose, capillary     Status: Abnormal   Collection Time: 03/22/23  9:09 PM  Result Value Ref Range   Glucose-Capillary 130 (H) 70 - 99 mg/dL    Comment: Glucose reference range applies only to samples taken after fasting for at least 8 hours.   Comment 1 Notify RN   CBC     Status: Abnormal   Collection Time: 03/23/23  5:30 AM  Result Value Ref Range   WBC 5.6 4.0 - 10.5 K/uL   RBC 3.43 (L) 4.22 - 5.81 MIL/uL   Hemoglobin 9.9 (L) 13.0 - 17.0 g/dL   HCT 46.9 (L) 62.9 - 52.8 %   MCV 87.8 80.0 - 100.0 fL   MCH 28.9 26.0 - 34.0 pg   MCHC 32.9 30.0 - 36.0 g/dL   RDW 41.3 24.4 - 01.0 %   Platelets 160 150 - 400 K/uL   nRBC 0.0 0.0  - 0.2 %    Comment: Performed at [facility], [address], [place], [place] [address]  Basic metabolic panel     Status: Abnormal   Collection Time: 03/23/23  5:30 AM  Result Value Ref Range   Sodium 140 135 - 145 mmol/L   Potassium 4.2 3.5 - 5.1 mmol/L   Chloride 110 98 - 111 mmol/L   CO2 23 22 - 32 mmol/L   Glucose, Bld 181 (H) 70 - 99 mg/dL    Comment: Glucose reference range applies only to samples taken after fasting for at least 8 hours.   BUN 11 8 - 23 mg/dL   Creatinine, Ser 6.64 0.61 - 1.24 mg/dL   Calcium 8.3 (L) 8.9 - 10.3 mg/dL   GFR, Estimated >40 >34 mL/min  Comment: (NOTE) Calculated using the CKD-EPI Creatinine Equation (2021)    Anion gap 7 5 - 15    Comment: Performed at [facility], [address] [place], [place] [address]  Glucose, capillary     Status: Abnormal   Collection Time: 03/23/23  7:39 AM  Result Value Ref Range   Glucose-Capillary 138 (H) 70 - 99 mg/dL    Comment: Glucose reference range applies only to samples taken after fasting for at least 8 hours.  Glucose, capillary     Status: Abnormal   Collection Time: 03/23/23 12:33 PM  Result Value Ref Range   Glucose-Capillary 171 (H) 70 - 99 mg/dL    Comment: Glucose reference range applies only to samples taken after fasting for at least 8 hours.   DG Foot Complete Right  Result Date: 03/22/2023 CLINICAL DATA:  Postop EXAM: RIGHT FOOT COMPLETE - 3+ VIEW COMPARISON:  Radiographs dated March 20, 2023 FINDINGS: Postsurgical changes for fifth ray transmetatarsal amputation. Soft tissue swelling generalized soft tissue edema about lateral aspect of the foot. Prominent vascular calcifications. IMPRESSION: Postsurgical changes for fifth ray transmetatarsal amputation. Electronically Signed   By: Larose Hires D.O.   On: 03/22/2023 09:38    Blood pressure (!) 142/74, pulse 60, temperature 98 F (36.7 C), temperature source Oral, resp. rate 16, height 5\' 11"  (1.803 m), weight 90.7 kg,  SpO2 97 %.  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 -Postoperative x-ray imaging reviewed. -Incision site appears to be well coapted with sutures intact, no dehiscence or signs of infection present. -Reapplied Xeroform gauze to the area followed by 4 x 4 gauze, gauze roll, and Ace wrap.  Patient to keep dressing clean, dry, and intact for the next week and is to follow-up in outpatient clinic for next dressing change. -Patient to remain partial weightbearing with heel contact for short distances and surgical shoe.  Appreciate PT/OT recommendations. -Appreciate vascular recommendations. -Believe the amputation was curative of infection.  Gram-positive cocci growing on wound culture.  Path pending from yesterday's procedure.  Recommend discharging on 7-day course doxycycline.  Will follow-up on culture results on outpatient basis.  Podiatry team to sign off at this time.  Reconsult if any problems arise.  Discharge information placed in chart.  Rosetta Posner, DPM 03/23/2023, 1:18 PM

## 2023-03-23 NOTE — Op Note (Signed)
[facility]  Percutaneous Study/Intervention Procedural Note   Date of Surgery: 03/23/2023  Surgeon(s):Kentarius Partington    Assistants:none  Pre-operative Diagnosis: PAD with ulceration and infection RLE  Post-operative diagnosis:  Same  Procedure(s) Performed:             1.  Ultrasound guidance for vascular access left femoral artery             2.  Catheter placement into right common femoral artery from left femoral approach             3.  Aortogram and selective right lower extremity angiogram             4.  Percutaneous transluminal angioplasty of right anterior tibial artery both proximally and distally with 3 mm diameter by 10 cm length angioplasty balloons             5.  Percutaneous transluminal angioplasty of the right mid to distal SFA and popliteal artery with 6 mm diameter by 22 cm length Lutonix drug-coated angioplasty balloon  6.  Stent placement to the right mid to distal SFA with 7 mm diameter by 10 cm length life stent             7.  StarClose closure device left femoral artery  EBL: 5 cc  Contrast: 55 cc  Fluoro Time: 5.8 minutes  Moderate Conscious Sedation Time: approximately 55 minutes using 4 mg of Versed and 100 mcg of Fentanyl              Indications:  Patient is a 76 y.o.male with ulceration and infection of the right foot the patient is brought in for angiography for further evaluation and potential treatment.  Due to the limb threatening nature of the situation, angiogram was performed for attempted limb salvage. The patient is aware that if the procedure fails, amputation would be expected.  The patient also understands that even with successful revascularization, amputation may still be required due to the severity of the situation.  Risks and benefits are discussed and informed consent is obtained.   Procedure:  The patient was identified and appropriate procedural time out was performed.  The patient was then placed supine on the  table and prepped and draped in the usual sterile fashion. Moderate conscious sedation was administered during a face to face encounter with the patient throughout the procedure with my supervision of the RN administering medicines and monitoring the patient's vital signs, pulse oximetry, telemetry and mental status throughout from the start of the procedure until the patient was taken to the recovery room. Ultrasound was used to evaluate the left common femoral artery.  It was patent .  A digital ultrasound image was acquired.  A Seldinger needle was used to access the left common femoral artery under direct ultrasound guidance and a permanent image was performed.  A 0.035 J wire was advanced without resistance and a 5Fr sheath was placed.  Pigtail catheter was placed into the aorta and an AP aortogram was performed. This demonstrated what appeared to be less than 60% stenosis of the left renal artery and very minimal disease of the right renal artery.  The aorta was widely patent.  There appeared to be some mild left iliac stenosis approaching 50% but not quite hemodynamically significant.  The right iliac arteries had mild disease without hemodynamically significant stenosis. I then crossed the aortic bifurcation and advanced to the right femoral head. Selective right lower extremity angiogram was then  performed. This demonstrated fairly normal common femoral artery, small and somewhat diseased profunda femoris artery, and a fairly normal proximal superficial femoral artery.  The mid to distal superficial femoral artery had calcific chunks creating 60 to 70% stenosis.  The distal SFA normalized but the mid popliteal artery had another area of about 65 to 70% stenosis.  There was then a normal tibial trifurcation.  The tibial vessels were very calcified.  The peroneal artery and posterior tibial artery supplies appear to have any focal stenosis.  The anterior tibial artery had about a 70 to 80% stenosis in the  proximal segment, and another area in the distal segment that had about a 65 to 70% stenosis. It was felt that it was in the patient's best interest to proceed with intervention after these images to avoid a second procedure and a larger amount of contrast and fluoroscopy based off of the findings from the initial angiogram. The patient was systemically heparinized and a 6 [place] sheath was then placed over the [employer] wire. I then used a Kumpe catheter and the advantage wire to navigate through the SFA and popliteal disease and get into the proximal anterior tibial artery.  I then exchanged for a V18 wire and cross the stenosis both proximally and distally in the anterior tibial artery parking the wire in the foot.  I then proceeded with treatment.  The anterior tibial artery was treated both proximally and distally with an inflation of the 3 mm diameter by 10 cm length angioplasty balloon.  Distally this was 8 atm for 1 minute.  Proximally this was 12 atm for 1 minute.  Completion imaging showed less than 30% residual stenosis after angioplasty.  I then addressed the mid to distal SFA and popliteal lesions with a 6 mm diameter by 22 cm length Lutonix drug-coated angioplasty balloon inflated to 8 atm for 1 minute.  Completion imaging showed the popliteal artery to have about a 20% residual stenosis but the SFA and the very calcific areas continued to have greater than 50% stenosis.  I elected to place a stent.  A 7 mm diameter by 10 cm length life stent was deployed and postdilated with a 6 mm diameter Lutonix drug-coated balloon with excellent angiographic completion result and less than 10% residual stenosis. I elected to terminate the procedure. The sheath was removed and StarClose closure device was deployed in the left femoral artery with excellent hemostatic result. The patient was taken to the recovery room in stable condition having tolerated the procedure well.  Findings:                Aortogram:  This demonstrated what appeared to be less than 60% stenosis of the left renal artery and very minimal disease of the right renal artery.  The aorta was widely patent.  There appeared to be some mild left iliac stenosis approaching 50% but not quite hemodynamically significant.  The right iliac arteries had mild disease without hemodynamically significant stenosis             Right Lower Extremity: This demonstrated fairly normal common femoral artery, small and somewhat diseased profunda femoris artery, and a fairly normal proximal superficial femoral artery.  The mid to distal superficial femoral artery had calcific chunks creating 60 to 70% stenosis.  The distal SFA normalized but the mid popliteal artery had another area of about 65 to 70% stenosis.  There was then a normal tibial trifurcation.  The tibial vessels were very calcified.  The peroneal artery and posterior tibial artery supplies appear to have any focal stenosis.  The anterior tibial artery had about a 70 to 80% stenosis in the proximal segment, and another area in the distal segment that had about a 65 to 70% stenosis.   Disposition: Patient was taken to the recovery room in stable condition having tolerated the procedure well.  Complications: None  Festus Barren 03/23/2023 10:04 AM   This note was created with Dragon Medical transcription system. Any errors in dictation are purely unintentional.

## 2023-03-26 ENCOUNTER — Encounter: Payer: Self-pay | Admitting: Oncology

## 2023-03-26 LAB — AEROBIC/ANAEROBIC CULTURE W GRAM STAIN (SURGICAL/DEEP WOUND): Gram Stain: NONE SEEN

## 2023-04-09 ENCOUNTER — Other Ambulatory Visit: Payer: Self-pay | Admitting: Cardiovascular Disease

## 2023-04-13 ENCOUNTER — Other Ambulatory Visit (INDEPENDENT_AMBULATORY_CARE_PROVIDER_SITE_OTHER): Payer: Self-pay | Admitting: Vascular Surgery

## 2023-04-13 DIAGNOSIS — I739 Peripheral vascular disease, unspecified: Secondary | ICD-10-CM

## 2023-04-17 ENCOUNTER — Encounter (INDEPENDENT_AMBULATORY_CARE_PROVIDER_SITE_OTHER): Payer: Self-pay | Admitting: Nurse Practitioner

## 2023-04-17 ENCOUNTER — Ambulatory Visit (INDEPENDENT_AMBULATORY_CARE_PROVIDER_SITE_OTHER): Payer: Medicare HMO | Admitting: Nurse Practitioner

## 2023-04-17 ENCOUNTER — Ambulatory Visit (INDEPENDENT_AMBULATORY_CARE_PROVIDER_SITE_OTHER): Payer: Medicare HMO

## 2023-04-17 VITALS — BP 136/68 | HR 69 | Resp 16 | Wt 189.8 lb

## 2023-04-17 DIAGNOSIS — I10 Essential (primary) hypertension: Secondary | ICD-10-CM | POA: Diagnosis not present

## 2023-04-17 DIAGNOSIS — E1159 Type 2 diabetes mellitus with other circulatory complications: Secondary | ICD-10-CM | POA: Diagnosis not present

## 2023-04-17 DIAGNOSIS — I739 Peripheral vascular disease, unspecified: Secondary | ICD-10-CM | POA: Diagnosis not present

## 2023-04-17 DIAGNOSIS — Z9889 Other specified postprocedural states: Secondary | ICD-10-CM | POA: Diagnosis not present

## 2023-04-18 ENCOUNTER — Observation Stay: Payer: Medicare HMO

## 2023-04-18 ENCOUNTER — Encounter: Payer: Self-pay | Admitting: Internal Medicine

## 2023-04-18 ENCOUNTER — Other Ambulatory Visit: Payer: Self-pay

## 2023-04-18 ENCOUNTER — Inpatient Hospital Stay
Admission: EM | Admit: 2023-04-18 | Discharge: 2023-04-20 | DRG: 504 | Disposition: A | Discharge status: Home / Self Care | Payer: Medicare HMO | Attending: Internal Medicine | Admitting: Internal Medicine

## 2023-04-18 ENCOUNTER — Encounter (INDEPENDENT_AMBULATORY_CARE_PROVIDER_SITE_OTHER): Payer: Self-pay | Admitting: Nurse Practitioner

## 2023-04-18 DIAGNOSIS — Z7902 Long term (current) use of antithrombotics/antiplatelets: Secondary | ICD-10-CM

## 2023-04-18 DIAGNOSIS — E1142 Type 2 diabetes mellitus with diabetic polyneuropathy: Secondary | ICD-10-CM | POA: Diagnosis present

## 2023-04-18 DIAGNOSIS — E1169 Type 2 diabetes mellitus with other specified complication: Secondary | ICD-10-CM | POA: Diagnosis present

## 2023-04-18 DIAGNOSIS — Z8042 Family history of malignant neoplasm of prostate: Secondary | ICD-10-CM

## 2023-04-18 DIAGNOSIS — E785 Hyperlipidemia, unspecified: Secondary | ICD-10-CM | POA: Diagnosis present

## 2023-04-18 DIAGNOSIS — Z955 Presence of coronary angioplasty implant and graft: Secondary | ICD-10-CM

## 2023-04-18 DIAGNOSIS — K219 Gastro-esophageal reflux disease without esophagitis: Secondary | ICD-10-CM | POA: Diagnosis present

## 2023-04-18 DIAGNOSIS — D696 Thrombocytopenia, unspecified: Secondary | ICD-10-CM | POA: Diagnosis present

## 2023-04-18 DIAGNOSIS — Z7984 Long term (current) use of oral hypoglycemic drugs: Secondary | ICD-10-CM

## 2023-04-18 DIAGNOSIS — Z8052 Family history of malignant neoplasm of bladder: Secondary | ICD-10-CM

## 2023-04-18 DIAGNOSIS — R21 Rash and other nonspecific skin eruption: Secondary | ICD-10-CM | POA: Diagnosis not present

## 2023-04-18 DIAGNOSIS — M86271 Subacute osteomyelitis, right ankle and foot: Principal | ICD-10-CM

## 2023-04-18 DIAGNOSIS — Z7982 Long term (current) use of aspirin: Secondary | ICD-10-CM

## 2023-04-18 DIAGNOSIS — G894 Chronic pain syndrome: Secondary | ICD-10-CM | POA: Diagnosis present

## 2023-04-18 DIAGNOSIS — E11621 Type 2 diabetes mellitus with foot ulcer: Secondary | ICD-10-CM | POA: Diagnosis present

## 2023-04-18 DIAGNOSIS — I252 Old myocardial infarction: Secondary | ICD-10-CM

## 2023-04-18 DIAGNOSIS — E1151 Type 2 diabetes mellitus with diabetic peripheral angiopathy without gangrene: Secondary | ICD-10-CM | POA: Diagnosis present

## 2023-04-18 DIAGNOSIS — E861 Hypovolemia: Secondary | ICD-10-CM | POA: Diagnosis present

## 2023-04-18 DIAGNOSIS — L03115 Cellulitis of right lower limb: Secondary | ICD-10-CM | POA: Diagnosis present

## 2023-04-18 DIAGNOSIS — L97519 Non-pressure chronic ulcer of other part of right foot with unspecified severity: Secondary | ICD-10-CM | POA: Diagnosis present

## 2023-04-18 DIAGNOSIS — Z89421 Acquired absence of other right toe(s): Secondary | ICD-10-CM

## 2023-04-18 DIAGNOSIS — Z8249 Family history of ischemic heart disease and other diseases of the circulatory system: Secondary | ICD-10-CM

## 2023-04-18 DIAGNOSIS — Z841 Family history of disorders of kidney and ureter: Secondary | ICD-10-CM

## 2023-04-18 DIAGNOSIS — I2510 Atherosclerotic heart disease of native coronary artery without angina pectoris: Secondary | ICD-10-CM | POA: Diagnosis present

## 2023-04-18 DIAGNOSIS — I110 Hypertensive heart disease with heart failure: Secondary | ICD-10-CM | POA: Diagnosis present

## 2023-04-18 DIAGNOSIS — E860 Dehydration: Secondary | ICD-10-CM | POA: Diagnosis present

## 2023-04-18 DIAGNOSIS — M86171 Other acute osteomyelitis, right ankle and foot: Secondary | ICD-10-CM | POA: Insufficient documentation

## 2023-04-18 DIAGNOSIS — E11628 Type 2 diabetes mellitus with other skin complications: Secondary | ICD-10-CM | POA: Diagnosis present

## 2023-04-18 DIAGNOSIS — I959 Hypotension, unspecified: Secondary | ICD-10-CM | POA: Diagnosis not present

## 2023-04-18 DIAGNOSIS — T8149XA Infection following a procedure, other surgical site, initial encounter: Secondary | ICD-10-CM | POA: Diagnosis present

## 2023-04-18 DIAGNOSIS — Z79899 Other long term (current) drug therapy: Secondary | ICD-10-CM

## 2023-04-18 DIAGNOSIS — N179 Acute kidney failure, unspecified: Secondary | ICD-10-CM | POA: Diagnosis present

## 2023-04-18 DIAGNOSIS — Z833 Family history of diabetes mellitus: Secondary | ICD-10-CM

## 2023-04-18 DIAGNOSIS — I5032 Chronic diastolic (congestive) heart failure: Secondary | ICD-10-CM | POA: Diagnosis present

## 2023-04-18 DIAGNOSIS — Z951 Presence of aortocoronary bypass graft: Secondary | ICD-10-CM

## 2023-04-18 DIAGNOSIS — D509 Iron deficiency anemia, unspecified: Secondary | ICD-10-CM | POA: Diagnosis present

## 2023-04-18 DIAGNOSIS — T501X5A Adverse effect of loop [high-ceiling] diuretics, initial encounter: Secondary | ICD-10-CM | POA: Diagnosis present

## 2023-04-18 DIAGNOSIS — M109 Gout, unspecified: Secondary | ICD-10-CM | POA: Diagnosis present

## 2023-04-18 DIAGNOSIS — T8743 Infection of amputation stump, right lower extremity: Principal | ICD-10-CM | POA: Diagnosis present

## 2023-04-18 DIAGNOSIS — Z87891 Personal history of nicotine dependence: Secondary | ICD-10-CM

## 2023-04-18 DIAGNOSIS — Y838 Other surgical procedures as the cause of abnormal reaction of the patient, or of later complication, without mention of misadventure at the time of the procedure: Secondary | ICD-10-CM | POA: Diagnosis present

## 2023-04-18 LAB — COMPREHENSIVE METABOLIC PANEL
ALT: 18 U/L (ref 0–44)
AST: 22 U/L (ref 15–41)
Albumin: 3.5 g/dL (ref 3.5–5.0)
Alkaline Phosphatase: 59 U/L (ref 38–126)
Anion gap: 11 (ref 5–15)
BUN: 26 mg/dL — ABNORMAL HIGH (ref 8–23)
CO2: 22 mmol/L (ref 22–32)
Calcium: 8.2 mg/dL — ABNORMAL LOW (ref 8.9–10.3)
Chloride: 102 mmol/L (ref 98–111)
Creatinine, Ser: 2.24 mg/dL — ABNORMAL HIGH (ref 0.61–1.24)
GFR, Estimated: 30 mL/min — ABNORMAL LOW (ref >60)
Glucose, Bld: 129 mg/dL — ABNORMAL HIGH (ref 70–99)
Potassium: 4.4 mmol/L (ref 3.5–5.1)
Sodium: 135 mmol/L (ref 135–145)
Total Bilirubin: 1.2 mg/dL (ref 0.3–1.2)
Total Protein: 7.3 g/dL (ref 6.5–8.1)

## 2023-04-18 LAB — CBG MONITORING, ED: Glucose-Capillary: 97 mg/dL (ref 70–99)

## 2023-04-18 LAB — PROTIME-INR
INR: 1.2 (ref 0.8–1.2)
Prothrombin Time: 14.9 seconds (ref 11.4–15.2)

## 2023-04-18 LAB — TROPONIN I (HIGH SENSITIVITY): Troponin I (High Sensitivity): 20 ng/L — ABNORMAL HIGH (ref <18)

## 2023-04-18 LAB — URINALYSIS, ROUTINE W REFLEX MICROSCOPIC
Bilirubin Urine: NEGATIVE
Glucose, UA: NEGATIVE mg/dL
Hgb urine dipstick: NEGATIVE
Ketones, ur: NEGATIVE mg/dL
Leukocytes,Ua: NEGATIVE
Nitrite: NEGATIVE
Protein, ur: NEGATIVE mg/dL
Specific Gravity, Urine: 1.01 (ref 1.005–1.030)
pH: 5 (ref 5.0–8.0)

## 2023-04-18 LAB — CBC WITH DIFFERENTIAL/PLATELET
Abs Immature Granulocytes: 0.03 10*3/uL (ref 0.00–0.07)
Basophils Absolute: 0 10*3/uL (ref 0.0–0.1)
Basophils Relative: 0 %
Eosinophils Absolute: 0 10*3/uL (ref 0.0–0.5)
Eosinophils Relative: 0 %
HCT: 33.2 % — ABNORMAL LOW (ref 39.0–52.0)
Hemoglobin: 11.1 g/dL — ABNORMAL LOW (ref 13.0–17.0)
Immature Granulocytes: 0 %
Lymphocytes Relative: 16 %
Lymphs Abs: 1.5 10*3/uL (ref 0.7–4.0)
MCH: 29.6 pg (ref 26.0–34.0)
MCHC: 33.4 g/dL (ref 30.0–36.0)
MCV: 88.5 fL (ref 80.0–100.0)
Monocytes Absolute: 0.6 10*3/uL (ref 0.1–1.0)
Monocytes Relative: 7 %
Neutro Abs: 7.1 10*3/uL (ref 1.7–7.7)
Neutrophils Relative %: 77 %
Platelets: 157 10*3/uL (ref 150–400)
RBC: 3.75 MIL/uL — ABNORMAL LOW (ref 4.22–5.81)
RDW: 15.7 % — ABNORMAL HIGH (ref 11.5–15.5)
WBC: 9.2 10*3/uL (ref 4.0–10.5)
nRBC: 0 % (ref 0.0–0.2)

## 2023-04-18 LAB — LACTIC ACID, PLASMA
Lactic Acid, Venous: 1.7 mmol/L (ref 0.5–1.9)
Lactic Acid, Venous: 1.7 mmol/L (ref 0.5–1.9)

## 2023-04-18 MED ORDER — VANCOMYCIN HCL 1750 MG/350ML IV SOLN
1750.0000 mg | Freq: Once | INTRAVENOUS | Status: AC
  Administered 2023-04-18: 1750 mg via INTRAVENOUS
  Filled 2023-04-18: qty 350

## 2023-04-18 MED ORDER — GADOBUTROL 1 MMOL/ML IV SOLN
8.0000 mL | Freq: Once | INTRAVENOUS | Status: AC | PRN
  Administered 2023-04-18: 8 mL via INTRAVENOUS

## 2023-04-18 MED ORDER — SODIUM CHLORIDE 0.9% FLUSH: 3.0000 mL | Freq: Two times a day (BID) | INTRAVENOUS | Status: DC

## 2023-04-18 MED ORDER — VANCOMYCIN HCL IN DEXTROSE 1-5 GM/200ML-% IV SOLN: 1000.0000 mg | Freq: Once | INTRAVENOUS | Status: DC

## 2023-04-18 MED ORDER — PANTOPRAZOLE SODIUM 40 MG PO TBEC
40.0000 mg | DELAYED_RELEASE_TABLET | Freq: Every day | ORAL | Status: DC
  Administered 2023-04-18 – 2023-04-20 (×3): 40 mg via ORAL
  Filled 2023-04-18 (×3): qty 1

## 2023-04-18 MED ORDER — ASPIRIN 81 MG PO TBEC
81.0000 mg | DELAYED_RELEASE_TABLET | Freq: Every day | ORAL | Status: DC
  Administered 2023-04-20: 81 mg via ORAL
  Filled 2023-04-18 (×3): qty 1

## 2023-04-18 MED ORDER — ACETAMINOPHEN 325 MG PO TABS: 650.0000 mg | ORAL_TABLET | ORAL | Status: DC | PRN

## 2023-04-18 MED ORDER — GABAPENTIN 400 MG PO CAPS
800.0000 mg | ORAL_CAPSULE | Freq: Three times a day (TID) | ORAL | Status: DC
  Administered 2023-04-18 – 2023-04-20 (×4): 800 mg via ORAL
  Filled 2023-04-18 (×5): qty 2

## 2023-04-18 MED ORDER — INSULIN ASPART 100 UNIT/ML IJ SOLN
0.0000 [IU] | Freq: Three times a day (TID) | INTRAMUSCULAR | Status: DC
  Administered 2023-04-19: 3 [IU] via SUBCUTANEOUS
  Filled 2023-04-18 (×3): qty 1

## 2023-04-18 MED ORDER — INSULIN ASPART 100 UNIT/ML IJ SOLN: 0.0000 [IU] | Freq: Every day | INTRAMUSCULAR | Status: DC

## 2023-04-18 MED ORDER — MORPHINE SULFATE (PF) 4 MG/ML IV SOLN
4.0000 mg | Freq: Once | INTRAVENOUS | Status: AC
  Administered 2023-04-18: 4 mg via INTRAVENOUS
  Filled 2023-04-18: qty 1

## 2023-04-18 MED ORDER — CLOPIDOGREL BISULFATE 75 MG PO TABS
75.0000 mg | ORAL_TABLET | Freq: Every day | ORAL | Status: DC
  Administered 2023-04-18 – 2023-04-20 (×2): 75 mg via ORAL
  Filled 2023-04-18 (×2): qty 1

## 2023-04-18 MED ORDER — SODIUM CHLORIDE 0.9 % IV BOLUS (SEPSIS)
1000.0000 mL | Freq: Once | INTRAVENOUS | Status: AC
  Administered 2023-04-18: 1000 mL via INTRAVENOUS

## 2023-04-18 MED ORDER — SODIUM CHLORIDE 0.9 % IV SOLN
2.0000 g | Freq: Once | INTRAVENOUS | Status: AC
  Administered 2023-04-18: 2 g via INTRAVENOUS
  Filled 2023-04-18: qty 20

## 2023-04-18 MED ORDER — OXYCODONE-ACETAMINOPHEN 10-325 MG PO TABS: 2.0000 | ORAL_TABLET | Freq: Four times a day (QID) | ORAL | Status: DC | PRN

## 2023-04-18 MED ORDER — ATORVASTATIN CALCIUM 20 MG PO TABS
80.0000 mg | ORAL_TABLET | Freq: Every day | ORAL | Status: DC
  Administered 2023-04-18 – 2023-04-19 (×2): 80 mg via ORAL
  Filled 2023-04-18 (×2): qty 4

## 2023-04-18 MED ORDER — DOCUSATE SODIUM 100 MG PO CAPS: 100.0000 mg | ORAL_CAPSULE | Freq: Two times a day (BID) | ORAL | Status: DC | PRN

## 2023-04-18 MED ORDER — OXYCODONE HCL 5 MG PO TABS
20.0000 mg | ORAL_TABLET | Freq: Four times a day (QID) | ORAL | Status: DC | PRN
  Administered 2023-04-18 – 2023-04-19 (×2): 20 mg via ORAL
  Filled 2023-04-18 (×2): qty 4

## 2023-04-18 MED ORDER — ACETAMINOPHEN 325 MG PO TABS: 650.0000 mg | ORAL_TABLET | Freq: Four times a day (QID) | ORAL | Status: DC | PRN

## 2023-04-18 MED ORDER — ENOXAPARIN SODIUM 30 MG/0.3ML IJ SOSY: 30.0000 mg | PREFILLED_SYRINGE | INTRAMUSCULAR | Status: DC

## 2023-04-18 MED ORDER — PIPERACILLIN-TAZOBACTAM 3.375 G IVPB
3.3750 g | Freq: Three times a day (TID) | INTRAVENOUS | Status: DC
  Administered 2023-04-18 – 2023-04-19 (×2): 3.375 g via INTRAVENOUS
  Filled 2023-04-18 (×2): qty 50

## 2023-04-18 MED ORDER — SODIUM CHLORIDE 0.9 % IV BOLUS
1000.0000 mL | Freq: Once | INTRAVENOUS | Status: AC
  Administered 2023-04-18: 1000 mL via INTRAVENOUS

## 2023-04-18 MED ORDER — LORAZEPAM 2 MG/ML IJ SOLN: 0.5000 mg | Freq: Once | INTRAMUSCULAR | Status: DC | PRN

## 2023-04-18 MED ORDER — SODIUM CHLORIDE 0.9 % IV SOLN: INTRAVENOUS | Status: AC

## 2023-04-18 MED ORDER — ONDANSETRON HCL 4 MG/2ML IJ SOLN: 4.0000 mg | Freq: Four times a day (QID) | INTRAMUSCULAR | Status: DC | PRN

## 2023-04-18 MED ORDER — VITAMIN D 25 MCG (1000 UNIT) PO TABS
1000.0000 [IU] | ORAL_TABLET | Freq: Every day | ORAL | Status: DC
  Administered 2023-04-18 – 2023-04-20 (×3): 1000 [IU] via ORAL
  Filled 2023-04-18 (×3): qty 1

## 2023-04-18 MED ORDER — VITAMIN B-12 1000 MCG PO TABS
1000.0000 ug | ORAL_TABLET | Freq: Every day | ORAL | Status: DC
  Administered 2023-04-18 – 2023-04-20 (×3): 1000 ug via ORAL
  Filled 2023-04-18 (×3): qty 1

## 2023-04-18 NOTE — ED Notes (Signed)
Pt settled in with urinal, call bell, and TV remote within reach.

## 2023-04-18 NOTE — ED Triage Notes (Signed)
Pt via POV from home. Pt had a R pinky toe amputation 3 weeks ago. Pt went back to his podiatrist for a follow up and they sent him over here for possible infection. States he did have some cold chills unknown if he had a fever. States he has generalized body aches. Pt is diabetic. Pt is A&OX4 and NAD

## 2023-04-18 NOTE — Assessment & Plan Note (Signed)
Patient has about a 4 cm distance from wound cellulitis of the right foot.  Patient has received vancomycin and ceftriaxone in the ER.  We will continue with Zosyn.  Obviously we need to rule out any deeper infection of this postsurgical diabetic foot.  An MRI is pending and we will follow-up accordingly.  At this time no evidence of fever or white count and if MRI is negative I would favor further de-escalation of antibiotic therapy.

## 2023-04-18 NOTE — Assessment & Plan Note (Addendum)
In a patient with ongoing infection, Lasix use, AKI.  I suspect patient was hypovolemic especially due to lasix use and has a great response to IV fluids.  At this time resolved . However we will check a troponin, may keep the patient on telemetry till then and monitor the patient clinically with intake and output.  Trend creatinine.  Check a bladder scan.  Restart antihypertensive agents in the morning

## 2023-04-18 NOTE — H&P (Signed)
History and Physical    Patient: Kenneth Duarte ZOX:[id number] DOB: [birth year] DOA: 04/18/2023 DOS: the patient was seen and examined on 04/18/2023 PCP: Emogene Morgan, MD  Patient coming from: Home  Chief Complaint:  Chief Complaint  Patient presents with   Wound Infection   HPI: Kenneth Duarte is a 77 y.o. 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  Unfortunately patient has had a persistent wound at the site with some discharge since then.  Patient has neuropathy and therefore does not really complain of any pain.  For the last 24 hours patient has noted some erythema about the size of his close fist around the wound.  Since today patient has been having chills.  And also patient was having presyncope like dizziness when he would get up from a sitting down or laying down position.  There is no report of any fever or rigors.  No vomiting no diarrhea no chest pain or shortness of breath no other rash in skin.  Patient had a routine appointment with podiatry today, who evaluated the wound and advised the patient to come to the ER for an MRI.  No change in urinary habits  ER course is notable for hypotension of 90/50 inpatient.  With good response to IV fluids.  Review of Systems: As mentioned in the history of present illness. All other systems reviewed and are negative. Past Medical History:  Diagnosis Date   Arthritis    Chronic pain syndrome    Congestive heart failure (HCC) 1980   Coronary artery disease    a. s/p 3V CABG (03/2017). b. NSTEMI 06/2017 with progressive graft disease, s/p DES to SVG-OM.   Diabetes (HCC)    DIET   Dyspnea    with exertion   ED (erectile dysfunction)    GERD (gastroesophageal reflux disease)    Gout    Headache    Hyperlipemia    Hypertension    CONTROLLED ON MEDS   IDA (iron deficiency anemia) 10/21/2019   Neuropathy of both feet    Seborrheic keratosis    Sinus congestion    Vertigo    Wears  dentures    PARTIAL UPPER   Past Surgical History:  Procedure Laterality Date   COLONOSCOPY WITH PROPOFOL N/A 10/25/2015   Procedure: COLONOSCOPY WITH PROPOFOL;  Surgeon: Midge Minium, MD;  Location: [facility];  Service: Endoscopy;  Laterality: N/A;  DIABETIC-ORAL MEDS   COLONOSCOPY WITH PROPOFOL N/A 10/22/2020   Procedure: COLONOSCOPY CANCELED;  Surgeon: Midge Minium, MD;  Location: [facility];  Service: Endoscopy;  Laterality: N/A;  procedure aborted poor prep   COLONOSCOPY WITH PROPOFOL N/A 10/25/2020   Procedure: COLONOSCOPY WITH PROPOFOL;  Surgeon: Midge Minium, MD;  Location: [facility];  Service: Endoscopy;  Laterality: N/A;   CORONARY ARTERY BYPASS GRAFT N/A 04/10/2017   Procedure: CORONARY ARTERY BYPASS GRAFTING (CABG) x three , using left internal mammary artery and right leg greater saphenous vein harvested endoscopically;  Surgeon: Kerin Perna, MD;  Location: [facility] OR;  Service: Open Heart Surgery;  Laterality: N/A;   CORONARY ARTERY BYPASS GRAFT     CORONARY STENT INTERVENTION N/A 06/19/2017   Procedure: Coronary Stent Intervention;  Surgeon: Corky Crafts, MD;  Location: [facility] INVASIVE CV LAB;  Service: Cardiovascular;  Laterality: N/A;   ESOPHAGOGASTRODUODENOSCOPY (EGD) WITH PROPOFOL N/A 10/22/2020   Procedure: ESOPHAGOGASTRODUODENOSCOPY (EGD) WITH PROPOFOL;  Surgeon: Midge Minium, MD;  Location: [facility];  Service: Endoscopy;  Laterality: N/A;  Diabetic - oral meds   EXCISION PARTIAL PHALANX Right 03/22/2023   Procedure: EXCISION  PHALANX;  Surgeon: Rosetta Posner, DPM;  Location: [facility] ORS;  Service: Podiatry;  Laterality: Right;   IRRIGATION AND DEBRIDEMENT FOOT Right 03/22/2023   Procedure: IRRIGATION AND DEBRIDEMENT FOOT;  Surgeon: Rosetta Posner, DPM;  Location: [facility] ORS;  Service: Podiatry;  Laterality: Right;   KNEE SURGERY Right    over 20 years ago   LEFT HEART CATH AND CORONARY ANGIOGRAPHY Left 02/22/2017   Procedure: Left Heart Cath and  Coronary Angiography;  Surgeon: Lamar Blinks, MD;  Location: [facility] INVASIVE CV LAB;  Service: Cardiovascular;  Laterality: Left;   LEFT HEART CATH AND CORS/GRAFTS ANGIOGRAPHY N/A 06/18/2017   Procedure: Left Heart Cath and Cors/Grafts Angiography and PCI;  Surgeon: Marcina Millard, MD;  Location: [facility] INVASIVE CV LAB;  Service: Cardiovascular;  Laterality: N/A;   LOWER EXTREMITY ANGIOGRAPHY Right 03/23/2023   Procedure: Lower Extremity Angiography;  Surgeon: Annice Needy, MD;  Location: [facility] INVASIVE CV LAB;  Service: Cardiovascular;  Laterality: Right;   LOWER EXTREMITY INTERVENTION  03/23/2023   Procedure: LOWER EXTREMITY INTERVENTION;  Surgeon: Annice Needy, MD;  Location: [facility] INVASIVE CV LAB;  Service: Cardiovascular;;   POLYPECTOMY  10/25/2015   Procedure: POLYPECTOMY;  Surgeon: Midge Minium, MD;  Location: [facility];  Service: Endoscopy;;   ROTATOR CUFF REPAIR Right 2012   TEE WITHOUT CARDIOVERSION N/A 04/10/2017   Procedure: TRANSESOPHAGEAL ECHOCARDIOGRAM (TEE);  Surgeon: Kerin Perna, MD;  Location: [facility] OR;  Service: Open Heart Surgery;  Laterality: N/A;   Social History:  reports that he quit smoking about 43 years ago. His smoking use included cigarettes. He has a 30.00 pack-year smoking history. He has never used smokeless tobacco. He reports that he does not drink alcohol and does not use drugs.  Allergies  Allergen Reactions   Blood-Group Specific Substance Other (See Comments)    Patient received almost 2 units FFP during a procedure. Became hypotensive and developed rash   Cortisone Acetate [Cortisone] Rash and Other (See Comments)    Family History  Problem Relation Age of Onset   Cancer Father    Heart disease Father    Heart attack Father    Cancer Mother    Heart disease Brother    Diabetes Brother    Prostate cancer Brother    Diabetes Sister    Bladder Cancer Neg Hx    Kidney disease Neg Hx     Prior to Admission medications   Medication Sig Start  Date End Date Taking? Authorizing Provider  acetaminophen (TYLENOL) 500 MG tablet Take 2 tablets (1,000 mg total) by mouth every 6 (six) hours as needed. 04/15/17  Yes Barrett, Erin R, PA-C  amlodipine-benazepril (LOTREL) 2.5-10 MG capsule Take 1 capsule by mouth daily. 01/17/21  Yes [provider]  aspirin 81 MG tablet Take 1 tablet (81 mg total) by mouth daily. 06/20/17  Yes Dunn, Dayna N, PA-C  atorvastatin (LIPITOR) 80 MG tablet TAKE 1 TABLET AT BEDTIME 05/17/22  Yes Gollan, Tollie Pizza, MD  carvedilol (COREG) 12.5 MG tablet TAKE 1 TABLET TWICE DAILY WITH MEALS 04/09/23  Yes Gollan, Tollie Pizza, MD  clopidogrel (PLAVIX) 75 MG tablet TAKE 1 TABLET EVERY DAY 04/09/23  Yes Gollan, Tollie Pizza, MD  docusate sodium (COLACE) 100 MG capsule Take 100 mg by mouth 2 (two) times daily as needed.    Yes [provider]  FEROSUL 325 (65 Fe)  MG tablet TAKE 1 TABLET EVERY DAY 01/01/20  Yes Rickard Patience, MD  furosemide (LASIX) 20 MG tablet TAKE 1 TABLET EVERY DAY 08/02/20  Yes Gollan, Tollie Pizza, MD  gabapentin (NEURONTIN) 800 MG tablet Take 1,600 mg by mouth 3 (three) times daily.   Yes [provider]  meclizine (ANTIVERT) 25 MG tablet TAKE 1 TABLET TWICE DAILY AS NEEDED FOR DIZZINESS 06/27/22  Yes Gollan, Tollie Pizza, MD  metFORMIN (GLUCOPHAGE-XR) 500 MG 24 hr tablet Take 500 mg by mouth 2 (two) times daily.  05/29/17  Yes [provider]  NARCAN 4 MG/0.1ML LIQD nasal spray kit 1 spray once. 02/24/22  Yes [provider]  nitroGLYCERIN (NITROSTAT) 0.4 MG SL tablet Place 1 tablet (0.4 mg total) under the tongue every 5 (five) minutes as needed for chest pain. 05/11/20  Yes Gollan, Tollie Pizza, MD  oxyCODONE-acetaminophen (PERCOCET) 10-325 MG tablet Take 2 tablets by mouth every 6 (six) hours as needed for pain.   Yes [provider]  pantoprazole (PROTONIX) 40 MG tablet TAKE 1 TABLET EVERY DAY 06/27/22  Yes Gollan, Tollie Pizza, MD  valACYclovir (VALTREX) 1000 MG tablet Take 1,000 mg by  mouth as needed. 01/06/20  Yes [provider]  vitamin B-12 (CYANOCOBALAMIN) 1000 MCG tablet  09/17/19  Yes [provider]  VITAMIN D-1000 MAX ST 25 MCG (1000 UT) tablet Take 1,000 Units by mouth daily. 07/22/20  Yes [provider]  ACCU-CHEK AVIVA PLUS test strip  11/20/19   [provider]  Accu-Chek Softclix Lancets lancets  11/20/19   [provider]  albuterol (PROVENTIL HFA;VENTOLIN HFA) 108 (90 Base) MCG/ACT inhaler Inhale into the lungs. Patient not taking: Reported on 03/20/2023    [provider]  Alcohol Swabs (B-D SINGLE USE SWABS REGULAR) PADS  11/17/19   [provider]  fluticasone (FLONASE) 50 MCG/ACT nasal spray Place into the nose. Patient not taking: Reported on 03/20/2023    [provider]    Physical Exam: Vitals:   04/18/23 1830 04/18/23 1900 04/18/23 1930 04/18/23 2038  BP: 120/63 108/85 132/73 (!) 130/59  Pulse: 74 81 76 76  Resp: 18 16 13 16   Temp:  97.7 F (36.5 C)    TempSrc:  Oral    SpO2:  95%  100%  Weight:      Height:       General: Patient is alert and awake he does not appear to be in any distress.  Gives the history himself and is coherent Respiratory exam: Bilateral intravesicular Cardiovascular exam S1-S2 normal Abdomen soft nontender Extremities warm without edema. Right foot is closely examined.  There is a surgical appearing wound at the base of where the right pinky toe would have been.  The right pinky toe has been amputated.  Similarly there is another dehisced wound at the proximal end of I believe the fifth metatarsal.  There is no foul smell, there is minimal swelling of the site.  I could not express any purulence.  Please see photo for reference.  There is some erythema surrounding the site at approximately 4 cm in a circumferential manner.   Media Information  Document Information  Photos  Right foot cellulitis  04/18/2023 20:29  Attached To:  Hospital Encounter  on 04/18/23  Source Information  Nolberto Hanlon, MD  [facility]   Data Reviewed:  Labs on Admission:  Results for orders placed or performed during the hospital encounter of 04/18/23 (from the past 24 hour(s))  Comprehensive metabolic panel  Status: Abnormal   Collection Time: 04/18/23  3:21 PM  Result Value Ref Range   Sodium 135 135 - 145 mmol/L   Potassium 4.4 3.5 - 5.1 mmol/L   Chloride 102 98 - 111 mmol/L   CO2 22 22 - 32 mmol/L   Glucose, Bld 129 (H) 70 - 99 mg/dL   BUN 26 (H) 8 - 23 mg/dL   Creatinine, Ser 1.61 (H) 0.61 - 1.24 mg/dL   Calcium 8.2 (L) 8.9 - 10.3 mg/dL   Total Protein 7.3 6.5 - 8.1 g/dL   Albumin 3.5 3.5 - 5.0 g/dL   AST 22 15 - 41 U/L   ALT 18 0 - 44 U/L   Alkaline Phosphatase 59 38 - 126 U/L   Total Bilirubin 1.2 0.3 - 1.2 mg/dL   GFR, Estimated 30 (L) >60 mL/min   Anion gap 11 5 - 15  Lactic acid, plasma     Status: None   Collection Time: 04/18/23  3:21 PM  Result Value Ref Range   Lactic Acid, Venous 1.7 0.5 - 1.9 mmol/L  CBC with Differential     Status: Abnormal   Collection Time: 04/18/23  3:21 PM  Result Value Ref Range   WBC 9.2 4.0 - 10.5 K/uL   RBC 3.75 (L) 4.22 - 5.81 MIL/uL   Hemoglobin 11.1 (L) 13.0 - 17.0 g/dL   HCT 09.6 (L) 04.5 - 40.9 %   MCV 88.5 80.0 - 100.0 fL   MCH 29.6 26.0 - 34.0 pg   MCHC 33.4 30.0 - 36.0 g/dL   RDW 81.1 (H) 91.4 - 78.2 %   Platelets 157 150 - 400 K/uL   nRBC 0.0 0.0 - 0.2 %   Neutrophils Relative % 77 %   Neutro Abs 7.1 1.7 - 7.7 K/uL   Lymphocytes Relative 16 %   Lymphs Abs 1.5 0.7 - 4.0 K/uL   Monocytes Relative 7 %   Monocytes Absolute 0.6 0.1 - 1.0 K/uL   Eosinophils Relative 0 %   Eosinophils Absolute 0.0 0.0 - 0.5 K/uL   Basophils Relative 0 %   Basophils Absolute 0.0 0.0 - 0.1 K/uL   Immature Granulocytes 0 %   Abs Immature Granulocytes 0.03 0.00 - 0.07 K/uL  Protime-INR     Status: None   Collection Time: 04/18/23  3:21 PM  Result Value Ref Range   Prothrombin Time 14.9  11.4 - 15.2 seconds   INR 1.2 0.8 - 1.2  Lactic acid, plasma     Status: None   Collection Time: 04/18/23  6:19 PM  Result Value Ref Range   Lactic Acid, Venous 1.7 0.5 - 1.9 mmol/L  Urinalysis, Routine w reflex microscopic -Urine, Clean Catch     Status: Abnormal   Collection Time: 04/18/23  6:19 PM  Result Value Ref Range   Color, Urine YELLOW (A) YELLOW   APPearance CLEAR (A) CLEAR   Specific Gravity, Urine 1.010 1.005 - 1.030   pH 5.0 5.0 - 8.0   Glucose, UA NEGATIVE NEGATIVE mg/dL   Hgb urine dipstick NEGATIVE NEGATIVE   Bilirubin Urine NEGATIVE NEGATIVE   Ketones, ur NEGATIVE NEGATIVE mg/dL   Protein, ur NEGATIVE NEGATIVE mg/dL   Nitrite NEGATIVE NEGATIVE   Leukocytes,Ua NEGATIVE NEGATIVE    Radiological Exams on Admission:  No results found. MRI of foot pending. EKG: Pending.   Assessment and Plan: * Hypotension In a patient with ongoing infection, Lasix use, AKI.  I suspect patient was hypovolemic especially due to lasix use  and has a great response to IV fluids.  At this time resolved . However we will check a troponin, may keep the patient on telemetry till then and monitor the patient clinically with intake and output.  Trend creatinine.  Check a bladder scan.  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  Patient has received vancomycin and ceftriaxone in the ER.  We will continue with Zosyn.  Obviously we need to rule out any deeper infection of this postsurgical diabetic foot.  An MRI is pending and we will follow-up accordingly.  At this time no evidence of fever or white count and if MRI is negative I would favor further de-escalation of antibiotic therapy.      Advance Care Planning:   Code Status: Full Code   Consults: Podiatry will evaluate patient in the morning  Family Communication: As per patient  Severity of Illness: The appropriate  patient status for this patient is OBSERVATION. Observation status is judged to be reasonable and necessary in order to provide the required intensity of service to ensure the patient's safety. The patient's presenting symptoms, physical exam findings, and initial radiographic and laboratory data in the context of their medical condition is felt to place them at decreased risk for further clinical deterioration. Furthermore, it is anticipated that the patient will be medically stable for discharge from the hospital within 2 midnights of admission.   Author: Nolberto Hanlon, MD 04/18/2023 8:49 PM  For on call review [email or web address].

## 2023-04-18 NOTE — ED Notes (Signed)
Pt transported to MRI and MRI will call for transport to the room when complete.

## 2023-04-18 NOTE — ED Notes (Signed)
MD at bedside. 

## 2023-04-18 NOTE — Consult Note (Signed)
Pharmacy Antibiotic Note  Kenneth Duarte is a 77 y.o. male admitted on 04/18/2023 with  wound infection .  Pharmacy has been consulted for zosyn dosing.  Plan: Zosyn 3.375g IV q8h (4 hour infusion).  Height: 5\' 11"  (180.3 cm) Weight: 86.2 kg (190 lb) IBW/kg (Calculated) : 75.3  Temp (24hrs), Avg:97.8 F (36.6 C), Min:97.7 F (36.5 C), Max:97.9 F (36.6 C)  Recent Labs  Lab 04/18/23 1521 04/18/23 1819  WBC 9.2  --   CREATININE 2.24*  --   LATICACIDVEN 1.7 1.7    Estimated Creatinine Clearance: 30.8 mL/min (A) (by C-G formula based on SCr of 2.24 mg/dL (H)).    Allergies  Allergen Reactions   Blood-Group Specific Substance Other (See Comments)    Patient received almost 2 units FFP during a procedure. Became hypotensive and developed rash   Cortisone Acetate [Cortisone] Rash and Other (See Comments)    Thank you for allowing pharmacy to be a part of this patient's care.  Selinda Eon 04/18/2023 9:00 PM

## 2023-04-18 NOTE — Plan of Care (Incomplete)
Patient A&Ox4, from home, independent in room

## 2023-04-18 NOTE — ED Notes (Signed)
Alvino Chapel, RN aware that patient is coming to unit after MRI.

## 2023-04-18 NOTE — Consult Note (Signed)
PHARMACY -  BRIEF ANTIBIOTIC NOTE   Pharmacy has received consult(s) for Vancomycin from an ED provider.  The patient's profile has been reviewed for ht/wt/allergies/indication/available labs.    One time order(s) placed for Vancomycin 1750 mg   Further antibiotics/pharmacy consults should be ordered by admitting physician if indicated.                       Thank you, Sharen Hones, PharmD, BCPS Clinical Pharmacist   04/18/2023  4:57 PM

## 2023-04-18 NOTE — ED Notes (Signed)
Called for transport for patient. 

## 2023-04-18 NOTE — ED Notes (Signed)
EKG given to ED provider to be signed off on. Admitting provider messaged to inform that EKG is completed and in patients chart.

## 2023-04-18 NOTE — Code Documentation (Signed)
CODE SEPSIS - PHARMACY COMMUNICATION  **Broad Spectrum Antibiotics should be administered within 1 hour of Sepsis diagnosis**  Time Code Sepsis Called/Page Received: 1608  Antibiotics Ordered: ceftriaxone, vancomycin  Time of 1st antibiotic administration: 1634  Additional action taken by pharmacy:   If necessary, Name of Provider/Nurse Contacted:     Sharen Hones ,PharmD Clinical Pharmacist  04/18/2023  4:56 PM

## 2023-04-18 NOTE — Progress Notes (Signed)
Subjective:    Patient ID: Kenneth Duarte, male    DOB: Mar 16, 1946, 77 y.o.   MRN: [id number] Chief Complaint  Patient presents with   Follow-up    [facility] 3-4 week with ABI    The patient returns to the office for followup and review status post angiogram with intervention on 03/23/2023.   Procedure: Procedure(s) Performed:             1.  Ultrasound guidance for vascular access left femoral artery             2.  Catheter placement into right common femoral artery from left femoral approach             3.  Aortogram and selective right lower extremity angiogram             4.  Percutaneous transluminal angioplasty of right anterior tibial artery both proximally and distally with 3 mm diameter by 10 cm length angioplasty balloons             5.  Percutaneous transluminal angioplasty of the right mid to distal SFA and popliteal artery with 6 mm diameter by 22 cm length Lutonix drug-coated angioplasty balloon             6.  Stent placement to the right mid to distal SFA with 7 mm diameter by 10 cm length life stent             7.  StarClose closure device left femoral artery   The patient originally had an angiogram after noticing a wound.  The wound is drastically decreased in size it is nearly healed at this time.  The patient denies any claudication symptoms or new ulcerations.  There have been no significant changes to the patient's overall health care.  No documented history of amaurosis fugax or recent TIA symptoms. There are no recent neurological changes noted. No documented history of DVT, PE or superficial thrombophlebitis. The patient denies recent episodes of angina or shortness of breath.   ABI's Rt=[place] and Lt=0.95  (no previous) Duplex US of the bilateral tibial vessels reveals biphasic/triphasic waveforms with fairly patent waveforms bilaterally.    Review of Systems  Skin:  Positive for wound.       Objective:   Physical Exam Vitals reviewed.  HENT:      Head: Normocephalic.  Cardiovascular:     Rate and Rhythm: Normal rate.     Pulses:          Dorsalis pedis pulses are detected w/ Doppler on the right side and detected w/ Doppler on the left side.       Posterior tibial pulses are detected w/ Doppler on the right side and detected w/ Doppler on the left side.  Pulmonary:     Effort: Pulmonary effort is normal.  Skin:    General: Skin is warm and dry.  Neurological:     Mental Status: He is alert and oriented to person, place, and time.  Psychiatric:        Mood and Affect: Mood normal.        Behavior: Behavior normal.        Thought Content: Thought content normal.        Judgment: Judgment normal.     BP 136/68 (BP Location: Left Arm)   Pulse 69   Resp 16   Wt 189 lb 12.8 oz (86.1 kg)   BMI 26.47 kg/m   Past Medical History:  Diagnosis  Date   Arthritis    Chronic pain syndrome    Congestive heart failure (HCC) 1980   Coronary artery disease    a. s/p 3V CABG (03/2017). b. NSTEMI 06/2017 with progressive graft disease, s/p DES to SVG-OM.   Diabetes (HCC)    DIET   Dyspnea    with exertion   ED (erectile dysfunction)    GERD (gastroesophageal reflux disease)    Gout    Headache    Hyperlipemia    Hypertension    CONTROLLED ON MEDS   IDA (iron deficiency anemia) 10/21/2019   Neuropathy of both feet    Seborrheic keratosis    Sinus congestion    Vertigo    Wears dentures    PARTIAL UPPER    Social History   Socioeconomic History   Marital status: Married    Spouse name: Not on file   Number of children: Not on file   Years of education: Not on file   Highest education level: Not on file  Occupational History   Not on file  Tobacco Use   Smoking status: Former    Packs/day: 2.00    Years: 15.00    Additional pack years: 0.00    Total pack years: 30.00    Types: Cigarettes    Quit date: 06/30/1979    Years since quitting: 43.8   Smokeless tobacco: Never  Vaping Use   Vaping Use: Never used   Substance and Sexual Activity   Alcohol use: No    Alcohol/week: 0.0 standard drinks of alcohol    Comment: QUIT IN 1980   Drug use: No   Sexual activity: Not on file  Other Topics Concern   Not on file  Social History Narrative   Not on file   Social Determinants of Health   Financial Resource Strain: Not on file  Food Insecurity: Not on file  Transportation Needs: Not on file  Physical Activity: Not on file  Stress: Not on file  Social Connections: Not on file  Intimate Partner Violence: Not on file    Past Surgical History:  Procedure Laterality Date   COLONOSCOPY WITH PROPOFOL N/A 10/25/2015   Procedure: COLONOSCOPY WITH PROPOFOL;  Surgeon: Midge Minium, MD;  Location: [facility];  Service: Endoscopy;  Laterality: N/A;  DIABETIC-ORAL MEDS   COLONOSCOPY WITH PROPOFOL N/A 10/22/2020   Procedure: COLONOSCOPY CANCELED;  Surgeon: Midge Minium, MD;  Location: [facility];  Service: Endoscopy;  Laterality: N/A;  procedure aborted poor prep   COLONOSCOPY WITH PROPOFOL N/A 10/25/2020   Procedure: COLONOSCOPY WITH PROPOFOL;  Surgeon: Midge Minium, MD;  Location: [facility];  Service: Endoscopy;  Laterality: N/A;   CORONARY ARTERY BYPASS GRAFT N/A 04/10/2017   Procedure: CORONARY ARTERY BYPASS GRAFTING (CABG) x three , using left internal mammary artery and right leg greater saphenous vein harvested endoscopically;  Surgeon: Kerin Perna, MD;  Location: [facility] OR;  Service: Open Heart Surgery;  Laterality: N/A;   CORONARY ARTERY BYPASS GRAFT     CORONARY STENT INTERVENTION N/A 06/19/2017   Procedure: Coronary Stent Intervention;  Surgeon: Corky Crafts, MD;  Location: [facility] INVASIVE CV LAB;  Service: Cardiovascular;  Laterality: N/A;   ESOPHAGOGASTRODUODENOSCOPY (EGD) WITH PROPOFOL N/A 10/22/2020   Procedure: ESOPHAGOGASTRODUODENOSCOPY (EGD) WITH PROPOFOL;  Surgeon: Midge Minium, MD;  Location: [facility];  Service: Endoscopy;  Laterality: N/A;  Diabetic -  oral meds   EXCISION PARTIAL PHALANX Right 03/22/2023   Procedure: EXCISION  PHALANX;  Surgeon: Excell Seltzer,  Greig Castilla, DPM;  Location: [facility] ORS;  Service: Podiatry;  Laterality: Right;   IRRIGATION AND DEBRIDEMENT FOOT Right 03/22/2023   Procedure: IRRIGATION AND DEBRIDEMENT FOOT;  Surgeon: Rosetta Posner, DPM;  Location: [facility];  Service: Podiatry;  Laterality: Right;   KNEE SURGERY Right    over 20 years ago   LEFT HEART CATH AND CORONARY ANGIOGRAPHY Left 02/22/2017   Procedure: Left Heart Cath and Coronary Angiography;  Surgeon: Lamar Blinks, MD;  Location: [facility] INVASIVE CV LAB;  Service: Cardiovascular;  Laterality: Left;   LEFT HEART CATH AND CORS/GRAFTS ANGIOGRAPHY N/A 06/18/2017   Procedure: Left Heart Cath and Cors/Grafts Angiography and PCI;  Surgeon: Marcina Millard, MD;  Location: [facility] INVASIVE CV LAB;  Service: Cardiovascular;  Laterality: N/A;   LOWER EXTREMITY ANGIOGRAPHY Right 03/23/2023   Procedure: Lower Extremity Angiography;  Surgeon: Annice Needy, MD;  Location: [facility] INVAS[facility] CV LAB;  Service: Cardiovascular;  Laterality: Right;   LOWER EXTREMITY INTERVENTION  03/23/2023   Procedure: LOWER EXTREMITY INTERVENTION;  Surgeon: Annice Needy, MD;  Location: [facility] INVAS[facility] CV LAB;  Service: Cardiovascular;;   POLYPECTOMY  10/25/2015   Procedure: POLYPECTOMY;  Surgeon: Midge Minium, MD;  Location: [facility];  Service: Endoscopy;;   ROTATOR CUFF REPAIR Right 2012   TEE WITHOUT CARDIOVERSION N/A 04/10/2017   Procedure: TRANSESOPHAGEAL ECHOCARDIOGRAM (TEE);  Surgeon: Kerin Perna, MD;  Location: [facility] OR;  Service: Open Heart Surgery;  Laterality: N/A;    Family History  Problem Relation Age of Onset   Cancer Father    Heart disease Father    Heart attack Father    Cancer Mother    Heart disease Brother    Diabetes Brother    Prostate cancer Brother    Diabetes Sister    Bladder Cancer Neg Hx    Kidney disease Neg Hx     Allergies  Allergen Reactions   Blood-Group Specific  Substance Other (See Comments)    Patient received almost 2 units FFP during a procedure. Became hypotensive and developed rash   Cortisone Acetate [Cortisone] Rash and Other (See Comments)       Latest Ref Rng & Units 03/23/2023    5:30 AM 03/22/2023    6:07 AM 03/21/2023    5:46 AM  CBC  WBC 4.0 - 10.5 K/uL 5.6  5.9  7.8   Hemoglobin 13.0 - 17.0 g/dL 9.9  16.1  9.9   Hematocrit 39.0 - 52.0 % 30.1  31.6  29.9   Platelets 150 - 400 K/uL 160  174  140       CMP     Component Value Date/Time   NA 140 03/23/2023 0530   NA 137 03/06/2014 1708   K 4.2 03/23/2023 0530   K 3.8 03/06/2014 1708   CL 110 03/23/2023 0530   CL 106 03/06/2014 1708   CO2 23 03/23/2023 0530   CO2 29 03/06/2014 1708   GLUCOSE 181 (H) 03/23/2023 0530   GLUCOSE 139 (H) 03/06/2014 1708   BUN 11 03/23/2023 0530   BUN 8 03/06/2014 1708   CREATININE 1.08 03/23/2023 0530   CREATININE 0.96 03/06/2014 1708   CALCIUM 8.3 (L) 03/23/2023 0530   CALCIUM 8.6 03/06/2014 1708   PROT 6.2 (L) 03/21/2023 0546   PROT 7.6 03/06/2014 1708   ALBUMIN 3.0 (L) 03/21/2023 0546   ALBUMIN 3.8 03/06/2014 1708   AST 13 (L) 03/21/2023 0546   AST 21 03/06/2014 1708   ALT 15 03/21/2023 0546   ALT 26  03/06/2014 1708   ALKPHOS 51 03/21/2023 0546   ALKPHOS 79 03/06/2014 1708   BILITOT 0.9 03/21/2023 0546   BILITOT 0.4 03/06/2014 1708   GFRNONAA >60 03/23/2023 0530   GFRNONAA >60 03/06/2014 1708   GFRAA >60 11/10/2019 0920   GFRAA >60 03/06/2014 1708     No results found.     Assessment & Plan:   1. PAD (peripheral artery disease) (HCC) Recommend:  The patient is status post successful angiogram with intervention.  The patient reports that wound is continuing to heal well..   The patient denies lifestyle limiting changes at this point in time.  No further invasive studies, angiography or surgery at this time The patient should continue walking and begin a more formal exercise program.  The patient should continue  antiplatelet therapy and aggressive treatment of the lipid abnormalities  Continued surveillance is indicated as atherosclerosis is likely to progress with time.    Patient should undergo noninvasive studies as ordered. The patient will follow up with me to review the studies.   2. Benign essential HTN Continue antihypertensive medications as already ordered, these medications have been reviewed and there are no changes at this time.  3. Type 2 diabetes mellitus with other circulatory complication, without long-term current use of insulin (HCC) Continue hypoglycemic medications as already ordered, these medications have been reviewed and there are no changes at this time.  Hgb A1C to be monitored as already arranged by primary service   Current Outpatient Medications on File Prior to Visit  Medication Sig Dispense Refill   acetaminophen (TYLENOL) 500 MG tablet Take 2 tablets (1,000 mg total) by mouth every 6 (six) hours as needed. 30 tablet 0   Alcohol Swabs (B-D SINGLE USE SWABS REGULAR) PADS      amlodipine-benazepril (LOTREL) 2.5-10 MG capsule Take 1 capsule by mouth daily.     aspirin 81 MG tablet Take 1 tablet (81 mg total) by mouth daily. 90 tablet 1   atorvastatin (LIPITOR) 80 MG tablet TAKE 1 TABLET AT BEDTIME 90 tablet 3   carvedilol (COREG) 12.5 MG tablet TAKE 1 TABLET TWICE DAILY WITH MEALS 180 tablet 0   clopidogrel (PLAVIX) 75 MG tablet TAKE 1 TABLET EVERY DAY 90 tablet 0   docusate sodium (COLACE) 100 MG capsule Take 100 mg by mouth 2 (two) times daily as needed.      FEROSUL 325 (65 Fe) MG tablet TAKE 1 TABLET EVERY DAY 90 tablet 0   furosemide (LASIX) 20 MG tablet TAKE 1 TABLET EVERY DAY 90 tablet 3   gabapentin (NEURONTIN) 800 MG tablet Take 1,600 mg by mouth 3 (three) times daily.     meclizine (ANTIVERT) 25 MG tablet TAKE 1 TABLET TWICE DAILY AS NEEDED FOR DIZZINESS 180 tablet 3   metFORMIN (GLUCOPHAGE-XR) 500 MG 24 hr tablet Take 500 mg by mouth 2 (two) times daily.       NARCAN 4 MG/0.1ML LIQD nasal spray kit 1 spray once.     nitroGLYCERIN (NITROSTAT) 0.4 MG SL tablet Place 1 tablet (0.4 mg total) under the tongue every 5 (five) minutes as needed for chest pain. 25 tablet 3   pantoprazole (PROTONIX) 40 MG tablet TAKE 1 TABLET EVERY DAY 90 tablet 3   valACYclovir (VALTREX) 1000 MG tablet Take 1,000 mg by mouth as needed.     vitamin B-12 (CYANOCOBALAMIN) 1000 MCG tablet      VITAMIN D-1000 MAX ST 25 MCG (1000 UT) tablet Take 1,000 Units by mouth daily.  ACCU-CHEK AVIVA PLUS test strip  (Patient not taking: Reported on 05/10/2022)     Accu-Chek Softclix Lancets lancets  (Patient not taking: Reported on 05/10/2022)     albuterol (PROVENTIL HFA;VENTOLIN HFA) 108 (90 Base) MCG/ACT inhaler Inhale into the lungs. (Patient not taking: Reported on 03/20/2023)     fluticasone (FLONASE) 50 MCG/ACT nasal spray Place into the nose. (Patient not taking: Reported on 03/20/2023)     No current facility-administered medications on file prior to visit.    There are no Patient Instructions on file for this visit. No follow-ups on file.   Georgiana Spinner, NP

## 2023-04-18 NOTE — ED Provider Notes (Signed)
[facility] Provider Note   Event Date/Time   First MD Initiated Contact with Patient 04/18/23 1558     (approximate) History  Wound Infection  HPI Kenneth Duarte is a 77 y.o. male with a stated past medical history of type 2 diabetes with chronic right foot wound who presents from his podiatrist office complaining of worsening infection to this right foot.  Patient had an amputation previously and the wound is continuing to be difficult to heal.  Patient was seen by Dr. Excell Seltzer earlier today and sent to our emergency department for further evaluation of likely worsening infection.  Patient does endorse 5/10 aching pain overlying his foot that is worse with any pressure. ROS: Patient currently denies any vision changes, tinnitus, difficulty speaking, facial droop, sore throat, chest pain, shortness of breath, abdominal pain, nausea/vomiting/diarrhea, dysuria, or weakness/numbness/paresthesias in any extremity   Physical Exam  Triage Vital Signs: ED Triage Vitals  Enc Vitals Group     BP 04/18/23 1510 (!) 90/50     Pulse Rate 04/18/23 1510 66     Resp 04/18/23 1510 20     Temp 04/18/23 1510 97.9 F (36.6 C)     Temp Source 04/18/23 1510 Oral     SpO2 04/18/23 1510 97 %     Weight 04/18/23 1508 190 lb (86.2 kg)     Height 04/18/23 1508 5\' 11"  (1.803 m)     Head Circumference --      Peak Flow --      Pain Score 04/18/23 1507 5     Pain Loc --      Pain Edu? --      Excl. in GC? --    Most recent vital signs: Vitals:   04/18/23 2038 04/18/23 2130  BP: (!) 130/59 (!) 110/53  Pulse: 76 65  Resp: 16 18  Temp:  98 F (36.7 C)  SpO2: 100% 99%   General: Awake, oriented x4. CV:  Good peripheral perfusion.  Resp:  Normal effort.  Abd:  No distention.  Other:  Partially opens wounds to the lateral right foot just proximal to the amputated region with surrounding erythema and induration ED Results / Procedures / Treatments  Labs (all labs ordered are  listed, but only abnormal results are displayed) Labs Reviewed  COMPREHENSIVE METABOLIC PANEL - Abnormal; Notable for the following components:      Result Value   Glucose, Bld 129 (*)    BUN 26 (*)    Creatinine, Ser 2.24 (*)    Calcium 8.2 (*)    GFR, Estimated 30 (*)    All other components within normal limits  CBC WITH DIFFERENTIAL/PLATELET - Abnormal; Notable for the following components:   RBC 3.75 (*)    Hemoglobin 11.1 (*)    HCT 33.2 (*)    RDW 15.7 (*)    All other components within normal limits  URINALYSIS, ROUTINE W REFLEX MICROSCOPIC - Abnormal; Notable for the following components:   Color, Urine YELLOW (*)    APPearance CLEAR (*)    All other components within normal limits  TROPONIN I (HIGH SENSITIVITY) - Abnormal; Notable for the following components:   Troponin I (High Sensitivity) 20 (*)    All other components within normal limits  CULTURE, BLOOD (ROUTINE X 2)  CULTURE, BLOOD (ROUTINE X 2)  LACTIC ACID, PLASMA  LACTIC ACID, PLASMA  PROTIME-INR  PROTIME-INR  APTT  CBC  CBG MONITORING, ED   EKG ED ECG REPORT I,  Merwyn Katos, the attending physician, personally viewed and interpreted this ECG. Date: 04/18/2023 EKG Time: 2103 Rate: 71 Rhythm: normal sinus rhythm QRS Axis: normal Intervals: normal ST/T Wave abnormalities: normal Narrative Interpretation: no evidence of acute ischemia RADIOLOGY ED MD interpretation: MRI pending -Agree with radiology assessment Official radiology report(s): No results found. PROCEDURES: Critical Care performed: Yes, see critical care procedure note(s) .1-3 Lead EKG Interpretation  Performed by: Merwyn Katos, MD Authorized by: Merwyn Katos, MD     Interpretation: normal     ECG rate:  71   ECG rate assessment: normal     Rhythm: sinus rhythm     Ectopy: none     Conduction: normal   CRITICAL CARE Performed by: Merwyn Katos  Total critical care time: 33 minutes  Critical care time was exclusive  of separately billable procedures and treating other patients.  Critical care was necessary to treat or prevent imminent or life-threatening deterioration.  Critical care was time spent personally by me on the following activities: development of treatment plan with patient and/or surrogate as well as nursing, discussions with consultants, evaluation of patient's response to treatment, examination of patient, obtaining history from patient or surrogate, ordering and performing treatments and interventions, ordering and review of laboratory studies, ordering and review of radiographic studies, pulse oximetry and re-evaluation of patient's condition.  MEDICATIONS ORDERED IN ED: Medications  0.9 %  sodium chloride infusion ( Intravenous New Bag/Given 04/18/23 1817)  ondansetron (ZOFRAN) injection 4 mg (has no administration in time range)  LORazepam (ATIVAN) injection 0.5 mg (has no administration in time range)  acetaminophen (TYLENOL) tablet 650 mg (has no administration in time range)  aspirin EC tablet 81 mg (81 mg Oral Patient Refused/Not Given 04/18/23 2144)  gabapentin (NEURONTIN) capsule 800 mg (800 mg Oral Given 04/18/23 2143)  atorvastatin (LIPITOR) tablet 80 mg (80 mg Oral Given 04/18/23 2143)  insulin aspart (novoLOG) injection 0-15 Units (has no administration in time range)  insulin aspart (novoLOG) injection 0-5 Units ( Subcutaneous Not Given 04/18/23 2136)  cyanocobalamin (VITAMIN B12) tablet 1,000 mcg (has no administration in time range)  docusate sodium (COLACE) capsule 100 mg (has no administration in time range)  clopidogrel (PLAVIX) tablet 75 mg (75 mg Oral Given 04/18/23 2143)  pantoprazole (PROTONIX) EC tablet 40 mg (40 mg Oral Given 04/18/23 2143)  cholecalciferol (VITAMIN D3) 25 MCG (1000 UNIT) tablet 1,000 Units (1,000 Units Oral Given 04/18/23 2143)  enoxaparin (LOVENOX) injection 30 mg (has no administration in time range)  sodium chloride flush (NS) 0.9 % injection 3 mL (3 mLs  Intravenous Not Given 04/18/23 2147)  oxyCODONE (Oxy IR/ROXICODONE) immediate release tablet 20 mg (has no administration in time range)    And  acetaminophen (TYLENOL) tablet 650 mg (has no administration in time range)  piperacillin-tazobactam (ZOSYN) IVPB 3.375 g (3.375 g Intravenous New Bag/Given 04/18/23 2150)  sodium chloride 0.9 % bolus 1,000 mL (0 mLs Intravenous Stopped 04/18/23 1633)  sodium chloride 0.9 % bolus 1,000 mL (0 mLs Intravenous Stopped 04/18/23 1703)    And  sodium chloride 0.9 % bolus 1,000 mL (0 mLs Intravenous Stopped 04/18/23 1859)    And  sodium chloride 0.9 % bolus 1,000 mL (0 mLs Intravenous Stopped 04/18/23 1703)  cefTRIAXone (ROCEPHIN) 2 g in sodium chloride 0.9 % 100 mL IVPB (0 g Intravenous Stopped 04/18/23 1729)  vancomycin (VANCOREADY) IVPB 1750 mg/350 mL (0 mg Intravenous Stopped 04/18/23 2017)  morphine (PF) 4 MG/ML injection 4 mg (4 mg  Intravenous Given 04/18/23 1637)  gadobutrol (GADAVIST) 1 MMOL/ML injection 8 mL (8 mLs Intravenous Contrast Given 04/18/23 2244)   IMPRESSION / MDM / ASSESSMENT AND PLAN / ED COURSE  I reviewed the triage vital signs and the nursing notes.                             The patient is on the cardiac monitor to evaluate for evidence of arrhythmia and/or significant heart rate changes. Patient's presentation is most consistent with acute presentation with potential threat to life or bodily function. Patient is a 77 year old male who presents for possible bony infection of of the lower extremity Given history, exam, and workup I have low suspicion for necrotizing fasciitis, abscess, DVT, or other emergent problem as a cause for this presentation  Interventions: Vancomycin, cefepime Consults: Hospitalist, podiatry Dispo: Admit   FINAL CLINICAL IMPRESSION(S) / ED DIAGNOSES   Final diagnoses:  Subacute osteomyelitis of right foot (HCC)   Rx / DC Orders   ED Discharge Orders     None      Note:  This document was prepared using Dragon  voice recognition software and may include unintentional dictation errors.   Merwyn Katos, MD 04/18/23 [phone]

## 2023-04-19 ENCOUNTER — Inpatient Hospital Stay: Payer: Medicare HMO | Admitting: Certified Registered"

## 2023-04-19 ENCOUNTER — Inpatient Hospital Stay: Admission: RE | Admit: 2023-04-19 | Payer: Medicare HMO | Source: Home / Self Care | Admitting: Podiatry

## 2023-04-19 ENCOUNTER — Encounter
Admission: EM | Disposition: A | Discharge status: Home / Self Care | Payer: Self-pay | Source: Home / Self Care | Attending: Student

## 2023-04-19 ENCOUNTER — Other Ambulatory Visit: Payer: Self-pay

## 2023-04-19 ENCOUNTER — Encounter: Payer: Self-pay | Admitting: Internal Medicine

## 2023-04-19 DIAGNOSIS — E1169 Type 2 diabetes mellitus with other specified complication: Secondary | ICD-10-CM | POA: Diagnosis present

## 2023-04-19 DIAGNOSIS — M86171 Other acute osteomyelitis, right ankle and foot: Secondary | ICD-10-CM | POA: Diagnosis present

## 2023-04-19 DIAGNOSIS — E861 Hypovolemia: Secondary | ICD-10-CM | POA: Diagnosis not present

## 2023-04-19 DIAGNOSIS — I110 Hypertensive heart disease with heart failure: Secondary | ICD-10-CM | POA: Diagnosis present

## 2023-04-19 DIAGNOSIS — D509 Iron deficiency anemia, unspecified: Secondary | ICD-10-CM | POA: Diagnosis present

## 2023-04-19 DIAGNOSIS — L03119 Cellulitis of unspecified part of limb: Secondary | ICD-10-CM | POA: Diagnosis not present

## 2023-04-19 DIAGNOSIS — I5032 Chronic diastolic (congestive) heart failure: Secondary | ICD-10-CM | POA: Diagnosis present

## 2023-04-19 DIAGNOSIS — E1151 Type 2 diabetes mellitus with diabetic peripheral angiopathy without gangrene: Secondary | ICD-10-CM | POA: Diagnosis present

## 2023-04-19 DIAGNOSIS — I959 Hypotension, unspecified: Secondary | ICD-10-CM | POA: Diagnosis present

## 2023-04-19 DIAGNOSIS — E1142 Type 2 diabetes mellitus with diabetic polyneuropathy: Secondary | ICD-10-CM | POA: Diagnosis present

## 2023-04-19 DIAGNOSIS — Z87891 Personal history of nicotine dependence: Secondary | ICD-10-CM | POA: Diagnosis not present

## 2023-04-19 DIAGNOSIS — L97519 Non-pressure chronic ulcer of other part of right foot with unspecified severity: Secondary | ICD-10-CM | POA: Diagnosis present

## 2023-04-19 DIAGNOSIS — Z951 Presence of aortocoronary bypass graft: Secondary | ICD-10-CM | POA: Diagnosis not present

## 2023-04-19 DIAGNOSIS — Y838 Other surgical procedures as the cause of abnormal reaction of the patient, or of later complication, without mention of misadventure at the time of the procedure: Secondary | ICD-10-CM | POA: Diagnosis present

## 2023-04-19 DIAGNOSIS — Z7982 Long term (current) use of aspirin: Secondary | ICD-10-CM | POA: Diagnosis not present

## 2023-04-19 DIAGNOSIS — I2510 Atherosclerotic heart disease of native coronary artery without angina pectoris: Secondary | ICD-10-CM | POA: Diagnosis present

## 2023-04-19 DIAGNOSIS — E785 Hyperlipidemia, unspecified: Secondary | ICD-10-CM | POA: Diagnosis present

## 2023-04-19 DIAGNOSIS — D696 Thrombocytopenia, unspecified: Secondary | ICD-10-CM | POA: Diagnosis present

## 2023-04-19 DIAGNOSIS — Z8249 Family history of ischemic heart disease and other diseases of the circulatory system: Secondary | ICD-10-CM | POA: Diagnosis not present

## 2023-04-19 DIAGNOSIS — G894 Chronic pain syndrome: Secondary | ICD-10-CM | POA: Diagnosis present

## 2023-04-19 DIAGNOSIS — T8743 Infection of amputation stump, right lower extremity: Secondary | ICD-10-CM | POA: Diagnosis present

## 2023-04-19 DIAGNOSIS — T8149XA Infection following a procedure, other surgical site, initial encounter: Secondary | ICD-10-CM | POA: Diagnosis present

## 2023-04-19 DIAGNOSIS — I252 Old myocardial infarction: Secondary | ICD-10-CM | POA: Diagnosis not present

## 2023-04-19 DIAGNOSIS — N179 Acute kidney failure, unspecified: Secondary | ICD-10-CM | POA: Diagnosis present

## 2023-04-19 DIAGNOSIS — E860 Dehydration: Secondary | ICD-10-CM | POA: Diagnosis present

## 2023-04-19 DIAGNOSIS — E11628 Type 2 diabetes mellitus with other skin complications: Secondary | ICD-10-CM | POA: Diagnosis not present

## 2023-04-19 DIAGNOSIS — L03115 Cellulitis of right lower limb: Secondary | ICD-10-CM | POA: Diagnosis present

## 2023-04-19 DIAGNOSIS — E11621 Type 2 diabetes mellitus with foot ulcer: Secondary | ICD-10-CM | POA: Diagnosis present

## 2023-04-19 HISTORY — PX: INCISION AND DRAINAGE: SHX5863

## 2023-04-19 LAB — BASIC METABOLIC PANEL
Anion gap: 8 (ref 5–15)
BUN: 15 mg/dL (ref 8–23)
CO2: 19 mmol/L — ABNORMAL LOW (ref 22–32)
Calcium: 7.9 mg/dL — ABNORMAL LOW (ref 8.9–10.3)
Chloride: 111 mmol/L (ref 98–111)
Creatinine, Ser: 1.34 mg/dL — ABNORMAL HIGH (ref 0.61–1.24)
GFR, Estimated: 56 mL/min — ABNORMAL LOW (ref >60)
Glucose, Bld: 130 mg/dL — ABNORMAL HIGH (ref 70–99)
Potassium: 4.4 mmol/L (ref 3.5–5.1)
Sodium: 138 mmol/L (ref 135–145)

## 2023-04-19 LAB — CBC
HCT: 28.7 % — ABNORMAL LOW (ref 39.0–52.0)
HCT: 33 % — ABNORMAL LOW (ref 39.0–52.0)
Hemoglobin: 9.4 g/dL — ABNORMAL LOW (ref 13.0–17.0)
Hemoglobin: 11 g/dL — ABNORMAL LOW (ref 13.0–17.0)
MCH: 29.4 pg (ref 26.0–34.0)
MCH: 29.9 pg (ref 26.0–34.0)
MCHC: 32.8 g/dL (ref 30.0–36.0)
MCHC: 33.3 g/dL (ref 30.0–36.0)
MCV: 89.7 fL (ref 80.0–100.0)
MCV: 89.7 fL (ref 80.0–100.0)
Platelets: 111 10*3/uL — ABNORMAL LOW (ref 150–400)
Platelets: 146 10*3/uL — ABNORMAL LOW (ref 150–400)
RBC: 3.2 MIL/uL — ABNORMAL LOW (ref 4.22–5.81)
RBC: 3.68 MIL/uL — ABNORMAL LOW (ref 4.22–5.81)
RDW: 15.8 % — ABNORMAL HIGH (ref 11.5–15.5)
RDW: 15.9 % — ABNORMAL HIGH (ref 11.5–15.5)
WBC: 4.9 10*3/uL (ref 4.0–10.5)
WBC: 5.3 10*3/uL (ref 4.0–10.5)
nRBC: 0 % (ref 0.0–0.2)
nRBC: 0 % (ref 0.0–0.2)

## 2023-04-19 LAB — VITAMIN B12: Vitamin B-12: 894 pg/mL (ref 180–914)

## 2023-04-19 LAB — SURGICAL PCR SCREEN
MRSA, PCR: NEGATIVE
Staphylococcus aureus: POSITIVE — AB

## 2023-04-19 LAB — MAGNESIUM: Magnesium: 1.7 mg/dL (ref 1.7–2.4)

## 2023-04-19 LAB — GLUCOSE, CAPILLARY
Glucose-Capillary: 94 mg/dL (ref 70–99)
Glucose-Capillary: 181 mg/dL — ABNORMAL HIGH (ref 70–99)
Glucose-Capillary: 71 mg/dL (ref 70–99)
Glucose-Capillary: 86 mg/dL (ref 70–99)
Glucose-Capillary: 180 mg/dL — ABNORMAL HIGH (ref 70–99)

## 2023-04-19 LAB — TROPONIN I (HIGH SENSITIVITY)
Troponin I (High Sensitivity): 16 ng/L (ref <18)
Troponin I (High Sensitivity): 16 ng/L (ref <18)

## 2023-04-19 LAB — CULTURE, BLOOD (ROUTINE X 2)
Culture: NO GROWTH
Culture: NO GROWTH

## 2023-04-19 LAB — PROTIME-INR
INR: 1.3 — ABNORMAL HIGH (ref 0.8–1.2)
Prothrombin Time: 16.1 seconds — ABNORMAL HIGH (ref 11.4–15.2)

## 2023-04-19 LAB — FOLATE: Folate: 16.5 ng/mL (ref >5.9)

## 2023-04-19 LAB — VITAMIN D 25 HYDROXY (VIT D DEFICIENCY, FRACTURES): Vit D, 25-Hydroxy: 37.4 ng/mL (ref 30–100)

## 2023-04-19 LAB — APTT: aPTT: 39 seconds — ABNORMAL HIGH (ref 24–36)

## 2023-04-19 LAB — IRON AND TIBC
Iron: 18 ug/dL — ABNORMAL LOW (ref 45–182)
Saturation Ratios: 7 % — ABNORMAL LOW (ref 17.9–39.5)
TIBC: 259 ug/dL (ref 250–450)
UIBC: 241 ug/dL

## 2023-04-19 LAB — SEDIMENTATION RATE: Sed Rate: 52 mm/hr — ABNORMAL HIGH (ref 0–20)

## 2023-04-19 LAB — PHOSPHORUS: Phosphorus: 2.7 mg/dL (ref 2.5–4.6)

## 2023-04-19 LAB — VAS US ABI WITH/WO TBI: Left ABI: 0.95

## 2023-04-19 LAB — C-REACTIVE PROTEIN: CRP: 9.3 mg/dL — ABNORMAL HIGH (ref <1.0)

## 2023-04-19 LAB — CK: Total CK: 70 U/L (ref 49–397)

## 2023-04-19 SURGERY — INCISION AND DRAINAGE
Anesthesia: General | Site: Foot | Laterality: Right

## 2023-04-19 MED ORDER — POLYSACCHARIDE IRON COMPLEX 150 MG PO CAPS
150.0000 mg | ORAL_CAPSULE | Freq: Every day | ORAL | Status: DC
  Administered 2023-04-20: 150 mg via ORAL
  Filled 2023-04-19: qty 1

## 2023-04-19 MED ORDER — PROPOFOL 500 MG/50ML IV EMUL
INTRAVENOUS | Status: DC | PRN
  Administered 2023-04-19: 120 ug/kg/min via INTRAVENOUS

## 2023-04-19 MED ORDER — ACETAMINOPHEN 10 MG/ML IV SOLN: 1000.0000 mg | Freq: Once | INTRAVENOUS | Status: DC | PRN

## 2023-04-19 MED ORDER — SODIUM CHLORIDE 0.9 % IR SOLN
Status: DC | PRN
  Administered 2023-04-19: 3000 mL

## 2023-04-19 MED ORDER — HYDRALAZINE HCL 20 MG/ML IJ SOLN: 10.0000 mg | Freq: Four times a day (QID) | INTRAMUSCULAR | Status: DC | PRN

## 2023-04-19 MED ORDER — SODIUM CHLORIDE 0.9 % IV SOLN: INTRAVENOUS | Status: DC

## 2023-04-19 MED ORDER — ONDANSETRON HCL 4 MG/2ML IJ SOLN: 4.0000 mg | Freq: Once | INTRAMUSCULAR | Status: DC | PRN

## 2023-04-19 MED ORDER — OXYCODONE HCL 5 MG PO TABS
10.0000 mg | ORAL_TABLET | ORAL | Status: DC | PRN
  Administered 2023-04-19 – 2023-04-20 (×5): 10 mg via ORAL
  Filled 2023-04-19 (×5): qty 2

## 2023-04-19 MED ORDER — FENTANYL CITRATE (PF) 100 MCG/2ML IJ SOLN
INTRAMUSCULAR | Status: AC
  Filled 2023-04-19: qty 2

## 2023-04-19 MED ORDER — OXYCODONE HCL 5 MG/5ML PO SOLN: 5.0000 mg | Freq: Once | ORAL | Status: DC | PRN

## 2023-04-19 MED ORDER — PROPOFOL 10 MG/ML IV BOLUS
INTRAVENOUS | Status: DC | PRN
  Administered 2023-04-19: 40 mg via INTRAVENOUS

## 2023-04-19 MED ORDER — FENTANYL CITRATE (PF) 100 MCG/2ML IJ SOLN: 25.0000 ug | INTRAMUSCULAR | Status: DC | PRN

## 2023-04-19 MED ORDER — HYDRALAZINE HCL 50 MG PO TABS: 50.0000 mg | ORAL_TABLET | Freq: Four times a day (QID) | ORAL | Status: DC | PRN

## 2023-04-19 MED ORDER — BUPIVACAINE HCL (PF) 0.5 % IJ SOLN
INTRAMUSCULAR | Status: DC | PRN
  Administered 2023-04-19: 10 mL

## 2023-04-19 MED ORDER — VANCOMYCIN HCL 1500 MG/300ML IV SOLN: 1500.0000 mg | INTRAVENOUS | Status: DC

## 2023-04-19 MED ORDER — MUPIROCIN 2 % EX OINT
1.0000 | TOPICAL_OINTMENT | Freq: Two times a day (BID) | CUTANEOUS | Status: DC
  Administered 2023-04-19 – 2023-04-20 (×3): 1 via NASAL
  Filled 2023-04-19: qty 22

## 2023-04-19 MED ORDER — FENTANYL CITRATE (PF) 100 MCG/2ML IJ SOLN
INTRAMUSCULAR | Status: DC | PRN
  Administered 2023-04-19 (×2): 50 ug via INTRAVENOUS

## 2023-04-19 MED ORDER — SODIUM CHLORIDE 0.9 % IV SOLN
8.0000 mg/kg | Freq: Every day | INTRAVENOUS | Status: DC
  Administered 2023-04-20: 700 mg via INTRAVENOUS
  Filled 2023-04-19 (×3): qty 14

## 2023-04-19 MED ORDER — 0.9 % SODIUM CHLORIDE (POUR BTL) OPTIME
TOPICAL | Status: DC | PRN
  Administered 2023-04-19: 500 mL

## 2023-04-19 MED ORDER — OXYCODONE HCL 5 MG PO TABS: 5.0000 mg | ORAL_TABLET | Freq: Once | ORAL | Status: DC | PRN

## 2023-04-19 MED ORDER — PROPOFOL 1000 MG/100ML IV EMUL
INTRAVENOUS | Status: AC
  Filled 2023-04-19: qty 100

## 2023-04-19 MED ORDER — SODIUM CHLORIDE 0.9 % IV SOLN
3.0000 g | Freq: Four times a day (QID) | INTRAVENOUS | Status: DC
  Administered 2023-04-19 – 2023-04-20 (×3): 3 g via INTRAVENOUS
  Filled 2023-04-19 (×2): qty 8
  Filled 2023-04-19: qty 3
  Filled 2023-04-19: qty 8
  Filled 2023-04-19: qty 3
  Filled 2023-04-19: qty 8

## 2023-04-19 MED ORDER — ENOXAPARIN SODIUM 40 MG/0.4ML IJ SOSY
40.0000 mg | PREFILLED_SYRINGE | INTRAMUSCULAR | Status: DC
  Filled 2023-04-19: qty 0.4

## 2023-04-19 MED ORDER — VITAMIN C 500 MG PO TABS
500.0000 mg | ORAL_TABLET | Freq: Every day | ORAL | Status: DC
  Administered 2023-04-20: 500 mg via ORAL
  Filled 2023-04-19: qty 1

## 2023-04-19 MED ORDER — DEXMEDETOMIDINE HCL IN NACL 80 MCG/20ML IV SOLN
INTRAVENOUS | Status: DC | PRN
  Administered 2023-04-19: 8 ug via INTRAVENOUS

## 2023-04-19 MED ORDER — ACETAMINOPHEN 325 MG PO TABS: 650.0000 mg | ORAL_TABLET | Freq: Four times a day (QID) | ORAL | Status: DC | PRN

## 2023-04-19 MED ORDER — OXYCODONE HCL 5 MG PO TABS
20.0000 mg | ORAL_TABLET | ORAL | Status: DC | PRN
  Administered 2023-04-19: 20 mg via ORAL
  Filled 2023-04-19: qty 4

## 2023-04-19 MED ORDER — VANCOMYCIN HCL 1000 MG IV SOLR
INTRAVENOUS | Status: DC | PRN
  Administered 2023-04-19: 1000 mg

## 2023-04-19 MED ORDER — BUPIVACAINE HCL (PF) 0.5 % IJ SOLN
INTRAMUSCULAR | Status: AC
  Filled 2023-04-19: qty 30

## 2023-04-19 MED ORDER — PHENYLEPHRINE HCL (PRESSORS) 10 MG/ML IV SOLN
INTRAVENOUS | Status: DC | PRN
  Administered 2023-04-19 (×3): 80 ug via INTRAVENOUS

## 2023-04-19 MED ORDER — VANCOMYCIN HCL 1000 MG IV SOLR
INTRAVENOUS | Status: AC
  Filled 2023-04-19: qty 20

## 2023-04-19 SURGICAL SUPPLY — 51 items
BLADE OSCILLATING/SAGITTAL (BLADE)
BLADE SURG 15 STRL LF DISP TIS (BLADE) ×1 IMPLANT
BLADE SURG 15 STRL SS (BLADE) ×1
BLADE SURG MINI STRL (BLADE) IMPLANT
BLADE SW THK.38XMED LNG THN (BLADE) IMPLANT
BNDG ELASTIC 4X5.8 VLCR NS LF (GAUZE/BANDAGES/DRESSINGS) ×1 IMPLANT
BNDG ESMARCH 4 X 12 STRL LF (GAUZE/BANDAGES/DRESSINGS) ×1
BNDG ESMARCH 4X12 STRL LF (GAUZE/BANDAGES/DRESSINGS) ×1 IMPLANT
BNDG GAUZE DERMACEA FLUFF 4 (GAUZE/BANDAGES/DRESSINGS) ×1 IMPLANT
CNTNR URN SCR LID CUP LEK RST (MISCELLANEOUS) IMPLANT
CONT SPEC 4OZ STRL OR WHT (MISCELLANEOUS) ×1
CUFF TOURN SGL QUICK 12 (TOURNIQUET CUFF) IMPLANT
CUFF TOURN SGL QUICK 18X4 (TOURNIQUET CUFF) IMPLANT
DRAPE FLUOR MINI C-ARM 54X84 (DRAPES) IMPLANT
DRSG XEROFORM 1X8 (GAUZE/BANDAGES/DRESSINGS) IMPLANT
DURAPREP 26ML APPLICATOR (WOUND CARE) ×1 IMPLANT
ELECT REM PT RETURN 9FT ADLT (ELECTROSURGICAL) ×1
ELECTRODE REM PT RTRN 9FT ADLT (ELECTROSURGICAL) ×1 IMPLANT
GAUZE SPONGE 4X4 12PLY STRL (GAUZE/BANDAGES/DRESSINGS) ×1 IMPLANT
GAUZE STRETCH 2X75IN STRL (MISCELLANEOUS) ×1 IMPLANT
GAUZE XEROFORM 1X8 LF (GAUZE/BANDAGES/DRESSINGS) ×1 IMPLANT
GLOVE BIO SURGEON STRL SZ7.5 (GLOVE) ×1 IMPLANT
GLOVE INDICATOR 8.0 STRL GRN (GLOVE) ×1 IMPLANT
GOWN STRL REUS W/ TWL LRG LVL3 (GOWN DISPOSABLE) ×2 IMPLANT
GOWN STRL REUS W/TWL LRG LVL3 (GOWN DISPOSABLE) ×2
HANDPIECE VERSAJET DEBRIDEMENT (MISCELLANEOUS) ×1 IMPLANT
KIT STIMULAN RAPID CURE 5CC (Orthopedic Implant) IMPLANT
KIT TURNOVER KIT A (KITS) ×1 IMPLANT
LABEL OR SOLS (LABEL) ×1 IMPLANT
MANIFOLD NEPTUNE II (INSTRUMENTS) ×1 IMPLANT
NDL FILTER BLUNT 18X1 1/2 (NEEDLE) ×1 IMPLANT
NEEDLE FILTER BLUNT 18X1 1/2 (NEEDLE) ×1 IMPLANT
NS IRRIG 500ML POUR BTL (IV SOLUTION) ×1 IMPLANT
PACK EXTREMITY ARMC (MISCELLANEOUS) ×1 IMPLANT
PAD ABD DERMACEA PRESS 5X9 (GAUZE/BANDAGES/DRESSINGS) ×2 IMPLANT
RASP SM TEAR CROSS CUT (RASP) IMPLANT
SOL PREP PVP 2OZ (MISCELLANEOUS) ×1
SOLUTION PREP PVP 2OZ (MISCELLANEOUS) ×1 IMPLANT
STOCKINETTE STRL 6IN 960660 (GAUZE/BANDAGES/DRESSINGS) ×1 IMPLANT
SUT ETHILON 3-0 FS-10 30 BLK (SUTURE) ×1
SUT ETHILON 4-0 (SUTURE) ×1
SUT ETHILON 4-0 FS2 18XMFL BLK (SUTURE) ×1
SUT VIC AB 3-0 SH 27 (SUTURE) ×1
SUT VIC AB 3-0 SH 27X BRD (SUTURE) ×1 IMPLANT
SUT VIC AB 4-0 FS2 27 (SUTURE) ×1 IMPLANT
SUTURE EHLN 3-0 FS-10 30 BLK (SUTURE) ×1 IMPLANT
SUTURE ETHLN 4-0 FS2 18XMF BLK (SUTURE) ×1 IMPLANT
SYR 10ML LL (SYRINGE) ×2 IMPLANT
SYR 3ML LL SCALE MARK (SYRINGE) ×1 IMPLANT
TRAP FLUID SMOKE EVACUATOR (MISCELLANEOUS) ×1 IMPLANT
WATER STERILE IRR 500ML POUR (IV SOLUTION) ×1 IMPLANT

## 2023-04-19 NOTE — Anesthesia Postprocedure Evaluation (Signed)
Anesthesia Post Note  Patient: Kenneth Duarte  Procedure(s) Performed: INCISION AND DRAINAGE (Right: Foot)  Patient location during evaluation: PACU Anesthesia Type: General Level of consciousness: awake and alert Pain management: pain level controlled Vital Signs Assessment: post-procedure vital signs reviewed and stable Respiratory status: spontaneous breathing, nonlabored ventilation, respiratory function stable and patient connected to nasal cannula oxygen Cardiovascular status: blood pressure returned to baseline and stable Postop Assessment: no apparent nausea or vomiting Anesthetic complications: no   No notable events documented.   Last Vitals:  Vitals:   04/19/23 1805 04/19/23 1815  BP: (!) 145/61 (!) 160/61  Pulse: 74 74  Resp: 15 17  Temp:  (!) 36.4 C  SpO2: 93% 94%    Last Pain:  Vitals:   04/19/23 1815  TempSrc:   PainSc: 0-No pain                 Corinda Gubler

## 2023-04-19 NOTE — Anesthesia Preprocedure Evaluation (Signed)
Anesthesia Evaluation  Patient identified by MRN, date of birth, ID band Patient awake    Reviewed: Allergy & Precautions, NPO status , Patient's Chart, lab work & pertinent test results  History of Anesthesia Complications Negative for: history of anesthetic complications  Airway Mallampati: III   Neck ROM: Full    Dental  (+) Missing   Pulmonary neg sleep apnea, COPD, Patient abstained from smoking.Not current smoker, former smoker   Pulmonary exam normal breath sounds clear to auscultation       Cardiovascular Exercise Tolerance: Good METShypertension, + CAD (s/p MI, 3V CABG 2018, stents 2018 on Plavix), + Peripheral Vascular Disease and +CHF (diastolic)  (-) Past MI Normal cardiovascular exam(-) dysrhythmias  Rhythm:Regular Rate:Normal  ECG 02/10/22: NSR rate 66  BPM, no significant ST or T wave changes   Neuro/Psych  Headaches Chronic pain; vertigo  negative psych ROS   GI/Hepatic ,GERD  ,,(+)     (-) substance abuse    Endo/Other  diabetes, Type 2    Renal/GU negative Renal ROS     Musculoskeletal  (+) Arthritis ,  Gout    Abdominal   Peds  Hematology  (+) Blood dyscrasia, anemia   Anesthesia Other Findings Cardiology note 05/10/22:  Coronary artery disease of bypass graft of native heart with stable angina pectoris (HCC) - CABG 2018 Currently with no symptoms of angina. No further workup at this time. Continue current medication regimen.   Benign essential HTN Blood pressure well controlled today, no changes made Recommend he try to stay hydrated For orthostasis symptoms recommend he call our office   Type 2 diabetes mellitus with other circulatory complication, without long-term current use of insulin (HCC) No recent lab work available,    Dyslipidemia Checked with PMD,  Goal LDL <70 No recent lab work available for review   S/P CABG x 3 -  Prior surgery 2018 Non-smoker, diabetes well  controlled, cholesterol in the past well controlled Stable   Reproductive/Obstetrics                              Anesthesia Physical Anesthesia Plan  ASA: 3  Anesthesia Plan: General   Post-op Pain Management: Minimal or no pain anticipated   Induction: Intravenous  PONV Risk Score and Plan: 2 and Ondansetron, Treatment may vary due to age or medical condition, Propofol infusion and TIVA  Airway Management Planned: Natural Airway  Additional Equipment: None  Intra-op Plan:   Post-operative Plan:   Informed Consent: I have reviewed the patients History and Physical, chart, labs and discussed the procedure including the risks, benefits and alternatives for the proposed anesthesia with the patient or authorized representative who has indicated his/her understanding and acceptance.     Dental advisory given  Plan Discussed with: CRNA  Anesthesia Plan Comments: (LMA/GETA backup discussed.  Patient consented for risks of anesthesia including but not limited to:  - adverse reactions to medications - damage to eyes, teeth, lips or other oral mucosa - nerve damage due to positioning  - sore throat or hoarseness - damage to heart, brain, nerves, lungs, other parts of body or loss of life  Informed patient about role of CRNA in peri- and intra-operative care.  Patient voiced understanding.)         Anesthesia Quick Evaluation

## 2023-04-19 NOTE — Consult Note (Signed)
[facility]                                                                                        Patient Identification: Patient Name: Kenneth Duarte MRN: [id number] Admit Date: 04/18/2023  3:55 PM Today's Date: 04/19/2023 Reason for consult: Osteomyelitis Requesting provider: Lillia Abed  Principal Problem:   Hypotension Active Problems:   Cellulitis in diabetic foot (HCC)   Antibiotics:  Vancomycin 5/1 Pip-tazo 5/1 Ceftriaxone 5/1  Lines/Hardware:  Assessment 77 year old male with PMH as below including HLD, HTN, gout, GERD, CAD status post CABG, CHF, arthritis, chronic pain syndrome, 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 Please send cultures and path as appropriate Monitor CBC and BMP Following   Rest of the management as per the primary team. Please call with questions or concerns.  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 Reports doing well after the amputation and was given PO abtx which he completed as instructed ( wound cx MSSA, E faecalis; doxycyline and augmentin  for 7 days each). Was closely following with vascular and podiatry. Seen by Podiatry 5/1 where he reported cold chills and generalized body ache and was advised to come to ED. Denies any nausea, vomiting or abdominal pain or diarrhea.  Denies any chest pain, cough or shortness of breath  At ED, afebrile, soft BP Labs remarkable for AKI with creatinine 2.24.  No leukocytosis.  Troponin 20 Received IVF and antibiotics MRI rt foot reviewed  ID consulted for osteomyelitis   ROS: General- Denies  loss of appetite and loss of weight HEENT - Denies headache, blurry vision, neck pain, sinus pain Chest - Denies any chest pain, SOB or cough CVS- Denies any dizziness/lightheadedness, syncopal attacks, palpitations Abdomen- Denies any nausea, vomiting, abdominal pain, hematochezia and diarrhea Neuro - Denies any weakness, tingling sensation Psych - Denies any changes in mood irritability or depressive symptoms GU- Denies any burning, dysuria, hematuria or increased frequency of urination Skin - denies any rashes/lesions MSK - denies any joint pain/swelling or restricted ROM   Past Medical History:  Diagnosis Date   Arthritis    Chronic pain syndrome    Congestive heart  failure [facility]) 1980   Coronary artery disease    a. s/p 3V CABG (03/2017). b. NSTEMI 06/2017 with progressive graft disease, s/p DES to SVG-OM.   Diabetes (HCC)    DIET   Dyspnea    with exertion   ED (erectile dysfunction)    GERD (gastroesophageal reflux disease)    Gout    Headache    Hyperlipemia    Hypertension    CONTROLLED ON MEDS   IDA (iron deficiency anemia) 10/21/2019   Neuropathy of both feet    Seborrheic keratosis    Sinus congestion    Vertigo    Wears dentures    PARTIAL UPPER    Past Surgical History:  Procedure Laterality  Date   COLONOSCOPY WITH PROPOFOL N/A 10/25/2015   Procedure: COLONOSCOPY WITH PROPOFOL;  Surgeon: Midge Minium, MD;  Location: [facility];  Service: Endoscopy;  Laterality: N/A;  DIABETIC-ORAL MEDS   COLONOSCOPY WITH PROPOFOL N/A 10/22/2020   Procedure: COLONOSCOPY CANCELED;  Surgeon: Midge Minium, MD;  Location: [facility];  Service: Endoscopy;  Laterality: N/A;  procedure aborted poor prep   COLONOSCOPY WITH PROPOFOL N/A 10/25/2020   Procedure: COLONOSCOPY WITH PROPOFOL;  Surgeon: Midge Minium, MD;  Location: [facility];  Service: Endoscopy;  Laterality: N/A;   CORONARY ARTERY BYPASS GRAFT N/A 04/10/2017   Procedure: CORONARY ARTERY BYPASS GRAFTING (CABG) x three , using left internal mammary artery and right leg greater saphenous vein harvested endoscopically;  Surgeon: Kerin Perna, MD;  Location: [facility] OR;  Service: Open Heart Surgery;  Laterality: N/A;   CORONARY ARTERY BYPASS GRAFT     CORONARY STENT INTERVENTION N/A 06/19/2017   Procedure: Coronary Stent Intervention;  Surgeon: Corky Crafts, MD;  Location: [facility] INVASIVE CV LAB;  Service: Cardiovascular;  Laterality: N/A;   ESOPHAGOGASTRODUODENOSCOPY (EGD) WITH PROPOFOL N/A 10/22/2020   Procedure: ESOPHAGOGASTRODUODENOSCOPY (EGD) WITH PROPOFOL;  Surgeon: Midge Minium, MD;  Location: [facility];  Service: Endoscopy;  Laterality: N/A;  Diabetic - oral meds   EXCISION PARTIAL PHALANX Right 03/22/2023   Procedure: EXCISION  PHALANX;  Surgeon: Rosetta Posner, DPM;  Location: [facility] ORS;  Service: Podiatry;  Laterality: Right;   IRRIGATION AND DEBRIDEMENT FOOT Right 03/22/2023   Procedure: IRRIGATION AND DEBRIDEMENT FOOT;  Surgeon: Rosetta Posner, DPM;  Location: [facility] ORS;  Service: Podiatry;  Laterality: Right;   KNEE SURGERY Right    over 20 years ago   LEFT HEART CATH AND CORONARY ANGIOGRAPHY Left 02/22/2017   Procedure: Left Heart Cath and Coronary Angiography;  Surgeon: Lamar Blinks, MD;  Location: [facility] INVASIVE  CV LAB;  Service: Cardiovascular;  Laterality: Left;   LEFT HEART CATH AND CORS/GRAFTS ANGIOGRAPHY N/A 06/18/2017   Procedure: Left Heart Cath and Cors/Grafts Angiography and PCI;  Surgeon: Marcina Millard, MD;  Location: [facility] INVASIVE CV LAB;  Service: Cardiovascular;  Laterality: N/A;   LOWER EXTREMITY ANGIOGRAPHY Right 03/23/2023   Procedure: Lower Extremity Angiography;  Surgeon: Annice Needy, MD;  Location: [facility] INVASIVE CV LAB;  Service: Cardiovascular;  Laterality: Right;   LOWER EXTREMITY INTERVENTION  03/23/2023   Procedure: LOWER EXTREMITY INTERVENTION;  Surgeon: Annice Needy, MD;  Location: [facility] INVASIVE CV LAB;  Service: Cardiovascular;;   POLYPECTOMY  10/25/2015   Procedure: POLYPECTOMY;  Surgeon: Midge Minium, MD;  Location: [facility]ERY [facility];  Service: Endoscopy;;   ROTATOR CUFF REPAIR Right 2012   TEE WITHOUT CARDIOVERSION N/A 04/10/2017   Procedure: TRANSESOPHAGEAL ECHOCARDIOGRAM (TEE);  Surgeon: Kerin Perna, MD;  Location: [facility]  OR;  Service: Open Heart Surgery;  Laterality: N/A;    Scheduled Meds:  aspirin EC  81 mg Oral Daily   atorvastatin  80 mg Oral QHS   cholecalciferol  1,000 Units Oral Daily   clopidogrel  75 mg Oral Daily   cyanocobalamin  1,000 mcg Oral Daily   enoxaparin (LOVENOX) injection  30 mg Subcutaneous Q24H   gabapentin  800 mg Oral TID   insulin aspart  0-15 Units Subcutaneous TID WC   insulin aspart  0-5 Units Subcutaneous QHS   mupirocin ointment  1 Application Nasal BID   pantoprazole  40 mg Oral Daily   sodium chloride flush  3 mL Intravenous Q12H   Continuous Infusions:  sodium chloride 150 mL/hr at 04/18/23 1817   piperacillin-tazobactam (ZOSYN)  IV 3.375 g (04/19/23 0516)   PRN Meds:.acetaminophen, oxyCODONE **AND** acetaminophen, docusate sodium, hydrALAZINE **OR** hydrALAZINE, LORazepam, ondansetron (ZOFRAN) IV  Allergies  Allergen Reactions   Blood-Group Specific Substance Other (See Comments)    Patient received almost 2 units FFP  during a procedure. Became hypotensive and developed rash   Cortisone Acetate [Cortisone] Rash and Other (See Comments)   Social History   Socioeconomic History   Marital status: Married    Spouse name: Not on file   Number of children: Not on file   Years of education: Not on file   Highest education level: Not on file  Occupational History   Not on file  Tobacco Use   Smoking status: Former    Packs/day: 2.00    Years: 15.00    Additional pack years: 0.00    Total pack years: 30.00    Types: Cigarettes    Quit date: 06/30/1979    Years since quitting: 43.8   Smokeless tobacco: Never  Vaping Use   Vaping Use: Never used  Substance and Sexual Activity   Alcohol use: No    Alcohol/week: 0.0 standard drinks of alcohol    Comment: QUIT IN 1980   Drug use: No   Sexual activity: Yes    Partners: Male    Birth control/protection: Other-see comments  Other Topics Concern   Not on file  Social History Narrative   Not on file   Social Determinants of Health   Financial Resource Strain: Not on file  Food Insecurity: No Food Insecurity (04/18/2023)   Hunger Vital Sign    Worried About Running Out of Food in the Last Year: Never true    Ran Out of Food in the Last Year: Never true  Transportation Needs: No Transportation Needs (04/18/2023)   PRAPARE - Administrator, Civil Service (Medical): No    Lack of Transportation (Non-Medical): No  Physical Activity: Not on file  Stress: Not on file  Social Connections: Not on file  Intimate Partner Violence: Not At Risk (04/18/2023)   Humiliation, Afraid, Rape, and Kick questionnaire    Fear of Current or Ex-Partner: No    Emotionally Abused: No    Physically Abused: No    Sexually Abused: No   Family History  Problem Relation Age of Onset   Cancer Father    Heart disease Father    Heart attack Father    Cancer Mother    Heart disease Brother    Diabetes Brother    Prostate cancer Brother    Diabetes Sister     Bladder Cancer Neg Hx    Kidney disease Neg Hx    Vitals BP (!) 131/53 (BP Location: Right  Arm)   Pulse 69   Temp 98.5 F (36.9 C)   Resp 18   Ht 5\' 11"  (1.803 m)   Wt 86.2 kg   SpO2 96%   BMI 26.50 kg/m   Physical Exam Constitutional:  adult male lying in the bed and not in acute distress     Comments: HEENT wnl   Cardiovascular:     Rate and Rhythm: Normal rate and regular rhythm.     Heart sounds: s1s2   Pulmonary:     Effort: Pulmonary effort is normal on room air     Comments: Normal breath sounds   Abdominal:     Palpations: Abdomen is soft.     Tenderness: non distended and non tender   Musculoskeletal:        General: Rt 5th toe amputated site with deep wound tracking down to bone and another proximal wound at the site of 5th metatatarsal. No active drainage, foul smell. Surrounding mild erythema and swelling.   Skin:    Comments:   Neurological:     General: No focal deficit present.   Psychiatric:        Mood and Affect: Mood normal.    Pertinent Microbiology Results for orders placed or performed during the hospital encounter of 04/18/23  Culture, blood (Routine x 2)     Status: None (Preliminary result)   Collection Time: 04/18/23  3:21 PM   Specimen: BLOOD  Result Value Ref Range Status   Specimen Description BLOOD LEFT ANTECUBITAL  Final   Special Requests   Final    BOTTLES DRAWN AEROBIC AND ANAEROBIC Blood Culture results may not be optimal due to an excessive volume of blood received in culture bottles   Culture   Final    NO GROWTH < 24 HOURS Performed at [facility], [address], [place], [place] [address]    Report Status PENDING  Incomplete  Culture, blood (Routine x 2)     Status: None (Preliminary result)   Collection Time: 04/18/23 11:18 PM   Specimen: BLOOD RIGHT HAND  Result Value Ref Range Status   Specimen Description BLOOD RIGHT HAND  Final   Special Requests   Final    BOTTLES DRAWN AEROBIC AND ANAEROBIC Blood  Culture adequate volume   Culture   Final    NO GROWTH < 12 HOURS Performed at [facility], [address] [place], [place] [address]    Report Status PENDING  Incomplete  Surgical PCR screen     Status: Abnormal   Collection Time: 04/19/23 12:15 AM   Specimen: Nasal Mucosa; Nasal Swab  Result Value Ref Range Status   MRSA, PCR NEGATIVE NEGATIVE Final   Staphylococcus aureus POSITIVE (A) NEGATIVE Final    Comment: (NOTE) The Xpert SA Assay (FDA approved for NASAL specimens in patients 63 years of age and older), is one component of a comprehensive surveillance program. It is not intended to diagnose infection nor to guide or monitor treatment. Performed at [facility], [address] [place], [place] [address]      Pertinent Lab seen by me:    Latest Ref Rng & Units 04/19/2023    8:44 AM 04/19/2023    4:26 AM 04/18/2023    3:21 PM  CBC  WBC 4.0 - 10.5 K/uL 5.3  4.9  9.2   Hemoglobin 13.0 - 17.0 g/dL 91.4  9.4  78.2   Hematocrit 39.0 - 52.0 % 33.0  28.7  33.2  Platelets 150 - 400 K/uL 146  111  157       Latest Ref Rng & Units 04/19/2023    8:44 AM 04/18/2023    3:21 PM 03/23/2023    5:30 AM  CMP  Glucose 70 - 99 mg/dL 409  811  914   BUN 8 - 23 mg/dL 15  26  11    Creatinine 0.61 - 1.24 mg/dL 7.82  9.56  2.13   Sodium 135 - 145 mmol/L 138  135  140   Potassium 3.5 - 5.1 mmol/L 4.4  4.4  4.2   Chloride 98 - 111 mmol/L 111  102  110   CO2 22 - 32 mmol/L 19  22  23    Calcium 8.9 - 10.3 mg/dL 7.9  8.2  8.3   Total Protein 6.5 - 8.1 g/dL  7.3    Total Bilirubin 0.3 - 1.2 mg/dL  1.2    Alkaline Phos 38 - 126 U/L  59    AST 15 - 41 U/L  22    ALT 0 - 44 U/L  18       Pertinent Imagings/Other Imagings Plain films and CT images have been personally visualized and interpreted; radiology reports have been reviewed. Decision making incorporated into the Impression / Recommendations.  VAS [place] ABI WITH/WO TBI  Result Date: 04/19/2023  LOWER EXTREMITY DOPPLER  STUDY Patient Name:  Kenneth Duarte  Date of Exam:   04/17/2023 Medical Rec #: [id number]          Accession #:    [id number] Date of Birth: [birth year]          Patient Gender: M Patient Age:   7 years Exam Location:  [facility] Procedure:      VAS [place] ABI WITH/WO TBI Referring Phys: Barbara Cower DEW --------------------------------------------------------------------------------  High Risk Factors: Hypertension, Diabetes, past history of smoking, prior MI.  Vascular Interventions: 03/23/2023 Right ATA angioplasty and right SFA stent. Performing Technologist: Hardie Lora RVT  Examination Guidelines: A complete evaluation includes at minimum, Doppler waveform signals and systolic blood pressure reading at the level of bilateral brachial, anterior tibial, and posterior tibial arteries, when vessel segments are accessible. Bilateral testing is considered an integral part of a complete examination. Photoelectric Plethysmograph (PPG) waveforms and toe systolic pressure readings are included as required and additional duplex testing as needed. Limited examinations for reoccurring indications may be performed as noted.  ABI Findings: +---------+------------------+-----+---------+--------+ Right    Rt Pressure (mmHg)IndexWaveform Comment  +---------+------------------+-----+---------+--------+ Brachial 172                                      +---------+------------------+-----+---------+--------+ ATA      255               1.48 biphasic          +---------+------------------+-----+---------+--------+ PTA      247               1.44 triphasic         +---------+------------------+-----+---------+--------+ Great Toe99                0.58                   +---------+------------------+-----+---------+--------+ +---------+------------------+-----+---------+-------+ Left     Lt Pressure (mmHg)IndexWaveform Comment +---------+------------------+-----+---------+-------+ Brachial  168                                     +---------+------------------+-----+---------+-------+  PTA      164               0.95 triphasic        +---------+------------------+-----+---------+-------+ DP       161               0.94 biphasic         +---------+------------------+-----+---------+-------+ Great Toe100               0.58                  +---------+------------------+-----+---------+-------+ +-------+----------------+-----------+------------+------------+ ABI/TBIToday's ABI     Today's TBIPrevious ABIPrevious TBI +-------+----------------+-----------+------------+------------+ Right  Non compressible0.58                                +-------+----------------+-----------+------------+------------+ Left   0.95            0.58                                +-------+----------------+-----------+------------+------------+ Arterial wall calcification precludes accurate ankle pressures and ABIs.  Summary: Right: Resting right ankle-brachial index indicates noncompressible right lower extremity arteries. The right toe-brachial index is abnormal. Left: Resting left ankle-brachial index is within normal range. The left toe-brachial index is abnormal. *See table(s) above for measurements and observations.  Electronically signed by Festus Barren MD on 04/19/2023 at 9:52:05 AM.    Final    MR FOOT RIGHT W WO CONTRAST  Result Date: 04/19/2023 CLINICAL DATA:  Foot pain, swelling and open wound. History of amputation 3 weeks ago. EXAM: MRI OF THE RIGHT FOREFOOT WITHOUT AND WITH CONTRAST TECHNIQUE: Multiplanar, multisequence MR imaging of the right foot was performed before and after the administration of intravenous contrast. CONTRAST:  8mL GADAVIST GADOBUTROL 1 MMOL/ML IV SOLN COMPARISON:  MRI 03/20/2023 FINDINGS: Surgical changes related to recent amputation of the fifth toe and partial amputation of the fifth metatarsal. There are 2 small open wounds noted on the lateral  aspect of the forefoot with significant surrounding changes of cellulitis and enhancing granulation tissue. No discrete abscess is identified. There is diffuse abnormal T2 signal intensity and enhancement in the remaining fifth metatarsal suggesting osteomyelitis. The other bony structures are intact. There is diffuse cellulitis and moderate myositis without findings for pyomyositis. IMPRESSION: 1. Surgical changes related to recent amputation of the fifth toe and partial amputation of the fifth metatarsal. 2. 2 small open wounds on the lateral aspect of the forefoot with significant surrounding changes of cellulitis and enhancing granulation tissue. No discrete abscess. 3. Diffuse abnormal T2 signal intensity and enhancement in the remaining fifth metatarsal suggesting osteomyelitis. 4. Diffuse cellulitis and moderate myositis without findings for pyomyositis. Electronically Signed   By: Rudie Meyer M.D.   On: 04/19/2023 07:59   PERIPHERAL VASCULAR CATHETERIZATION  Result Date: 03/23/2023 See surgical note for result.  DG Foot Complete Right  Result Date: 03/22/2023 CLINICAL DATA:  Postop EXAM: RIGHT FOOT COMPLETE - 3+ VIEW COMPARISON:  Radiographs dated March 20, 2023 FINDINGS: Postsurgical changes for fifth ray transmetatarsal amputation. Soft tissue swelling generalized soft tissue edema about lateral aspect of the foot. Prominent vascular calcifications. IMPRESSION: Postsurgical changes for fifth ray transmetatarsal amputation. Electronically Signed   By: Larose Hires D.O.   On: 03/22/2023 09:38   MR FOOT RIGHT WO CONTRAST  Result Date: 03/20/2023 CLINICAL DATA:  Lateral plantar foot ulcer.  EXAM: MRI OF THE RIGHT FOREFOOT WITHOUT CONTRAST TECHNIQUE: Multiplanar, multisequence MR imaging of the right forefoot was performed. No intravenous contrast was administered. COMPARISON:  Right foot x-rays from same day. FINDINGS: Bones/Joint/Cartilage No marrow signal abnormality. No fracture or dislocation.  Minimal degenerative changes of the first MTP joint. Small fifth MTP joint effusion. Ligaments Collateral ligaments are intact. Muscles and Tendons Flexor and extensor tendons are intact. No tenosynovitis. Complete fatty atrophy of the of the intrinsic foot muscles. Soft tissue Ulceration along the plantar aspect of the fifth metatarsal head and fifth MTP joint with underlying 2.0 x 0.6 x 2.5 cm fluid collection (series 11, image 26; series 9, image 18). Ulceration extends to the fifth MTP joint capsule. Prominent dorsal foot soft tissue swelling. No soft tissue mass. IMPRESSION: 1. Deep ulceration along the plantar aspect of the fifth metatarsal head and fifth MTP joint with underlying 2.5 cm abscess. 2. Ulceration extends to the fifth MTP joint capsule and there is an underlying small joint effusion, which may be reactive, although early septic arthritis is not excluded. No evidence of osteomyelitis. Electronically Signed   By: Obie Dredge M.D.   On: 03/20/2023 15:31   DG Foot Complete Right  Result Date: 03/20/2023 CLINICAL DATA:  Lateral plantar foot wound. EXAM: RIGHT FOOT COMPLETE - 3+ VIEW COMPARISON:  None Available. FINDINGS: Superficial plantar soft tissue ulceration at the level of the fifth MTP joint with associated soft tissue swelling. No underlying bony destruction or periosteal reaction. No acute fracture or dislocation. Minimal degenerative changes of the first MTP joint. Bone mineralization is normal. IMPRESSION: 1. Superficial plantar soft tissue ulceration at the level of the fifth MTP joint. No radiographic evidence of osteomyelitis. Electronically Signed   By: Obie Dredge M.D.   On: 03/20/2023 13:51    I have personally spent 85 minutes involved in face-to-face and non-face-to-face activities for this patient on the day of the visit. Professional time spent includes the following activities: Preparing to see the patient (review of tests), Obtaining and/or reviewing separately  obtained history (admission/discharge record), Performing a medically appropriate examination and/or evaluation , Ordering medications/tests/procedures, referring and communicating with other health care professionals, Documenting clinical information in the EMR, Independently interpreting results (not separately reported), Communicating results to the patient/family/caregiver, Counseling and educating the patient/family/caregiver and Care coordination (not separately reported).  Electronically signed by:   Plan d/w requesting provider as well as ID pharm D  Note: This document was prepared using dragon voice recognition software and may include unintentional dictation errors.   Odette Fraction, MD Infectious Disease Physician [facility] Pager: [phone]

## 2023-04-19 NOTE — Consult Note (Addendum)
Pharmacy Antibiotic Note  Kenneth Duarte is a 77 y.o. male admitted on 04/18/2023 with  wound infection/osteomyelitis .  Pharmacy has been consulted for Zosyn and Vancomycin dosing.  Plan: Continue Zosyn 3.375gm iV q 8hrs (4hrs infusion) Vancomycin 1750 mg IV x given in ED on 5/1 @1733  Start  Vancomycin 1500 Q 24 hrs. Goal AUC 400-550. Expected AUC: 512 Expected Css min:12.3 SCr used: 1.34   Height: 5\' 11"  (180.3 cm) Weight: 86.2 kg (190 lb 0.6 oz) IBW/kg (Calculated) : 75.3  Temp (24hrs), Avg:98 F (36.7 C), Min:97.7 F (36.5 C), Max:98.5 F (36.9 C)  Recent Labs  Lab 04/18/23 1521 04/18/23 1819 04/19/23 0426 04/19/23 0844  WBC 9.2  --  4.9 5.3  CREATININE 2.24*  --   --   --   LATICACIDVEN 1.7 1.7  --   --     Estimated Creatinine Clearance: 30.8 mL/min (A) (by C-G formula based on SCr of 2.24 mg/dL (H)).    Allergies  Allergen Reactions   Blood-Group Specific Substance Other (See Comments)    Patient received almost 2 units FFP during a procedure. Became hypotensive and developed rash   Cortisone Acetate [Cortisone] Rash and Other (See Comments)    Antimicrobials this admission: 5/1 Ceftriaxone x 1 dose 5/1 Zosyn >>  5/1 Vancomycin >>  Dose adjustments this admission:n/a  Microbiology results: 5/1 BCx: NG < 24hrs 5/2 MRSA PCR: Negative  Thank you for allowing pharmacy to be a part of this patient's care.  Vyctoria Dickman Rodriguez-Guzman PharmD, BCPS 04/19/2023 9:40 AM

## 2023-04-19 NOTE — H&P (View-Only) (Signed)
 Subjective/Chief Complaint: Kenneth Duarte seen.  Complaining of some pain.  States he gets his pain pills every 4 hours at home.   Objective: Vital signs in last 24 hours: Temp:  [97.7 F (36.5 C)-98.5 F (36.9 C)] 98.5 F (36.9 C) (05/02 0728) Pulse Rate:  [65-81] 69 (05/02 0728) Resp:  [12-20] 18 (05/02 0728) BP: (90-132)/(50-85) 131/53 (05/02 0728) SpO2:  [95 %-100 %] 96 % (05/02 0728) Weight:  [86.2 kg] 86.2 kg (05/01 2306)    Intake/Output from previous day: 05/01 0701 - 05/02 0700 In: 520.7 [P.O.:120; IV Piggyback:400.7] Out: 500 [Urine:500] Intake/Output this shift: No intake/output data recorded.  Dressing intact on the right foot.  Kenneth Duarte does have full-thickness wound along the right fifth metatarsal area probing down to bone related to previous recent surgery.  MRI shows osteomyelitis of the remaining fifth metatarsal.  Granular wound also noted at the distal aspect where fifth toe and ray resection was performed which is stable.  Lab Results:  Recent Labs    04/19/23 0426 04/19/23 0844  WBC 4.9 5.3  HGB 9.4* 11.0*  HCT 28.7* 33.0*  PLT 111* 146*   BMET Recent Labs    04/18/23 1521  NA 135  K 4.4  CL 102  CO2 22  GLUCOSE 129*  BUN 26*  CREATININE 2.24*  CALCIUM 8.2*   PT/INR Recent Labs    04/18/23 1521 04/19/23 0426  LABPROT 14.9 16.1*  INR 1.2 1.3*   ABG No results for input(s): "PHART", "HCO3" in the last 72 hours.  Invalid input(s): "PCO2", "PO2"  Studies/Results: MR FOOT RIGHT W WO CONTRAST  Result Date: 04/19/2023 CLINICAL DATA:  Foot pain, swelling and open wound. History of amputation 3 weeks ago. EXAM: MRI OF THE RIGHT FOREFOOT WITHOUT AND WITH CONTRAST TECHNIQUE: Multiplanar, multisequence MR imaging of the right foot was performed before and after the administration of intravenous contrast. CONTRAST:  8mL GADAVIST GADOBUTROL 1 MMOL/ML IV SOLN COMPARISON:  MRI 03/20/2023 FINDINGS: Surgical changes related to recent amputation of  the fifth toe and partial amputation of the fifth metatarsal. There are 2 small open wounds noted on the lateral aspect of the forefoot with significant surrounding changes of cellulitis and enhancing granulation tissue. No discrete abscess is identified. There is diffuse abnormal T2 signal intensity and enhancement in the remaining fifth metatarsal suggesting osteomyelitis. The other bony structures are intact. There is diffuse cellulitis and moderate myositis without findings for pyomyositis. IMPRESSION: 1. Surgical changes related to recent amputation of the fifth toe and partial amputation of the fifth metatarsal. 2. 2 small open wounds on the lateral aspect of the forefoot with significant surrounding changes of cellulitis and enhancing granulation tissue. No discrete abscess. 3. Diffuse abnormal T2 signal intensity and enhancement in the remaining fifth metatarsal suggesting osteomyelitis. 4. Diffuse cellulitis and moderate myositis without findings for pyomyositis. Electronically Signed   By: P.  Gallerani M.D.   On: 04/19/2023 07:59    Anti-infectives: Anti-infectives (From admission, onward)    Start     Dose/Rate Route Frequency Ordered Stop   04/18/23 2200  piperacillin-tazobactam (ZOSYN) IVPB 3.375 g        3.375 g 12.5 mL/hr over 240 Minutes Intravenous Every 8 hours 04/18/23 2100     04/18/23 1630  vancomycin (VANCOREADY) IVPB 1750 mg/350 mL        1,750 mg 175 mL/hr over 120 Minutes Intravenous  Once 04/18/23 1618 04/18/23 2017   04/18/23 1615  vancomycin (VANCOCIN) IVPB 1000 mg/200 mL premix  Status:    Discontinued        1,000 mg 200 mL/hr over 60 Minutes Intravenous  Once 04/18/23 1608 04/18/23 1618   04/18/23 1615  cefTRIAXone (ROCEPHIN) 2 g in sodium chloride 0.9 % 100 mL IVPB        2 g 200 mL/hr over 30 Minutes Intravenous  Once 04/18/23 1608 04/18/23 1729       Assessment/Plan: s/p Procedure(s): INCISION AND DRAINAGE (Right) Assessment: 1.  Continued osteomyelitis right  fifth metatarsal.  2.  Diabetes with associated neuropathy. 3.  Peripheral vascular disease.  Plan: Discussed with the Kenneth Duarte the need for removal of the remainder of his fifth metatarsal.  Discussed possible risks and complications of the procedure including inability of the wound to heal due to his vascular status or diabetes.  Discussed that removing the remainder of the fifth metatarsal may affect his foot function having to sacrifice the attachment of the peroneal tendon.  In the future this could lead to need for further amputation.  No guarantees could be given as to the outcome.  Questions invited and answered.  Obtain consent for I&D right foot soft tissue and bone.  Kenneth Duarte is NPO.  Plan for surgery later this evening.  LOS: 0 days    Charlise Giovanetti W Jakyla Reza 04/19/2023  

## 2023-04-19 NOTE — Op Note (Signed)
Date of operation: 04/19/2023.  Surgeon: Ricci Barker D.P.M.  Preoperative diagnosis: Osteomyelitis right fifth metatarsal.  Postoperative diagnosis: Same.  Procedure: Incision and drainage soft tissue and bone right foot.  Anesthesia: Local MAC.  Hemostasis: Esmarch bandage right ankle.  Estimated blood loss: Less than 5 cc.  Cultures: Bone culture right fifth metatarsal.  Pathology: Right fifth metatarsal base.  Implants: Stimulan rapid cure antibiotic beads impregnated with vancomycin.  Complications: None apparent.  Operative indications: This is a 77 year old diabetic male with recent fifth ray resection on the right foot.  Wound recently dehisced and opened up with extension down to the level of the bone and decision was made for repeat debridement.  MRI showed involvement of the entire rest of the fifth metatarsal base.  Operative procedure: Patient was taken to the operating room and placed on the table in supine position.  Following satisfactory sedation the right foot was anesthetized with 10 cc of 0.5% Marcaine plain around the lateral aspect of the foot.  The foot was then prepped and draped in usual sterile fashion.  Foot was exsanguinated using an Esmarch which was then left intact around the ankle to act as a tourniquet.  Attention directed to the lateral aspect of the right foot where there was noted to be a full-thickness open ulceration.  Incision was extended proximally approximately 4 cm.  Incision carried down to the fifth metatarsal which was then freed from the normal surrounding anatomy using a Beaver blade and Art therapist.  Fifth metatarsal was then removed in toto.  The wound was then debrided using a Versajet debrider on a setting of 4 and then thoroughly irrigated under pulse irrigation.  The open wound from his previous surgery was also Versajet did on a setting of 4 and it did connect with the more proximal area.  Stimulan rapid cure antibiotic beads placed  within the wound and the incision was closed using 3-0 nylon simple interrupted sutures with 1 figure-of-eight suture around his previous ulcerative area.  Xeroform 4 x 4's ABDs and Kerlix then applied to the right lower extremity.  Tourniquet was released and a second Kerlix and Ace wrap then applied for compression.  Patient was awakened and transported to the PACU having tolerated the anesthesia and procedure well.

## 2023-04-19 NOTE — Consult Note (Signed)
Pharmacy Antibiotic Note  Kenneth Duarte is a 77 y.o. male admitted on 04/18/2023 with  wound infection/osteomyelitis .  Pharmacy has been consulted for Daptomycin dosing. Patient with cultures form 03/21/23 from right foot with MSSA and amp-susc E faecalis.  Patient on doxycyline and amoxicillin/clavulanate in the last month. ID has been consulted.  Plan: Start Daptomycin  700mg  (8mg /kg) IV q24h for osteomyelitis Check CK 5/2 (baseline =70) then at least weekly if remains on daptomycin  Note on atorvastatin 80mg  Continue ampicillin/sulbactam 3gm IV q6h Follow renal function Follow cultures, for I&D 5/2 per podiatry   Height: 5\' 11"  (180.3 cm) Weight: 86.2 kg (190 lb 0.6 oz) IBW/kg (Calculated) : 75.3  Temp (24hrs), Avg:98 F (36.7 C), Min:97.7 F (36.5 C), Max:98.5 F (36.9 C)  Recent Labs  Lab 04/18/23 1521 04/18/23 1819 04/19/23 0426 04/19/23 0844  WBC 9.2  --  4.9 5.3  CREATININE 2.24*  --   --  1.34*  LATICACIDVEN 1.7 1.7  --   --      Estimated Creatinine Clearance: 51.5 mL/min (A) (by C-G formula based on SCr of 1.34 mg/dL (H)).    Allergies  Allergen Reactions   Blood-Group Specific Substance Other (See Comments)    Patient received almost 2 units FFP during a procedure. Became hypotensive and developed rash   Cortisone Acetate [Cortisone] Rash and Other (See Comments)    Antimicrobials this admission: 5/1 Ceftriaxone x 1 dose 5/1 Zosyn >> 5/2 5/1 Vancomycin >>5/2 5/2 amp/sulb >> 5/2 daptomycin >>  Dose adjustments this admission:n/a  Microbiology results: 5/1 BCx: NG < 24hrs 5/2 MRSA PCR: Negative  Thank you for allowing pharmacy to be a part of this patient's care.  Juliette Alcide, PharmD, BCPS, BCIDP Work Cell: [phone] 04/19/2023 12:43 PM

## 2023-04-19 NOTE — Progress Notes (Addendum)
Subjective/Chief Complaint: Patient seen.  Complaining of some pain.  States he gets his pain pills every 4 hours at home.   Objective: Vital signs in last 24 hours: Temp:  [97.7 F (36.5 C)-98.5 F (36.9 C)] 98.5 F (36.9 C) (05/02 0728) Pulse Rate:  [65-81] 69 (05/02 0728) Resp:  [12-20] 18 (05/02 0728) BP: (90-132)/(50-85) 131/53 (05/02 0728) SpO2:  [95 %-100 %] 96 % (05/02 0728) Weight:  [86.2 kg] 86.2 kg (05/01 2306)    Intake/Output from previous day: 05/01 0701 - 05/02 0700 In: 520.7 [P.O.:120; IV Piggyback:400.7] Out: 500 [Urine:500] Intake/Output this shift: No intake/output data recorded.  Dressing intact on the right foot.  Patient does have full-thickness wound along the right fifth metatarsal area probing down to bone related to previous recent surgery.  MRI shows osteomyelitis of the remaining fifth metatarsal.  Granular wound also noted at the distal aspect where fifth toe and ray resection was performed which is stable.  Lab Results:  Recent Labs    04/19/23 0426 04/19/23 0844  WBC 4.9 5.3  HGB 9.4* 11.0*  HCT 28.7* 33.0*  PLT 111* 146*   BMET Recent Labs    04/18/23 1521  NA 135  K 4.4  CL 102  CO2 22  GLUCOSE 129*  BUN 26*  CREATININE 2.24*  CALCIUM 8.2*   PT/INR Recent Labs    04/18/23 1521 04/19/23 0426  LABPROT 14.9 16.1*  INR 1.2 1.3*   ABG No results for input(s): "PHART", "HCO3" in the last 72 hours.  Invalid input(s): "PCO2", "PO2"  Studies/Results: MR FOOT RIGHT W WO CONTRAST  Result Date: 04/19/2023 CLINICAL DATA:  Foot pain, swelling and open wound. History of amputation 3 weeks ago. EXAM: MRI OF THE RIGHT FOREFOOT WITHOUT AND WITH CONTRAST TECHNIQUE: Multiplanar, multisequence MR imaging of the right foot was performed before and after the administration of intravenous contrast. CONTRAST:  8mL GADAVIST GADOBUTROL 1 MMOL/ML IV SOLN COMPARISON:  MRI 03/20/2023 FINDINGS: Surgical changes related to recent amputation of  the fifth toe and partial amputation of the fifth metatarsal. There are 2 small open wounds noted on the lateral aspect of the forefoot with significant surrounding changes of cellulitis and enhancing granulation tissue. No discrete abscess is identified. There is diffuse abnormal T2 signal intensity and enhancement in the remaining fifth metatarsal suggesting osteomyelitis. The other bony structures are intact. There is diffuse cellulitis and moderate myositis without findings for pyomyositis. IMPRESSION: 1. Surgical changes related to recent amputation of the fifth toe and partial amputation of the fifth metatarsal. 2. 2 small open wounds on the lateral aspect of the forefoot with significant surrounding changes of cellulitis and enhancing granulation tissue. No discrete abscess. 3. Diffuse abnormal T2 signal intensity and enhancement in the remaining fifth metatarsal suggesting osteomyelitis. 4. Diffuse cellulitis and moderate myositis without findings for pyomyositis. Electronically Signed   By: Rudie Meyer M.D.   On: 04/19/2023 07:59    Anti-infectives: Anti-infectives (From admission, onward)    Start     Dose/Rate Route Frequency Ordered Stop   04/18/23 2200  piperacillin-tazobactam (ZOSYN) IVPB 3.375 g        3.375 g 12.5 mL/hr over 240 Minutes Intravenous Every 8 hours 04/18/23 2100     04/18/23 1630  vancomycin (VANCOREADY) IVPB 1750 mg/350 mL        1,750 mg 175 mL/hr over 120 Minutes Intravenous  Once 04/18/23 1618 04/18/23 2017   04/18/23 1615  vancomycin (VANCOCIN) IVPB 1000 mg/200 mL premix  Status:  Discontinued        1,000 mg 200 mL/hr over 60 Minutes Intravenous  Once 04/18/23 1608 04/18/23 1618   04/18/23 1615  cefTRIAXone (ROCEPHIN) 2 g in sodium chloride 0.9 % 100 mL IVPB        2 g 200 mL/hr over 30 Minutes Intravenous  Once 04/18/23 1608 04/18/23 1729       Assessment/Plan: s/p Procedure(s): INCISION AND DRAINAGE (Right) Assessment: 1.  Continued osteomyelitis right  fifth metatarsal.  2.  Diabetes with associated neuropathy. 3.  Peripheral vascular disease.  Plan: Discussed with the patient the need for removal of the remainder of his fifth metatarsal.  Discussed possible risks and complications of the procedure including inability of the wound to heal due to his vascular status or diabetes.  Discussed that removing the remainder of the fifth metatarsal may affect his foot function having to sacrifice the attachment of the peroneal tendon.  In the future this could lead to need for further amputation.  No guarantees could be given as to the outcome.  Questions invited and answered.  Obtain consent for I&D right foot soft tissue and bone.  Patient is NPO.  Plan for surgery later this evening.  LOS: 0 days    Ricci Barker 04/19/2023

## 2023-04-19 NOTE — TOC Progression Note (Signed)
Transition of Care [facility]) - Progression Note    Patient Details  Name: Kenneth Duarte MRN: [id number] Date of Birth: 1946/06/15  [facility]) CM/SW Contact  Marlowe Sax, RN Phone Number: 04/19/2023, 9:55 AM  Clinical Narrative:    The patient comes from home,  Had right partial fifth ray amputation on March 22, 2023  MRI was done, IV ABX is being administered, TOC to follow for needs  Expected Discharge Plan: Home/Self Care Barriers to Discharge: Continued Medical Work up  Expected Discharge Plan and Services       Living arrangements for the past 2 months: Single Family Home                                       Social Determinants of Health (SDOH) Interventions SDOH Screenings   Food Insecurity: No Food Insecurity (04/18/2023)  Housing: Low Risk  (04/18/2023)  Transportation Needs: No Transportation Needs (04/18/2023)  Utilities: Not At Risk (04/18/2023)  Tobacco Use: Medium Risk (04/19/2023)    Readmission Risk Interventions     No data to display

## 2023-04-19 NOTE — Transfer of Care (Signed)
Immediate Anesthesia Transfer of Care Note  Patient: Kenneth Duarte  Procedure(s) Performed: INCISION AND DRAINAGE (Right: Foot)  Patient Location: PACU  Anesthesia Type:General  Level of Consciousness: drowsy and patient cooperative  Airway & Oxygen Therapy: Patient Spontanous Breathing and Patient connected to nasal cannula oxygen  Post-op Assessment: Report given to RN and Post -op Vital signs reviewed and stable  Post vital signs: Reviewed and stable  Last Vitals:  Vitals Value Taken Time  BP 160/61 04/19/23 1815  Temp 36.4 C 04/19/23 1815  Pulse 79 04/19/23 1827  Resp 15 04/19/23 1827  SpO2 95 % 04/19/23 1827  Vitals shown include unvalidated device data.  Last Pain:  Vitals:   04/19/23 1815  TempSrc:   PainSc: 0-No pain      Patients Stated Pain Goal: 4 (04/18/23 2306)  Complications: No notable events documented.

## 2023-04-19 NOTE — Progress Notes (Signed)
PHARMACIST - PHYSICIAN COMMUNICATION  CONCERNING:  Enoxaparin (Lovenox) for DVT Prophylaxis    RECOMMENDATION: Patient was prescribed enoxaprin 30mg  q24 hours for VTE prophylaxis.   Filed Weights   04/18/23 1508 04/18/23 2306  Weight: 86.2 kg (190 lb) 86.2 kg (190 lb 0.6 oz)    Body mass index is 26.5 kg/m.  Estimated Creatinine Clearance: 51.5 mL/min (A) (by C-G formula based on SCr of 1.34 mg/dL (H)).   Based on [facility] policy patient is candidate for enoxaparin 40mg  every 24 hours based on CrCl > 8ml/min or Weight > 45kg  DESCRIPTION: Pharmacy has adjusted enoxaparin dose per [facility] policy.  Patient is now receiving enoxaparin 40 mg every 24 hours (noted surgery planed for later this evening, new dose to start tomorrow.   Traniya Prichett Rodriguez-Guzman PharmD, BCPS 04/19/2023 10:02 AM

## 2023-04-19 NOTE — Plan of Care (Signed)
Patient A&Ox4, from home, up with assist in room. Pt had I&D of right foot earlier this shift. Patient c/o acute/surgical pain, medicated with partial relief. IV fluids and abx continued. Dressing to surgical site C/D/I.

## 2023-04-19 NOTE — Progress Notes (Signed)
[facility] Progress Note  Patient: Kenneth Duarte    XBJ:[id number]  DOA: 04/18/2023     Date of Service: the patient was seen and examined on 04/19/2023  Chief Complaint  Patient presents with   Wound Infection   Brief hospital course: Chanz Cahall is a 77 y.o. 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  As per patient since surgery wound is not healing, patient is following podiatry and he was sent to ED for admission and further management. ED workup: Hypertension BP 90/50, AKI creatinine 2.24, anemia Hb 9.4, mild thrombocytopenia platelet 111 MRI right foot shows acute osteomyelitis of remaining fifth metatarsal bone [facility] hospitalist consulted for admission and further management as below.  Assessment and Plan:  Right fifth metatarsal osteomyelitis S/p right fifth ray amputation ID consulted, started Unasyn and daptomycin Follow surgical cultures Podiatry consulted, plan is for surgical resection today Continue as needed medication for pain control Discharge planning when cleared by podiatry and ID PT/OT consult after surgical intervention   AKI most likely due to dehydration Creatinine 2.24 on admission Monitor renal functions IV fluid for hydration Avoid nephrotoxic medications, use renally dose medications Monitor output   CAD s/p CABG, HTN, HLT, PAD s/p LRE stent insertion Patient follows with vascular surgery Dr. Wyn Quaker Continue aspirin, Plavix and statin Blood pressure was low on admission, so held Coreg, amlodipine- benazepril Use IV hydralazine as needed Monitor BP and titrate medications accordingly   NIDDM T2 with peripheral neuropathy, involving bilateral feet held home medications for now Continue NovoLog sliding scale Continue gabapentin home dose for peripheral neuropathy Monitor CBG, continue  diabetic diet  Iron deficiency anemia, iron transferrin saturation 7% Started oral iron on discharge Monitor H&H and transfuse if hemoglobin less than 7  Body mass index is 26.5 kg/m.  Interventions:    Diet: NPO for now DVT Prophylaxis: Subcutaneous Lovenox   Advance goals of care discussion: Full code  Family Communication: family was present at bedside, at the time of interview.  The pt provided permission to discuss medical plan with the family. Opportunity was given to ask question and all questions were answered satisfactorily.   Disposition:  Pt is from Home, admitted with right fifth metatarsal osteomyelitis, needs surgical intervention, on IV antibiotics, which precludes a safe discharge. Discharge to Home, when stable and cleared by podiatry and ID.  Subjective: No significant events overnight, patient does not feel any pain due to peripheral neuropathy, no chest pain or pressure no shortness of breath.  Denies any active issues, patient is aware that he is scheduled for surgical intervention in the evening, patient was advised to remain NPO.  Physical Exam: General: NAD, lying comfortably Appear in no distress, affect appropriate Eyes: PERRLA ENT: Oral Mucosa Clear, moist  Neck: no JVD,  Cardiovascular: S1 and S2 Present, no Murmur,  Respiratory: good respiratory effort, Bilateral Air entry equal and Decreased, no Crackles, no wheezes Abdomen: Bowel Sound present, Soft and no tenderness,  Skin: no rashes Extremities: no Pedal edema, no calf tenderness, right foot infection, dressing CDI Neurologic: without any new focal findings Gait not checked due to patient safety concerns  Vitals:   04/18/23 2038 04/18/23 2130 04/18/23 2306 04/19/23 0728  BP: (!) 130/59 (!) 110/53 125/63 (!) 131/53  Pulse: 76 65 80 69  Resp: 16 18 20 18   Temp:  98  F (36.7 C) 97.8 F (36.6 C) 98.5 F (36.9 C)  TempSrc:      SpO2: 100% 99% 97% 96%  Weight:   86.2 kg   Height:   5\' 11"   (1.803 m)     Intake/Output Summary (Last 24 hours) at 04/19/2023 1446 Last data filed at 04/19/2023 0530 Gross per 24 hour  Intake 520.72 ml  Output 500 ml  Net 20.72 ml   Filed Weights   04/18/23 1508 04/18/23 2306  Weight: 86.2 kg 86.2 kg    Data Reviewed: I have personally reviewed and interpreted daily labs, tele strips, imagings as discussed above. I reviewed all nursing notes, pharmacy notes, vitals, pertinent old records I have discussed plan of care as described above with RN and patient/family.  CBC: Recent Labs  Lab 04/18/23 1521 04/19/23 0426 04/19/23 0844  WBC 9.2 4.9 5.3  NEUTROABS 7.1  --   --   HGB 11.1* 9.4* 11.0*  HCT 33.2* 28.7* 33.0*  MCV 88.5 89.7 89.7  PLT 157 111* 146*   Basic Metabolic Panel: Recent Labs  Lab 04/18/23 1521 04/19/23 0844  NA 135 138  K 4.4 4.4  CL 102 111  CO2 22 19*  GLUCOSE 129* 130*  BUN 26* 15  CREATININE 2.24* 1.34*  CALCIUM 8.2* 7.9*  MG  --  1.7  PHOS  --  2.7    Studies: MR FOOT RIGHT W WO CONTRAST  Result Date: 04/19/2023 CLINICAL DATA:  Foot pain, swelling and open wound. History of amputation 3 weeks ago. EXAM: MRI OF THE RIGHT FOREFOOT WITHOUT AND WITH CONTRAST TECHNIQUE: Multiplanar, multisequence MR imaging of the right foot was performed before and after the administration of intravenous contrast. CONTRAST:  8mL GADAVIST GADOBUTROL 1 MMOL/ML IV SOLN COMPARISON:  MRI 03/20/2023 FINDINGS: Surgical changes related to recent amputation of the fifth toe and partial amputation of the fifth metatarsal. There are 2 small open wounds noted on the lateral aspect of the forefoot with significant surrounding changes of cellulitis and enhancing granulation tissue. No discrete abscess is identified. There is diffuse abnormal T2 signal intensity and enhancement in the remaining fifth metatarsal suggesting osteomyelitis. The other bony structures are intact. There is diffuse cellulitis and moderate myositis without findings for  pyomyositis. IMPRESSION: 1. Surgical changes related to recent amputation of the fifth toe and partial amputation of the fifth metatarsal. 2. 2 small open wounds on the lateral aspect of the forefoot with significant surrounding changes of cellulitis and enhancing granulation tissue. No discrete abscess. 3. Diffuse abnormal T2 signal intensity and enhancement in the remaining fifth metatarsal suggesting osteomyelitis. 4. Diffuse cellulitis and moderate myositis without findings for pyomyositis. Electronically Signed   By: Rudie Meyer M.D.   On: 04/19/2023 07:59    Scheduled Meds:  aspirin EC  81 mg Oral Daily   atorvastatin  80 mg Oral QHS   cholecalciferol  1,000 Units Oral Daily   clopidogrel  75 mg Oral Daily   cyanocobalamin  1,000 mcg Oral Daily   [START ON 04/20/2023] enoxaparin (LOVENOX) injection  40 mg Subcutaneous Q24H   gabapentin  800 mg Oral TID   insulin aspart  0-15 Units Subcutaneous TID WC   insulin aspart  0-5 Units Subcutaneous QHS   mupirocin ointment  1 Application Nasal BID   pantoprazole  40 mg Oral Daily   sodium chloride flush  3 mL Intravenous Q12H   Continuous Infusions:  ampicillin-sulbactam (UNASYN) IV 3 g (04/19/23 1240)   DAPTOmycin (CUBICIN) 700 mg  in sodium chloride 0.9 % IVPB     PRN Meds: acetaminophen, docusate sodium, hydrALAZINE **OR** hydrALAZINE, LORazepam, ondansetron (ZOFRAN) IV, oxyCODONE  Time spent: 35 minutes  Author: Gillis Santa. MD [facility] 04/19/2023 2:46 PM  To reach On-call, see care teams to locate the attending and reach out to them via [email or web address]. If 7PM-7AM, please contact night-coverage If you still have difficulty reaching the attending provider, please page the [facility] (Director on Call) for [facility] on amion for assistance.

## 2023-04-19 NOTE — Interval H&P Note (Signed)
History and Physical Interval Note:  04/19/2023 6:01 PM  Kenneth Duarte  has presented today for surgery, with the diagnosis of Osteomyelitis Right Foot.  The various methods of treatment have been discussed with the patient and family. After consideration of risks, benefits and other options for treatment, the patient has consented to  Procedure(s): INCISION AND DRAINAGE (Right) as a surgical intervention.  The patient's history has been reviewed, patient examined, no change in status, stable for surgery.  I have reviewed the patient's chart and labs.  Questions were answered to the patient's satisfaction.     Ricci Barker

## 2023-04-20 ENCOUNTER — Other Ambulatory Visit (HOSPITAL_COMMUNITY): Payer: Self-pay

## 2023-04-20 ENCOUNTER — Encounter: Payer: Self-pay | Admitting: Podiatry

## 2023-04-20 ENCOUNTER — Encounter: Payer: Self-pay | Admitting: Infectious Diseases

## 2023-04-20 ENCOUNTER — Encounter: Payer: Self-pay | Admitting: Oncology

## 2023-04-20 ENCOUNTER — Telehealth (HOSPITAL_COMMUNITY): Payer: Self-pay | Admitting: Pharmacy Technician

## 2023-04-20 DIAGNOSIS — E861 Hypovolemia: Secondary | ICD-10-CM | POA: Diagnosis not present

## 2023-04-20 DIAGNOSIS — E11628 Type 2 diabetes mellitus with other skin complications: Secondary | ICD-10-CM | POA: Diagnosis not present

## 2023-04-20 DIAGNOSIS — L03119 Cellulitis of unspecified part of limb: Secondary | ICD-10-CM | POA: Diagnosis not present

## 2023-04-20 DIAGNOSIS — M86171 Other acute osteomyelitis, right ankle and foot: Secondary | ICD-10-CM | POA: Diagnosis not present

## 2023-04-20 LAB — CBC
HCT: 29.1 % — ABNORMAL LOW (ref 39.0–52.0)
Hemoglobin: 9.6 g/dL — ABNORMAL LOW (ref 13.0–17.0)
MCH: 29.3 pg (ref 26.0–34.0)
MCHC: 33 g/dL (ref 30.0–36.0)
MCV: 88.7 fL (ref 80.0–100.0)
Platelets: 137 10*3/uL — ABNORMAL LOW (ref 150–400)
RBC: 3.28 MIL/uL — ABNORMAL LOW (ref 4.22–5.81)
RDW: 15.6 % — ABNORMAL HIGH (ref 11.5–15.5)
WBC: 5.3 10*3/uL (ref 4.0–10.5)
nRBC: 0 % (ref 0.0–0.2)

## 2023-04-20 LAB — MAGNESIUM: Magnesium: 1.5 mg/dL — ABNORMAL LOW (ref 1.7–2.4)

## 2023-04-20 LAB — BASIC METABOLIC PANEL
Anion gap: 8 (ref 5–15)
BUN: 12 mg/dL (ref 8–23)
CO2: 22 mmol/L (ref 22–32)
Calcium: 8 mg/dL — ABNORMAL LOW (ref 8.9–10.3)
Chloride: 108 mmol/L (ref 98–111)
Creatinine, Ser: 1.19 mg/dL (ref 0.61–1.24)
GFR, Estimated: >60 mL/min (ref >60)
Glucose, Bld: 118 mg/dL — ABNORMAL HIGH (ref 70–99)
Potassium: 4 mmol/L (ref 3.5–5.1)
Sodium: 138 mmol/L (ref 135–145)

## 2023-04-20 LAB — GLUCOSE, CAPILLARY
Glucose-Capillary: 121 mg/dL — ABNORMAL HIGH (ref 70–99)
Glucose-Capillary: 261 mg/dL — ABNORMAL HIGH (ref 70–99)

## 2023-04-20 LAB — PHOSPHORUS: Phosphorus: 3.2 mg/dL (ref 2.5–4.6)

## 2023-04-20 MED ORDER — AMOXICILLIN-POT CLAVULANATE 875-125 MG PO TABS: 1.0000 | ORAL_TABLET | Freq: Two times a day (BID) | ORAL | 0 refills | Status: AC

## 2023-04-20 MED ORDER — MAGNESIUM SULFATE 4 GM/100ML IV SOLN
4.0000 g | Freq: Once | INTRAVENOUS | Status: AC
  Administered 2023-04-20: 4 g via INTRAVENOUS
  Filled 2023-04-20: qty 100

## 2023-04-20 MED ORDER — LINEZOLID 600 MG PO TABS: 600.0000 mg | ORAL_TABLET | Freq: Two times a day (BID) | ORAL | 0 refills | Status: AC

## 2023-04-20 NOTE — Discharge Instructions (Addendum)
Betadine gauze applied to the incision area followed by a bulky sterile bandage with Ace wrap. Patient instructed only to weight-bear on the heel of his right foot using crutches.     While on the antibiotic linezolid avoid or minimize following foods and drinks as they may interact with linezolid.  These foods and drinks contain tyramine.  Tyramine is a natural product found in some plants and animals.  Too much tyramine in combination with linezolid can cause high levels of serotonin in the body.  Serotonin is a chemical in our body that controls mood, sleep, digestion, and other functions.  Several signs of too much serotonin in the body (also called serotonin syndrome) are fast heart rate, sweating, fevers, high blood pressure, muscle twitching, and confusion.  It is important to seek medical attention if you have these symptoms.   Avoid foods and beverages that are high in tyramine while taking linezolid, including: Alcoholic beverages (such as beer, red wine, vermouth, sherry) Citrus or tropical fruits (oranges, grapefruit, lemon, lime, tangerine, ripe bananas, ripe pineapple, and ripe avocado) Aged cheeses (such as cheddar, blue cheese, swiss, feta, parmesan, camembert) Fermented or pickled foods (such as sauerkraut, pickled beets, pickled peppers, pickled cucumbers/pickles, kim chee/kimchi)  Dried/aged, smoked or processed meats and sausages (such as salami, pepperoni, liverwurst, hot dogs, bologna, bacon) Soybean products (such as soy sauce, tofu) Preserved fish (such as pickled herring) Products that contain large amounts of yeast (such as bouillon cubes, powdered soup/gravy, homemade or sourdough bread)  Broad/fava beans  Following foods are OK to eat while taking linezolid: Pasteurized cheeses (such as [place], ricotta, cottage cheese, cream cheese) Vegetables (not fermented or pickled) Non-cured or smoked meats

## 2023-04-20 NOTE — Telephone Encounter (Signed)
Pharmacy Patient Advocate Encounter  Insurance verification completed.    The patient is insured through [employer] Part D   The patient is currently admitted and ran test claims for the following: linezolid (Zyvox).  Copays and coinsurance results were relayed to Inpatient clinical team. 

## 2023-04-20 NOTE — Discharge Summary (Signed)
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  As per patient since surgery wound is not healing, patient is following podiatry and he was sent to ED for admission and further management. ED workup: Hypertension BP 90/50, AKI creatinine 2.24, anemia Hb 9.4, mild thrombocytopenia platelet 111 MRI right foot shows acute osteomyelitis of remaining fifth metatarsal bone.  Patient was taken to the OR by podiatry with removal of the whole fifth ray, debridement.  Preliminary cultures with no growth.  Patient tolerated the procedure well.  As there is all the bone removed, ID is recommending 2 more weeks of Augmentin and linezolid.  Patient received Unasyn and daptomycin in the hospital.  Patient will be wearing weight only on right heel.  He was instructed how to change the dressing by podiatry.  He will continue the rest of his home medications and follow-up with his providers closely for further recommendations.  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  Patient has received vancomycin and ceftriaxone in the  ER.  We will continue with Zosyn.  Obviously we need to rule out any deeper infection of this postsurgical diabetic foot.  An MRI is pending and we will follow-up accordingly.  At this time no evidence of fever or white count and if MRI is negative I would favor further de-escalation of antibiotic therapy.  Hypotension In a patient with ongoing infection, Lasix use, AKI.  I suspect patient was hypovolemic especially due to lasix use and has a great response to IV fluids.  Resolved.   Pain control - [employer] Controlled Substance Reporting System database was reviewed. and patient was instructed, not to drive, operate heavy machinery, perform activities at heights, swimming or participation in water activities or provide baby-sitting services while on Pain, Sleep and Anxiety Medications; until their outpatient Physician has advised to do so again. Also recommended to not to take more than prescribed Pain, Sleep and Anxiety Medications.  Consultants: Podiatry.  Infectious disease Procedures performed: Debridement with incision and drainage of fifth ray of right foot Disposition: Home Diet recommendation:  Discharge Diet Orders (From admission, onward)     Start     Ordered   04/20/23 0000  Diet - low sodium heart healthy        04/20/23 1118           Cardiac and Carb modified diet DISCHARGE MEDICATION: Allergies as of 04/20/2023       Reactions   Blood-group Specific Substance Other (See Comments)   Patient received almost 2 units FFP during a procedure. Became hypotensive and developed rash  Cortisone Acetate [cortisone] Rash, Other (See Comments)        Medication List     STOP taking these medications    albuterol 108 (90 Base) MCG/ACT inhaler Commonly known as: VENTOLIN HFA       TAKE these medications    Accu-Chek Aviva Plus test strip Generic drug: glucose blood   Accu-Chek Softclix Lancets lancets   acetaminophen 500 MG tablet Commonly known as:  TYLENOL Take 2 tablets (1,000 mg total) by mouth every 6 (six) hours as needed.   amlodipine-benazepril 2.5-10 MG capsule Commonly known as: LOTREL Take 1 capsule by mouth daily.   amoxicillin-clavulanate 875-125 MG tablet Commonly known as: AUGMENTIN Take 1 tablet by mouth 2 (two) times daily for 13 days.   aspirin EC 81 MG tablet Take 1 tablet (81 mg total) by mouth daily.   atorvastatin 80 MG tablet Commonly known as: LIPITOR TAKE 1 TABLET AT BEDTIME   B-D SINGLE USE SWABS REGULAR Pads   carvedilol 12.5 MG tablet Commonly known as: COREG TAKE 1 TABLET TWICE DAILY WITH MEALS   clopidogrel 75 MG tablet Commonly known as: PLAVIX TAKE 1 TABLET EVERY DAY   cyanocobalamin 1000 MCG tablet Commonly known as: VITAMIN B12   docusate sodium 100 MG capsule Commonly known as: COLACE Take 100 mg by mouth 2 (two) times daily as needed.   FeroSul 325 (65 FE) MG tablet Generic drug: ferrous sulfate TAKE 1 TABLET EVERY DAY   fluticasone 50 MCG/ACT nasal spray Commonly known as: FLONASE Place into the nose.   furosemide 20 MG tablet Commonly known as: LASIX TAKE 1 TABLET EVERY DAY   gabapentin 800 MG tablet Commonly known as: NEURONTIN Take 1,600 mg by mouth 3 (three) times daily.   linezolid 600 MG tablet Commonly known as: ZYVOX Take 1 tablet (600 mg total) by mouth 2 (two) times daily for 13 days.   meclizine 25 MG tablet Commonly known as: ANTIVERT TAKE 1 TABLET TWICE DAILY AS NEEDED FOR DIZZINESS   metFORMIN 500 MG 24 hr tablet Commonly known as: GLUCOPHAGE-XR Take 500 mg by mouth 2 (two) times daily.   Narcan 4 MG/0.1ML Liqd nasal spray kit Generic drug: naloxone 1 spray once.   nitroGLYCERIN 0.4 MG SL tablet Commonly known as: NITROSTAT Place 1 tablet (0.4 mg total) under the tongue every 5 (five) minutes as needed for chest pain.   oxyCODONE-acetaminophen 10-325 MG tablet Commonly known as: PERCOCET Take 2 tablets by mouth every 6 (six) hours as needed  for pain.   pantoprazole 40 MG tablet Commonly known as: PROTONIX TAKE 1 TABLET EVERY DAY   valACYclovir 1000 MG tablet Commonly known as: VALTREX Take 1,000 mg by mouth as needed.   Vitamin D-1000 Max St 25 MCG (1000 UT) tablet Generic drug: Cholecalciferol Take 1,000 Units by mouth daily.        Follow-up Information     Aycock, Ngwe A, MD. Schedule an appointment as soon as possible for a visit in 1 week(s).   Specialty: Family Medicine Contact information: [address] [place] [address] [phone]         Linus Galas, DPM. Schedule an appointment as soon as possible for a visit in 1 week(s).   Specialty: Podiatry Contact information: [address] [place] [address] [phone]                Discharge Exam: Filed Weights   04/18/23 1508 04/18/23 2306  Weight: 86.2 kg 86.2 kg  General.  Well-developed gentleman, in no acute distress. Pulmonary.  Lungs clear bilaterally, normal respiratory effort. CV.  Regular rate and rhythm, no JVD, rub or murmur. Abdomen.  Soft, nontender, nondistended, BS positive. CNS.  Alert and oriented .  No focal neurologic deficit. Extremities.  No edema, no cyanosis, pulses intact and symmetrical.  Right foot with Ace wrap Psychiatry.  Judgment and insight appears normal.   Condition at discharge: stable  The results of significant diagnostics from this hospitalization (including imaging, microbiology, ancillary and laboratory) are listed below for reference.   Imaging Studies: VAS [place] ABI WITH/WO TBI  Result Date: 04/19/2023  LOWER EXTREMITY DOPPLER STUDY Patient Name:  Nikith Ralphs  Date of Exam:   04/17/2023 Medical Rec #: [id number]          Accession #:    [id number] Date of Birth: 10/18/1946          Patient Gender: M Patient Age:   80 years Exam [facility] Procedure:      VAS [place] ABI WITH/WO TBI Referring Phys: Barbara Cower DEW  --------------------------------------------------------------------------------  High Risk Factors: Hypertension, Diabetes, past history of smoking, prior MI.  Vascular Interventions: 03/23/2023 Right ATA angioplasty and right SFA stent. Performing Technologist: Hardie Lora RVT  Examination Guidelines: A complete evaluation includes at minimum, Doppler waveform signals and systolic blood pressure reading at the level of bilateral brachial, anterior tibial, and posterior tibial arteries, when vessel segments are accessible. Bilateral testing is considered an integral part of a complete examination. Photoelectric Plethysmograph (PPG) waveforms and toe systolic pressure readings are included as required and additional duplex testing as needed. Limited examinations for reoccurring indications may be performed as noted.  ABI Findings: +---------+------------------+-----+---------+--------+ Right    Rt Pressure (mmHg)IndexWaveform Comment  +---------+------------------+-----+---------+--------+ Brachial 172                                      +---------+------------------+-----+---------+--------+ ATA      255               1.48 biphasic          +---------+------------------+-----+---------+--------+ PTA      247               1.44 triphasic         +---------+------------------+-----+---------+--------+ Great Toe99                0.58                   +---------+------------------+-----+---------+--------+ +---------+------------------+-----+---------+-------+ Left     Lt Pressure (mmHg)IndexWaveform Comment +---------+------------------+-----+---------+-------+ Brachial 168                                     +---------+------------------+-----+---------+-------+ PTA      164               0.95 triphasic        +---------+------------------+-----+---------+-------+ DP       161               0.94 biphasic          +---------+------------------+-----+---------+-------+ Great Toe100               0.58                  +---------+------------------+-----+---------+-------+ +-------+----------------+-----------+------------+------------+ ABI/TBIToday's  ABI     Today's TBIPrevious ABIPrevious TBI +-------+----------------+-----------+------------+------------+ Right  Non compressible0.58                                +-------+----------------+-----------+------------+------------+ Left   0.95            0.58                                +-------+----------------+-----------+------------+------------+ Arterial wall calcification precludes accurate ankle pressures and ABIs.  Summary: Right: Resting right ankle-brachial index indicates noncompressible right lower extremity arteries. The right toe-brachial index is abnormal. Left: Resting left ankle-brachial index is within normal range. The left toe-brachial index is abnormal. *See table(s) above for measurements and observations.  Electronically signed by Festus Barren MD on 04/19/2023 at 9:52:05 AM.    Final    MR FOOT RIGHT W WO CONTRAST  Result Date: 04/19/2023 CLINICAL DATA:  Foot pain, swelling and open wound. History of amputation 3 weeks ago. EXAM: MRI OF THE RIGHT FOREFOOT WITHOUT AND WITH CONTRAST TECHNIQUE: Multiplanar, multisequence MR imaging of the right foot was performed before and after the administration of intravenous contrast. CONTRAST:  8mL GADAVIST GADOBUTROL 1 MMOL/ML IV SOLN COMPARISON:  MRI 03/20/2023 FINDINGS: Surgical changes related to recent amputation of the fifth toe and partial amputation of the fifth metatarsal. There are 2 small open wounds noted on the lateral aspect of the forefoot with significant surrounding changes of cellulitis and enhancing granulation tissue. No discrete abscess is identified. There is diffuse abnormal T2 signal intensity and enhancement in the remaining fifth metatarsal suggesting osteomyelitis. The  other bony structures are intact. There is diffuse cellulitis and moderate myositis without findings for pyomyositis. IMPRESSION: 1. Surgical changes related to recent amputation of the fifth toe and partial amputation of the fifth metatarsal. 2. 2 small open wounds on the lateral aspect of the forefoot with significant surrounding changes of cellulitis and enhancing granulation tissue. No discrete abscess. 3. Diffuse abnormal T2 signal intensity and enhancement in the remaining fifth metatarsal suggesting osteomyelitis. 4. Diffuse cellulitis and moderate myositis without findings for pyomyositis. Electronically Signed   By: Rudie Meyer M.D.   On: 04/19/2023 07:59   PERIPHERAL VASCULAR CATHETERIZATION  Result Date: 03/23/2023 See surgical note for result.  DG Foot Complete Right  Result Date: 03/22/2023 CLINICAL DATA:  Postop EXAM: RIGHT FOOT COMPLETE - 3+ VIEW COMPARISON:  Radiographs dated March 20, 2023 FINDINGS: Postsurgical changes for fifth ray transmetatarsal amputation. Soft tissue swelling generalized soft tissue edema about lateral aspect of the foot. Prominent vascular calcifications. IMPRESSION: Postsurgical changes for fifth ray transmetatarsal amputation. Electronically Signed   By: Larose Hires D.O.   On: 03/22/2023 09:38    Microbiology: Results for orders placed or performed during the hospital encounter of 04/18/23  Culture, blood (Routine x 2)     Status: None (Preliminary result)   Collection Time: 04/18/23  3:21 PM   Specimen: BLOOD  Result Value Ref Range Status   Specimen Description BLOOD LEFT ANTECUBITAL  Final   Special Requests   Final    BOTTLES DRAWN AEROBIC AND ANAEROBIC Blood Culture results may not be optimal due to an excessive volume of blood received in culture bottles   Culture   Final    NO GROWTH 2 DAYS Performed at [facility], [address], [place], [place] [address]    Report  Status PENDING  Incomplete  Culture, blood (Routine x 2)      Status: None (Preliminary result)   Collection Time: 04/18/23 11:18 PM   Specimen: BLOOD RIGHT HAND  Result Value Ref Range Status   Specimen Description BLOOD RIGHT HAND  Final   Special Requests   Final    BOTTLES DRAWN AEROBIC AND ANAEROBIC Blood Culture adequate volume   Culture   Final    NO GROWTH 2 DAYS Performed at [facility], [address], [place], [place] [address]    Report Status PENDING  Incomplete  Surgical PCR screen     Status: Abnormal   Collection Time: 04/19/23 12:15 AM   Specimen: Nasal Mucosa; Nasal Swab  Result Value Ref Range Status   MRSA, PCR NEGATIVE NEGATIVE Final   Staphylococcus aureus POSITIVE (A) NEGATIVE Final    Comment: (NOTE) The Xpert SA Assay (FDA approved for NASAL specimens in patients 55 years of age and older), is one component of a comprehensive surveillance program. It is not intended to diagnose infection nor to guide or monitor treatment. Performed at [facility], [address] [place], [place] [address]   Aerobic/Anaerobic Culture w Gram Stain (surgical/deep wound)     Status: None (Preliminary result)   Collection Time: 04/19/23  5:33 PM   Specimen: Path Tissue  Result Value Ref Range Status   Specimen Description   Final    TISSUE Performed at [facility], [address], [place], [place] [address]    Special Requests   Final    NONE Performed at [facility], [address] [place], [place] [address]    Gram Stain   Final    NO WBC SEEN NO ORGANISMS SEEN Performed at [facility], [address], [place], [place] [address]    Culture PENDING  Incomplete   Report Status PENDING  Incomplete    Labs: CBC: Recent Labs  Lab 04/18/23 1521 04/19/23 0426 04/19/23 0844 04/20/23 0351  WBC 9.2 4.9 5.3 5.3  NEUTROABS 7.1  --   --   --   HGB 11.1* 9.4* 11.0* 9.6*  HCT 33.2* 28.7* 33.0* 29.1*  MCV 88.5 89.7 89.7 88.7  PLT 157 111* 146* 137*   Basic Metabolic  Panel: Recent Labs  Lab 04/18/23 1521 04/19/23 0844 04/20/23 0351  NA 135 138 138  K 4.4 4.4 4.0  CL 102 111 108  CO2 22 19* 22  GLUCOSE 129* 130* 118*  BUN 26* 15 12  CREATININE 2.24* 1.34* 1.19  CALCIUM 8.2* 7.9* 8.0*  MG  --  1.7 1.5*  PHOS  --  2.7 3.2   Liver Function Tests: Recent Labs  Lab 04/18/23 1521  AST 22  ALT 18  ALKPHOS 59  BILITOT 1.2  PROT 7.3  ALBUMIN 3.5   CBG: Recent Labs  Lab 04/19/23 1650 04/19/23 1805 04/19/23 2055 04/20/23 0745 04/20/23 1119  GLUCAP 71 86 180* 121* 261*    Discharge time spent: greater than 30 minutes.  This record has been created using Conservation officer, historic buildings. Errors have been sought and corrected,but may not always be located. Such creation errors do not reflect on the standard of care.   Signed: Arnetha Courser, MD [facility] 04/20/2023

## 2023-04-20 NOTE — Progress Notes (Signed)
AVS discussed and provided to patient. IV removed. No complaints at this time.  

## 2023-04-20 NOTE — TOC Progression Note (Signed)
Transition of Care [facility] - Progression Note    Patient Details  Name: Leavy Fulford MRN: [id number] Date of Birth: Sep 29, 1946  Transition of Care [facility]) CM/SW Contact  Marlowe Sax, RN Phone Number: 04/20/2023, 9:26 AM  Clinical Narrative:     Patient had !&D done last evening Cultures and biopsy sent TOC to follow and assist with DC planning and needs Remains on IV ABX  Expected Discharge Plan: Home/Self Care Barriers to Discharge: Continued Medical Work up  Expected Discharge Plan and Services       Living arrangements for the past 2 months: Single Family Home                                       Social Determinants of Health (SDOH) Interventions SDOH Screenings   Food Insecurity: No Food Insecurity (04/18/2023)  Housing: Low Risk  (04/18/2023)  Transportation Needs: No Transportation Needs (04/18/2023)  Utilities: Not At Risk (04/18/2023)  Tobacco Use: Medium Risk (04/19/2023)    Readmission Risk Interventions     No data to display

## 2023-04-20 NOTE — Consult Note (Signed)
Pharmacy Antibiotic Note  Kenneth Duarte is a 77 y.o. male admitted on 04/18/2023 with  wound infection/osteomyelitis .  Pharmacy has been consulted for Daptomycin dosing. Patient with cultures form 03/21/23 from right foot with MSSA and amp-susc E faecalis.  Patient on doxycyline and amoxicillin/clavulanate in the last month. ID has been consulted.  Today, 04/20/2023 Day #2 antibiotics - daptomycin + ampicillin/sulbactam Renal: SCr improving, 1.19 Afebrile WBC WNL 5/2 OR - podiatry for I&D, removal remaining 5th metatarsal Culture pending CK 5/2 (baseline) = 70 Note on atorvastatin 80mg   Plan: Continue Daptomycin  700mg  (8mg /kg) IV q24h for osteomyelitis Check CK 5/2 (baseline =70) then at least weekly if remains on daptomycin  Note on atorvastatin 80mg  Continue ampicillin/sulbactam 3gm IV q6h Follow renal function Follow cultures from I&D 5/2 by podiatry   Height: 5\' 11"  (180.3 cm) Weight: 86.2 kg (190 lb 0.6 oz) IBW/kg (Calculated) : 75.3  Temp (24hrs), Avg:98.9 F (37.2 C), Min:97.5 F (36.4 C), Max:99.8 F (37.7 C)  Recent Labs  Lab 04/18/23 1521 04/18/23 1819 04/19/23 0426 04/19/23 0844 04/20/23 0351  WBC 9.2  --  4.9 5.3 5.3  CREATININE 2.24*  --   --  1.34* 1.19  LATICACIDVEN 1.7 1.7  --   --   --      Estimated Creatinine Clearance: 58 mL/min (by C-G formula based on SCr of 1.19 mg/dL).    Allergies  Allergen Reactions   Blood-Group Specific Substance Other (See Comments)    Patient received almost 2 units FFP during a procedure. Became hypotensive and developed rash   Cortisone Acetate [Cortisone] Rash and Other (See Comments)    Antimicrobials this admission: 5/1 Ceftriaxone x 1 dose 5/1 Zosyn >> 5/2 5/1 Vancomycin >>5/2 5/2 amp/sulb >> 5/2 daptomycin >>  Dose adjustments this admission:n/a  Microbiology results: 5/1 BCx: NG < 24hrs 5/2 MRSA PCR: Negative 5/2 tissue:    Thank you for allowing pharmacy to be a part of this patient's  care.  Juliette Alcide, PharmD, BCPS, BCIDP Work Cell: [phone] 04/20/2023 8:43 AM

## 2023-04-20 NOTE — Progress Notes (Signed)
1 Day Post-Op   Subjective/Chief Complaint: Patient seen.  Some pain last night but doing fairly well at this point.   Objective: Vital signs in last 24 hours: Temp:  [97.5 F (36.4 C)-99.8 F (37.7 C)] 99 F (37.2 C) (05/03 0744) Pulse Rate:  [69-81] 69 (05/03 0744) Resp:  [15-19] 19 (05/03 0744) BP: (125-165)/(40-72) 144/61 (05/03 0744) SpO2:  [93 %-100 %] 97 % (05/03 0744)    Intake/Output from previous day: 05/02 0701 - 05/03 0700 In: 200 [I.V.:200] Out: 1750 [Urine:1750] Intake/Output this shift: No intake/output data recorded.  Dressing on right foot is dry and intact.  Upon removal there is some moderate bleeding.  No signs of any purulence or drainage.  Incision is well coapted with minimal erythema or edema.  Open portion of the wound appears granular.  Lab Results:  Recent Labs    04/19/23 0844 04/20/23 0351  WBC 5.3 5.3  HGB 11.0* 9.6*  HCT 33.0* 29.1*  PLT 146* 137*   BMET Recent Labs    04/19/23 0844 04/20/23 0351  NA 138 138  K 4.4 4.0  CL 111 108  CO2 19* 22  GLUCOSE 130* 118*  BUN 15 12  CREATININE 1.34* 1.19  CALCIUM 7.9* 8.0*   PT/INR Recent Labs    04/18/23 1521 04/19/23 0426  LABPROT 14.9 16.1*  INR 1.2 1.3*   ABG No results for input(s): "PHART", "HCO3" in the last 72 hours.  Invalid input(s): "PCO2", "PO2"  Studies/Results: MR FOOT RIGHT W WO CONTRAST  Result Date: 04/19/2023 CLINICAL DATA:  Foot pain, swelling and open wound. History of amputation 3 weeks ago. EXAM: MRI OF THE RIGHT FOREFOOT WITHOUT AND WITH CONTRAST TECHNIQUE: Multiplanar, multisequence MR imaging of the right foot was performed before and after the administration of intravenous contrast. CONTRAST:  8mL GADAVIST GADOBUTROL 1 MMOL/ML IV SOLN COMPARISON:  MRI 03/20/2023 FINDINGS: Surgical changes related to recent amputation of the fifth toe and partial amputation of the fifth metatarsal. There are 2 small open wounds noted on the lateral aspect of the forefoot  with significant surrounding changes of cellulitis and enhancing granulation tissue. No discrete abscess is identified. There is diffuse abnormal T2 signal intensity and enhancement in the remaining fifth metatarsal suggesting osteomyelitis. The other bony structures are intact. There is diffuse cellulitis and moderate myositis without findings for pyomyositis. IMPRESSION: 1. Surgical changes related to recent amputation of the fifth toe and partial amputation of the fifth metatarsal. 2. 2 small open wounds on the lateral aspect of the forefoot with significant surrounding changes of cellulitis and enhancing granulation tissue. No discrete abscess. 3. Diffuse abnormal T2 signal intensity and enhancement in the remaining fifth metatarsal suggesting osteomyelitis. 4. Diffuse cellulitis and moderate myositis without findings for pyomyositis. Electronically Signed   By: Rudie Meyer M.D.   On: 04/19/2023 07:59    Anti-infectives: Anti-infectives (From admission, onward)    Start     Dose/Rate Route Frequency Ordered Stop   04/20/23 0500  vancomycin (VANCOREADY) IVPB 1500 mg/300 mL  Status:  Discontinued        1,500 mg 150 mL/hr over 120 Minutes Intravenous Every 36 hours 04/19/23 0942 04/19/23 1009   04/19/23 2200  DAPTOmycin (CUBICIN) 700 mg in sodium chloride 0.9 % IVPB        8 mg/kg  86.2 kg 128 mL/hr over 30 Minutes Intravenous Daily 04/19/23 1238     04/19/23 1746  vancomycin (VANCOCIN) powder  Status:  Discontinued  As needed 04/19/23 1747 04/19/23 1801   04/19/23 1600  vancomycin (VANCOREADY) IVPB 1500 mg/300 mL  Status:  Discontinued        1,500 mg 150 mL/hr over 120 Minutes Intravenous Every 24 hours 04/19/23 1009 04/19/23 1029   04/19/23 1200  Ampicillin-Sulbactam (UNASYN) 3 g in sodium chloride 0.9 % 100 mL IVPB        3 g 200 mL/hr over 30 Minutes Intravenous Every 6 hours 04/19/23 1029     04/18/23 2200  piperacillin-tazobactam (ZOSYN) IVPB 3.375 g  Status:  Discontinued         3.375 g 12.5 mL/hr over 240 Minutes Intravenous Every 8 hours 04/18/23 2100 04/19/23 1029   04/18/23 1630  vancomycin (VANCOREADY) IVPB 1750 mg/350 mL        1,750 mg 175 mL/hr over 120 Minutes Intravenous  Once 04/18/23 1618 04/18/23 2017   04/18/23 1615  vancomycin (VANCOCIN) IVPB 1000 mg/200 mL premix  Status:  Discontinued        1,000 mg 200 mL/hr over 60 Minutes Intravenous  Once 04/18/23 1608 04/18/23 1618   04/18/23 1615  cefTRIAXone (ROCEPHIN) 2 g in sodium chloride 0.9 % 100 mL IVPB        2 g 200 mL/hr over 30 Minutes Intravenous  Once 04/18/23 1608 04/18/23 1729       Assessment/Plan: s/p Procedure(s): INCISION AND DRAINAGE (Right) Assessment: Stable status post I&D right foot.  Plan: Betadine gauze applied to the incision area followed by a bulky sterile bandage with Ace wrap.  Patient instructed only to weight-bear on the heel of his right foot using crutches.  With removal of the entire fifth metatarsal patient should be stable for discharge on oral antibiotics.  Spoke with infectious disease about this.  Patient will have his sister change his dressing on Sunday and follow-up with me on Tuesday or Wednesday next week.  Patient stable for discharge from podiatry standpoint today.  LOS: 1 day    Ricci Barker 04/20/2023

## 2023-04-20 NOTE — Plan of Care (Signed)

## 2023-04-20 NOTE — TOC Benefit Eligibility Note (Signed)
Patient Product/process development scientist completed.    The patient is currently admitted and upon discharge could be taking linezolid (Zyvox) 600 mg tablets.  The current 10 day co-pay is $48.82.   The patient is insured through [employer] Part D   This test claim was processed through [facility]- copay amounts may vary at other pharmacies due to pharmacy/plan contracts, or as the patient moves through the different stages of their insurance plan.  Roland Earl, CPHT Pharmacy Patient Advocate Specialist [facility] Patient Advocate Team Direct Number: [phone]  Fax: [phone]

## 2023-04-20 NOTE — Progress Notes (Signed)
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 Platelets is 137 and should be OK for short course, no DDI with linezolid  Needs close follow up with Podiatry to monitor for recurrence as well as persistent infection. Check CBC in 10-14 days to monitor thrombocytopenia  Fu OR cx to completion  D/w primary and podiatry    Results for orders placed or performed during the hospital encounter of 04/18/23  Culture, blood (Routine x 2)     Status: None (Preliminary result)   Collection Time: 04/18/23  3:21 PM   Specimen: BLOOD  Result Value Ref Range Status   Specimen Description BLOOD LEFT ANTECUBITAL  Final   Special Requests   Final    BOTTLES DRAWN AEROBIC AND ANAEROBIC Blood Culture results may not be optimal due to an excessive volume of blood received in culture bottles   Culture   Final    NO GROWTH 2 DAYS Performed at [facility], [address] [place], [place] [address]    Report Status PENDING  Incomplete  Culture, blood (Routine x 2)     Status: None (Preliminary result)   Collection Time: 04/18/23 11:18 PM   Specimen: BLOOD RIGHT HAND  Result Value Ref Range Status   Specimen Description BLOOD RIGHT HAND  Final   Special Requests   Final    BOTTLES DRAWN AEROBIC AND ANAEROBIC Blood Culture adequate volume   Culture   Final    NO GROWTH 2 DAYS Performed at [facility], [address], [place], [place] [address]    Report Status PENDING  Incomplete  Surgical PCR screen     Status: Abnormal   Collection Time: 04/19/23 12:15 AM   Specimen: Nasal Mucosa; Nasal Swab  Result Value Ref Range Status   MRSA, PCR NEGATIVE NEGATIVE Final   Staphylococcus aureus POSITIVE (A) NEGATIVE Final    Comment: (NOTE) The Xpert SA Assay (FDA approved for NASAL specimens in patients 56 years of age and older), is one component of a  comprehensive surveillance program. It is not intended to diagnose infection nor to guide or monitor treatment. Performed at [facility] [address] [place], [place] [address]   Aerobic/Anaerobic Culture w Gram Stain (surgical/deep wound)     Status: None (Preliminary result)   Collection Time: 04/19/23  5:33 PM   Specimen: Path Tissue  Result Value Ref Range Status   Specimen Description   Final    TISSUE Performed at [facility], [address], [place], [place] [address]    Special Requests   Final    NONE Performed at [facility], [address] [place], [place] [address]    Gram Stain   Final    NO WBC SEEN NO ORGANISMS SEEN Performed at [facility], [address] [place], [place] [address]    Culture PENDING  Incomplete   Report Status PENDING  Incomplete

## 2023-04-20 NOTE — Progress Notes (Signed)
OT Screen Note  Patient Details Name: Kenneth Duarte MRN: [id number] DOB: Jan 17, 1946   Cancelled Treatment:    Reason Eval/Treat Not Completed: OT screened, no needs identified, will sign off. Consult received, chart reviewed. Pt received getting dressed independently with spouse present and RN going over discharge paperwork. Pt required minimal assist to don the post-op shoe (spouse able to assist as needed at home). Pt completed transfers independently during dressing and demonstrates good adherence to heel-only weight bearing precautions to R foot with post-op shoe on using AD for mobility. Pt/spouse deny therapy needs at this time and verbalize confidence in return home. No skilled OT services warranted at this time. Please re-consult should additional needs arise. MD notified.   Arman Filter., MPH, MS, OTR/L ascom [phone] 04/20/23, 12:19 PM

## 2023-04-20 NOTE — Progress Notes (Signed)
Pharmacy - Antimicrobial [facility]  Per ID patient to go home on linezolid and amoxicillin/clavulanate to complete totat 14 days of antibiotics.    Copay $48.82 for linezolid  Reviewed copay, instructions for taking and possible side effects with patient.   Juliette Alcide, PharmD, BCPS, BCIDP Work Cell: [phone] 04/20/2023 1:08 PM

## 2023-04-20 NOTE — TOC Progression Note (Signed)
Transition of Care [facility]) - Progression Note    Patient Details  Name: Kenneth Duarte MRN: [id number] Date of Birth: [birth year]  Transition of [facility]) CM/SW Contact  Marlowe Sax, RN Phone Number: 04/20/2023, 11:42 AM  Clinical Narrative:    Patient from home, will return home Will be weight bearing on right heel and has been taught how to safely do so He has been instructed about dressing change by podiatry and will change own dressing He will follow up with podiatry for further needs NO TOC needs identified   Expected Discharge Plan: Home/Self Care Barriers to Discharge: Continued Medical Work up  Expected Discharge Plan and Services       Living arrangements for the past 2 months: Single Family Home Expected Discharge Date: 04/20/23                                     Social Determinants of Health (SDOH) Interventions SDOH Screenings   Food Insecurity: No Food Insecurity (04/18/2023)  Housing: Low Risk  (04/18/2023)  Transportation Needs: No Transportation Needs (04/18/2023)  Utilities: Not At Risk (04/18/2023)  Tobacco Use: Medium Risk (04/19/2023)    Readmission Risk Interventions     No data to display

## 2023-04-21 LAB — CULTURE, BLOOD (ROUTINE X 2): Special Requests: ADEQUATE

## 2023-04-21 LAB — AEROBIC/ANAEROBIC CULTURE W GRAM STAIN (SURGICAL/DEEP WOUND): Gram Stain: NONE SEEN

## 2023-04-23 LAB — SURGICAL PATHOLOGY

## 2023-04-23 LAB — CULTURE, BLOOD (ROUTINE X 2)

## 2023-04-23 LAB — AEROBIC/ANAEROBIC CULTURE W GRAM STAIN (SURGICAL/DEEP WOUND)

## 2023-04-24 ENCOUNTER — Encounter: Payer: Self-pay | Admitting: Podiatry

## 2023-04-24 LAB — AEROBIC/ANAEROBIC CULTURE W GRAM STAIN (SURGICAL/DEEP WOUND)

## 2023-05-11 ENCOUNTER — Ambulatory Visit: Payer: Medicare HMO | Attending: Physician Assistant | Admitting: Physician Assistant

## 2023-05-11 ENCOUNTER — Encounter: Payer: Self-pay | Admitting: Physician Assistant

## 2023-05-11 VITALS — BP 136/60 | HR 65 | Ht 71.0 in | Wt 192.8 lb

## 2023-05-11 DIAGNOSIS — I739 Peripheral vascular disease, unspecified: Secondary | ICD-10-CM

## 2023-05-11 DIAGNOSIS — R9431 Abnormal electrocardiogram [ECG] [EKG]: Secondary | ICD-10-CM

## 2023-05-11 DIAGNOSIS — M869 Osteomyelitis, unspecified: Secondary | ICD-10-CM

## 2023-05-11 DIAGNOSIS — E785 Hyperlipidemia, unspecified: Secondary | ICD-10-CM

## 2023-05-11 DIAGNOSIS — Z951 Presence of aortocoronary bypass graft: Secondary | ICD-10-CM | POA: Diagnosis not present

## 2023-05-11 DIAGNOSIS — I25810 Atherosclerosis of coronary artery bypass graft(s) without angina pectoris: Secondary | ICD-10-CM | POA: Diagnosis not present

## 2023-05-11 DIAGNOSIS — I10 Essential (primary) hypertension: Secondary | ICD-10-CM

## 2023-05-11 MED ORDER — NITROGLYCERIN 0.4 MG SL SUBL: 0.4000 mg | SUBLINGUAL_TABLET | SUBLINGUAL | 3 refills | Status: AC | PRN

## 2023-05-11 NOTE — Patient Instructions (Signed)
Medication Instructions:  No changes at this time.   *If you need a refill on your cardiac medications before your next appointment, please call your pharmacy*   Lab Work: None  If you have labs (blood work) drawn today and your tests are completely normal, you will receive your results only by: MyChart Message (if you have MyChart) OR A paper copy in the mail If you have any lab test that is abnormal or we need to change your treatment, we will call you to review the results.   Testing/Procedures: Your physician has requested that you have an echocardiogram. Echocardiography is a painless test that uses sound waves to create images of your heart. It provides your doctor with information about the size and shape of your heart and how well your heart's chambers and valves are working. This procedure takes approximately one hour. There are no restrictions for this procedure. Please do NOT wear cologne, perfume, aftershave, or lotions (deodorant is allowed). Please arrive 15 minutes prior to your appointment time.    Follow-Up: At [facility], you and your health needs are our priority.  As part of our continuing mission to provide you with exceptional heart care, we have created designated Provider Care Teams.  These Care Teams include your primary Cardiologist (physician) and Advanced Practice Providers (APPs -  Physician Assistants and Nurse Practitioners) who all work together to provide you with the care you need, when you need it.  We recommend signing up for the patient portal called "MyChart".  Sign up information is provided on this After Visit Summary.  MyChart is used to connect with patients for Virtual Visits (Telemedicine).  Patients are able to view lab/test results, encounter notes, upcoming appointments, etc.  Non-urgent messages can be sent to your provider as well.   To learn more about what you can do with MyChart, go to [email or web address].    Your next  appointment:   6 month(s)  Provider:   Julien Nordmann, MD or Eula Listen, PA-C

## 2023-05-11 NOTE — Progress Notes (Signed)
Cardiology Office Note    Date:  05/11/2023   ID:  Kenneth Duarte, DOB August 20, 1946, MRN [id number]  PCP:  Kenneth Morgan, MD  Cardiologist:  Julien Nordmann, MD  Electrophysiologist:  None   Chief Complaint: Follow-up  History of Present Illness:   Kenneth Duarte is a 77 y.o. 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  He was previously followed by [facility] cardiology.  LHC in 02/2017 showed multivessel CAD with the patient ultimately undergoing three-vessel bypass in 03/2017 as outlined above.  Intraoperative TEE showed an EF of 50 to 55%.  He underwent repeat cath in 06/2017 which showed severe native vessel CAD with patent LIMA to LAD with discrete 75% stenosis near the anastomotic site, occluded SVG to PDA, and patent SVG to OM1 with a long diffuse 95% stenosis in the proximal segment of the graft with near 60 to 70% stenosis at the anastomotic site.  There was 25% stenosis in the native vessel distal to the anastomotic site with retrograde filling of the LCx.  He was transferred to [facility] where he underwent repeat LHC with PCI/DES to the SVG to OM.  There was distal graft 75% stenosis with more disease noted past the graft insertion in the OM and distal LCx.  He was last seen in the office in 04/2022 and was without symptoms of angina or cardiac decompensation.  More recently, he has been admitted to the hospital 03/2023 and 04/2023 with  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  He was readmitted with nonhealing wound and underwent remaining fifth metatarsal bone amputation in 04/2023.  He comes in doing well from a cardiac perspective and is without symptoms of angina or cardiac decompensation.  No palpitations, dizziness, presyncope, or syncope.  No significant lower extremity swelling, abdominal distention, progressive orthopnea, or early satiety.  No falls, hematochezia, or melena.   Labs independently reviewed: 04/2023 - Hgb 9.6, PLT 137, potassium 4.0, BUN 12, serum creatinine 1.19, magnesium 1.5, albumin 3.5, AST/ALT normal 03/2023 - A1c 7.0 06/2018 - TSH normal 06/2022 - TC 70, TG 86, HDL 32, LDL 28   Past Medical History:  Diagnosis Date   Arthritis    Chronic pain syndrome    Congestive heart failure (HCC) 1980   Coronary artery disease    a. s/p 3V CABG (03/2017). b. NSTEMI 06/2017 with progressive graft disease, s/p DES to SVG-OM.   Diabetes (HCC)    DIET   Dyspnea    with exertion   ED (erectile dysfunction)    GERD (gastroesophageal reflux disease)    Gout    Headache    Hyperlipemia    Hypertension    CONTROLLED ON MEDS   IDA (iron deficiency anemia)  10/21/2019   Neuropathy of both feet    Seborrheic keratosis    Sinus congestion    Vertigo    Wears dentures    PARTIAL UPPER    Past Surgical History:  Procedure Laterality Date   COLONOSCOPY WITH PROPOFOL N/A 10/25/2015   Procedure: COLONOSCOPY WITH PROPOFOL;  Surgeon: Midge Minium, MD;  Location: [facility];  Service: Endoscopy;  Laterality: N/A;  DIABETIC-ORAL MEDS   COLONOSCOPY WITH PROPOFOL N/A 10/22/2020   Procedure: COLONOSCOPY CANCELED;  Surgeon: Midge Minium, MD;  Location: [facility];  Service:  Endoscopy;  Laterality: N/A;  procedure aborted poor prep   COLONOSCOPY WITH PROPOFOL N/A 10/25/2020   Procedure: COLONOSCOPY WITH PROPOFOL;  Surgeon: Midge Minium, MD;  Location: [facility];  Service: Endoscopy;  Laterality: N/A;   CORONARY ARTERY BYPASS GRAFT N/A 04/10/2017   Procedure: CORONARY ARTERY BYPASS GRAFTING (CABG) x three , using left internal mammary artery and right leg greater saphenous vein harvested endoscopically;  Surgeon: Kenneth Perna, MD;  Location: [facility] OR;  Service: Open Heart Surgery;  Laterality: N/A;   CORONARY ARTERY BYPASS GRAFT     CORONARY STENT INTERVENTION N/A 06/19/2017   Procedure: Coronary Stent Intervention;  Surgeon: Kenneth Crafts, MD;  Location: [facility] INVASIVE CV LAB;  Service: Cardiovascular;  Laterality: N/A;   ESOPHAGOGASTRODUODENOSCOPY (EGD) WITH PROPOFOL N/A 10/22/2020   Procedure: ESOPHAGOGASTRODUODENOSCOPY (EGD) WITH PROPOFOL;  Surgeon: Midge Minium, MD;  Location: [facility];  Service: Endoscopy;  Laterality: N/A;  Diabetic - oral meds   EXCISION PARTIAL PHALANX Right 03/22/2023   Procedure: EXCISION  PHALANX;  Surgeon: Kenneth Duarte, DPM;  Location: [facility] ORS;  Service: Podiatry;  Laterality: Right;   INCISION AND DRAINAGE Right 04/19/2023   Procedure: INCISION AND DRAINAGE;  Surgeon: Kenneth Duarte, DPM;  Location: [facility] OR[facility];  Service: Podiatry;  Laterality: Right;   IRRIGATION AND DEBRIDEMENT FOOT Right 03/22/2023   Procedure: IRRIGATION AND DEBRIDEMENT FOOT;  Surgeon: Kenneth Duarte, DPM;  Location: [facility] ORS;  Service: Podiatry;  Laterality: Right;   KNEE SURGERY Right    over 20 years ago   LEFT HEART CATH AND CORONARY ANGIOGRAPHY Left 02/22/2017   Procedure: Left Heart Cath and Coronary Angiography;  Surgeon: Kenneth Blinks, MD;  Location: [facility] INVASIVE CV LAB;  Service: Cardiovascular;  Laterality: Left;   LEFT HEART CATH AND CORS/GRAFTS ANGIOGRAPHY N/A 06/18/2017   Procedure: Left Heart Cath and Cors/Grafts Angiography and PCI;  Surgeon:  Kenneth Millard, MD;  Location: [facility] INVASIVE CV LAB;  Service: Cardiovascular;  Laterality: N/A;   LOWER EXTREMITY ANGIOGRAPHY Right 03/23/2023   Procedure: Lower Extremity Angiography;  Surgeon: Annice Needy, MD;  Location: [facility] INVASIVE CV LAB;  Service: Cardiovascular;  Laterality: Right;   LOWER EXTREMITY INTERVENTION  03/23/2023   Procedure: LOWER EXTREMITY INTERVENTION;  Surgeon: Annice Needy, MD;  Location: [facility] INVASIVE CV LAB;  Service: Cardiovascular;;   POLYPECTOMY  10/25/2015   Procedure: POLYPECTOMY;  Surgeon: Midge Minium, MD;  Location: [facility];  Service: Endoscopy;;   ROTATOR CUFF REPAIR Right 2012   TEE WITHOUT CARDIOVERSION N/A 04/10/2017   Procedure: TRANSESOPHAGEAL ECHOCARDIOGRAM (TEE);  Surgeon: Kenneth Perna, MD;  Location: [facility] OR;  Service: Open Heart Surgery;  Laterality: N/A;    Current Medications: Current Meds  Medication Sig   ACCU-CHEK AVIVA PLUS test strip    Accu-Chek Softclix Lancets lancets    acetaminophen (TYLENOL) 500 MG tablet Take 2 tablets (1,000 mg total) by mouth every 6 (six) hours as needed.  Alcohol Swabs (B-D SINGLE USE SWABS REGULAR) PADS    amlodipine-benazepril (LOTREL) 2.5-10 MG capsule Take 1 capsule by mouth daily.   aspirin 81 MG tablet Take 1 tablet (81 mg total) by mouth daily.   atorvastatin (LIPITOR) 80 MG tablet TAKE 1 TABLET AT BEDTIME   carvedilol (COREG) 12.5 MG tablet TAKE 1 TABLET TWICE DAILY WITH MEALS   clopidogrel (PLAVIX) 75 MG tablet TAKE 1 TABLET EVERY DAY   docusate sodium (COLACE) 100 MG capsule Take 100 mg by mouth 2 (two) times daily as needed.    FEROSUL 325 (65 Fe) MG tablet TAKE 1 TABLET EVERY DAY   furosemide (LASIX) 20 MG tablet TAKE 1 TABLET EVERY DAY   gabapentin (NEURONTIN) 800 MG tablet Take 800 mg by mouth daily.   meclizine (ANTIVERT) 25 MG tablet TAKE 1 TABLET TWICE DAILY AS NEEDED FOR DIZZINESS   metFORMIN (GLUCOPHAGE-XR) 500 MG 24 hr tablet Take 500 mg by mouth 2 (two) times daily.     NARCAN 4 MG/0.1ML LIQD nasal spray kit 1 spray once.   oxyCODONE-acetaminophen (PERCOCET) 10-325 MG tablet Take 2 tablets by mouth every 6 (six) hours as needed for pain.   pantoprazole (PROTONIX) 40 MG tablet TAKE 1 TABLET EVERY DAY   vitamin B-12 (CYANOCOBALAMIN) 1000 MCG tablet Take 1,000 mcg by mouth daily.   VITAMIN D-1000 MAX ST 25 MCG (1000 UT) tablet Take 1,000 Units by mouth daily.   [DISCONTINUED] nitroGLYCERIN (NITROSTAT) 0.4 MG SL tablet Place 1 tablet (0.4 mg total) under the tongue every 5 (five) minutes as needed for chest pain.    Allergies:   Blood-group specific substance and Cortisone acetate [cortisone]   Social History   Socioeconomic History   Marital status: Married    Spouse name: Not on file   Number of children: Not on file   Years of education: Not on file   Highest education level: Not on file  Occupational History   Not on file  Tobacco Use   Smoking status: Former    Packs/day: 2.00    Years: 15.00    Additional pack years: 0.00    Total pack years: 30.00    Types: Cigarettes    Quit date: 06/30/1979    Years since quitting: 43.8   Smokeless tobacco: Never  Vaping Use   Vaping Use: Never used  Substance and Sexual Activity   Alcohol use: No    Alcohol/week: 0.0 standard drinks of alcohol    Comment: QUIT IN 1980   Drug use: No   Sexual activity: Yes    Partners: Male    Birth control/protection: Other-see comments  Other Topics Concern   Not on file  Social History Narrative   Not on file   Social Determinants of Health   Financial Resource Strain: Not on file  Food Insecurity: No Food Insecurity (04/18/2023)   Hunger Vital Sign    Worried About Running Out of Food in the Last Year: Never true    Ran Out of Food in the Last Year: Never true  Transportation Needs: No Transportation Needs (04/18/2023)   PRAPARE - Administrator, Civil Service (Medical): No    Lack of Transportation (Non-Medical): No  Physical Activity: Not on  file  Stress: Not on file  Social Connections: Not on file     Family History:  The patient's family history includes Cancer in his father and mother; Diabetes in his brother and sister; Heart attack in his father; Heart disease in his  brother and father; Prostate cancer in his brother. There is no history of Bladder Cancer or Kidney disease.  ROS:   12-point review of systems is negative unless otherwise noted in the HPI.   EKGs/Labs/Other Studies Reviewed:    Studies reviewed were summarized above. The additional studies were reviewed today:  LHC 06/19/2017: Origin to Prox Graft lesion, 99 %stenosed, in the SVG to OM. A STENT PROMUS PREM MR 4.0X38 drug eluting stent was successfully placed. IC Adenosine given prior to angioplasty. No distal protection used since it is a recently placed graft. Post intervention, there is a 0% residual stenosis. Dist Graft lesion, 75 %stenosed. There is more disease noted past the graft insertion in the OM and in the distal circumflex.   Continue dual antiplatelet therapy along with aggressive secondary prevention. __________  LHC 06/18/2017: Ost Cx to Prox Cx lesion, 100 %stenosed. Ost LM lesion, 50 %stenosed. Ost LAD lesion, 30 %stenosed. Mid RCA lesion, 95 %stenosed. SVG. Origin lesion, 100 %stenosed. SVG. Origin to Prox Graft lesion, 99 %stenosed. Dist Graft to Insertion lesion, 60 %stenosed. 1st Mrg lesion, 99 %stenosed. LIMA. Dist Graft lesion, 75 %stenosed.   1. Severe three-vessel coronary artery disease with heavily calcified, eccentric 50-60% stenosis distal left main, occluded proximal left circumflex, subtotal mid RCA. 2. Patent LIMA to mid LAD with discrete 75% stenosis near anastomotic site 3. Occluded SVG to PDA 4. Patent SVG to OM1 with a long diffuse 95% stenosis proximal segment of graft, with near 60-70% stenosis at the anastomotic site, 95% stenosis in the native vessel distal to the anastomotic site, with retrograde filling  of left circumflex 5. Mildly reduced left ventricular function, with inferior-posterior wall akinesis   Recommendations   1. Transfer to [facility] for high risk, technically challenging PCI versus redo CABG __________  Intraoperative TEE 04/10/2024:  Left ventricle: Normal cavity size, wall thickness, left ventricular diastolic function and left atrial pressure. LV systolic function is low normal with an EF of 50-55%. There are no obvious wall motion abnormalities. No thrombus present. No mass present.  Mitral valve: Mild regurgitation.  Right ventricle: Normal cavity size, wall thickness and ejection fraction.  Tricuspid valve: Mild regurgitation.     EKG:  EKG is ordered today.  The EKG ordered today demonstrates NSR, 65 bpm, lateral T wave inversion  Recent Labs: 04/18/2023: ALT 18 04/20/2023: BUN 12; Creatinine, Ser 1.19; Hemoglobin 9.6; Magnesium 1.5; Platelets 137; Potassium 4.0; Sodium 138  Recent Lipid Panel    Component Value Date/Time   CHOL 91 06/18/2017 2337   TRIG 198 (H) 06/18/2017 2337   HDL 22 (L) 06/18/2017 2337   CHOLHDL 4.1 06/18/2017 2337   VLDL 40 06/18/2017 2337   LDLCALC 29 06/18/2017 2337    PHYSICAL EXAM:    VS:  BP 136/60 (BP Location: Left Arm, Patient Position: Sitting, Cuff Size: Normal)   Pulse 65   Ht 5\' 11"  (1.803 m)   Wt 192 lb 12.8 oz (87.5 kg)   SpO2 99%   BMI 26.89 kg/m   BMI: Body mass index is 26.89 kg/m.  Physical Exam Vitals reviewed.  Constitutional:      Appearance: He is well-developed.  HENT:     Head: Normocephalic and atraumatic.  Eyes:     General:        Right eye: No discharge.        Left eye: No discharge.  Neck:     Vascular: No JVD.  Cardiovascular:     Rate and Rhythm: Normal  rate and regular rhythm.     Heart sounds: Normal heart sounds, S1 normal and S2 normal. Heart sounds not distant. No midsystolic click and no opening snap. No murmur heard.    No friction rub.  Pulmonary:     Effort: Pulmonary effort  is normal. No respiratory distress.     Breath sounds: Normal breath sounds. No decreased breath sounds, wheezing or rales.  Chest:     Chest wall: No tenderness.  Abdominal:     General: There is no distension.  Musculoskeletal:     Cervical back: Normal range of motion.     Comments: Right foot in surgical shoe.  Skin:    General: Skin is warm and dry.     Nails: There is no clubbing.  Neurological:     Mental Status: He is alert and oriented to person, place, and time.  Psychiatric:        Speech: Speech normal.        Behavior: Behavior normal.        Thought Content: Thought content normal.        Judgment: Judgment normal.     Wt Readings from Last 3 Encounters:  05/11/23 192 lb 12.8 oz (87.5 kg)  04/18/23 190 lb 0.6 oz (86.2 kg)  04/17/23 189 lb 12.8 oz (86.1 kg)     ASSESSMENT & PLAN:   CAD status post CABG status post PCI without angina with abnormal EKG: He is doing well and without symptoms of angina or cardiac decompensation.  With new lateral ST-T changes we will obtain echo to evaluate for new structural abnormalities.  Continue aggressive risk factor modification and current medical therapy including aspirin, clopidogrel, carvedilol, atorvastatin, and as needed SL NTG.  HTN: Blood pressure is reasonably controlled in the office today.  He remains on amlodipine/benazepril and carvedilol.  HDL: LDL 28 in 06/2022.  He remains on atorvastatin 80 mg.  Followed by PCP.  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  Remains on DAPT and atorvastatin.  Followed by vascular surgery and podiatry.   Disposition: F/u with Dr. Mariah Milling or an APP in 6 months.   Medication Adjustments/Labs and Tests Ordered: Current medicines are reviewed at length with the patient today.  Concerns regarding medicines are outlined above. Medication changes, Labs and Tests  ordered today are summarized above and listed in the Patient Instructions accessible in Encounters.   Signed, Eula Listen, PA-C 05/11/2023 10:17 AM      [facility] [address] [place], [place] [address] [phone]

## 2023-05-15 ENCOUNTER — Other Ambulatory Visit: Payer: Self-pay | Admitting: Specialist

## 2023-05-15 DIAGNOSIS — R911 Solitary pulmonary nodule: Secondary | ICD-10-CM

## 2023-06-05 ENCOUNTER — Ambulatory Visit
Admission: RE | Admit: 2023-06-05 | Discharge: 2023-06-05 | Disposition: A | Discharge status: Home / Self Care | Payer: Medicare HMO | Source: Ambulatory Visit | Attending: Specialist | Admitting: Specialist

## 2023-06-05 DIAGNOSIS — R911 Solitary pulmonary nodule: Secondary | ICD-10-CM | POA: Insufficient documentation

## 2023-06-20 ENCOUNTER — Other Ambulatory Visit: Payer: Self-pay | Admitting: Cardiovascular Disease

## 2023-06-25 ENCOUNTER — Other Ambulatory Visit: Payer: Self-pay | Admitting: Cardiovascular Disease

## 2023-06-29 ENCOUNTER — Ambulatory Visit: Payer: Medicare HMO

## 2023-06-29 DIAGNOSIS — R9431 Abnormal electrocardiogram [ECG] [EKG]: Secondary | ICD-10-CM | POA: Diagnosis not present

## 2023-06-29 LAB — ECHOCARDIOGRAM COMPLETE
AR max vel: 3.17 cm2
AV Area VTI: 3.35 cm2
AV Area mean vel: 3.16 cm2
AV Mean grad: 3 mmHg
AV Peak grad: 5.6 mmHg
Ao pk vel: 1.18 m/s
Area-P 1/2: 3.47 cm2
Calc EF: 49.3 %
S' Lateral: 5.5 cm
Single Plane A2C EF: 47.2 %
Single Plane A4C EF: 49.5 %

## 2023-07-18 ENCOUNTER — Other Ambulatory Visit: Payer: Self-pay | Admitting: Cardiovascular Disease

## 2023-07-18 NOTE — Telephone Encounter (Signed)
Hi,   Please advise if ok to fill or defer to PCP. Thank you so much.

## 2023-08-09 ENCOUNTER — Other Ambulatory Visit (INDEPENDENT_AMBULATORY_CARE_PROVIDER_SITE_OTHER): Payer: Self-pay | Admitting: Nurse Practitioner

## 2023-08-09 DIAGNOSIS — Z9889 Other specified postprocedural states: Secondary | ICD-10-CM

## 2023-08-10 ENCOUNTER — Ambulatory Visit: Payer: Medicare HMO | Admitting: Physician Assistant

## 2023-08-14 ENCOUNTER — Encounter (INDEPENDENT_AMBULATORY_CARE_PROVIDER_SITE_OTHER): Payer: Medicare HMO

## 2023-08-14 ENCOUNTER — Ambulatory Visit (INDEPENDENT_AMBULATORY_CARE_PROVIDER_SITE_OTHER): Payer: Medicare HMO | Admitting: Vascular Surgery

## 2023-08-22 ENCOUNTER — Encounter: Payer: Self-pay | Admitting: Oncology

## 2023-08-22 NOTE — Progress Notes (Signed)
Cardiology Office Note    Date:  08/23/2023   ID:  Kenneth Duarte [place], DOB 1946-01-18, MRN [id number]  PCP:  Emogene Morgan, MD  Cardiologist:  Julien Nordmann, MD  Electrophysiologist:  None   Chief Complaint: Follow up  History of Present Illness:   Kenneth Duarte is a 77 y.o. 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   He was previously followed by [facility] cardiology.  LHC in 02/2017 showed multivessel CAD with the patient ultimately undergoing three-vessel bypass in 03/2017 as outlined above.  Intraoperative TEE showed an EF of 50 to 55%.  He underwent repeat cath in 06/2017 which showed severe native vessel CAD with patent LIMA to LAD with discrete 75% stenosis near the anastomotic site, occluded SVG to PDA, and patent SVG to OM1 with a long diffuse 95% stenosis in the proximal segment of the graft with near 60 to 70% stenosis at the anastomotic site.  There was 25% stenosis in the native vessel distal to the anastomotic site with retrograde filling of the LCx.  He was transferred to [facility] where he underwent repeat LHC with PCI/DES to the SVG to OM.  There was distal graft 75% stenosis with more disease noted past the graft insertion in the OM and distal LCx.     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  He was readmitted with nonhealing wound and underwent remaining fifth metatarsal bone amputation in 04/2023.  He was last seen in the office in 04/2023 and was without symptoms of angina or cardiac decompensation.  No cardiac changes were indicated at that time.  With new lateral ST-T changes on EKG, he underwent echo in 06/2023 that showed an EF of 35 to 40%, global hypokinesis, severely dilated LV internal cavity size, grade 1 diastolic dysfunction, normal RV systolic function and ventricular cavity size, mildly dilated left atrium, mild to moderate mitral regurgitation, and an estimated right atrial pressure of 15 mmHg.  He was seen at outside ER on 07/13/2023 after sustaining a mechanical fall slipping on a wet ramp leading to rib fractures in ribs 2 through 6.  X-ray was also remarkable for a small right-sided pneumothorax that persisted on repeat chest x-ray with concern for increased fluid component as well.  Patient declined hospital admission and left AMA.  He comes in doing well from a cardiac perspective and is without symptoms of angina or cardiac decompensation.  No dyspnea, palpitations, dizziness, presyncope, or syncope.  No further falls.  No hematochezia or melena.  No lower extremity swelling, abdominal distention, orthopnea, or early satiety.  Reports adherence to medications, has not yet taken his morning medications.  He does  not have any acute cardiac concerns at this time.   Labs independently reviewed: 04/2023 - Hgb 9.6, PLT 137, potassium 4.0, BUN 12, serum creatinine 1.19, magnesium 1.5, albumin 3.5, AST/ALT normal 03/2023 - A1c 7.0 06/2018 - TSH normal 06/2022 - TC 70, TG 86, HDL 32, LDL 28  Past Medical History:  Diagnosis Date   Arthritis    Chronic pain syndrome    Congestive heart failure (HCC) 1980   Coronary artery  disease    a. s/p 3V CABG (03/2017). b. NSTEMI 06/2017 with progressive graft disease, s/p DES to SVG-OM.   Diabetes (HCC)    DIET   Dyspnea    with exertion   ED (erectile dysfunction)    GERD (gastroesophageal reflux disease)    Gout    Headache    Hyperlipemia    Hypertension    CONTROLLED ON MEDS   IDA (iron deficiency anemia) 10/21/2019   Neuropathy of both feet    Seborrheic keratosis    Sinus congestion    Vertigo    Wears dentures    PARTIAL UPPER    Past Surgical History:  Procedure Laterality Date   COLONOSCOPY WITH PROPOFOL N/A 10/25/2015   Procedure: COLONOSCOPY WITH PROPOFOL;  Surgeon: Midge Minium, MD;  Location: [facility];  Service: Endoscopy;  Laterality: N/A;  DIABETIC-ORAL MEDS   COLONOSCOPY WITH PROPOFOL N/A 10/22/2020   Procedure: COLONOSCOPY CANCELED;  Surgeon: Midge Minium, MD;  Location: [facility] SURGERY [facility];  Service: Endoscopy;  Laterality: N/A;  procedure aborted poor prep   COLONOSCOPY WITH PROPOFOL N/A 10/25/2020   Procedure: COLONOSCOPY WITH PROPOFOL;  Surgeon: Midge Minium, MD;  Location: [facility] SURGERY [facility]TR;  Service: Endoscopy;  Laterality: N/A;   CORONARY ARTERY BYPASS GRAFT N/A 04/10/2017   Procedure: CORONARY ARTERY BYPASS GRAFTING (CABG) x three , using left internal mammary artery and right leg greater saphenous vein harvested endoscopically;  Surgeon: Kerin Perna, MD;  Location: [facility] OR;  Service: Open Heart Surgery;  Laterality: N/A;   CORONARY ARTERY BYPASS GRAFT     CORONARY STENT INTERVENTION N/A 06/19/2017   Procedure: Coronary Stent Intervention;  Surgeon: Corky Crafts, MD;  Location: [facility] INVASIVE CV LAB;  Service: Cardiovascular;  Laterality: N/A;   ESOPHAGOGASTRODUODENOSCOPY (EGD) WITH PROPOFOL N/A 10/22/2020   Procedure: ESOPHAGOGASTRODUODENOSCOPY (EGD) WITH PROPOFOL;  Surgeon: Midge Minium, MD;  Location: [facility] SURGERY CNTR;  Service: Endoscopy;  Laterality: N/A;  Diabetic - oral meds   EXCISION PARTIAL PHALANX Right  03/22/2023   Procedure: EXCISION  PHALANX;  Surgeon: Rosetta Posner, DPM;  Location: [facility] ORS;  Service: Podiatry;  Laterality: Right;   INCISION AND DRAINAGE Right 04/19/2023   Procedure: INCISION AND DRAINAGE;  Surgeon: Linus Galas, DPM;  Location: [facility] ORS;  Service: Podiatry;  Laterality: Right;   IRRIGATION AND DEBRIDEMENT FOOT Right 03/22/2023   Procedure: IRRIGATION AND DEBRIDEMENT FOOT;  Surgeon: Rosetta Posner, DPM;  Location: [facility] ORS;  Service: Podiatry;  Laterality: Right;   KNEE SURGERY Right    over 20 years ago   LEFT HEART CATH AND CORONARY ANGIOGRAPHY Left 02/22/2017   Procedure: Left Heart Cath and Coronary Angiography;  Surgeon: Lamar Blinks, MD;  Location: [facility] INVASIVE CV LAB;  Service: Cardiovascular;  Laterality: Left;   LEFT HEART CATH AND CORS/GRAFTS ANGIOGRAPHY N/A 06/18/2017   Procedure: Left Heart Cath and Cors/Grafts Angiography and PCI;  Surgeon: Marcina Millard, MD;  Location: [facility] INVASIVE CV LAB;  Service: Cardiovascular;  Laterality: N/A;   LOWER EXTREMITY ANGIOGRAPHY Right 03/23/2023   Procedure:  Lower Extremity Angiography;  Surgeon: Annice Needy, MD;  Location: [facility] INVASIVE CV LAB;  Service: Cardiovascular;  Laterality: Right;   LOWER EXTREMITY INTERVENTION  03/23/2023   Procedure: LOWER EXTREMITY INTERVENTION;  Surgeon: Annice Needy, MD;  Location: [facility] INVASIVE CV LAB;  Service: Cardiovascular;;   POLYPECTOMY  10/25/2015   Procedure: POLYPECTOMY;  Surgeon: Midge Minium, MD;  Location: [facility] SURGERY CNTR;  Service: Endoscopy;;   ROTATOR CUFF REPAIR Right 2012   TEE WITHOUT CARDIOVERSION N/A 04/10/2017   Procedure: TRANSESOPHAGEAL ECHOCARDIOGRAM (TEE);  Surgeon: Kerin Perna, MD;  Location: [facility] OR;  Service: Open Heart Surgery;  Laterality: N/A;    Current Medications: Current Meds  Medication Sig   ACCU-CHEK AVIVA PLUS test strip    Accu-Chek Softclix Lancets lancets    Alcohol Swabs (B-D SINGLE USE SWABS REGULAR) PADS    aspirin 81 MG tablet Take 1 tablet  (81 mg total) by mouth daily.   atorvastatin (LIPITOR) 80 MG tablet TAKE 1 TABLET AT BEDTIME   carvedilol (COREG) 12.5 MG tablet TAKE 1 TABLET TWICE DAILY WITH MEALS   clopidogrel (PLAVIX) 75 MG tablet TAKE 1 TABLET EVERY DAY   docusate sodium (COLACE) 100 MG capsule Take 100 mg by mouth 2 (two) times daily as needed.    FEROSUL 325 (65 Fe) MG tablet TAKE 1 TABLET EVERY DAY   furosemide (LASIX) 20 MG tablet TAKE 1 TABLET EVERY DAY   gabapentin (NEURONTIN) 800 MG tablet Take 800 mg by mouth daily.   meclizine (ANTIVERT) 25 MG tablet TAKE 1 TABLET TWICE DAILY AS NEEDED FOR DIZZINESS   metFORMIN (GLUCOPHAGE-XR) 500 MG 24 hr tablet Take 500 mg by mouth 2 (two) times daily.    nitroGLYCERIN (NITROSTAT) 0.4 MG SL tablet Place 1 tablet (0.4 mg total) under the tongue every 5 (five) minutes as needed for chest pain.   oxyCODONE-acetaminophen (PERCOCET) 10-325 MG tablet Take 2 tablets by mouth every 6 (six) hours as needed for pain.   pantoprazole (PROTONIX) 40 MG tablet TAKE 1 TABLET EVERY DAY   vitamin B-12 (CYANOCOBALAMIN) 1000 MCG tablet Take 1,000 mcg by mouth daily.   VITAMIN D-1000 MAX ST 25 MCG (1000 UT) tablet Take 1,000 Units by mouth daily.   [DISCONTINUED] amlodipine-benazepril (LOTREL) 2.5-10 MG capsule Take 1 capsule by mouth daily.   [DISCONTINUED] sacubitril-valsartan (ENTRESTO) 24-26 MG Take 1 tablet by mouth 2 (two) times daily.    Allergies:   Blood-group specific substance and Cortisone acetate [cortisone]   Social History   Socioeconomic History   Marital status: Married    Spouse name: Not on file   Number of children: Not on file   Years of education: Not on file   Highest education level: Not on file  Occupational History   Not on file  Tobacco Use   Smoking status: Former    Current packs/day: 0.00    Average packs/day: 2.0 packs/day for 15.0 years (30.0 ttl pk-yrs)    Types: Cigarettes    Start date: 06/29/1964    Quit date: 06/30/1979    Years since quitting:  44.1   Smokeless tobacco: Never  Vaping Use   Vaping status: Never Used  Substance and Sexual Activity   Alcohol use: No    Alcohol/week: 0.0 standard drinks of alcohol    Comment: QUIT IN 1980   Drug use: No   Sexual activity: Yes    Partners: Male    Birth control/protection: Other-see comments  Other Topics Concern   Not on file  Social History Narrative   Not on file   Social Determinants of Health   Financial Resource Strain: Not on file  Food Insecurity: No Food Insecurity (04/18/2023)   Hunger Vital Sign    Worried About Running Out of Food in the Last Year: Never true    Ran Out of Food in the Last Year: Never true  Transportation Needs: No Transportation Needs (04/18/2023)   PRAPARE - Administrator, Civil Service (Medical): No    Lack of Transportation (Non-Medical): No  Physical Activity: Not on file  Stress: Not on file  Social Connections: Not on file     Family History:  The patient's family history includes Cancer in his father and mother; Diabetes in his brother and sister; Heart attack in his father; Heart disease in his brother and father; Prostate cancer in his brother. There is no history of Bladder Cancer or Kidney disease.  ROS:   12-point review of systems is negative unless otherwise noted in the HPI.   EKGs/Labs/Other Studies Reviewed:    Studies reviewed were summarized above. The additional studies were reviewed today:  2D echo 06/29/2023: 1. Left ventricular ejection fraction, by estimation, is 35 to 40%. The  left ventricle has moderately decreased function. The left ventricle  demonstrates global hypokinesis. The left ventricular internal cavity size  was severely dilated. Left  ventricular diastolic parameters are consistent with Grade I diastolic  dysfunction (impaired relaxation).   2. Right ventricular systolic function is normal. The right ventricular  size is normal.   3. Left atrial size was mildly dilated.   4. The  mitral valve is normal in structure. Mild to moderate mitral valve  regurgitation.   5. The aortic valve is tricuspid. Aortic valve regurgitation is not  visualized.   6. The inferior vena cava is dilated in size with <50% respiratory  variability, suggesting right atrial pressure of 15 mmHg.  __________  LHC 06/19/2017: Origin to Prox Graft lesion, 99 %stenosed, in the SVG to OM. A STENT PROMUS PREM MR 4.0X38 drug eluting stent was successfully placed. IC Adenosine given prior to angioplasty. No distal protection used since it is a recently placed graft. Post intervention, there is a 0% residual stenosis. Dist Graft lesion, 75 %stenosed. There is more disease noted past the graft insertion in the OM and in the distal circumflex.   Continue dual antiplatelet therapy along with aggressive secondary prevention. __________   LHC 06/18/2017: Ost Cx to Prox Cx lesion, 100 %stenosed. Ost LM lesion, 50 %stenosed. Ost LAD lesion, 30 %stenosed. Mid RCA lesion, 95 %stenosed. SVG. Origin lesion, 100 %stenosed. SVG. Origin to Prox Graft lesion, 99 %stenosed. Dist Graft to Insertion lesion, 60 %stenosed. 1st Mrg lesion, 99 %stenosed. LIMA. Dist Graft lesion, 75 %stenosed.   1. Severe three-vessel coronary artery disease with heavily calcified, eccentric 50-60% stenosis distal left main, occluded proximal left circumflex, subtotal mid RCA. 2. Patent LIMA to mid LAD with discrete 75% stenosis near anastomotic site 3. Occluded SVG to PDA 4. Patent SVG to OM1 with a long diffuse 95% stenosis proximal segment of graft, with near 60-70% stenosis at the anastomotic site, 95% stenosis in the native vessel distal to the anastomotic site, with retrograde filling of left circumflex 5. Mildly reduced left ventricular function, with inferior-posterior wall akinesis   Recommendations   1. Transfer to [facility] for high risk, technically challenging PCI versus redo CABG __________   Intraoperative TEE  04/10/2017:  Left ventricle: Normal cavity size, wall thickness,  left ventricular diastolic function and left atrial pressure. LV systolic function is low normal with an EF of 50-55%. There are no obvious wall motion abnormalities. No thrombus present. No mass present.  Mitral valve: Mild regurgitation.  Right ventricle: Normal cavity size, wall thickness and ejection fraction.  Tricuspid valve: Mild regurgitation.     EKG:  EKG is ordered today.  The EKG ordered today demonstrates NSR, baseline artifact and wandering, poor R wave progression along the precordial leads, improved lateral ST-T changes  Recent Labs: 04/18/2023: ALT 18 04/20/2023: BUN 12; Creatinine, Ser 1.19; Hemoglobin 9.6; Magnesium 1.5; Platelets 137; Potassium 4.0; Sodium 138  Recent Lipid Panel    Component Value Date/Time   CHOL 91 06/18/2017 2337   TRIG 198 (H) 06/18/2017 2337   HDL 22 (L) 06/18/2017 2337   CHOLHDL 4.1 06/18/2017 2337   VLDL 40 06/18/2017 2337   LDLCALC 29 06/18/2017 2337    PHYSICAL EXAM:    VS:  BP (!) 148/60 (BP Location: Left Arm, Patient Position: Sitting, Cuff Size: Normal)   Pulse 62   Ht 6' (1.829 m)   Wt 186 lb 3.2 oz (84.5 kg)   SpO2 98%   BMI 25.25 kg/m   BMI: Body mass index is 25.25 kg/m.  Physical Exam Vitals reviewed.  Constitutional:      Appearance: He is well-developed.  HENT:     Head: Normocephalic and atraumatic.  Eyes:     General:        Right eye: No discharge.        Left eye: No discharge.  Neck:     Vascular: No JVD.  Cardiovascular:     Rate and Rhythm: Normal rate and regular rhythm.     Heart sounds: Normal heart sounds, S1 normal and S2 normal. Heart sounds not distant. No midsystolic click and no opening snap. No murmur heard.    No friction rub.  Pulmonary:     Effort: Pulmonary effort is normal. No respiratory distress.     Breath sounds: Examination of the right-lower field reveals decreased breath sounds. Decreased breath sounds present.  No wheezing or rales.  Chest:     Chest wall: No tenderness.  Abdominal:     General: There is no distension.  Musculoskeletal:     Cervical back: Normal range of motion.     Right lower leg: No edema.     Left lower leg: No edema.  Skin:    General: Skin is warm and dry.     Nails: There is no clubbing.  Neurological:     Mental Status: He is alert and oriented to person, place, and time.  Psychiatric:        Speech: Speech normal.        Behavior: Behavior normal.        Thought Content: Thought content normal.        Judgment: Judgment normal.     Wt Readings from Last 3 Encounters:  08/23/23 186 lb 3.2 oz (84.5 kg)  05/11/23 192 lb 12.8 oz (87.5 kg)  04/18/23 190 lb 0.6 oz (86.2 kg)     ASSESSMENT & PLAN:   CAD status post CABG status post PCI without angina without angina: He is without symptoms of angina or cardiac decompensation.  At his last visit we pursued echo in the setting of new EKG changes.  Demonstrated a new cardiomyopathy as outlined above.  Recommend R/LHC for further evaluation.  Continue aggressive risk factor modification and secondary prevention  including aspirin, atorvastatin, clopidogrel, and carvedilol.  HFrEF: Euvolemic and well compensated with NYHA class II symptoms.  Plan for [facility] as outlined above for further evaluation of his cardiomyopathy.  Discontinue Lotrel (last dose on the evening of 9/4).  Start Entresto 24/26 mg twice daily following 36-hour washout (starting on 9/7).  Continue carvedilol 12.5 mg twice daily (heart rate precludes titration) along with low-dose furosemide 20 mg daily.  Recommend further escalation of GDMT with addition of MRA, SGLT2 inhibitor, and titration of Entresto as tolerated in follow-up.  Will need to follow-up echo in several months time pending cardiac cath results and optimization of evidence-based medical therapy.  HTN: Blood pressure is mildly elevated in the office today, though he has not yet taken his  antihypertensive medications.  We are discontinuing Lo-Trol (last dose on the evening of 08/22/2023) with initiation of Entresto following 36-hour washout of ACE inhibitor (starting on 08/25/2023) with continuation of carvedilol.  Low-sodium diet encouraged.  HDL: LDL 28.  He remains on atorvastatin 80 mg.  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 Remains on DAPT and atorvastatin. Followed by vascular surgery and podiatry.   Mechanical fall complicated by multiple rib fractures and small right-sided pneumothorax: Hemodynamically stable.  Recommended follow-up chest x-ray, patient declined.  Patient made aware of risks of not following up on pneumothorax up to and including death.  He understands these risks in sound state of mind.   Informed Consent   Shared Decision Making/Informed Consent{  The risks [stroke (1 in 1000), death (1 in 1000), kidney failure [usually temporary] (1 in 500), bleeding (1 in 200), allergic reaction [possibly serious] (1 in 200)], benefits (diagnostic support and management of coronary artery disease) and alternatives of a cardiac catheterization were discussed in detail with Mr. Goren and he is willing to proceed.        Disposition: F/u with Dr. Mariah Milling or an APP 1-2 weeks after [facility].   Medication Adjustments/Labs and Tests Ordered: Current medicines are reviewed at length with the patient today.  Concerns regarding medicines are outlined above. Medication changes, Labs and Tests ordered today are summarized above and listed in the Patient Instructions accessible in Encounters.   SignedEula Listen, PA-C 08/23/2023 10:24 AM     [facility] [address] [place], [place] [address] [phone]

## 2023-08-22 NOTE — H&P (View-Only) (Signed)
Cardiology Office Note    Date:  08/23/2023   ID:  Kenneth Duarte [place], DOB March 25, 1946, MRN [id number]  PCP:  Kenneth Morgan, MD  Cardiologist:  Julien Nordmann, MD  Electrophysiologist:  None   Chief Complaint: Follow up  History of Present Illness:   Kenneth Duarte is a 77 y.o. 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   He was previously followed by [facility] cardiology.  LHC in 02/2017 showed multivessel CAD with the patient ultimately undergoing three-vessel bypass in 03/2017 as outlined above.  Intraoperative TEE showed an EF of 50 to 55%.  He underwent repeat cath in 06/2017 which showed severe native vessel CAD with patent LIMA to LAD with discrete 75% stenosis near the anastomotic site, occluded SVG to PDA, and patent SVG to OM1 with a long diffuse 95% stenosis in the proximal segment of the graft with near 60 to 70% stenosis at the anastomotic site.  There was 25% stenosis in the native vessel distal to the anastomotic site with retrograde filling of the LCx.  He was transferred to [facility] where he underwent repeat LHC with PCI/DES to the SVG to OM.  There was distal graft 75% stenosis with more disease noted past the graft insertion in the OM and distal LCx.     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  He was readmitted with nonhealing wound and underwent remaining fifth metatarsal bone amputation in 04/2023.  He was last seen in the office in 04/2023 and was without symptoms of angina or cardiac decompensation.  No cardiac changes were indicated at that time.  With new lateral ST-T changes on EKG, he underwent echo in 06/2023 that showed an EF of 35 to 40%, global hypokinesis, severely dilated LV internal cavity size, grade 1 diastolic dysfunction, normal RV systolic function and ventricular cavity size, mildly dilated left atrium, mild to moderate mitral regurgitation, and an estimated right atrial pressure of 15 mmHg.  He was seen at outside ER on 07/13/2023 after sustaining a mechanical fall slipping on a wet ramp leading to rib fractures in ribs 2 through 6.  X-ray was also remarkable for a small right-sided pneumothorax that persisted on repeat chest x-ray with concern for increased fluid component as well.  Patient declined hospital admission and left AMA.  He comes in doing well from a cardiac perspective and is without symptoms of angina or cardiac decompensation.  No dyspnea, palpitations, dizziness, presyncope, or syncope.  No further falls.  No hematochezia or melena.  No lower extremity swelling, abdominal distention, orthopnea, or early satiety.  Reports adherence to medications, has not yet taken his morning medications.  He does  not have any acute cardiac concerns at this time.   Labs independently reviewed: 04/2023 - Hgb 9.6, PLT 137, potassium 4.0, BUN 12, serum creatinine 1.19, magnesium 1.5, albumin 3.5, AST/ALT normal 03/2023 - A1c 7.0 06/2018 - TSH normal 06/2022 - TC 70, TG 86, HDL 32, LDL 28  Past Medical History:  Diagnosis Date   Arthritis    Chronic pain syndrome    Congestive heart failure (HCC) 1980   Coronary artery  disease    a. s/p 3V CABG (03/2017). b. NSTEMI 06/2017 with progressive graft disease, s/p DES to SVG-OM.   Diabetes (HCC)    DIET   Dyspnea    with exertion   ED (erectile dysfunction)    GERD (gastroesophageal reflux disease)    Gout    Headache    Hyperlipemia    Hypertension    CONTROLLED ON MEDS   IDA (iron deficiency anemia) 10/21/2019   Neuropathy of both feet    Seborrheic keratosis    Sinus congestion    Vertigo    Wears dentures    PARTIAL UPPER    Past Surgical History:  Procedure Laterality Date   COLONOSCOPY WITH PROPOFOL N/A 10/25/2015   Procedure: COLONOSCOPY WITH PROPOFOL;  Surgeon: Midge Minium, MD;  Location: [facility];  Service: Endoscopy;  Laterality: N/A;  DIABETIC-ORAL MEDS   COLONOSCOPY WITH PROPOFOL N/A 10/22/2020   Procedure: COLONOSCOPY CANCELED;  Surgeon: Midge Minium, MD;  Location: [facility];  Service: Endoscopy;  Laterality: N/A;  procedure aborted poor prep   COLONOSCOPY WITH PROPOFOL N/A 10/25/2020   Procedure: COLONOSCOPY WITH PROPOFOL;  Surgeon: Midge Minium, MD;  Location: [facility];  Service: Endoscopy;  Laterality: N/A;   CORONARY ARTERY BYPASS GRAFT N/A 04/10/2017   Procedure: CORONARY ARTERY BYPASS GRAFTING (CABG) x three , using left internal mammary artery and right leg greater saphenous vein harvested endoscopically;  Surgeon: Kerin Perna, MD;  Location: [facility] OR;  Service: Open Heart Surgery;  Laterality: N/A;   CORONARY ARTERY BYPASS GRAFT     CORONARY STENT INTERVENTION N/A 06/19/2017   Procedure: Coronary Stent Intervention;  Surgeon: Corky Crafts, MD;  Location: [facility] INVASIVE CV LAB;  Service: Cardiovascular;  Laterality: N/A;   ESOPHAGOGASTRODUODENOSCOPY (EGD) WITH PROPOFOL N/A 10/22/2020   Procedure: ESOPHAGOGASTRODUODENOSCOPY (EGD) WITH PROPOFOL;  Surgeon: Midge Minium, MD;  Location: [facility];  Service: Endoscopy;  Laterality: N/A;  Diabetic - oral meds   EXCISION PARTIAL PHALANX Right  03/22/2023   Procedure: EXCISION  PHALANX;  Surgeon: Rosetta Posner, DPM;  Location: [facility] ORS;  Service: Podiatry;  Laterality: Right;   INCISION AND DRAINAGE Right 04/19/2023   Procedure: INCISION AND DRAINAGE;  Surgeon: Linus Galas, DPM;  Location: [facility] ORS;  Service: Podiatry;  Laterality: Right;   IRRIGATION AND DEBRIDEMENT FOOT Right 03/22/2023   Procedure: IRRIGATION AND DEBRIDEMENT FOOT;  Surgeon: Rosetta Posner, DPM;  Location: [facility] ORS;  Service: Podiatry;  Laterality: Right;   KNEE SURGERY Right    over 20 years ago   LEFT HEART CATH AND CORONARY ANGIOGRAPHY Left 02/22/2017   Procedure: Left Heart Cath and Coronary Angiography;  Surgeon: Lamar Blinks, MD;  Location: [facility] INVASIVE CV LAB;  Service: Cardiovascular;  Laterality: Left;   LEFT HEART CATH AND CORS/GRAFTS ANGIOGRAPHY N/A 06/18/2017   Procedure: Left Heart Cath and Cors/Grafts Angiography and PCI;  Surgeon: Marcina Millard, MD;  Location: [facility] INVASIVE CV LAB;  Service: Cardiovascular;  Laterality: N/A;   LOWER EXTREMITY ANGIOGRAPHY Right 03/23/2023   Procedure:  Lower Extremity Angiography;  Surgeon: Annice Needy, MD;  Location: [facility] INVASIVE CV LAB;  Service: Cardiovascular;  Laterality: Right;   LOWER EXTREMITY INTERVENTION  03/23/2023   Procedure: LOWER EXTREMITY INTERVENTION;  Surgeon: Annice Needy, MD;  Location: [facility] INVASIVE CV LAB;  Service: Cardiovascular;;   POLYPECTOMY  10/25/2015   Procedure: POLYPECTOMY;  Surgeon: Midge Minium, MD;  Location: [facility];  Service: Endoscopy;;   ROTATOR CUFF REPAIR Right 2012   TEE WITHOUT CARDIOVERSION N/A 04/10/2017   Procedure: TRANSESOPHAGEAL ECHOCARDIOGRAM (TEE);  Surgeon: Kerin Perna, MD;  Location: [facility] OR;  Service: Open Heart Surgery;  Laterality: N/A;    Current Medications: Current Meds  Medication Sig   ACCU-CHEK AVIVA PLUS test strip    Accu-Chek Softclix Lancets lancets    Alcohol Swabs (B-D SINGLE USE SWABS REGULAR) PADS    aspirin 81 MG tablet Take 1 tablet  (81 mg total) by mouth daily.   atorvastatin (LIPITOR) 80 MG tablet TAKE 1 TABLET AT BEDTIME   carvedilol (COREG) 12.5 MG tablet TAKE 1 TABLET TWICE DAILY WITH MEALS   clopidogrel (PLAVIX) 75 MG tablet TAKE 1 TABLET EVERY DAY   docusate sodium (COLACE) 100 MG capsule Take 100 mg by mouth 2 (two) times daily as needed.    FEROSUL 325 (65 Fe) MG tablet TAKE 1 TABLET EVERY DAY   furosemide (LASIX) 20 MG tablet TAKE 1 TABLET EVERY DAY   gabapentin (NEURONTIN) 800 MG tablet Take 800 mg by mouth daily.   meclizine (ANTIVERT) 25 MG tablet TAKE 1 TABLET TWICE DAILY AS NEEDED FOR DIZZINESS   metFORMIN (GLUCOPHAGE-XR) 500 MG 24 hr tablet Take 500 mg by mouth 2 (two) times daily.    nitroGLYCERIN (NITROSTAT) 0.4 MG SL tablet Place 1 tablet (0.4 mg total) under the tongue every 5 (five) minutes as needed for chest pain.   oxyCODONE-acetaminophen (PERCOCET) 10-325 MG tablet Take 2 tablets by mouth every 6 (six) hours as needed for pain.   pantoprazole (PROTONIX) 40 MG tablet TAKE 1 TABLET EVERY DAY   vitamin B-12 (CYANOCOBALAMIN) 1000 MCG tablet Take 1,000 mcg by mouth daily.   VITAMIN D-1000 MAX ST 25 MCG (1000 UT) tablet Take 1,000 Units by mouth daily.   [DISCONTINUED] amlodipine-benazepril (LOTREL) 2.5-10 MG capsule Take 1 capsule by mouth daily.   [DISCONTINUED] sacubitril-valsartan (ENTRESTO) 24-26 MG Take 1 tablet by mouth 2 (two) times daily.    Allergies:   Blood-group specific substance and Cortisone acetate [cortisone]   Social History   Socioeconomic History   Marital status: Married    Spouse name: Not on file   Number of children: Not on file   Years of education: Not on file   Highest education level: Not on file  Occupational History   Not on file  Tobacco Use   Smoking status: Former    Current packs/day: 0.00    Average packs/day: 2.0 packs/day for 15.0 years (30.0 ttl pk-yrs)    Types: Cigarettes    Start date: 06/29/1964    Quit date: 06/30/1979    Years since quitting:  44.1   Smokeless tobacco: Never  Vaping Use   Vaping status: Never Used  Substance and Sexual Activity   Alcohol use: No    Alcohol/week: 0.0 standard drinks of alcohol    Comment: QUIT IN 1980   Drug use: No   Sexual activity: Yes    Partners: Male    Birth control/protection: Other-see comments  Other Topics Concern   Not on file  Social History Narrative   Not on file   Social Determinants of Health   Financial Resource Strain: Not on file  Food Insecurity: No Food Insecurity (04/18/2023)   Hunger Vital Sign    Worried About Running Out of Food in the Last Year: Never true    Ran Out of Food in the Last Year: Never true  Transportation Needs: No Transportation Needs (04/18/2023)   PRAPARE - Administrator, Civil Service (Medical): No    Lack of Transportation (Non-Medical): No  Physical Activity: Not on file  Stress: Not on file  Social Connections: Not on file     Family History:  The patient's family history includes Cancer in his father and mother; Diabetes in his brother and sister; Heart attack in his father; Heart disease in his brother and father; Prostate cancer in his brother. There is no history of Bladder Cancer or Kidney disease.  ROS:   12-point review of systems is negative unless otherwise noted in the HPI.   EKGs/Labs/Other Studies Reviewed:    Studies reviewed were summarized above. The additional studies were reviewed today:  2D echo 06/29/2023: 1. Left ventricular ejection fraction, by estimation, is 35 to 40%. The  left ventricle has moderately decreased function. The left ventricle  demonstrates global hypokinesis. The left ventricular internal cavity size  was severely dilated. Left  ventricular diastolic parameters are consistent with Grade I diastolic  dysfunction (impaired relaxation).   2. Right ventricular systolic function is normal. The right ventricular  size is normal.   3. Left atrial size was mildly dilated.   4. The  mitral valve is normal in structure. Mild to moderate mitral valve  regurgitation.   5. The aortic valve is tricuspid. Aortic valve regurgitation is not  visualized.   6. The inferior vena cava is dilated in size with <50% respiratory  variability, suggesting right atrial pressure of 15 mmHg.  __________  LHC 06/19/2017: Origin to Prox Graft lesion, 99 %stenosed, in the SVG to OM. A STENT PROMUS PREM MR 4.0X38 drug eluting stent was successfully placed. IC Adenosine given prior to angioplasty. No distal protection used since it is a recently placed graft. Post intervention, there is a 0% residual stenosis. Dist Graft lesion, 75 %stenosed. There is more disease noted past the graft insertion in the OM and in the distal circumflex.   Continue dual antiplatelet therapy along with aggressive secondary prevention. __________   LHC 06/18/2017: Ost Cx to Prox Cx lesion, 100 %stenosed. Ost LM lesion, 50 %stenosed. Ost LAD lesion, 30 %stenosed. Mid RCA lesion, 95 %stenosed. SVG. Origin lesion, 100 %stenosed. SVG. Origin to Prox Graft lesion, 99 %stenosed. Dist Graft to Insertion lesion, 60 %stenosed. 1st Mrg lesion, 99 %stenosed. LIMA. Dist Graft lesion, 75 %stenosed.   1. Severe three-vessel coronary artery disease with heavily calcified, eccentric 50-60% stenosis distal left main, occluded proximal left circumflex, subtotal mid RCA. 2. Patent LIMA to mid LAD with discrete 75% stenosis near anastomotic site 3. Occluded SVG to PDA 4. Patent SVG to OM1 with a long diffuse 95% stenosis proximal segment of graft, with near 60-70% stenosis at the anastomotic site, 95% stenosis in the native vessel distal to the anastomotic site, with retrograde filling of left circumflex 5. Mildly reduced left ventricular function, with inferior-posterior wall akinesis   Recommendations   1. [facility] for high risk, technically challenging PCI versus redo CABG __________   Intraoperative TEE  04/10/2017:  Left ventricle: Normal cavity size, wall thickness,  left ventricular diastolic function and left atrial pressure. LV systolic function is low normal with an EF of 50-55%. There are no obvious wall motion abnormalities. No thrombus present. No mass present.  Mitral valve: Mild regurgitation.  Right ventricle: Normal cavity size, wall thickness and ejection fraction.  Tricuspid valve: Mild regurgitation.     EKG:  EKG is ordered today.  The EKG ordered today demonstrates NSR, baseline artifact and wandering, poor R wave progression along the precordial leads, improved lateral ST-T changes  Recent Labs: 04/18/2023: ALT 18 04/20/2023: BUN 12; Creatinine, Ser 1.19; Hemoglobin 9.6; Magnesium 1.5; Platelets 137; Potassium 4.0; Sodium 138  Recent Lipid Panel    Component Value Date/Time   CHOL 91 06/18/2017 2337   TRIG 198 (H) 06/18/2017 2337   HDL 22 (L) 06/18/2017 2337   CHOLHDL 4.1 06/18/2017 2337   VLDL 40 06/18/2017 2337   LDLCALC 29 06/18/2017 2337    PHYSICAL EXAM:    VS:  BP (!) 148/60 (BP Location: Left Arm, Patient Position: Sitting, Cuff Size: Normal)   Pulse 62   Ht 6' (1.829 m)   Wt 186 lb 3.2 oz (84.5 kg)   SpO2 98%   BMI 25.25 kg/m   BMI: Body mass index is 25.25 kg/m.  Physical Exam Vitals reviewed.  Constitutional:      Appearance: He is well-developed.  HENT:     Head: Normocephalic and atraumatic.  Eyes:     General:        Right eye: No discharge.        Left eye: No discharge.  Neck:     Vascular: No JVD.  Cardiovascular:     Rate and Rhythm: Normal rate and regular rhythm.     Heart sounds: Normal heart sounds, S1 normal and S2 normal. Heart sounds not distant. No midsystolic click and no opening snap. No murmur heard.    No friction rub.  Pulmonary:     Effort: Pulmonary effort is normal. No respiratory distress.     Breath sounds: Examination of the right-lower field reveals decreased breath sounds. Decreased breath sounds present.  No wheezing or rales.  Chest:     Chest wall: No tenderness.  Abdominal:     General: There is no distension.  Musculoskeletal:     Cervical back: Normal range of motion.     Right lower leg: No edema.     Left lower leg: No edema.  Skin:    General: Skin is warm and dry.     Nails: There is no clubbing.  Neurological:     Mental Status: He is alert and oriented to person, place, and time.  Psychiatric:        Speech: Speech normal.        Behavior: Behavior normal.        Thought Content: Thought content normal.        Judgment: Judgment normal.     Wt Readings from Last 3 Encounters:  08/23/23 186 lb 3.2 oz (84.5 kg)  05/11/23 192 lb 12.8 oz (87.5 kg)  04/18/23 190 lb 0.6 oz (86.2 kg)     ASSESSMENT & PLAN:   CAD status post CABG status post PCI without angina without angina: He is without symptoms of angina or cardiac decompensation.  At his last visit we pursued echo in the setting of new EKG changes.  Demonstrated a new cardiomyopathy as outlined above.  Recommend R/LHC for further evaluation.  Continue aggressive risk factor modification and secondary prevention  including aspirin, atorvastatin, clopidogrel, and carvedilol.  HFrEF: Euvolemic and well compensated with NYHA class II symptoms.  Plan for [facility] as outlined above for further evaluation of his cardiomyopathy.  Discontinue Lotrel (last dose on the evening of 9/4).  Start Entresto 24/26 mg twice daily following 36-hour washout (starting on 9/7).  Continue carvedilol 12.5 mg twice daily (heart rate precludes titration) along with low-dose furosemide 20 mg daily.  Recommend further escalation of GDMT with addition of MRA, SGLT2 inhibitor, and titration of Entresto as tolerated in follow-up.  Will need to follow-up echo in several months time pending cardiac cath results and optimization of evidence-based medical therapy.  HTN: Blood pressure is mildly elevated in the office today, though he has not yet taken his  antihypertensive medications.  We are discontinuing Lo-Trol (last dose on the evening of 08/22/2023) with initiation of Entresto following 36-hour washout of ACE inhibitor (starting on 08/25/2023) with continuation of carvedilol.  Low-sodium diet encouraged.  HDL: LDL 28.  He remains on atorvastatin 80 mg.  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 Remains on DAPT and atorvastatin. Followed by vascular surgery and podiatry.   Mechanical fall complicated by multiple rib fractures and small right-sided pneumothorax: Hemodynamically stable.  Recommended follow-up chest x-ray, patient declined.  Patient made aware of risks of not following up on pneumothorax up to and including death.  He understands these risks in sound state of mind.   Informed Consent   Shared Decision Making/Informed Consent{  The risks [stroke (1 in 1000), death (1 in 1000), kidney failure [usually temporary] (1 in 500), bleeding (1 in 200), allergic reaction [possibly serious] (1 in 200)], benefits (diagnostic support and management of coronary artery disease) and alternatives of a cardiac catheterization were discussed in detail with Mr. Carte and he is willing to proceed.        Disposition: F/u with Dr. Mariah Milling or an APP 1-2 weeks after [facility].   Medication Adjustments/Labs and Tests Ordered: Current medicines are reviewed at length with the patient today.  Concerns regarding medicines are outlined above. Medication changes, Labs and Tests ordered today are summarized above and listed in the Patient Instructions accessible in Encounters.   SignedEula Listen, PA-C 08/23/2023 10:24 AM     [facility] [address] [place], [place] [address] [phone]

## 2023-08-23 ENCOUNTER — Encounter: Payer: Self-pay | Admitting: Physician Assistant

## 2023-08-23 ENCOUNTER — Ambulatory Visit: Payer: Medicare HMO | Attending: Physician Assistant | Admitting: Physician Assistant

## 2023-08-23 ENCOUNTER — Encounter: Payer: Self-pay | Admitting: Oncology

## 2023-08-23 VITALS — BP 148/60 | HR 62 | Ht 72.0 in | Wt 186.2 lb

## 2023-08-23 DIAGNOSIS — Z951 Presence of aortocoronary bypass graft: Secondary | ICD-10-CM | POA: Diagnosis not present

## 2023-08-23 DIAGNOSIS — E785 Hyperlipidemia, unspecified: Secondary | ICD-10-CM

## 2023-08-23 DIAGNOSIS — S2241XA Multiple fractures of ribs, right side, initial encounter for closed fracture: Secondary | ICD-10-CM

## 2023-08-23 DIAGNOSIS — I5020 Unspecified systolic (congestive) heart failure: Secondary | ICD-10-CM | POA: Diagnosis not present

## 2023-08-23 DIAGNOSIS — W19XXXA Unspecified fall, initial encounter: Secondary | ICD-10-CM

## 2023-08-23 DIAGNOSIS — I25810 Atherosclerosis of coronary artery bypass graft(s) without angina pectoris: Secondary | ICD-10-CM | POA: Diagnosis not present

## 2023-08-23 DIAGNOSIS — I10 Essential (primary) hypertension: Secondary | ICD-10-CM

## 2023-08-23 DIAGNOSIS — S270XXA Traumatic pneumothorax, initial encounter: Secondary | ICD-10-CM

## 2023-08-23 DIAGNOSIS — I739 Peripheral vascular disease, unspecified: Secondary | ICD-10-CM

## 2023-08-23 LAB — CBC
Hematocrit: 35 % — ABNORMAL LOW (ref 37.5–51.0)
Hemoglobin: 10.9 g/dL — ABNORMAL LOW (ref 13.0–17.7)
MCH: 27.2 pg (ref 26.6–33.0)
MCHC: 31.1 g/dL — ABNORMAL LOW (ref 31.5–35.7)
MCV: 87 fL (ref 79–97)
Platelets: 189 10*3/uL (ref 150–450)
RBC: 4.01 x10E6/uL — ABNORMAL LOW (ref 4.14–5.80)
RDW: 14.4 % (ref 11.6–15.4)
WBC: 5.4 10*3/uL (ref 3.4–10.8)

## 2023-08-23 LAB — BASIC METABOLIC PANEL
BUN/Creatinine Ratio: 15 (ref 10–24)
BUN: 15 mg/dL (ref 8–27)
CO2: 23 mmol/L (ref 20–29)
Calcium: 9.1 mg/dL (ref 8.6–10.2)
Chloride: 103 mmol/L (ref 96–106)
Creatinine, Ser: 1.02 mg/dL (ref 0.76–1.27)
Glucose: 118 mg/dL — ABNORMAL HIGH (ref 70–99)
Potassium: 5.3 mmol/L — ABNORMAL HIGH (ref 3.5–5.2)
Sodium: 141 mmol/L (ref 134–144)
eGFR: 77 mL/min/{1.73_m2} (ref >59)

## 2023-08-23 MED ORDER — ENTRESTO 24-26 MG PO TABS: 1.0000 | ORAL_TABLET | Freq: Two times a day (BID) | ORAL | 0 refills | Status: DC

## 2023-08-23 NOTE — Patient Instructions (Signed)
Medication Instructions:  Your physician recommends the following medication changes.  STOP TAKING: amlodipine-benazepril (LOTREL)   START TAKING: Entresto 24/26 mg twice daily on Sept 7  *If you need a refill on your cardiac medications before your next appointment, please call your pharmacy*  Lab Work: Your provider would like for you to have following labs drawn today CBC and BMET.   If you have labs (blood work) drawn today and your tests are completely normal, you will receive your results only by: MyChart Message (if you have MyChart) OR A paper copy in the mail If you have any lab test that is abnormal or we need to change your treatment, we will call you to review the results.   Testing/Procedures:  [facility] AT [facility] [address] [place] [address] Dept: [phone] Loc: [phone]  Aaron Malkin Drotar  08/23/2023  You are scheduled for a Right and Left Cardiac Catheterization on Friday, September 13 with Dr. Lorine Bears.  1. Please arrive at the [facility] Entrance of [facility], [address], [place] [address] at 8:30 AM (This is 1 hour(s) prior to your procedure time).  Proceed to the Check-In Desk directly inside the entrance.  Procedure Parking: Use the entrance off of the [address] side of the hospital. Turn right upon entering and follow the driveway to parking that is directly in front of the [facility]. There is no valet parking available at this entrance, however there is an awning directly in front of the [facility] Center for drop off/ pick up for patients.  Special note: Every effort is made to have your procedure done on time. Please understand that emergencies sometimes delay scheduled procedures.  2. Diet: Do not eat solid foods after midnight.  The patient may have clear liquids until 5am upon the day of the procedure.  3. Labs: You will have labs drawn today  4.  Medication instructions in preparation for your procedure:   Contrast Allergy: No   Current Outpatient Medications (Endocrine & Metabolic):    metFORMIN (GLUCOPHAGE-XR) 500 MG 24 hr tablet, Take 500 mg by mouth 2 (two) times daily.   Current Outpatient Medications (Cardiovascular):    amlodipine-benazepril (LOTREL) 2.5-10 MG capsule, Take 1 capsule by mouth daily.   atorvastatin (LIPITOR) 80 MG tablet, TAKE 1 TABLET AT BEDTIME   carvedilol (COREG) 12.5 MG tablet, TAKE 1 TABLET TWICE DAILY WITH MEALS   furosemide (LASIX) 20 MG tablet, TAKE 1 TABLET EVERY DAY   nitroGLYCERIN (NITROSTAT) 0.4 MG SL tablet, Place 1 tablet (0.4 mg total) under the tongue every 5 (five) minutes as needed for chest pain.   Current Outpatient Medications (Analgesics):    aspirin 81 MG tablet, Take 1 tablet (81 mg total) by mouth daily.   oxyCODONE-acetaminophen (PERCOCET) 10-325 MG tablet, Take 2 tablets by mouth every 6 (six) hours as needed for pain.  Current Outpatient Medications (Hematological):    clopidogrel (PLAVIX) 75 MG tablet, TAKE 1 TABLET EVERY DAY   FEROSUL 325 (65 Fe) MG tablet, TAKE 1 TABLET EVERY DAY   vitamin B-12 (CYANOCOBALAMIN) 1000 MCG tablet, Take 1,000 mcg by mouth daily.  Current Outpatient Medications (Other):    ACCU-CHEK AVIVA PLUS test strip,    Accu-Chek Softclix Lancets lancets,    Alcohol Swabs (B-D SINGLE USE SWABS REGULAR) PADS,    docusate sodium (COLACE) 100 MG capsule, Take 100 mg by mouth 2 (two) times daily as needed.  gabapentin (NEURONTIN) 800 MG tablet, Take 800 mg by mouth daily.   meclizine (ANTIVERT) 25 MG tablet, TAKE 1 TABLET TWICE DAILY AS NEEDED FOR DIZZINESS   pantoprazole (PROTONIX) 40 MG tablet, TAKE 1 TABLET EVERY DAY   VITAMIN D-1000 MAX ST 25 MCG (1000 UT) tablet, Take 1,000 Units by mouth daily.   valACYclovir (VALTREX) 1000 MG tablet, Take 1,000 mg by mouth as needed. (Patient not taking: Reported on 05/11/2023) *For reference purposes while  preparing patient instructions.   Delete this med list prior to printing instructions for patient.*   Stop taking, Lasix (Furosemide)  Thursday, September 12, and then resume after the procedure   Do not take Diabetes Med Glucophage (Metformin) on the day of the procedure and HOLD 48 HOURS AFTER THE PROCEDURE.  On the morning of your procedure, take your Aspirin 81 mg and Plavix/Clopidogrel and any morning medicines NOT listed above.  You may use sips of water.  5. Plan to go home the same day, you will only stay overnight if medically necessary. 6. Bring a current list of your medications and current insurance cards. 7. You MUST have a responsible person to drive you home. 8. Someone MUST be with you the first 24 hours after you arrive home or your discharge will be delayed. 9. Please wear clothes that are easy to get on and off and wear slip-on shoes.  Thank you for allowing [place] to care for you!   -- [facility]  Follow-Up: At [facility], you and your health needs are our priority.  As part of our continuing mission to provide you with exceptional heart care, we have created designated Provider Care Teams.  These Care Teams include your primary Cardiologist (physician) and Advanced Practice Providers (APPs -  Physician Assistants and Nurse Practitioners) who all work together to provide you with the care you need, when you need it.  We recommend signing up for the patient portal called "MyChart".  Sign up information is provided on this After Visit Summary.  MyChart is used to connect with patients for Virtual Visits (Telemedicine).  Patients are able to view lab/test results, encounter notes, upcoming appointments, etc.  Non-urgent messages can be sent to your provider as well.   To learn more about what you can do with MyChart, go to [email or web address].    Your next appointment:   1 to 2 week(s) after your procedure  Provider:   You  may see Julien Nordmann, MD or one of the following Advanced Practice Providers on your designated Care Team:   Eula Listen, [place]

## 2023-08-24 ENCOUNTER — Other Ambulatory Visit: Payer: Self-pay

## 2023-08-24 DIAGNOSIS — Z79899 Other long term (current) drug therapy: Secondary | ICD-10-CM

## 2023-08-27 ENCOUNTER — Other Ambulatory Visit: Payer: Self-pay

## 2023-08-27 DIAGNOSIS — Z79899 Other long term (current) drug therapy: Secondary | ICD-10-CM

## 2023-08-28 LAB — BASIC METABOLIC PANEL
BUN/Creatinine Ratio: 11 (ref 10–24)
BUN: 13 mg/dL (ref 8–27)
CO2: 23 mmol/L (ref 20–29)
Calcium: 8.9 mg/dL (ref 8.6–10.2)
Chloride: 105 mmol/L (ref 96–106)
Creatinine, Ser: 1.23 mg/dL (ref 0.76–1.27)
Glucose: 179 mg/dL — ABNORMAL HIGH (ref 70–99)
Potassium: 5.1 mmol/L (ref 3.5–5.2)
Sodium: 141 mmol/L (ref 134–144)
eGFR: 61 mL/min/{1.73_m2} (ref >59)

## 2023-08-30 ENCOUNTER — Telehealth: Payer: Self-pay | Admitting: *Deleted

## 2023-08-30 NOTE — Telephone Encounter (Signed)
Reviewed date, time, and location for his procedure scheduled for tomorrow. He states that he was already called and is aware of the instructions with no questions. No further needs.

## 2023-08-31 ENCOUNTER — Ambulatory Visit
Admission: RE | Admit: 2023-08-31 | Discharge: 2023-08-31 | Disposition: A | Discharge status: Home / Self Care | Payer: Medicare HMO | Attending: Cardiovascular Disease | Admitting: Cardiovascular Disease

## 2023-08-31 ENCOUNTER — Encounter
Admission: RE | Disposition: A | Discharge status: Home / Self Care | Payer: Self-pay | Source: Home / Self Care | Attending: Cardiovascular Disease

## 2023-08-31 ENCOUNTER — Other Ambulatory Visit: Payer: Self-pay

## 2023-08-31 ENCOUNTER — Encounter: Payer: Self-pay | Admitting: Cardiovascular Disease

## 2023-08-31 DIAGNOSIS — Z955 Presence of coronary angioplasty implant and graft: Secondary | ICD-10-CM | POA: Insufficient documentation

## 2023-08-31 DIAGNOSIS — Z7902 Long term (current) use of antithrombotics/antiplatelets: Secondary | ICD-10-CM | POA: Diagnosis not present

## 2023-08-31 DIAGNOSIS — I2582 Chronic total occlusion of coronary artery: Secondary | ICD-10-CM | POA: Insufficient documentation

## 2023-08-31 DIAGNOSIS — Z79899 Other long term (current) drug therapy: Secondary | ICD-10-CM | POA: Diagnosis not present

## 2023-08-31 DIAGNOSIS — I5020 Unspecified systolic (congestive) heart failure: Secondary | ICD-10-CM

## 2023-08-31 DIAGNOSIS — Z7982 Long term (current) use of aspirin: Secondary | ICD-10-CM | POA: Insufficient documentation

## 2023-08-31 DIAGNOSIS — I110 Hypertensive heart disease with heart failure: Secondary | ICD-10-CM | POA: Insufficient documentation

## 2023-08-31 DIAGNOSIS — I272 Pulmonary hypertension, unspecified: Secondary | ICD-10-CM | POA: Insufficient documentation

## 2023-08-31 DIAGNOSIS — E785 Hyperlipidemia, unspecified: Secondary | ICD-10-CM | POA: Diagnosis not present

## 2023-08-31 DIAGNOSIS — E1142 Type 2 diabetes mellitus with diabetic polyneuropathy: Secondary | ICD-10-CM | POA: Insufficient documentation

## 2023-08-31 DIAGNOSIS — I2584 Coronary atherosclerosis due to calcified coronary lesion: Secondary | ICD-10-CM | POA: Insufficient documentation

## 2023-08-31 DIAGNOSIS — I25810 Atherosclerosis of coronary artery bypass graft(s) without angina pectoris: Secondary | ICD-10-CM

## 2023-08-31 DIAGNOSIS — I25708 Atherosclerosis of coronary artery bypass graft(s), unspecified, with other forms of angina pectoris: Secondary | ICD-10-CM | POA: Diagnosis present

## 2023-08-31 DIAGNOSIS — Z87891 Personal history of nicotine dependence: Secondary | ICD-10-CM | POA: Insufficient documentation

## 2023-08-31 HISTORY — PX: RIGHT/LEFT HEART CATH AND CORONARY/GRAFT ANGIOGRAPHY: CATH118267

## 2023-08-31 LAB — POCT I-STAT EG7
Acid-Base Excess: 1 mmol/L (ref 0.0–2.0)
Bicarbonate: 27.2 mmol/L (ref 20.0–28.0)
Calcium, Ion: 1.2 mmol/L (ref 1.15–1.40)
HCT: 31 % — ABNORMAL LOW (ref 39.0–52.0)
Hemoglobin: 10.5 g/dL — ABNORMAL LOW (ref 13.0–17.0)
O2 Saturation: 63 %
Potassium: 5.1 mmol/L (ref 3.5–5.1)
Sodium: 141 mmol/L (ref 135–145)
TCO2: 29 mmol/L (ref 22–32)
pCO2, Ven: 52 mmHg (ref 44–60)
pH, Ven: 7.326 (ref 7.25–7.43)
pO2, Ven: 36 mmHg (ref 32–45)

## 2023-08-31 LAB — POCT I-STAT 7, (LYTES, BLD GAS, ICA,H+H)
Acid-Base Excess: 0 mmol/L (ref 0.0–2.0)
Bicarbonate: 25.3 mmol/L (ref 20.0–28.0)
Calcium, Ion: 1.19 mmol/L (ref 1.15–1.40)
HCT: 31 % — ABNORMAL LOW (ref 39.0–52.0)
Hemoglobin: 10.5 g/dL — ABNORMAL LOW (ref 13.0–17.0)
O2 Saturation: 96 %
Potassium: 5.2 mmol/L — ABNORMAL HIGH (ref 3.5–5.1)
Sodium: 140 mmol/L (ref 135–145)
TCO2: 27 mmol/L (ref 22–32)
pCO2 arterial: 43.6 mmHg (ref 32–48)
pH, Arterial: 7.371 (ref 7.35–7.45)
pO2, Arterial: 84 mmHg (ref 83–108)

## 2023-08-31 LAB — GLUCOSE, CAPILLARY: Glucose-Capillary: 118 mg/dL — ABNORMAL HIGH (ref 70–99)

## 2023-08-31 SURGERY — RIGHT/LEFT HEART CATH AND CORONARY/GRAFT ANGIOGRAPHY
Anesthesia: Moderate Sedation | Laterality: Bilateral

## 2023-08-31 MED ORDER — HEPARIN (PORCINE) IN NACL 1000-0.9 UT/500ML-% IV SOLN
INTRAVENOUS | Status: AC
  Filled 2023-08-31: qty 1000

## 2023-08-31 MED ORDER — MIDAZOLAM HCL 2 MG/2ML IJ SOLN
INTRAMUSCULAR | Status: DC | PRN
  Administered 2023-08-31 (×2): 1 mg via INTRAVENOUS

## 2023-08-31 MED ORDER — HEPARIN SODIUM (PORCINE) 1000 UNIT/ML IJ SOLN
INTRAMUSCULAR | Status: DC | PRN
  Administered 2023-08-31: 4000 [IU] via INTRAVENOUS

## 2023-08-31 MED ORDER — LIDOCAINE HCL 1 % IJ SOLN
INTRAMUSCULAR | Status: AC
  Filled 2023-08-31: qty 20

## 2023-08-31 MED ORDER — SODIUM CHLORIDE 0.9 % IV SOLN: INTRAVENOUS | Status: DC

## 2023-08-31 MED ORDER — SODIUM CHLORIDE 0.9 % IV SOLN: 250.0000 mL | INTRAVENOUS | Status: DC | PRN

## 2023-08-31 MED ORDER — ACETAMINOPHEN 325 MG PO TABS: 650.0000 mg | ORAL_TABLET | ORAL | Status: DC | PRN

## 2023-08-31 MED ORDER — VERAPAMIL HCL 2.5 MG/ML IV SOLN
INTRAVENOUS | Status: AC
  Filled 2023-08-31: qty 2

## 2023-08-31 MED ORDER — FENTANYL CITRATE (PF) 100 MCG/2ML IJ SOLN
INTRAMUSCULAR | Status: AC
  Filled 2023-08-31: qty 2

## 2023-08-31 MED ORDER — SODIUM CHLORIDE 0.9% FLUSH: 3.0000 mL | Freq: Two times a day (BID) | INTRAVENOUS | Status: DC

## 2023-08-31 MED ORDER — SODIUM CHLORIDE 0.9% FLUSH: 3.0000 mL | INTRAVENOUS | Status: DC | PRN

## 2023-08-31 MED ORDER — IOHEXOL 300 MG/ML  SOLN
INTRAMUSCULAR | Status: DC | PRN
  Administered 2023-08-31: 60 mL

## 2023-08-31 MED ORDER — ONDANSETRON HCL 4 MG/2ML IJ SOLN: 4.0000 mg | Freq: Four times a day (QID) | INTRAMUSCULAR | Status: DC | PRN

## 2023-08-31 MED ORDER — HEPARIN (PORCINE) IN NACL 1000-0.9 UT/500ML-% IV SOLN
INTRAVENOUS | Status: DC | PRN
  Administered 2023-08-31 (×2): 500 mL

## 2023-08-31 MED ORDER — FENTANYL CITRATE (PF) 100 MCG/2ML IJ SOLN
INTRAMUSCULAR | Status: DC | PRN
  Administered 2023-08-31 (×2): 25 ug via INTRAVENOUS

## 2023-08-31 MED ORDER — ASPIRIN 81 MG PO CHEW: 81.0000 mg | CHEWABLE_TABLET | ORAL | Status: DC

## 2023-08-31 MED ORDER — LIDOCAINE HCL (PF) 1 % IJ SOLN
INTRAMUSCULAR | Status: DC | PRN
  Administered 2023-08-31 (×2): 2 mL

## 2023-08-31 MED ORDER — VERAPAMIL HCL 2.5 MG/ML IV SOLN
INTRAVENOUS | Status: DC | PRN
  Administered 2023-08-31: 2.5 mg via INTRA_ARTERIAL

## 2023-08-31 MED ORDER — MIDAZOLAM HCL 2 MG/2ML IJ SOLN
INTRAMUSCULAR | Status: AC
  Filled 2023-08-31: qty 2

## 2023-08-31 MED ORDER — HEPARIN SODIUM (PORCINE) 1000 UNIT/ML IJ SOLN
INTRAMUSCULAR | Status: AC
  Filled 2023-08-31: qty 10

## 2023-08-31 SURGICAL SUPPLY — 16 items
CATH BALLN WEDGE 5F 110CM (CATHETERS) IMPLANT
CATH INFINITI 5 FR IM (CATHETERS) IMPLANT
CATH INFINITI 5FR AL1 (CATHETERS) IMPLANT
CATH INFINITI AMBI 5FR TG (CATHETERS) IMPLANT
DEVICE RAD TR BAND REGULAR (VASCULAR PRODUCTS) IMPLANT
DRAPE BRACHIAL (DRAPES) IMPLANT
GLIDESHEATH SLEND SS 6F .021 (SHEATH) IMPLANT
GUIDEWIRE .025 260CM (WIRE) IMPLANT
GUIDEWIRE INQWIRE 1.5J.035X260 (WIRE) IMPLANT
INQWIRE 1.5J .035X260CM (WIRE) ×1
KIT SYRINGE INJ CVI SPIKEX1 (MISCELLANEOUS) IMPLANT
PACK CARDIAC CATH (CUSTOM PROCEDURE TRAY) ×1 IMPLANT
PROTECTION STATION PRESSURIZED (MISCELLANEOUS) ×1
SET ATX-X65L (MISCELLANEOUS) IMPLANT
SHEATH GLIDE SLENDER 4/5FR (SHEATH) IMPLANT
STATION PROTECTION PRESSURIZED (MISCELLANEOUS) IMPLANT

## 2023-08-31 NOTE — Interval H&P Note (Signed)
History and Physical Interval Note:  08/31/2023 9:31 AM  Kenneth Duarte  has presented today for surgery, with the diagnosis of R and L Cath   HF w reduced EF.  The various methods of treatment have been discussed with the patient and family. After consideration of risks, benefits and other options for treatment, the patient has consented to  Procedure(s): RIGHT/LEFT HEART CATH AND CORONARY ANGIOGRAPHY (Bilateral) as a surgical intervention.  The patient's history has been reviewed, patient examined, no change in status, stable for surgery.  I have reviewed the patient's chart and labs.  Questions were answered to the patient's satisfaction.     Lorine Bears

## 2023-09-06 ENCOUNTER — Other Ambulatory Visit: Payer: Self-pay | Admitting: Cardiovascular Disease

## 2023-09-10 ENCOUNTER — Encounter: Payer: Self-pay | Admitting: Oncology

## 2023-09-10 ENCOUNTER — Other Ambulatory Visit: Payer: Self-pay | Admitting: Cardiovascular Disease

## 2023-09-10 NOTE — Progress Notes (Unsigned)
Cardiology Office Note    Date:  09/11/2023   ID:  Kenneth Duarte, DOB 1946-11-03, MRN [id number]  PCP:  Emogene Morgan, MD  Cardiologist:  Julien Nordmann, MD  Electrophysiologist:  None   Chief Complaint: Follow-up  History of Present Illness:   Kenneth Duarte is a 77 y.o. 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   He was previously followed by [facility] cardiology.  LHC in 02/2017 showed multivessel CAD with the patient ultimately undergoing three-vessel bypass in 03/2017 as outlined above.  Intraoperative TEE showed an EF of 50 to 55%.  He underwent repeat cath in 06/2017 which showed severe native vessel CAD with patent LIMA to LAD with discrete 75% stenosis near the anastomotic site, occluded SVG to PDA, and patent SVG to OM1 with a long diffuse 95% stenosis in the proximal segment of the graft with near 60 to 70% stenosis at the anastomotic site.  There was 25% stenosis in the native vessel distal to the anastomotic site with retrograde filling of the LCx.  He was transferred to [facility] where he underwent repeat LHC with PCI/DES to the SVG to OM.  There was distal graft 75% stenosis with more disease noted past the graft insertion in the OM and distal LCx.     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  He was readmitted with nonhealing wound and underwent remaining fifth metatarsal bone amputation in 04/2023.   He was seen in the office in 04/2023 and was without symptoms of angina or cardiac decompensation.  No cardiac changes were indicated at that time.  With new lateral ST-T changes on EKG, he underwent echo in 06/2023 that showed an EF of 35 to 40%, global hypokinesis, severely dilated LV internal cavity size, grade 1 diastolic dysfunction, normal RV systolic function and ventricular cavity size, mildly dilated left atrium, mild to moderate mitral regurgitation, and an estimated right atrial pressure of 15 mmHg.   He was seen at outside ER on 07/13/2023 after sustaining a mechanical fall slipping on a wet ramp leading to rib fractures in ribs 2 through 6.  X-ray was also remarkable for a small right-sided pneumothorax that persisted on repeat chest x-ray with concern for increased fluid component as well.  Patient declined hospital admission and left AMA.  He was last seen in the office on 08/23/2023 and remained without symptoms of angina or cardiac decompensation.  Given new cardiomyopathy, and EKG changes, he underwent R/LHC on 08/31/2023 showed significant underlying three-vessel CAD with chronically occluded ostial LCx and chronic subtotal occlusion of the mid RCA.  SVG to rPDA was chronically occluded.  SVG to OM, which was  stented previously, was now chronically occluded at the ostium.  LIMA to LAD was patent with no significant anastomosis stenosis.  The distal LAD provided collaterals to the rPDA and a diagonal branch provided collaterals to the OM's.  RHC showed normal filling pressures, minimal pulmonary pretension, and normal cardiac output.  There were no PCI options with both RCA and LCx receiving collaterals from the LAD.  Medical therapy  was recommended.  He comes in doing well from a cardiac perspective and is without symptoms of angina or cardiac decompensation.  No progressive dyspnea, lower extremity swelling, abdominal distention, orthopnea, PND, early satiety.  No falls or symptoms concerning for bleeding.  Adherent and tolerating cardiac medications including recently initiated Entresto.  Weight stable.   Labs independently reviewed: 08/2023 - BUN 13, serum creatinine 1.23, potassium 5.1, Hgb 10.9, PLT 189 04/2023 - magnesium 1.5, albumin 3.5, AST/ALT normal 03/2023 - A1c 7.0 06/2018 - TSH normal 06/2022 - TC 70, TG 86, HDL 32, LDL 28  Past Medical History:  Diagnosis Date   Arthritis    Chronic pain syndrome    Congestive heart failure (HCC) 1980   Coronary artery disease    a. s/p 3V CABG (03/2017). b. NSTEMI 06/2017 with progressive graft disease, s/p DES to SVG-OM.   Diabetes (HCC)    DIET   Dyspnea    with exertion   ED (erectile dysfunction)    GERD (gastroesophageal reflux disease)    Gout    Headache    Hyperlipemia    Hypertension    CONTROLLED ON MEDS   IDA (iron deficiency anemia) 10/21/2019   Neuropathy of both feet    Seborrheic keratosis    Sinus congestion    Vertigo    Wears dentures    PARTIAL UPPER    Past Surgical History:  Procedure Laterality Date   COLONOSCOPY WITH PROPOFOL N/A 10/25/2015   Procedure: COLONOSCOPY WITH PROPOFOL;  Surgeon: Midge Minium, MD;  Location: [facility];  Service: Endoscopy;  Laterality: N/A;  DIABETIC-ORAL MEDS   COLONOSCOPY WITH PROPOFOL N/A 10/22/2020   Procedure: COLONOSCOPY CANCELED;  Surgeon: Midge Minium, MD;  Location: [facility];  Service: Endoscopy;  Laterality: N/A;  procedure aborted poor prep   COLONOSCOPY WITH PROPOFOL N/A 10/25/2020   Procedure: COLONOSCOPY WITH PROPOFOL;  Surgeon: Midge Minium, MD;  Location: [facility];  Service: Endoscopy;  Laterality: N/A;   CORONARY ARTERY BYPASS GRAFT N/A 04/10/2017   Procedure:  CORONARY ARTERY BYPASS GRAFTING (CABG) x three , using left internal mammary artery and right leg greater saphenous vein harvested endoscopically;  Surgeon: Kerin Perna, MD;  Location: [facility] OR;  Service: Open Heart Surgery;  Laterality: N/A;   CORONARY ARTERY BYPASS GRAFT     CORONARY STENT INTERVENTION N/A 06/19/2017   Procedure: Coronary Stent Intervention;  Surgeon: Corky Crafts, MD;  Location: [facility] INVAS[facility] CV LAB;  Service: Cardiovascular;  Laterality: N/A;   ESOPHAGOGASTRODUODENOSCOPY (EGD) WITH PROPOFOL N/A 10/22/2020   Procedure: ESOPHAGOGASTRODUODENOSCOPY (EGD) WITH PROPOFOL;  Surgeon: Midge Minium, MD;  Location: [facility];  Service: Endoscopy;  Laterality: N/A;  Diabetic - oral meds   EXCISION PARTIAL PHALANX Right 03/22/2023   Procedure: EXCISION  PHALANX;  Surgeon: Rosetta Posner, DPM;  Location: [facility] ORS;  Service: Podiatry;  Laterality: Right;   INCISION AND DRAINAGE Right 04/19/2023   Procedure: INCISION AND DRAINAGE;  Surgeon: Linus Galas, DPM;  Location: [facility];  Service: Podiatry;  Laterality: Right;   IRRIGATION AND DEBRIDEMENT FOOT Right 03/22/2023  Procedure: IRRIGATION AND DEBRIDEMENT FOOT;  Surgeon: Rosetta Posner, DPM;  Location: [facility];  Service: Podiatry;  Laterality: Right;   KNEE SURGERY Right    over 20 years ago   LEFT HEART CATH AND CORONARY ANGIOGRAPHY Left 02/22/2017   Procedure: Left Heart Cath and Coronary Angiography;  Surgeon: Lamar Blinks, MD;  Location: [facility] INVASIVE CV LAB;  Service: Cardiovascular;  Laterality: Left;   LEFT HEART CATH AND CORS/GRAFTS ANGIOGRAPHY N/A 06/18/2017   Procedure: Left Heart Cath and Cors/Grafts Angiography and PCI;  Surgeon: Marcina Millard, MD;  Location: [facility] INVASIVE CV LAB;  Service: Cardiovascular;  Laterality: N/A;   LOWER EXTREMITY ANGIOGRAPHY Right 03/23/2023   Procedure: Lower Extremity Angiography;  Surgeon: Annice Needy, MD;  Location: [facility] INVAS[facility] CV LAB;  Service: Cardiovascular;  Laterality: Right;    LOWER EXTREMITY INTERVENTION  03/23/2023   Procedure: LOWER EXTREMITY INTERVENTION;  Surgeon: Annice Needy, MD;  Location: [facility] INVASIVE CV LAB;  Service: Cardiovascular;;   POLYPECTOMY  10/25/2015   Procedure: POLYPECTOMY;  Surgeon: Midge Minium, MD;  Location: [facility];  Service: Endoscopy;;   RIGHT/LEFT HEART CATH AND CORONARY/GRAFT ANGIOGRAPHY Bilateral 08/31/2023   Procedure: RIGHT/LEFT HEART CATH AND CORONARY/GRAFT ANGIOGRAPHY;  Surgeon: Iran Ouch, MD;  Location: [facility] INVASIVE CV LAB;  Service: Cardiovascular;  Laterality: Bilateral;   ROTATOR CUFF REPAIR Right 2012   TEE WITHOUT CARDIOVERSION N/A 04/10/2017   Procedure: TRANSESOPHAGEAL ECHOCARDIOGRAM (TEE);  Surgeon: Kerin Perna, MD;  Location: [facility] OR;  Service: Open Heart Surgery;  Laterality: N/A;    Current Medications: Current Meds  Medication Sig   ACCU-CHEK AVIVA PLUS test strip    Accu-Chek Softclix Lancets lancets    Alcohol Swabs (B-D SINGLE USE SWABS REGULAR) PADS    aspirin 81 MG tablet Take 1 tablet (81 mg total) by mouth daily.   atorvastatin (LIPITOR) 80 MG tablet TAKE 1 TABLET AT BEDTIME   carvedilol (COREG) 12.5 MG tablet TAKE 1 TABLET TWICE DAILY WITH MEALS   clopidogrel (PLAVIX) 75 MG tablet TAKE 1 TABLET EVERY DAY   [START ON 10/11/2023] dapagliflozin propanediol (FARXIGA) 10 MG TABS tablet Take 1 tablet (10 mg total) by mouth daily before breakfast.   docusate sodium (COLACE) 100 MG capsule Take 100 mg by mouth 2 (two) times daily as needed.    FEROSUL 325 (65 Fe) MG tablet TAKE 1 TABLET EVERY DAY   furosemide (LASIX) 20 MG tablet TAKE 1 TABLET EVERY DAY   gabapentin (NEURONTIN) 800 MG tablet Take 800 mg by mouth daily.   meclizine (ANTIVERT) 25 MG tablet TAKE 1 TABLET TWICE DAILY AS NEEDED FOR DIZZINESS   metFORMIN (GLUCOPHAGE-XR) 500 MG 24 hr tablet Take 500 mg by mouth 2 (two) times daily.    nitroGLYCERIN (NITROSTAT) 0.4 MG SL tablet Place 1 tablet (0.4 mg total) under the tongue every 5  (five) minutes as needed for chest pain.   oxyCODONE-acetaminophen (PERCOCET) 10-325 MG tablet Take 2 tablets by mouth every 6 (six) hours as needed for pain.   pantoprazole (PROTONIX) 40 MG tablet TAKE 1 TABLET EVERY DAY   sacubitril-valsartan (ENTRESTO) 24-26 MG Take 1 tablet by mouth 2 (two) times daily.   valACYclovir (VALTREX) 1000 MG tablet Take 1,000 mg by mouth as needed.   vitamin B-12 (CYANOCOBALAMIN) 1000 MCG tablet Take 1,000 mcg by mouth daily.   VITAMIN D-1000 MAX ST 25 MCG (1000 UT) tablet Take 1,000 Units by mouth daily.   [DISCONTINUED] dapagliflozin propanediol (FARXIGA) 10 MG TABS tablet Take 1  tablet (10 mg total) by mouth daily before breakfast.    Allergies:   Blood-group specific substance and Cortisone acetate [cortisone]   Social History   Socioeconomic History   Marital status: Married    Spouse name: Not on file   Number of children: Not on file   Years of education: Not on file   Highest education level: Not on file  Occupational History   Not on file  Tobacco Use   Smoking status: Former    Current packs/day: 0.00    Average packs/day: 2.0 packs/day for 15.0 years (30.0 ttl pk-yrs)    Types: Cigarettes    Start date: 06/29/1964    Quit date: 06/30/1979    Years since quitting: 44.2   Smokeless tobacco: Never  Vaping Use   Vaping status: Never Used  Substance and Sexual Activity   Alcohol use: No    Alcohol/week: 0.0 standard drinks of alcohol    Comment: QUIT IN 1980   Drug use: No   Sexual activity: Yes    Partners: Male    Birth control/protection: Other-see comments  Other Topics Concern   Not on file  Social History Narrative   Not on file   Social Determinants of Health   Financial Resource Strain: Not on file  Food Insecurity: No Food Insecurity (04/18/2023)   Hunger Vital Sign    Worried About Running Out of Food in the Last Year: Never true    Ran Out of Food in the Last Year: Never true  Transportation Needs: No Transportation  Needs (04/18/2023)   PRAPARE - Administrator, Civil Service (Medical): No    Lack of Transportation (Non-Medical): No  Physical Activity: Not on file  Stress: Not on file  Social Connections: Not on file     Family History:  The patient's family history includes Cancer in his father and mother; Diabetes in his brother and sister; Heart attack in his father; Heart disease in his brother and father; Prostate cancer in his brother. There is no history of Bladder Cancer or Kidney disease.  ROS:   12-point review of systems is negative unless otherwise noted in the HPI.   EKGs/Labs/Other Studies Reviewed:    Studies reviewed were summarized above. The additional studies were reviewed today:  [facility] 08/31/2023:   Ost LAD lesion is 30% stenosed.   Ost Cx to Prox Cx lesion is 100% stenosed.   Mid Cx lesion is 80% stenosed.   Origin lesion is 100% stenosed.   1st Mrg lesion is 99% stenosed.   Prox RCA to Mid RCA lesion is 99% stenosed.   Origin to Prox Graft lesion is 100% stenosed.   Prox LAD to Mid LAD lesion is 80% stenosed.   Dist LM to Ost LAD lesion is 50% stenosed.   SVG.   LIMA and is normal in caliber.   1 significant underlying three-vessel coronary artery disease with chronically occluded ostial left circumflex and chronic subtotal occlusion of the mid right coronary artery.  SVG to right PDA is chronically occluded.  SVG to OM which was stented before is now chronically occluded at the ostium.  LIMA to LAD is patent with no significant anastomosis stenosis.  Distal LAD provides collaterals to the right PDA and the diagonal branch provides collaterals to OMs. 2.  Left ventricular angiography was not performed.  EF was moderately reduced by echo. 3.  Right heart catheterization showed normal filling pressures, minimal pulmonary hypertension and normal cardiac output.  RA: 9 mmHg RV: 33/4 with an EDP of 9 mmHg PW: 10 mmHg PA: 35/13 with a mean of 21 mmHg Cardiac  output is 5.64 with an index of 2.72.   Recommendations: Recommend medical therapy for coronary artery disease.  No PCI options. Both RCA and left circumflex get collaterals from the LAD. __________  2D echo 06/29/2023: 1. Left ventricular ejection fraction, by estimation, is 35 to 40%. The  left ventricle has moderately decreased function. The left ventricle  demonstrates global hypokinesis. The left ventricular internal cavity size  was severely dilated. Left  ventricular diastolic parameters are consistent with Grade I diastolic  dysfunction (impaired relaxation).   2. Right ventricular systolic function is normal. The right ventricular  size is normal.   3. Left atrial size was mildly dilated.   4. The mitral valve is normal in structure. Mild to moderate mitral valve  regurgitation.   5. The aortic valve is tricuspid. Aortic valve regurgitation is not  visualized.   6. The inferior vena cava is dilated in size with <50% respiratory  variability, suggesting right atrial pressure of 15 mmHg.  __________   LHC 06/19/2017: Origin to Prox Graft lesion, 99 %stenosed, in the SVG to OM. A STENT PROMUS PREM MR [id number] drug eluting stent was successfully placed. IC Adenosine given prior to angioplasty. No distal protection used since it is a recently placed graft. Post intervention, there is a 0% residual stenosis. Dist Graft lesion, 75 %stenosed. There is more disease noted past the graft insertion in the OM and in the distal circumflex.   Continue dual antiplatelet therapy along with aggressive secondary prevention. __________   LHC 06/18/2017: Ost Cx to Prox Cx lesion, 100 %stenosed. Ost LM lesion, 50 %stenosed. Ost LAD lesion, 30 %stenosed. Mid RCA lesion, 95 %stenosed. SVG. Origin lesion, 100 %stenosed. SVG. Origin to Prox Graft lesion, 99 %stenosed. Dist Graft to Insertion lesion, 60 %stenosed. 1st Mrg lesion, 99 %stenosed. LIMA. Dist Graft lesion, 75 %stenosed.   1. Severe  three-vessel coronary artery disease with heavily calcified, eccentric 50-60% stenosis distal left main, occluded proximal left circumflex, subtotal mid RCA. 2. Patent LIMA to mid LAD with discrete 75% stenosis near anastomotic site 3. Occluded SVG to PDA 4. Patent SVG to OM1 with a long diffuse 95% stenosis proximal segment of graft, with near 60-70% stenosis at the anastomotic site, 95% stenosis in the native vessel distal to the anastomotic site, with retrograde filling of left circumflex 5. Mildly reduced left ventricular function, with inferior-posterior wall akinesis   Recommendations   1. Transfer to [facility] for high risk, technically challenging PCI versus redo CABG __________   Intraoperative TEE 04/10/2017:  Left ventricle: Normal cavity size, wall thickness, left ventricular diastolic function and left atrial pressure. LV systolic function is low normal with an EF of 50-55%. There are no obvious wall motion abnormalities. No thrombus present. No mass present.  Mitral valve: Mild regurgitation.  Right ventricle: Normal cavity size, wall thickness and ejection fraction.  Tricuspid valve: Mild regurgitation.     EKG:  EKG is ordered today.  The EKG ordered today demonstrates NSR, 60 bpm, nonspecific inferior ST-T changes with lateral T wave inversion consistent with prior tracings  Recent Labs: 04/18/2023: ALT 18 04/20/2023: Magnesium 1.5 08/23/2023: Platelets 189 08/27/2023: BUN 13; Creatinine, Ser 1.23 08/31/2023: Hemoglobin 10.5; Potassium 5.1; Sodium 141  Recent Lipid Panel    Component Value Date/Time   CHOL 91 06/18/2017 2337   TRIG 198 (H) 06/18/2017 2337   HDL  22 (L) 06/18/2017 2337   CHOLHDL 4.1 06/18/2017 2337   VLDL 40 06/18/2017 2337   LDLCALC 29 06/18/2017 2337    PHYSICAL EXAM:    VS:  BP (!) 138/56 (BP Location: Left Arm, Patient Position: Sitting, Cuff Size: Normal)   Pulse 60   Ht 6' (1.829 m)   Wt 188 lb 6.4 oz (85.5 kg)   SpO2 100%   BMI 25.55 kg/m    BMI: Body mass index is 25.55 kg/m.  Physical Exam Vitals reviewed.  Constitutional:      Appearance: He is well-developed.  HENT:     Head: Normocephalic and atraumatic.  Eyes:     General:        Right eye: No discharge.        Left eye: No discharge.  Neck:     Vascular: No JVD.  Cardiovascular:     Rate and Rhythm: Normal rate and regular rhythm.     Heart sounds: Normal heart sounds, S1 normal and S2 normal. Heart sounds not distant. No midsystolic click and no opening snap. No murmur heard.    No friction rub.  Pulmonary:     Effort: Pulmonary effort is normal. No respiratory distress.     Breath sounds: Normal breath sounds. No decreased breath sounds, wheezing, rhonchi or rales.  Chest:     Chest wall: No tenderness.  Abdominal:     General: There is no distension.  Musculoskeletal:     Cervical back: Normal range of motion.     Right lower leg: No edema.     Left lower leg: No edema.  Skin:    General: Skin is warm and dry.     Nails: There is no clubbing.  Neurological:     Mental Status: He is alert and oriented to person, place, and time.  Psychiatric:        Speech: Speech normal.        Behavior: Behavior normal.        Thought Content: Thought content normal.        Judgment: Judgment normal.     Wt Readings from Last 3 Encounters:  09/11/23 188 lb 6.4 oz (85.5 kg)  08/31/23 187 lb 11.2 oz (85.1 kg)  08/23/23 186 lb 3.2 oz (84.5 kg)     ASSESSMENT & PLAN:   CAD status post CABG status post PCI without angina: He is without symptoms of angina or cardiac decompensation.  Recent cardiac cath with medical management as outlined above.  No indication for PCI given in the LCx and RCA received collaterals from LAD territory.  Continue aggressive risk factor modification and secondary prevention including aspirin, atorvastatin, carvedilol, and clopidogrel.  Post cath instructions.  HFrEF secondary to ICM: Euvolemic and well compensated with NYHA class II  symptoms.  Further escalate GDMT with addition of Farxiga with a follow-up BMP in 1 week.  Continue current dose of Entresto 24/26 mg twice daily and carvedilol 12.5 mg twice daily.  Defer addition of MRA at this time history of potassium higher side.  Follow-up echo in 2 to 3 months maximally tolerated GDMT to evaluate for improvement in cardiomyopathy.  HTN: Blood pressure is reasonably controlled in the office today.  Continue medical therapy as outlined above.  HLD: LDL 28 in 06/2022.  Check CMP and lipid panel.  Remains on atorvastatin 80 mg.  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 Remains on DAPT and atorvastatin. Followed by vascular surgery and podiatry.     Disposition: F/u with Dr. Mariah Milling or an APP in 1 month.   Medication Adjustments/Labs and Tests Ordered: Current medicines are reviewed at length with the patient today.  Concerns regarding medicines are outlined above. Medication changes, Labs and Tests ordered today are summarized above and listed in the Patient Instructions accessible in Encounters.   Signed, Eula Listen, PA-C 09/11/2023 9:35 AM     [facility] [address] [place], [place] [address] [phone]

## 2023-09-11 ENCOUNTER — Encounter: Payer: Self-pay | Admitting: Oncology

## 2023-09-11 ENCOUNTER — Ambulatory Visit: Payer: Medicare HMO | Attending: Physician Assistant | Admitting: Physician Assistant

## 2023-09-11 ENCOUNTER — Encounter: Payer: Self-pay | Admitting: Physician Assistant

## 2023-09-11 ENCOUNTER — Telehealth: Payer: Self-pay | Admitting: Physician Assistant

## 2023-09-11 VITALS — BP 138/56 | HR 60 | Ht 72.0 in | Wt 188.4 lb

## 2023-09-11 DIAGNOSIS — I25810 Atherosclerosis of coronary artery bypass graft(s) without angina pectoris: Secondary | ICD-10-CM | POA: Diagnosis not present

## 2023-09-11 DIAGNOSIS — I10 Essential (primary) hypertension: Secondary | ICD-10-CM

## 2023-09-11 DIAGNOSIS — I739 Peripheral vascular disease, unspecified: Secondary | ICD-10-CM

## 2023-09-11 DIAGNOSIS — Z79899 Other long term (current) drug therapy: Secondary | ICD-10-CM

## 2023-09-11 DIAGNOSIS — I255 Ischemic cardiomyopathy: Secondary | ICD-10-CM | POA: Diagnosis not present

## 2023-09-11 DIAGNOSIS — I5020 Unspecified systolic (congestive) heart failure: Secondary | ICD-10-CM

## 2023-09-11 DIAGNOSIS — Z951 Presence of aortocoronary bypass graft: Secondary | ICD-10-CM

## 2023-09-11 DIAGNOSIS — E785 Hyperlipidemia, unspecified: Secondary | ICD-10-CM

## 2023-09-11 MED ORDER — DAPAGLIFLOZIN PROPANEDIOL 10 MG PO TABS: 10.0000 mg | ORAL_TABLET | Freq: Every day | ORAL | 3 refills | Status: DC

## 2023-09-11 MED ORDER — DAPAGLIFLOZIN PROPANEDIOL 10 MG PO TABS: 10.0000 mg | ORAL_TABLET | Freq: Every day | ORAL | 0 refills | Status: DC

## 2023-09-11 NOTE — Telephone Encounter (Signed)
Pt c/o medication issue:  1. Name of Medication:   dapagliflozin propanediol (FARXIGA) 10 MG TABS tablet Take 1 tablet (10 mg total) by mouth daily before breakfast.    dapagliflozin propanediol (FARXIGA) 10 MG TABS tablet Take 1 tablet (10 mg total) by mouth daily before breakfast.    2. How are you currently taking this medication (dosage and times per day)? As written   3. Are you having a reaction (difficulty breathing--STAT)? No   4. What is your medication issue? Pt called in stating his insurance will not cover this medication and it would around $200. Please advise.

## 2023-09-11 NOTE — Patient Instructions (Signed)
Medication Instructions:  Your physician recommends the following medication changes.  START TAKING: Farxiga 10 mg daily  *If you need a refill on your cardiac medications before your next appointment, please call your pharmacy*   Lab Work: Your provider would like for you to have following labs drawn today CMT and Lipid panel.   Your provider would like for you to return in 1 week after starting FARXIGA to have the following labs drawn: BMT.   Please go to [facility] [address] ([facility], [place] [address] You do not need an appointment.  They are open from 7:30 am-4 pm.  Lunch from 1:00 pm- 2:00 pm You not need to be fasting.   You may also go to any of these [facility] locations:  [employer]  - [address] [employer] [address] Chief Technology Officer)   If you have labs (blood work) drawn today and your tests are completely normal, you will receive your results only by: [employer] (if you have MyChart) OR A paper copy in the mail If you have any lab test that is abnormal or we need to change your treatment, we will call you to review the results.  Follow-Up: At [facility] [place] [facility], you and your health needs are our priority.  As part of our continuing mission to provide you with exceptional heart care, we have created designated Provider Care Teams.  These Care Teams include your primary Cardiologist (physician) and Advanced Practice Providers (APPs -  Physician Assistants and Nurse Practitioners) who all work together to provide you with the care you need, when you need it.  We recommend signing up for the patient portal called "MyChart".  Sign up information is provided on this After Visit Summary.  MyChart is used to connect with patients for Virtual Visits (Telemedicine).  Patients are able to view lab/test results, encounter notes, upcoming appointments, etc.  Non-urgent messages can be sent to your provider as well.   To learn more  about what you can do with MyChart, go to [email or web address].    Your next appointment:   1 month(s)  Provider:   You may see Julien Nordmann, MD or one of the following Advanced Practice Providers on your designated Care Team:   Eula Listen, [place]

## 2023-09-11 NOTE — Telephone Encounter (Signed)
Please check to see if Kenneth Duarte is covered by his insurance. If not, can we complete patient assistance paperwork?

## 2023-09-12 LAB — COMPREHENSIVE METABOLIC PANEL
ALT: 11 IU/L (ref 0–44)
AST: 13 IU/L (ref 0–40)
Albumin: 4 g/dL (ref 3.8–4.8)
Alkaline Phosphatase: 100 IU/L (ref 44–121)
BUN/Creatinine Ratio: 13 (ref 10–24)
BUN: 15 mg/dL (ref 8–27)
Bilirubin Total: 0.4 mg/dL (ref 0.0–1.2)
CO2: 21 mmol/L (ref 20–29)
Calcium: 8.7 mg/dL (ref 8.6–10.2)
Chloride: 103 mmol/L (ref 96–106)
Creatinine, Ser: 1.14 mg/dL (ref 0.76–1.27)
Globulin, Total: 2.7 g/dL (ref 1.5–4.5)
Glucose: 155 mg/dL — ABNORMAL HIGH (ref 70–99)
Potassium: 4.4 mmol/L (ref 3.5–5.2)
Sodium: 140 mmol/L (ref 134–144)
Total Protein: 6.7 g/dL (ref 6.0–8.5)
eGFR: 67 mL/min/{1.73_m2} (ref >59)

## 2023-09-12 LAB — LIPID PANEL
Chol/HDL Ratio: 3.5 ratio (ref 0.0–5.0)
Cholesterol, Total: 92 mg/dL — ABNORMAL LOW (ref 100–199)
HDL: 26 mg/dL — ABNORMAL LOW (ref >39)
LDL Chol Calc (NIH): 31 mg/dL (ref 0–99)
Triglycerides: 221 mg/dL — ABNORMAL HIGH (ref 0–149)
VLDL Cholesterol Cal: 35 mg/dL (ref 5–40)

## 2023-09-13 ENCOUNTER — Other Ambulatory Visit (HOSPITAL_COMMUNITY): Payer: Self-pay

## 2023-09-21 NOTE — Telephone Encounter (Signed)
Spoke with patient and he has not applied for assistance but is interested in trying for his medication because he can not afford it. Reviewed application for assistance and now it is pending his signature. He reports that he will come by the office today to get that done. He had no further questions at this time.

## 2023-09-25 ENCOUNTER — Ambulatory Visit (INDEPENDENT_AMBULATORY_CARE_PROVIDER_SITE_OTHER): Payer: Medicare HMO

## 2023-09-25 ENCOUNTER — Ambulatory Visit (INDEPENDENT_AMBULATORY_CARE_PROVIDER_SITE_OTHER): Payer: Medicare HMO | Admitting: Vascular Surgery

## 2023-09-25 DIAGNOSIS — I739 Peripheral vascular disease, unspecified: Secondary | ICD-10-CM

## 2023-09-25 DIAGNOSIS — Z9889 Other specified postprocedural states: Secondary | ICD-10-CM

## 2023-09-26 LAB — VAS US ABI WITH/WO TBI
Left ABI: >1
Right ABI: >1

## 2023-09-27 NOTE — Telephone Encounter (Signed)
Patient was approved for his Marcelline Deist until 12/17/2024  Company has updated patient via letter.

## 2023-10-15 NOTE — Progress Notes (Unsigned)
Cardiology Office Note    Date:  10/16/2023   ID:  Kenneth Duarte, DOB 1946-10-22, MRN [id number]  PCP:  Emogene Morgan, MD  Cardiologist:  Julien Nordmann, MD  Electrophysiologist:  None   Chief Complaint: Follow up  History of Present Illness:   Kenneth Duarte is a 77 y.o. 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   He was previously followed by [facility] cardiology.  LHC in 02/2017 showed multivessel CAD with the patient ultimately undergoing three-vessel bypass in 03/2017 as outlined above.  Intraoperative TEE showed an EF of 50 to 55%.  He underwent repeat cath in 06/2017 which showed severe native vessel CAD with patent LIMA to LAD with discrete 75% stenosis near the anastomotic site, occluded SVG to PDA, and patent SVG to OM1 with a long diffuse 95% stenosis in the proximal segment of the graft with near 60 to 70% stenosis at the anastomotic site.  There was 25% stenosis in the native vessel distal to the anastomotic site with retrograde filling of the LCx.  He was transferred to [facility] where he underwent repeat LHC with PCI/DES to the SVG to OM.  There was distal graft 75% stenosis with more disease noted past the graft insertion in the OM and distal LCx.     He was admitted to the hospital 03/2023 and 04/2023 with osteomyelitis involving the fifth metatarsal of the right foot status post stenting  to the right SFA along with angioplasty of the mid to distal SFA, popliteal artery, and right anterior tibial artery proximally and distally in 03/2023 along with partial ray amputation.  He was readmitted with nonhealing wound and underwent remaining fifth metatarsal bone amputation in 04/2023.   He was seen in the office in 04/2023 and was without symptoms of angina or cardiac decompensation.  No cardiac changes were indicated at that time.  With new lateral ST-T changes on EKG, he underwent echo in 06/2023 that showed an EF of 35 to 40%, global hypokinesis, severely dilated LV internal cavity size, grade 1 diastolic dysfunction, normal RV systolic function and ventricular cavity size, mildly dilated left atrium, mild to moderate mitral regurgitation, and an estimated right atrial pressure of 15 mmHg.   He was seen at outside ER on 07/13/2023 after sustaining a mechanical fall slipping on a wet ramp leading to rib fractures in ribs 2 through 6.  X-ray was also remarkable for a small right-sided pneumothorax that persisted on repeat chest x-ray with concern for increased fluid component as well.  Patient declined hospital admission and left AMA.   He was seen in the office on 08/23/2023 and remained without symptoms of angina or cardiac decompensation.  Given new cardiomyopathy, and EKG changes, he underwent R/LHC on 08/31/2023 showed significant underlying three-vessel CAD with chronically occluded ostial LCx and chronic subtotal occlusion of the mid RCA.  SVG to rPDA was chronically occluded.  SVG to OM, which was stented previously, was now chronically occluded at the ostium.  LIMA to LAD was patent with no significant anastomosis stenosis.  The distal LAD provided collaterals to the rPDA and a diagonal branch provided collaterals to the OM's.  RHC showed normal filling pressures, minimal pulmonary pretension, and normal cardiac output.  There were no PCI options with both RCA and LCx receiving collaterals from  the LAD.  Medical therapy was recommended.  He was most recently seen in the office on 09/07/2023 and was doing well.  He was started on Farxiga with continuation of Entresto and carvedilol.  He comes in doing well from a cardiac perspective and is without symptoms of angina or cardiac decompensation.  No dyspnea, lower extremity swelling, abdominal distention, orthopnea, PND, or early satiety.  No falls or symptoms concerning for bleeding.  Adherent and tolerating cardiac medications.  Now taking metformin once daily twice daily weekly initiation of Farxiga 10 days ago.  Blood pressure is elevated in the office today, though he has been up since 2 AM having to drive to [facility] to pick up some collard greens.   Labs independently reviewed: 08/2023 - TC 92, TG 221, HDL 26, LDL 31, BUN 15, serum creatinine 1.14, potassium 4.4, albumin 4.4, AST/ALT normal, Hgb 10.9, PLT 189 04/2023 - magnesium 1.5 03/2023 - A1c 7.0 06/2018 - TSH normal   Past Medical History:  Diagnosis Date   Arthritis    Chronic pain syndrome    Congestive heart failure (HCC) 1980   Coronary artery disease    a. s/p 3V CABG (03/2017). b. NSTEMI 06/2017 with progressive graft disease, s/p DES to SVG-OM.   Diabetes (HCC)    DIET   Dyspnea    with exertion   ED (erectile dysfunction)    GERD (gastroesophageal reflux disease)    Gout    Headache    Hyperlipemia    Hypertension    CONTROLLED ON MEDS   IDA (iron deficiency anemia) 10/21/2019   Neuropathy of both feet    Seborrheic keratosis    Sinus congestion    Vertigo    Wears dentures    PARTIAL UPPER    Past Surgical History:  Procedure Laterality Date   COLONOSCOPY WITH PROPOFOL N/A 10/25/2015   Procedure: COLONOSCOPY WITH PROPOFOL;  Surgeon: Midge Minium, MD;  Location: [facility];  Service: Endoscopy;  Laterality: N/A;  DIABETIC-ORAL MEDS   COLONOSCOPY WITH PROPOFOL N/A 10/22/2020   Procedure: COLONOSCOPY CANCELED;  Surgeon: Midge Minium, MD;  Location:  [facility];  Service: Endoscopy;  Laterality: N/A;  procedure aborted poor prep   COLONOSCOPY WITH PROPOFOL N/A 10/25/2020   Procedure: COLONOSCOPY WITH PROPOFOL;  Surgeon: Midge Minium, MD;  Location: [facility];  Service: Endoscopy;  Laterality: N/A;   CORONARY ARTERY BYPASS GRAFT N/A 04/10/2017   Procedure: CORONARY ARTERY BYPASS GRAFTING (CABG) x three , using left internal mammary artery and right leg greater saphenous vein harvested endoscopically;  Surgeon: Kerin Perna, MD;  Location: [facility] OR;  Service: Open Heart Surgery;  Laterality: N/A;   CORONARY ARTERY BYPASS GRAFT     CORONARY STENT INTERVENTION N/A 06/19/2017   Procedure: Coronary Stent Intervention;  Surgeon: Corky Crafts, MD;  Location: [facility] INVASIVE CV LAB;  Service: Cardiovascular;  Laterality: N/A;   ESOPHAGOGASTRODUODENOSCOPY (EGD) WITH PROPOFOL N/A 10/22/2020   Procedure: ESOPHAGOGASTRODUODENOSCOPY (EGD) WITH PROPOFOL;  Surgeon: Midge Minium, MD;  Location: [facility];  Service: Endoscopy;  Laterality: N/A;  Diabetic -  oral meds   EXCISION PARTIAL PHALANX Right 03/22/2023   Procedure: EXCISION  PHALANX;  Surgeon: Rosetta Posner, DPM;  Location: [facility] ORS;  Service: Podiatry;  Laterality: Right;   INCISION AND DRAINAGE Right 04/19/2023   Procedure: INCISION AND DRAINAGE;  Surgeon: Linus Galas, DPM;  Location: [facility] ORS;  Service: Podiatry;  Laterality: Right;   IRRIGATION AND DEBRIDEMENT FOOT Right 03/22/2023   Procedure: IRRIGATION AND DEBRIDEMENT FOOT;  Surgeon: Rosetta Posner, DPM;  Location: [facility] ORS;  Service: Podiatry;  Laterality: Right;   KNEE SURGERY Right    over 20 years ago   LEFT HEART CATH AND CORONARY ANGIOGRAPHY Left 02/22/2017   Procedure: Left Heart Cath and Coronary Angiography;  Surgeon: Lamar Blinks, MD;  Location: [facility] INVASIVE CV LAB;  Service: Cardiovascular;  Laterality: Left;   LEFT HEART CATH AND CORS/GRAFTS ANGIOGRAPHY N/A 06/18/2017   Procedure: Left Heart Cath and Cors/Grafts  Angiography and PCI;  Surgeon: Marcina Millard, MD;  Location: [facility] INVASIVE CV LAB;  Service: Cardiovascular;  Laterality: N/A;   LOWER EXTREMITY ANGIOGRAPHY Right 03/23/2023   Procedure: Lower Extremity Angiography;  Surgeon: Annice Needy, MD;  Location: [facility] INVASIVE CV LAB;  Service: Cardiovascular;  Laterality: Right;   LOWER EXTREMITY INTERVENTION  03/23/2023   Procedure: LOWER EXTREMITY INTERVENTION;  Surgeon: Annice Needy, MD;  Location: [facility] INVASIVE CV LAB;  Service: Cardiovascular;;   POLYPECTOMY  10/25/2015   Procedure: POLYPECTOMY;  Surgeon: Midge Minium, MD;  Location: [facility] SURGERY [facility]TR;  Service: Endoscopy;;   RIGHT/LEFT HEART CATH AND CORONARY/GRAFT ANGIOGRAPHY Bilateral 08/31/2023   Procedure: RIGHT/LEFT HEART CATH AND CORONARY/GRAFT ANGIOGRAPHY;  Surgeon: Iran Ouch, MD;  Location: [facility] INVASIVE CV LAB;  Service: Cardiovascular;  Laterality: Bilateral;   ROTATOR CUFF REPAIR Right 2012   TEE WITHOUT CARDIOVERSION N/A 04/10/2017   Procedure: TRANSESOPHAGEAL ECHOCARDIOGRAM (TEE);  Surgeon: Kerin Perna, MD;  Location: [facility] OR;  Service: Open Heart Surgery;  Laterality: N/A;    Current Medications: Current Meds  Medication Sig   aspirin 81 MG tablet Take 1 tablet (81 mg total) by mouth daily.   atorvastatin (LIPITOR) 80 MG tablet TAKE 1 TABLET AT BEDTIME   carvedilol (COREG) 12.5 MG tablet TAKE 1 TABLET TWICE DAILY WITH MEALS   clopidogrel (PLAVIX) 75 MG tablet TAKE 1 TABLET EVERY DAY   dapagliflozin propanediol (FARXIGA) 10 MG TABS tablet Take 1 tablet (10 mg total) by mouth daily before breakfast.   docusate sodium (COLACE) 100 MG capsule Take 100 mg by mouth 2 (two) times daily as needed. prn   FEROSUL 325 (65 Fe) MG tablet TAKE 1 TABLET EVERY DAY   furosemide (LASIX) 20 MG tablet TAKE 1 TABLET EVERY DAY   gabapentin (NEURONTIN) 800 MG tablet Take 800 mg by mouth daily.   meclizine (ANTIVERT) 25 MG tablet TAKE 1 TABLET TWICE DAILY AS NEEDED FOR DIZZINESS   metFORMIN  (GLUCOPHAGE-XR) 500 MG 24 hr tablet Take 500 mg by mouth daily.   nitroGLYCERIN (NITROSTAT) 0.4 MG SL tablet Place 1 tablet (0.4 mg total) under the tongue every 5 (five) minutes as needed for chest pain.   oxyCODONE-acetaminophen (PERCOCET) 10-325 MG tablet Take 2 tablets by mouth every 6 (six) hours as needed for pain.   pantoprazole (PROTONIX) 40 MG tablet TAKE 1 TABLET EVERY DAY   sacubitril-valsartan (ENTRESTO) 49-51 MG Take 1 tablet by mouth 2 (two) times daily.   valACYclovir (VALTREX) 1000 MG tablet Take 1,000 mg by mouth as needed.   vitamin B-12 (CYANOCOBALAMIN) 1000 MCG tablet  Take 1,000 mcg by mouth daily.   VITAMIN D-1000 MAX ST 25 MCG (1000 UT) tablet Take 1,000 Units by mouth daily.   [DISCONTINUED] sacubitril-valsartan (ENTRESTO) 24-26 MG Take 1 tablet by mouth 2 (two) times daily.    Allergies:   Blood-group specific substance and Cortisone acetate [cortisone]   Social History   Socioeconomic History   Marital status: Married    Spouse name: Not on file   Number of children: Not on file   Years of education: Not on file   Highest education level: Not on file  Occupational History   Not on file  Tobacco Use   Smoking status: Former    Current packs/day: 0.00    Average packs/day: 2.0 packs/day for 15.0 years (30.0 ttl pk-yrs)    Types: Cigarettes    Start date: 06/29/1964    Quit date: 06/30/1979    Years since quitting: 44.3   Smokeless tobacco: Never  Vaping Use   Vaping status: Never Used  Substance and Sexual Activity   Alcohol use: No    Alcohol/week: 0.0 standard drinks of alcohol    Comment: QUIT IN 1980   Drug use: No   Sexual activity: Yes    Partners: Male    Birth control/protection: Other-see comments  Other Topics Concern   Not on file  Social History Narrative   Not on file   Social Determinants of Health   Financial Resource Strain: Not on file  Food Insecurity: No Food Insecurity (04/18/2023)   Hunger Vital Sign    Worried About Running  Out of Food in the Last Year: Never true    Ran Out of Food in the Last Year: Never true  Transportation Needs: No Transportation Needs (04/18/2023)   PRAPARE - Administrator, Civil Service (Medical): No    Lack of Transportation (Non-Medical): No  Physical Activity: Not on file  Stress: Not on file  Social Connections: Not on file     Family History:  The patient's family history includes Cancer in his father and mother; Diabetes in his brother and sister; Heart attack in his father; Heart disease in his brother and father; Prostate cancer in his brother. There is no history of Bladder Cancer or Kidney disease.  ROS:   12-point review of systems is negative unless otherwise noted in the HPI.   EKGs/Labs/Other Studies Reviewed:    Studies reviewed were summarized above. The additional studies were reviewed today:  [facility] 08/31/2023:   Ost LAD lesion is 30% stenosed.   Ost Cx to Prox Cx lesion is 100% stenosed.   Mid Cx lesion is 80% stenosed.   Origin lesion is 100% stenosed.   1st Mrg lesion is 99% stenosed.   Prox RCA to Mid RCA lesion is 99% stenosed.   Origin to Prox Graft lesion is 100% stenosed.   Prox LAD to Mid LAD lesion is 80% stenosed.   Dist LM to Ost LAD lesion is 50% stenosed.   SVG.   LIMA and is normal in caliber.   1 significant underlying three-vessel coronary artery disease with chronically occluded ostial left circumflex and chronic subtotal occlusion of the mid right coronary artery.  SVG to right PDA is chronically occluded.  SVG to OM which was stented before is now chronically occluded at the ostium.  LIMA to LAD is patent with no significant anastomosis stenosis.  Distal LAD provides collaterals to the right PDA and the diagonal branch provides collaterals to OMs. 2.  Left  ventricular angiography was not performed.  EF was moderately reduced by echo. 3.  Right heart catheterization showed normal filling pressures, minimal pulmonary hypertension  and normal cardiac output.   RA: 9 mmHg RV: 33/4 with an EDP of 9 mmHg PW: 10 mmHg PA: 35/13 with a mean of 21 mmHg Cardiac output is 5.64 with an index of 2.72.   Recommendations: Recommend medical therapy for coronary artery disease.  No PCI options. Both RCA and left circumflex get collaterals from the LAD. __________   2D echo 06/29/2023: 1. Left ventricular ejection fraction, by estimation, is 35 to 40%. The  left ventricle has moderately decreased function. The left ventricle  demonstrates global hypokinesis. The left ventricular internal cavity size  was severely dilated. Left  ventricular diastolic parameters are consistent with Grade I diastolic  dysfunction (impaired relaxation).   2. Right ventricular systolic function is normal. The right ventricular  size is normal.   3. Left atrial size was mildly dilated.   4. The mitral valve is normal in structure. Mild to moderate mitral valve  regurgitation.   5. The aortic valve is tricuspid. Aortic valve regurgitation is not  visualized.   6. The inferior vena cava is dilated in size with <50% respiratory  variability, suggesting right atrial pressure of 15 mmHg.  __________   LHC 06/19/2017: Origin to Prox Graft lesion, 99 %stenosed, in the SVG to OM. A STENT PROMUS PREM MR [id number] drug eluting stent was successfully placed. IC Adenosine given prior to angioplasty. No distal protection used since it is a recently placed graft. Post intervention, there is a 0% residual stenosis. Dist Graft lesion, 75 %stenosed. There is more disease noted past the graft insertion in the OM and in the distal circumflex.   Continue dual antiplatelet therapy along with aggressive secondary prevention. __________   LHC 06/18/2017: Ost Cx to Prox Cx lesion, 100 %stenosed. Ost LM lesion, 50 %stenosed. Ost LAD lesion, 30 %stenosed. Mid RCA lesion, 95 %stenosed. SVG. Origin lesion, 100 %stenosed. SVG. Origin to Prox Graft lesion, 99  %stenosed. Dist Graft to Insertion lesion, 60 %stenosed. 1st Mrg lesion, 99 %stenosed. LIMA. Dist Graft lesion, 75 %stenosed.   1. Severe three-vessel coronary artery disease with heavily calcified, eccentric 50-60% stenosis distal left main, occluded proximal left circumflex, subtotal mid RCA. 2. Patent LIMA to mid LAD with discrete 75% stenosis near anastomotic site 3. Occluded SVG to PDA 4. Patent SVG to OM1 with a long diffuse 95% stenosis proximal segment of graft, with near 60-70% stenosis at the anastomotic site, 95% stenosis in the native vessel distal to the anastomotic site, with retrograde filling of left circumflex 5. Mildly reduced left ventricular function, with inferior-posterior wall akinesis   Recommendations   1. Transfer to [facility] for high risk, technically challenging PCI versus redo CABG __________   Intraoperative TEE 04/10/2017:  Left ventricle: Normal cavity size, wall thickness, left ventricular diastolic function and left atrial pressure. LV systolic function is low normal with an EF of 50-55%. There are no obvious wall motion abnormalities. No thrombus present. No mass present.  Mitral valve: Mild regurgitation.  Right ventricle: Normal cavity size, wall thickness and ejection fraction.  Tricuspid valve: Mild regurgitation.     EKG:  EKG is not ordered today.    Recent Labs: 04/20/2023: Magnesium 1.5 08/23/2023: Platelets 189 08/31/2023: Hemoglobin 10.5 09/11/2023: ALT 11; BUN 15; Creatinine, Ser 1.14; Potassium 4.4; Sodium 140  Recent Lipid Panel    Component Value Date/Time   CHOL 92 (L)  09/11/2023 0859   TRIG 221 (H) 09/11/2023 0859   HDL 26 (L) 09/11/2023 0859   CHOLHDL 3.5 09/11/2023 0859   CHOLHDL 4.1 06/18/2017 2337   VLDL 40 06/18/2017 2337   LDLCALC 31 09/11/2023 0859    PHYSICAL EXAM:    VS:  BP (!) 162/75 (BP Location: Left Arm, Patient Position: Sitting, Cuff Size: Normal)   Pulse (!) 56   Ht 6' (1.829 m)   Wt 196 lb 3.2 oz (89 kg)    SpO2 99%   BMI 26.61 kg/m   BMI: Body mass index is 26.61 kg/m.  Physical Exam Vitals reviewed.  Constitutional:      Appearance: He is well-developed.  HENT:     Head: Normocephalic and atraumatic.  Eyes:     General:        Right eye: No discharge.        Left eye: No discharge.  Neck:     Vascular: No JVD.  Cardiovascular:     Rate and Rhythm: Normal rate and regular rhythm.     Pulses:          Posterior tibial pulses are 2+ on the right side and 2+ on the left side.     Heart sounds: Normal heart sounds, S1 normal and S2 normal. Heart sounds not distant. No midsystolic click and no opening snap. No murmur heard.    No friction rub.  Pulmonary:     Effort: Pulmonary effort is normal. No respiratory distress.     Breath sounds: Normal breath sounds. No decreased breath sounds, wheezing, rhonchi or rales.  Chest:     Chest wall: No tenderness.  Abdominal:     General: There is no distension.  Musculoskeletal:     Cervical back: Normal range of motion.     Right lower leg: No edema.     Left lower leg: No edema.  Skin:    General: Skin is warm and dry.     Nails: There is no clubbing.  Neurological:     Mental Status: He is alert and oriented to person, place, and time.  Psychiatric:        Speech: Speech normal.        Behavior: Behavior normal.        Thought Content: Thought content normal.        Judgment: Judgment normal.     Wt Readings from Last 3 Encounters:  10/16/23 196 lb 3.2 oz (89 kg)  09/11/23 188 lb 6.4 oz (85.5 kg)  08/31/23 187 lb 11.2 oz (85.1 kg)     ASSESSMENT & PLAN:   CAD status post CABG status post PCI without angina: He is without symptoms of angina or cardiac decompensation.  Recent cardiac cath with medical management recommended as outlined above.  No indication for PCI given the LCx and RCA received collaterals from LAD territory.  Continue aggressive risk factor modification and secondary prevention including aspirin,  atorvastatin, clopidogrel, and carvedilol.  No indication for further ischemic testing at this time.  HFrEF secondary to ICM: Euvolemic and well compensated with NYHA class II symptoms.  Titrate Entresto to 49/51 mg twice daily (patient will double up current dosage of 24/26 mg twice daily till he runs out of current supply).  Anticipate further titration to 97/103 mg twice daily as able when he is seen in follow-up next month.  Continue carvedilol 12.5 mg twice daily, bradycardia precludes further escalation at this time.  Continue Farxiga 10 mg daily with  a follow-up BMP today.  Defer addition of MRA at this time given titration of Entresto and history of of high normal potassiums.  Schedule limited echo in 2 months to reevaluate cardiomyopathy on maximally tolerated GDMT.  HTN: Blood pressure is elevated in the office today, likely in the setting of him having been up since 2 AM to drive to [facility] to pick up collard greens.  Titrate Entresto as outlined above with continuation of carvedilol.  Low-sodium diet is encouraged.  HLD: LDL 31 in 08/2023 with normal AST/ALT at that time.  Was on atorvastatin 80 mg.  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 Remains on DAPT and atorvastatin. Followed by vascular surgery and podiatry.     Disposition: F/u with Dr. Mariah Milling as scheduled on 11/12/23 with limited echo scheduled in late 11/2023 to reevaluate cardiomyopathy on maximally tolerated GDMT.   Medication Adjustments/Labs and Tests Ordered: Current medicines are reviewed at length with the patient today.  Concerns regarding medicines are outlined above. Medication changes, Labs and Tests ordered today are summarized above and listed in the Patient Instructions accessible in Encounters.   Signed, Eula Listen, PA-C 10/16/2023 10:10 AM     [facility] [address] [place], [place] [address] [phone]

## 2023-10-16 ENCOUNTER — Ambulatory Visit: Payer: Medicare HMO | Attending: Physician Assistant | Admitting: Physician Assistant

## 2023-10-16 ENCOUNTER — Encounter: Payer: Self-pay | Admitting: Physician Assistant

## 2023-10-16 VITALS — BP 162/75 | HR 56 | Ht 72.0 in | Wt 196.2 lb

## 2023-10-16 DIAGNOSIS — I255 Ischemic cardiomyopathy: Secondary | ICD-10-CM

## 2023-10-16 DIAGNOSIS — I10 Essential (primary) hypertension: Secondary | ICD-10-CM

## 2023-10-16 DIAGNOSIS — I25810 Atherosclerosis of coronary artery bypass graft(s) without angina pectoris: Secondary | ICD-10-CM | POA: Diagnosis not present

## 2023-10-16 DIAGNOSIS — I739 Peripheral vascular disease, unspecified: Secondary | ICD-10-CM

## 2023-10-16 DIAGNOSIS — Z951 Presence of aortocoronary bypass graft: Secondary | ICD-10-CM

## 2023-10-16 DIAGNOSIS — E785 Hyperlipidemia, unspecified: Secondary | ICD-10-CM

## 2023-10-16 DIAGNOSIS — I5020 Unspecified systolic (congestive) heart failure: Secondary | ICD-10-CM

## 2023-10-16 MED ORDER — ENTRESTO 49-51 MG PO TABS: 1.0000 | ORAL_TABLET | Freq: Two times a day (BID) | ORAL | Status: DC

## 2023-10-16 NOTE — Patient Instructions (Addendum)
Medication Instructions:  Your physician recommends the following medication changes.  INCREASE: Entresto 49/51 mg twice daily  *If you need a refill on your cardiac medications before your next appointment, please call your pharmacy*   Lab Work: Your provider would like for you to have following labs drawn today BMP.   If you have labs (blood work) drawn today and your tests are completely normal, you will receive your results only by: MyChart Message (if you have MyChart) OR A paper copy in the mail If you have any lab test that is abnormal or we need to change your treatment, we will call you to review the results.   Testing/Procedures: Your physician has requested that you have an LIMITED echocardiogram in late December, 2024. Echocardiography is a painless test that uses sound waves to create images of your heart. It provides your doctor with information about the size and shape of your heart and how well your heart's chambers and valves are working.   You may receive an ultrasound enhancing agent through an IV if needed to better visualize your heart during the echo. This procedure takes approximately one hour.  There are no restrictions for this procedure.  This will take place at [address] [address], [place] [address]    Follow-Up: At [facility], you and your health needs are our priority.  As part of our continuing mission to provide you with exceptional heart care, we have created designated Provider Care Teams.  These Care Teams include your primary Cardiologist (physician) and Advanced Practice Providers (APPs -  Physician Assistants and Nurse Practitioners) who all work together to provide you with the care you need, when you need it.  We recommend signing up for the patient portal called "MyChart".  Sign up information is provided on this After Visit Summary.  MyChart is used to connect with patients for Virtual Visits  (Telemedicine).  Patients are able to view lab/test results, encounter notes, upcoming appointments, etc.  Non-urgent messages can be sent to your provider as well.   To learn more about what you can do with MyChart, go to [email or web address].    Your next appointment:   Will be 11 November, 2024 with Dr Mariah Milling  Provider:   You may see Julien Nordmann, MD or one of the following Advanced Practice Providers on your designated Care Team:   Nicolasa Ducking, NP Eula Listen, PA-C

## 2023-10-17 ENCOUNTER — Telehealth: Payer: Self-pay | Admitting: Cardiovascular Disease

## 2023-10-17 ENCOUNTER — Other Ambulatory Visit: Payer: Self-pay | Admitting: Emergency Medicine

## 2023-10-17 DIAGNOSIS — Z79899 Other long term (current) drug therapy: Secondary | ICD-10-CM

## 2023-10-17 LAB — BASIC METABOLIC PANEL
BUN/Creatinine Ratio: 10 (ref 10–24)
BUN: 14 mg/dL (ref 8–27)
CO2: 25 mmol/L (ref 20–29)
Calcium: 8.8 mg/dL (ref 8.6–10.2)
Chloride: 102 mmol/L (ref 96–106)
Creatinine, Ser: 1.35 mg/dL — ABNORMAL HIGH (ref 0.76–1.27)
Glucose: 134 mg/dL — ABNORMAL HIGH (ref 70–99)
Potassium: 4.7 mmol/L (ref 3.5–5.2)
Sodium: 142 mmol/L (ref 134–144)
eGFR: 55 mL/min/{1.73_m2} — ABNORMAL LOW (ref >59)

## 2023-10-17 NOTE — Telephone Encounter (Signed)
Patient stated he is returning staff call and stated he has not been taking dapagliflozin propanediol (FARXIGA) 10 MG TABS tablet.

## 2023-10-19 NOTE — Telephone Encounter (Signed)
Pt called to confirm that he had NOT filled the medicine yet and therefore couldn't stop it

## 2023-10-25 LAB — BASIC METABOLIC PANEL
BUN/Creatinine Ratio: 10 (ref 10–24)
BUN: 15 mg/dL (ref 8–27)
CO2: 24 mmol/L (ref 20–29)
Calcium: 8.8 mg/dL (ref 8.6–10.2)
Chloride: 106 mmol/L (ref 96–106)
Creatinine, Ser: 1.5 mg/dL — ABNORMAL HIGH (ref 0.76–1.27)
Glucose: 221 mg/dL — ABNORMAL HIGH (ref 70–99)
Potassium: 4.5 mmol/L (ref 3.5–5.2)
Sodium: 142 mmol/L (ref 134–144)
eGFR: 48 mL/min/{1.73_m2} — ABNORMAL LOW (ref >59)

## 2023-10-26 ENCOUNTER — Other Ambulatory Visit: Payer: Self-pay | Admitting: Emergency Medicine

## 2023-11-03 LAB — BASIC METABOLIC PANEL
BUN/Creatinine Ratio: 11 (ref 10–24)
BUN: 15 mg/dL (ref 8–27)
CO2: 23 mmol/L (ref 20–29)
Calcium: 8.8 mg/dL (ref 8.6–10.2)
Chloride: 104 mmol/L (ref 96–106)
Creatinine, Ser: 1.4 mg/dL — ABNORMAL HIGH (ref 0.76–1.27)
Glucose: 203 mg/dL — ABNORMAL HIGH (ref 70–99)
Potassium: 5.2 mmol/L (ref 3.5–5.2)
Sodium: 141 mmol/L (ref 134–144)
eGFR: 52 mL/min/{1.73_m2} — ABNORMAL LOW (ref >59)

## 2023-11-08 ENCOUNTER — Telehealth: Payer: Self-pay

## 2023-11-08 ENCOUNTER — Encounter: Payer: Self-pay | Admitting: Oncology

## 2023-11-08 NOTE — Telephone Encounter (Signed)
I called the patient regarding lab work. The patient stated that roughly 3 days ago, he restarted taking Entresto 24-26 mg BID due to elevated blood pressure. He mentioned that in the morning, his blood pressure would range around 200/80 and 190/70, but it drops in the afternoon to 100/44 and 110/50. He reported feeling very dizzy when it drops.  The patient has an appointment scheduled with Dr. Mariah Milling this Monday, 11/12/23. However, the nurse will forward the message to [facility] for recommendations in the interim.   Sondra Barges, PA-C  Ursula Alert, RN Please inform the patient his glucose is elevated Kidney function mildly elevated, improved from prior Potassium high normal  Recommendations: -Continue to hold Entresto and furosemide, resumption can be discussed at office visit next week -Ensure he is not using seasonings or salt substitutes high in potassium

## 2023-11-09 ENCOUNTER — Other Ambulatory Visit: Payer: Self-pay

## 2023-11-09 ENCOUNTER — Other Ambulatory Visit: Payer: Self-pay | Admitting: Cardiovascular Disease

## 2023-11-09 ENCOUNTER — Encounter: Payer: Self-pay | Admitting: Oncology

## 2023-11-09 MED ORDER — CARVEDILOL 12.5 MG PO TABS: 6.2500 mg | ORAL_TABLET | Freq: Two times a day (BID) | ORAL | Status: DC

## 2023-11-09 NOTE — Telephone Encounter (Signed)
Spoke to patient and relayed information from the provider as follows:  "Covering Kenneth Duarte this week.  Seems to be very responsive to entresto.  Looking at most recent labs, I think it's ok to continue at current dose, but would reduce carvedilol to 1/2 tab (should take 6.25 mg bid) in an effort to prevent such significant drops.  F/u on Monday as planned at which point Dr. Mariah Milling is likely to repeat a BMET."  Patient understood with read back

## 2023-11-09 NOTE — Telephone Encounter (Signed)
Covering Ryan this week.  Seems to be very responsive to entresto.  Looking at most recent labs, I think it's ok to continue at current dose, but would reduce carvedilol to 1/2 tab (should take 6.25 mg bid) in an effort to prevent such significant drops.  F/u on Monday as planned at which point Dr. Mariah Milling is likely to repeat a BMET.

## 2023-11-09 NOTE — Telephone Encounter (Signed)
Will hold refill request for now.  Review during/after MD visit on 11/12/23.  last visit: 10/16/23 with plan to f/u on 11/12/23  next visit:  11/12/23  11/08/23 phone note--patient instructed to decrease Carvedilol to 6.25 bid and f/u with Dr. Mariah Milling on 11/12/23 appt

## 2023-11-11 NOTE — Progress Notes (Unsigned)
Date:  11/12/2023   ID:  Kenneth Duarte, DOB 1946/09/06, MRN [id number]  Patient Location:  [address] [place] [address]   Provider location:   [facility], [place] office  PCP:  Emogene Morgan, MD  Cardiologist:  Fonnie Mu   Chief Complaint  Patient presents with   Hypertension    History of Present Illness:    Kenneth Duarte is a 77 y.o. male  past medical history of Smoker quit 1980 CAD, CABG  in 03/2017.  3V CABG with LIMA to LAD, SVG to OM, and SVG to PDA by Dr. 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 He was readmitted with nonhealing wound and underwent remaining fifth metatarsal bone amputation in 04/2023.   Fall in July 2024, rib fracture, slipped on wet ground  R/LHC on 08/31/2023 showed significant underlying three-vessel CAD with chronically occluded ostial LCx and chronic subtotal occlusion of the mid RCA. SVG to rPDA was chronically occluded. SVG to OM, which was stented previously, was now chronically occluded at the ostium. LIMA to LAD was patent with no significant anastomosis stenosis. The distal LAD provided collaterals to the rPDA and a diagonal branch provided collaterals to the OM's. RHC showed normal filling pressures, minimal  pulmonary pretension, and normal cardiac output. There were no PCI options with both RCA and LCx receiving collaterals from the LAD.   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 3V CAD with 75% occl LIMA--> LAD near anastomotic site, occl SVG--> PDA and 95% occl SVG--> OM1 and 95% occl of native vessel distal to anastomotic site.   Cardiac catheterization July 2018 1. Severe three-vessel coronary artery disease with heavily calcified, eccentric 50-60% stenosis distal left main, occluded proximal left circumflex, subtotal mid RCA. 2. Patent LIMA to mid LAD with discrete 75% stenosis near anastomotic site 3. Occluded SVG to PDA 4. Patent SVG to OM1 with a long diffuse 95% stenosis proximal segment of graft, with  near 60-70% stenosis at the anastomotic site, 95% stenosis in the native vessel distal to the anastomotic site, with retrograde filling of left circumflex 5. Mildly reduced left ventricular function, with inferior-posterior wall akinesis   Transferred to [facility] Had stent placed to ostial vein graft to OM Dist Graft lesion, 75 %stenosed. There is more disease noted  past the graft insertion in the OM and in the distal circumflex.   Past Medical History:  Diagnosis Date   Arthritis    Chronic pain syndrome    Congestive heart failure (HCC) 1980   Coronary artery disease    a. s/p 3V CABG (03/2017). b. NSTEMI 06/2017 with progressive graft disease, s/p DES to SVG-OM.   Diabetes (HCC)    DIET   Dyspnea    with exertion   ED (erectile dysfunction)    GERD (gastroesophageal reflux disease)    Gout    Headache    Hyperlipemia    Hypertension    CONTROLLED ON MEDS   IDA (iron deficiency anemia) 10/21/2019   Neuropathy of both feet    Seborrheic keratosis    Sinus congestion    Vertigo    Wears dentures    PARTIAL UPPER   Past Surgical History:  Procedure Laterality Date   COLONOSCOPY WITH PROPOFOL N/A 10/25/2015   Procedure: COLONOSCOPY WITH PROPOFOL;  Surgeon: Midge Minium, MD;  Location: [facility];  Service: Endoscopy;  Laterality: N/A;  DIABETIC-ORAL MEDS   COLONOSCOPY WITH PROPOFOL N/A 10/22/2020   Procedure: COLONOSCOPY CANCELED;  Surgeon: Midge Minium, MD;  Location: [facility];  Service: Endoscopy;  Laterality: N/A;  procedure aborted poor prep   COLONOSCOPY WITH PROPOFOL N/A 10/25/2020   Procedure: COLONOSCOPY WITH PROPOFOL;  Surgeon: Midge Minium, MD;  Location: [facility];  Service: Endoscopy;  Laterality: N/A;   CORONARY ARTERY BYPASS GRAFT N/A 04/10/2017   Procedure: CORONARY ARTERY BYPASS GRAFTING (CABG) x three , using left internal mammary artery and right leg greater saphenous vein harvested endoscopically;  Surgeon: Kerin Perna, MD;  Location: [facility] OR;  Service: Open Heart Surgery;  Laterality: N/A;   CORONARY ARTERY BYPASS GRAFT     CORONARY STENT INTERVENTION N/A 06/19/2017   Procedure: Coronary Stent Intervention;  Surgeon: Corky Crafts, MD;  Location: [facility] INVASIVE CV LAB;  Service: Cardiovascular;  Laterality: N/A;   ESOPHAGOGASTRODUODENOSCOPY (EGD) WITH PROPOFOL N/A 10/22/2020   Procedure:  ESOPHAGOGASTRODUODENOSCOPY (EGD) WITH PROPOFOL;  Surgeon: Midge Minium, MD;  Location: [facility];  Service: Endoscopy;  Laterality: N/A;  Diabetic - oral meds   EXCISION PARTIAL PHALANX Right 03/22/2023   Procedure: EXCISION  PHALANX;  Surgeon: Rosetta Posner, DPM;  Location: [facility] ORS;  Service: Podiatry;  Laterality: Right;   INCISION AND DRAINAGE Right 04/19/2023   Procedure: INCISION AND DRAINAGE;  Surgeon: Linus Galas, DPM;  Location: [facility] ORS;  Service: Podiatry;  Laterality: Right;   IRRIGATION AND DEBRIDEMENT FOOT Right 03/22/2023   Procedure: IRRIGATION AND DEBRIDEMENT FOOT;  Surgeon: Rosetta Posner, DPM;  Location: [facility] ORS;  Service: Podiatry;  Laterality: Right;   KNEE SURGERY Right    over 20 years ago   LEFT HEART CATH AND CORONARY ANGIOGRAPHY Left 02/22/2017   Procedure: Left Heart Cath and Coronary Angiography;  Surgeon: Lamar Blinks, MD;  Location: [facility] INVASIVE CV LAB;  Service: Cardiovascular;  Laterality: Left;   LEFT HEART CATH AND CORS/GRAFTS ANGIOGRAPHY N/A 06/18/2017   Procedure: Left Heart Cath and Cors/Grafts Angiography and PCI;  Surgeon: Marcina Millard, MD;  Location: [facility]  INVASIVE CV LAB;  Service: Cardiovascular;  Laterality: N/A;   LOWER EXTREMITY ANGIOGRAPHY Right 03/23/2023   Procedure: Lower Extremity Angiography;  Surgeon: Annice Needy, MD;  Location: [facility] INVASIVE CV LAB;  Service: Cardiovascular;  Laterality: Right;   LOWER EXTREMITY INTERVENTION  03/23/2023   Procedure: LOWER EXTREMITY INTERVENTION;  Surgeon: Annice Needy, MD;  Location: [facility] INVASIVE CV LAB;  Service: Cardiovascular;;   POLYPECTOMY  10/25/2015   Procedure: POLYPECTOMY;  Surgeon: Midge Minium, MD;  Location: [facility];  Service: Endoscopy;;   RIGHT/LEFT HEART CATH AND CORONARY/GRAFT ANGIOGRAPHY Bilateral 08/31/2023   Procedure: RIGHT/LEFT HEART CATH AND CORONARY/GRAFT ANGIOGRAPHY;  Surgeon: Iran Ouch, MD;  Location: [facility] INVASIVE CV LAB;  Service: Cardiovascular;   Laterality: Bilateral;   ROTATOR CUFF REPAIR Right 2012   TEE WITHOUT CARDIOVERSION N/A 04/10/2017   Procedure: TRANSESOPHAGEAL ECHOCARDIOGRAM (TEE);  Surgeon: Kerin Perna, MD;  Location: [facility] OR;  Service: Open Heart Surgery;  Laterality: N/A;     Current Meds  Medication Sig   aspirin 81 MG tablet Take 1 tablet (81 mg total) by mouth daily.   atorvastatin (LIPITOR) 80 MG tablet TAKE 1 TABLET AT BEDTIME   carvedilol (COREG) 12.5 MG tablet Take 0.5 tablets (6.25 mg total) by mouth 2 (two) times daily with a meal.   clopidogrel (PLAVIX) 75 MG tablet TAKE 1 TABLET EVERY DAY   dapagliflozin propanediol (FARXIGA) 10 MG TABS tablet Take 10 mg by mouth daily.   gabapentin (NEURONTIN) 800 MG tablet Take 800 mg by mouth daily.   meclizine (ANTIVERT) 25 MG tablet TAKE 1 TABLET TWICE DAILY AS NEEDED FOR DIZZINESS   metFORMIN (GLUCOPHAGE-XR) 500 MG 24 hr tablet Take 500 mg by mouth daily.   nitroGLYCERIN (NITROSTAT) 0.4 MG SL tablet Place 1 tablet (0.4 mg total) under the tongue every 5 (five) minutes as needed for chest pain.   oxyCODONE-acetaminophen (PERCOCET) 10-325 MG tablet Take 2 tablets by mouth every 6 (six) hours as needed for pain.   pantoprazole (PROTONIX) 40 MG tablet TAKE 1 TABLET EVERY DAY   vitamin B-12 (CYANOCOBALAMIN) 1000 MCG tablet Take 1,000 mcg by mouth daily.   VITAMIN D-1000 MAX ST 25 MCG (1000 UT) tablet Take 1,000 Units by mouth daily.     Allergies:   Blood-group specific substance and Cortisone acetate [cortisone]   Social History   Tobacco Use   Smoking status: Former    Current packs/day: 0.00    Average packs/day: 2.0 packs/day for 15.0 years (30.0 ttl pk-yrs)    Types: Cigarettes    Start date: 06/29/1964    Quit date: 06/30/1979    Years since quitting: 44.4   Smokeless tobacco: Never  Vaping Use   Vaping status: Never Used  Substance Use Topics   Alcohol use: No    Alcohol/week: 0.0 standard drinks of alcohol    Comment: QUIT IN 1980   Drug use: No      Family Hx: The patient's family history includes Cancer in his father and mother; Diabetes in his brother and sister; Heart attack in his father; Heart disease in his brother and father; Prostate cancer in his brother. There is no history of Bladder Cancer or Kidney disease.  ROS:   Please see the history of present illness.    Review of Systems  Constitutional: Negative.   HENT: Negative.    Respiratory: Negative.    Cardiovascular: Negative.   Gastrointestinal: Negative.   Musculoskeletal: Negative.   Neurological: Negative.   Psychiatric/Behavioral: Negative.  All other systems reviewed and are negative.    Labs/Other Tests and Data Reviewed:    Recent Labs: 04/20/2023: Magnesium 1.5 08/23/2023: Platelets 189 08/31/2023: Hemoglobin 10.5 09/11/2023: ALT 11 11/02/2023: BUN 15; Creatinine, Ser 1.40; Potassium 5.2; Sodium 141   Recent Lipid Panel Lab Results  Component Value Date/Time   CHOL 92 (L) 09/11/2023 08:59 AM   TRIG 221 (H) 09/11/2023 08:59 AM   HDL 26 (L) 09/11/2023 08:59 AM   CHOLHDL 3.5 09/11/2023 08:59 AM   CHOLHDL 4.1 06/18/2017 11:37 PM   LDLCALC 31 09/11/2023 08:59 AM    Wt Readings from Last 3 Encounters:  11/12/23 195 lb 6 oz (88.6 kg)  10/16/23 196 lb 3.2 oz (89 kg)  09/11/23 188 lb 6.4 oz (85.5 kg)     Exam:    BP (!) 180/60 (BP Location: Left Arm, Patient Position: Sitting, Cuff Size: Normal)   Pulse 67   Ht 6' (1.829 m)   Wt 195 lb 6 oz (88.6 kg)   SpO2 99%   BMI 26.50 kg/m  Constitutional:  oriented to person, place, and time. No distress.  HENT:  Head: Grossly normal Eyes:  no discharge. No scleral icterus.  Neck: No JVD, no carotid bruits  Cardiovascular: Regular rate and rhythm, no murmurs appreciated Pulmonary/Chest: Clear to auscultation bilaterally, no wheezes or rails Abdominal: Soft.  no distension.  no tenderness.  Musculoskeletal: Normal range of motion Neurological:  normal muscle tone. Coordination normal. No  atrophy Skin: Skin warm and dry Psychiatric: normal affect, pleasant   ASSESSMENT & PLAN:    Coronary artery disease of bypass graft of native heart with stable angina pectoris (HCC) - CABG 2018 Recent cardiac catheterization results discussed with him Currently with no symptoms of angina. No further workup at this time. Continue current medication regimen.  Labile hypertension Huge swings in his blood pressure from 90 systolic up to over 200 within short time frames Recommended he take his evening medications at 8:00, morning medications 8:00 Stay hydrated during the daytime to avoid hypotension Stay on carvedilol 6.25 twice daily with Entresto 24/26 twice daily Would avoid checking blood pressure until 2 hours after taking morning medications When blood pressure runs high, if sustained would take hydralazine 25 mg as needed  Type 2 diabetes mellitus with other circulatory complication, without long-term current use of insulin (HCC) A1c well-controlled, managed by primary care on Farxiga, 1 metformin   Dyslipidemia Cholesterol is at goal on the current lipid regimen. No changes to the medications were made.  S/P CABG x 3 -  Prior surgery 2018 Non-smoker, diabetes well controlled, lipids controlled Denies angina  Cardiomyopathy, ischemic Continue carvedilol, Sherryll Burger, Farxiga Given markedly labile hypertension and orthostatic symptoms on higher dose Entresto in the past, no changes made Repeat echo in December  Signed, Julien Nordmann, MD  11/12/2023 8:32 AM    [facility] [address], [place], [place] [address]

## 2023-11-12 ENCOUNTER — Encounter: Payer: Self-pay | Admitting: Cardiovascular Disease

## 2023-11-12 ENCOUNTER — Ambulatory Visit: Payer: Medicare HMO | Attending: Cardiovascular Disease | Admitting: Cardiovascular Disease

## 2023-11-12 VITALS — BP 180/60 | HR 67 | Ht 72.0 in | Wt 195.4 lb

## 2023-11-12 DIAGNOSIS — W19XXXS Unspecified fall, sequela: Secondary | ICD-10-CM

## 2023-11-12 DIAGNOSIS — Z951 Presence of aortocoronary bypass graft: Secondary | ICD-10-CM

## 2023-11-12 DIAGNOSIS — I739 Peripheral vascular disease, unspecified: Secondary | ICD-10-CM

## 2023-11-12 DIAGNOSIS — I255 Ischemic cardiomyopathy: Secondary | ICD-10-CM

## 2023-11-12 DIAGNOSIS — I10 Essential (primary) hypertension: Secondary | ICD-10-CM | POA: Diagnosis not present

## 2023-11-12 DIAGNOSIS — E785 Hyperlipidemia, unspecified: Secondary | ICD-10-CM

## 2023-11-12 DIAGNOSIS — I25810 Atherosclerosis of coronary artery bypass graft(s) without angina pectoris: Secondary | ICD-10-CM | POA: Diagnosis not present

## 2023-11-12 DIAGNOSIS — E782 Mixed hyperlipidemia: Secondary | ICD-10-CM

## 2023-11-12 DIAGNOSIS — W19XXXA Unspecified fall, initial encounter: Secondary | ICD-10-CM

## 2023-11-12 MED ORDER — HYDRALAZINE HCL 25 MG PO TABS: 25.0000 mg | ORAL_TABLET | Freq: Three times a day (TID) | ORAL | 3 refills | Status: DC | PRN

## 2023-11-12 NOTE — Patient Instructions (Addendum)
Medication Instructions:  Take medication 8 AM and 8 PM  Take blood pressure 2 hours after taking medications  Please take hydralazine 25 mg up to three times a day as needed for pressure >160  If you need a refill on your cardiac medications before your next appointment, please call your pharmacy.   Lab work: No new labs needed  Testing/Procedures: No new testing needed  Follow-Up: At [facility], you and your health needs are our priority.  As part of our continuing mission to provide you with exceptional heart care, we have created designated Provider Care Teams.  These Care Teams include your primary Cardiologist (physician) and Advanced Practice Providers (APPs -  Physician Assistants and Nurse Practitioners) who all work together to provide you with the care you need, when you need it.  You will need a follow up appointment in 6 months  Providers on your designated Care Team:   Nicolasa Ducking, NP Eula Listen, PA-C Cadence Fransico Michael, [place]  COVID-19 Vaccine Information can be found at: [email or web address] For questions related to vaccine distribution or appointments, please email [email or web address] or call [phone].

## 2023-11-12 NOTE — Telephone Encounter (Signed)
last visit 11/12/23 with plan to f/u in 6 months.  Next visit: none/active recall

## 2023-11-21 ENCOUNTER — Other Ambulatory Visit: Payer: Self-pay | Admitting: Physician Assistant

## 2023-11-23 ENCOUNTER — Telehealth: Payer: Self-pay | Admitting: *Deleted

## 2023-11-23 NOTE — Telephone Encounter (Signed)
Received signed patient assistance application. Application has been completed and faxed to company

## 2023-11-25 ENCOUNTER — Other Ambulatory Visit: Payer: Self-pay | Admitting: Cardiovascular Disease

## 2023-11-26 NOTE — Telephone Encounter (Signed)
last visit: 11/12/23 with plan to Follow-up in 6 months. Next visit: Active recall

## 2023-12-06 ENCOUNTER — Encounter: Payer: Self-pay | Admitting: Oncology

## 2023-12-10 ENCOUNTER — Ambulatory Visit: Payer: Medicare HMO | Attending: Physician Assistant

## 2023-12-10 ENCOUNTER — Telehealth: Payer: Self-pay | Admitting: Physician Assistant

## 2023-12-10 ENCOUNTER — Encounter: Payer: Self-pay | Admitting: Oncology

## 2023-12-10 DIAGNOSIS — I5020 Unspecified systolic (congestive) heart failure: Secondary | ICD-10-CM | POA: Diagnosis not present

## 2023-12-10 MED ORDER — ENTRESTO 24-26 MG PO TABS: 1.0000 | ORAL_TABLET | Freq: Two times a day (BID) | ORAL | Status: DC

## 2023-12-10 MED ORDER — PERFLUTREN LIPID MICROSPHERE
1.0000 mL | INTRAVENOUS | Status: AC | PRN
Start: 2023-12-10 — End: 2023-12-10
  Administered 2023-12-10: 3 mL via INTRAVENOUS

## 2023-12-10 NOTE — Telephone Encounter (Signed)
Patient calling the office for samples of medication:   1.  What medication and dosage are you requesting samples for? Entresto  2.  Are you currently out of this medication? No only has 5 or 6 left.  He has a patient assistance form. Patient dropped off patient assistance form-placed in [employer].

## 2023-12-10 NOTE — Addendum Note (Signed)
Addended by: Kendrick Fries on: 12/10/2023 09:13 AM   Modules accepted: Orders

## 2023-12-10 NOTE — Telephone Encounter (Signed)
Pt picked up entresto samples today after echocardiogram. No further samples needed at this time.

## 2023-12-10 NOTE — Telephone Encounter (Signed)
Pt called back and medication discussed, appt scheduled at pt's request  Msg routed to [facility] for review

## 2023-12-10 NOTE — Telephone Encounter (Signed)
See previous note

## 2023-12-10 NOTE — Telephone Encounter (Signed)
Patient reported increasing carvedilol from 6.25 mg twice daily to 12.5 mg twice daily because his blood pressures were running "too low."  He is now aware that this change will actually lower his blood pressures further.  Recommendations: -Please have patient resume his previously prescribed carvedilol at 6.25 mg twice daily with hold parameters for systolic blood pressure less than 100 mmHg -Agree with recommended follow-up appointment to readdress blood pressure

## 2023-12-10 NOTE — Telephone Encounter (Signed)
The patient has been called and reports pressures as the following:124/48 (thinks this was last Saturday - but pt doesn't document the date of BPs), other are: 99/35, 98/38, 83/34, 88/38 (perhaps since Saturday).  Pt meds reviewed and pt reports increasing Carvedilol 6.25 mg BID (prescribed) to 12.5 mg because "it was getting too low".  Pt advised of the mechanism of action for medication.  Pt in truck at this time.  Pt will be called back at 1:30 pm to review meds.  Pt denies associated s/sx except "legs weak."  Pt reminded to elevate care if needed and encouraged to drink fluids also to call if any concerns or questions arise

## 2023-12-10 NOTE — Telephone Encounter (Signed)
Pt c/o BP issue: STAT if pt c/o blurred vision, one-sided weakness or slurred speech  1. What are your last 5 BP readings? Patient doesn't have readings with him  2. Are you having any other symptoms (ex. Dizziness, headache, blurred vision, passed out)? no  3. What is your BP issue? Patient states BP is low ranging from 99 to the low 40s

## 2023-12-11 LAB — ECHOCARDIOGRAM LIMITED
Area-P 1/2: 3.27 cm2
S' Lateral: 5.4 cm

## 2023-12-14 ENCOUNTER — Encounter: Payer: Self-pay | Admitting: Emergency Medicine

## 2024-01-01 ENCOUNTER — Ambulatory Visit: Payer: Medicare HMO | Admitting: Physician Assistant

## 2024-01-09 ENCOUNTER — Encounter: Payer: Self-pay | Admitting: Oncology

## 2024-01-09 NOTE — Progress Notes (Signed)
Cardiology Office Note    Date:  01/11/2024   ID:  Kenneth Duarte, DOB December 08, 1946, MRN [id number]  PCP:  Emogene Morgan, MD  Cardiologist:  Julien Nordmann, MD  Electrophysiologist:  None   Chief Complaint: Follow up  History of Present Illness:   Kenneth Duarte is a 78 y.o. 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   He was previously followed by [facility] cardiology.  LHC in 02/2017 showed multivessel CAD with the patient ultimately undergoing three-vessel bypass in 03/2017 as outlined above.  Intraoperative TEE showed an EF of 50 to 55%.  He underwent repeat cath in 06/2017 which showed severe native vessel CAD with patent LIMA to LAD with discrete 75% stenosis near the anastomotic site, occluded SVG to PDA, and patent SVG to OM1 with a long diffuse 95% stenosis in the proximal segment of the graft with near 60 to 70% stenosis at the anastomotic site.  There was 25% stenosis in the native vessel distal to the anastomotic site with retrograde filling of the LCx.  He was transferred to [facility] where he underwent repeat LHC with PCI/DES to the SVG to OM.  There was distal graft 75% stenosis with more disease noted past the graft insertion in the OM and distal LCx.     He was admitted to the hospital 03/2023 and 04/2023 with osteomyelitis involving the fifth metatarsal of the right foot status post stenting to  the right SFA along with angioplasty of the mid to distal SFA, popliteal artery, and right anterior tibial artery proximally and distally in 03/2023 along with partial ray amputation.  He was readmitted with nonhealing wound and underwent remaining fifth metatarsal bone amputation in 04/2023.   He was seen in the office in 04/2023 and was without symptoms of angina or cardiac decompensation.  No cardiac changes were indicated at that time.  With new lateral ST-T changes on EKG, he underwent echo in 06/2023 that showed an EF of 35 to 40%, global hypokinesis, severely dilated LV internal cavity size, grade 1 diastolic dysfunction, normal RV systolic function and ventricular cavity size, mildly dilated left atrium, mild to moderate mitral regurgitation, and an estimated right atrial pressure of 15 mmHg.   He was seen at outside ER on 07/13/2023 after sustaining a mechanical fall slipping on a wet ramp leading to rib fractures in ribs 2 through 6.  X-ray was also remarkable for a small right-sided pneumothorax that persisted on repeat chest x-ray with concern for increased fluid component as well.  Patient declined hospital admission and left AMA.   He was seen in the office on 08/23/2023 and remained without symptoms of angina or cardiac decompensation.  Given new cardiomyopathy, and EKG changes, he underwent R/LHC on 08/31/2023 showed significant underlying three-vessel CAD with chronically occluded ostial LCx and chronic subtotal occlusion of the mid RCA.  SVG to rPDA was chronically occluded.  SVG to OM, which was stented previously, was now chronically occluded at the ostium.  LIMA to LAD was patent with no significant anastomosis stenosis.  The distal LAD provided collaterals to the rPDA and a diagonal branch provided collaterals to the OM's.  RHC showed normal filling pressures, minimal pulmonary pretension, and normal cardiac output.  There were no PCI options with both RCA and LCx receiving collaterals from the  LAD.  Medical therapy was recommended.  He was seen in the office in 10/2023 noting a labile blood pressure with readings ranging from 106 up to 200 systolic.  He reported acute drops in BP of greater than 100 mmHg at times.  Blood pressure was elevated in the office at 180/60, had not taken the medications prior to office visit.  He was not requiring furosemide.  Primary cardiologist recommended the patient take medications at 8 AM and 8 PM with continuation of carvedilol 6.25 mg twice daily and Entresto 24/26 mg twice daily along with as needed hydralazine 25 mg for elevated blood pressure.  Carvedilol was subsequently decreased to 6.25 mg twice daily in the setting of hypotension with BP in the 90s over 40s.  Repeat limited echo on 12/10/2023 showed an EF of 40%, severe hypokinesis of the basal to mid inferior and inferolateral wall, moderately dilated LV internal cavity size, mildly reduced RV systolic function with normal ventricular cavity size, mild mitral regurgitation, and an estimated right atrial pressure of 3 mmHg.  He comes in doing well from a cardiac perspective and is without symptoms of angina or cardiac decompensation.  Since he was last seen, carvedilol was further decreased to 3.125 mg twice daily by PCP.  With this, the patient overall feels better with improvement in dizziness.  No presyncope or syncope.  Blood pressure readings are also improved with most readings in the low 100s to 1 teens systolic with a rare reading of 98/45.  No further elevated BP readings as well.  Remains on Entresto 24/26 mg twice daily along with Farxiga 10 mg.  He has taken as needed hydralazine to once since he was last seen.  No falls or symptoms concerning for bleeding.  No lower extremity swelling or progressive orthopnea.  He is not adding salt to food and is without early satiety.  Weight is stable.   Labs independently reviewed: 10/2023 - BUN 15, serum creatinine 1.4, potassium 5.2 08/2023 - TC 92, TG  221, HDL 26, LDL 31, albumin 4.4, AST/ALT normal, Hgb 10.9, PLT 189 04/2023 - magnesium 1.5 03/2023 - A1c 7.0 06/2018 - TSH normal  Past Medical History:  Diagnosis Date   Arthritis    Chronic pain syndrome    Congestive heart failure (HCC) 1980   Coronary artery disease    a. s/p 3V CABG (03/2017). b. NSTEMI 06/2017 with progressive graft disease, s/p DES to SVG-OM.   Diabetes (HCC)    DIET   Dyspnea    with exertion   ED (erectile dysfunction)    GERD (gastroesophageal reflux disease)    Gout    Headache    Hyperlipemia    Hypertension    CONTROLLED ON MEDS   IDA (iron deficiency anemia) 10/21/2019   Neuropathy of both feet    Seborrheic keratosis    Sinus congestion    Vertigo    Wears dentures    PARTIAL UPPER    Past Surgical History:  Procedure Laterality Date   COLONOSCOPY WITH PROPOFOL N/A 10/25/2015   Procedure: COLONOSCOPY WITH PROPOFOL;  Surgeon: Midge Minium, MD;  Location: [facility];  Service: Endoscopy;  Laterality: N/A;  DIABETIC-ORAL MEDS   COLONOSCOPY WITH PROPOFOL N/A 10/22/2020   Procedure: COLONOSCOPY CANCELED;  Surgeon: Midge Minium, MD;  Location: [facility];  Service: Endoscopy;  Laterality: N/A;  procedure aborted poor prep   COLONOSCOPY WITH PROPOFOL N/A 10/25/2020   Procedure: COLONOSCOPY WITH PROPOFOL;  Surgeon: Midge Minium, MD;  Location: [facility];  Service: Endoscopy;  Laterality: N/A;   CORONARY ARTERY BYPASS GRAFT N/A 04/10/2017   Procedure: CORONARY ARTERY BYPASS GRAFTING (CABG) x three , using left internal mammary artery and right leg greater saphenous vein harvested endoscopically;  Surgeon: Kerin Perna, MD;  Location: [facility] OR;  Service: Open Heart Surgery;  Laterality: N/A;   CORONARY ARTERY BYPASS GRAFT     CORONARY STENT INTERVENTION N/A 06/19/2017   Procedure: Coronary Stent Intervention;  Surgeon: Corky Crafts, MD;  Location: [facility] INVASIVE CV LAB;  Service: Cardiovascular;  Laterality: N/A;    ESOPHAGOGASTRODUODENOSCOPY (EGD) WITH PROPOFOL N/A 10/22/2020   Procedure: ESOPHAGOGASTRODUODENOSCOPY (EGD) WITH PROPOFOL;  Surgeon: Midge Minium, MD;  Location: [facility];  Service: Endoscopy;  Laterality: N/A;  Diabetic - oral meds   EXCISION PARTIAL PHALANX Right 03/22/2023   Procedure: EXCISION  PHALANX;  Surgeon: Rosetta Posner, DPM;  Location: [facility] ORS;  Service: Podiatry;  Laterality: Right;   INCISION AND DRAINAGE Right 04/19/2023   Procedure: INCISION AND DRAINAGE;  Surgeon: Linus Galas, DPM;  Location: [facility] ORS;  Service: Podiatry;  Laterality: Right;   IRRIGATION AND DEBRIDEMENT FOOT Right 03/22/2023   Procedure: IRRIGATION AND DEBRIDEMENT FOOT;  Surgeon: Rosetta Posner, DPM;  Location: [facility] ORS;  Service: Podiatry;  Laterality: Right;   KNEE SURGERY Right    over 20 years ago   LEFT HEART CATH AND CORONARY ANGIOGRAPHY Left 02/22/2017   Procedure: Left Heart Cath and Coronary Angiography;  Surgeon: Lamar Blinks, MD;  Location: [facility] INVASIVE CV LAB;  Service: Cardiovascular;  Laterality: Left;   LEFT HEART CATH AND CORS/GRAFTS ANGIOGRAPHY N/A 06/18/2017   Procedure: Left Heart Cath and Cors/Grafts Angiography and PCI;  Surgeon: Marcina Millard, MD;  Location: [facility] INVASIVE CV LAB;  Service: Cardiovascular;  Laterality: N/A;   LOWER EXTREMITY ANGIOGRAPHY Right 03/23/2023   Procedure: Lower Extremity Angiography;  Surgeon: Annice Needy, MD;  Location: [facility] INVASIVE CV LAB;  Service: Cardiovascular;  Laterality: Right;   LOWER EXTREMITY INTERVENTION  03/23/2023   Procedure: LOWER EXTREMITY INTERVENTION;  Surgeon: Annice Needy, MD;  Location: [facility] INVASIVE CV LAB;  Service: Cardiovascular;;   POLYPECTOMY  10/25/2015   Procedure: POLYPECTOMY;  Surgeon: Midge Minium, MD;  Location: [facility];  Service: Endoscopy;;   RIGHT/LEFT HEART CATH AND CORONARY/GRAFT ANGIOGRAPHY Bilateral 08/31/2023   Procedure: RIGHT/LEFT HEART CATH AND CORONARY/GRAFT ANGIOGRAPHY;  Surgeon: Iran Ouch,  MD;  Location: [facility] INVASIVE CV LAB;  Service: Cardiovascular;  Laterality: Bilateral;   ROTATOR CUFF REPAIR Right 2012   TEE WITHOUT CARDIOVERSION N/A 04/10/2017   Procedure: TRANSESOPHAGEAL ECHOCARDIOGRAM (TEE);  Surgeon: Kerin Perna, MD;  Location: [facility] OR;  Service: Open Heart Surgery;  Laterality: N/A;    Current Medications: Current Meds  Medication Sig   ACCU-CHEK AVIVA PLUS test strip    Accu-Chek Softclix Lancets lancets    Alcohol Swabs (B-D SINGLE USE SWABS REGULAR) PADS    aspirin 81 MG tablet Take 1 tablet (81 mg total) by mouth daily.   atorvastatin (LIPITOR) 80 MG tablet TAKE 1 TABLET AT BEDTIME  carvedilol (COREG) 12.5 MG tablet Take 0.5 tablets (6.25 mg total) by mouth 2 (two) times daily with a meal.   carvedilol (COREG) 3.125 MG tablet Take 3.125 mg by mouth 2 (two) times daily.   clopidogrel (PLAVIX) 75 MG tablet TAKE 1 TABLET EVERY DAY   dapagliflozin propanediol (FARXIGA) 10 MG TABS tablet Take 10 mg by mouth daily.   DENTA 5000 PLUS 1.1 % CREA dental cream SMARTSIG:sparingly By Mouth   ENTRESTO 24-26 MG TAKE 1 TABLET TWICE DAILY   gabapentin (NEURONTIN) 800 MG tablet Take 800 mg by mouth daily.   hydrALAZINE (APRESOLINE) 25 MG tablet Take 1 tablet (25 mg total) by mouth 3 (three) times daily as needed. For blood pressure greater then 160.   meclizine (ANTIVERT) 25 MG tablet TAKE 1 TABLET TWICE DAILY AS NEEDED FOR DIZZINESS   metFORMIN (GLUCOPHAGE-XR) 500 MG 24 hr tablet Take 500 mg by mouth daily.   nitroGLYCERIN (NITROSTAT) 0.4 MG SL tablet Place 1 tablet (0.4 mg total) under the tongue every 5 (five) minutes as needed for chest pain.   oxyCODONE-acetaminophen (PERCOCET) 10-325 MG tablet Take 2 tablets by mouth every 6 (six) hours as needed for pain.   pantoprazole (PROTONIX) 40 MG tablet TAKE 1 TABLET EVERY DAY   sacubitril-valsartan (ENTRESTO) 24-26 MG Take 1 tablet by mouth 2 (two) times daily.   vitamin B-12 (CYANOCOBALAMIN) 1000 MCG tablet Take 1,000 mcg by  mouth daily.   VITAMIN D-1000 MAX ST 25 MCG (1000 UT) tablet Take 1,000 Units by mouth daily.    Allergies:   Blood-group specific substance and Cortisone acetate [cortisone]   Social History   Socioeconomic History   Marital status: Married    Spouse name: Not on file   Number of children: Not on file   Years of education: Not on file   Highest education level: Not on file  Occupational History   Not on file  Tobacco Use   Smoking status: Former    Current packs/day: 0.00    Average packs/day: 2.0 packs/day for 15.0 years (30.0 ttl pk-yrs)    Types: Cigarettes    Start date: 06/29/1964    Quit date: 06/30/1979    Years since quitting: 44.5   Smokeless tobacco: Never  Vaping Use   Vaping status: Never Used  Substance and Sexual Activity   Alcohol use: No    Alcohol/week: 0.0 standard drinks of alcohol    Comment: QUIT IN 1980   Drug use: No   Sexual activity: Yes    Partners: Male    Birth control/protection: Other-see comments  Other Topics Concern   Not on file  Social History Narrative   Not on file   Social Drivers of Health   Financial Resource Strain: Low Risk  (01/10/2024)   Received from [facility]   Overall Financial Resource Strain (CARDIA)    Difficulty of Paying Living Expenses: Not hard at all  Food Insecurity: No Food Insecurity (01/10/2024)   Received from [facility]   Hunger Vital Sign    Worried About Running Out of Food in the Last Year: Never true    Ran Out of Food in the Last Year: Never true  Transportation Needs: No Transportation Needs (01/10/2024)   Received from [facility] - Transportation    In the past 12 months, has lack of transportation kept you from medical appointments or from getting medications?: No    Lack of Transportation (Non-Medical): No  Physical Activity: Not on file  Stress: Not on file  Social Connections: Not on file     Family History:  The  patient's family history includes Cancer in his father and mother; Diabetes in his brother and sister; Heart attack in his father; Heart disease in his brother and father; Prostate cancer in his brother. There is no history of Bladder Cancer or Kidney disease.  ROS:   12-point review of systems is negative unless otherwise noted in the HPI.   EKGs/Labs/Other Studies Reviewed:    Studies reviewed were summarized above. The additional studies were reviewed today:  Limited echo 12/10/2023: 1. Left ventricular ejection fraction, by estimation, is 40%   2. The left ventricle has mildly decreased function. The left ventricle  demonstrates regional wall motion abnormalities (severe hypokinesis of the  basal to mid inferior and inferolateral wall). The left ventricular  internal cavity size was moderately  dilated. Left ventricular diastolic parameters are indeterminate.   3. Right ventricular systolic function is mildly reduced. The right  ventricular size is normal. Tricuspid regurgitation signal is inadequate  for assessing PA pressure.   4. The mitral valve is normal in structure. Mild mitral valve  regurgitation. No evidence of mitral stenosis.   5. The aortic valve is normal in structure. Aortic valve regurgitation is  not visualized. No aortic stenosis is present.   6. The inferior vena cava is normal in size with greater than 50%  respiratory variability, suggesting right atrial pressure of 3 mmHg.   Comparison(s): There is new RWMA-inferior & anterior lateral wall appears  akinetic in both 4CH and 3CH views.  __________  [facility] 08/31/2023:   Ost LAD lesion is 30% stenosed.   Ost Cx to Prox Cx lesion is 100% stenosed.   Mid Cx lesion is 80% stenosed.   Origin lesion is 100% stenosed.   1st Mrg lesion is 99% stenosed.   Prox RCA to Mid RCA lesion is 99% stenosed.   Origin to Prox Graft lesion is 100% stenosed.   Prox LAD to Mid LAD lesion is 80% stenosed.   Dist LM to Ost LAD  lesion is 50% stenosed.   SVG.   LIMA and is normal in caliber.   1 significant underlying three-vessel coronary artery disease with chronically occluded ostial left circumflex and chronic subtotal occlusion of the mid right coronary artery.  SVG to right PDA is chronically occluded.  SVG to OM which was stented before is now chronically occluded at the ostium.  LIMA to LAD is patent with no significant anastomosis stenosis.  Distal LAD provides collaterals to the right PDA and the diagonal branch provides collaterals to OMs. 2.  Left ventricular angiography was not performed.  EF was moderately reduced by echo. 3.  Right heart catheterization showed normal filling pressures, minimal pulmonary hypertension and normal cardiac output.   RA: 9 mmHg RV: 33/4 with an EDP of 9 mmHg PW: 10 mmHg PA: 35/13 with a mean of 21 mmHg Cardiac output is 5.64 with an index of 2.72.   Recommendations: Recommend medical therapy for coronary artery disease.  No PCI options. Both RCA and left circumflex get collaterals from the LAD. __________   2D echo 06/29/2023: 1. Left ventricular ejection fraction, by estimation, is 35 to 40%. The  left ventricle has moderately decreased function. The left ventricle  demonstrates global hypokinesis. The left ventricular internal cavity size  was severely dilated. Left  ventricular diastolic parameters are consistent with Grade I diastolic  dysfunction (impaired  relaxation).   2. Right ventricular systolic function is normal. The right ventricular  size is normal.   3. Left atrial size was mildly dilated.   4. The mitral valve is normal in structure. Mild to moderate mitral valve  regurgitation.   5. The aortic valve is tricuspid. Aortic valve regurgitation is not  visualized.   6. The inferior vena cava is dilated in size with <50% respiratory  variability, suggesting right atrial pressure of 15 mmHg.  __________   LHC 06/19/2017: Origin to Prox Graft lesion, 99  %stenosed, in the SVG to OM. A STENT PROMUS PREM MR 4.0X38 drug eluting stent was successfully placed. IC Adenosine given prior to angioplasty. No distal protection used since it is a recently placed graft. Post intervention, there is a 0% residual stenosis. Dist Graft lesion, 75 %stenosed. There is more disease noted past the graft insertion in the OM and in the distal circumflex.   Continue dual antiplatelet therapy along with aggressive secondary prevention. __________   LHC 06/18/2017: Ost Cx to Prox Cx lesion, 100 %stenosed. Ost LM lesion, 50 %stenosed. Ost LAD lesion, 30 %stenosed. Mid RCA lesion, 95 %stenosed. SVG. Origin lesion, 100 %stenosed. SVG. Origin to Prox Graft lesion, 99 %stenosed. Dist Graft to Insertion lesion, 60 %stenosed. 1st Mrg lesion, 99 %stenosed. LIMA. Dist Graft lesion, 75 %stenosed.   1. Severe three-vessel coronary artery disease with heavily calcified, eccentric 50-60% stenosis distal left main, occluded proximal left circumflex, subtotal mid RCA. 2. Patent LIMA to mid LAD with discrete 75% stenosis near anastomotic site 3. Occluded SVG to PDA 4. Patent SVG to OM1 with a long diffuse 95% stenosis proximal segment of graft, with near 60-70% stenosis at the anastomotic site, 95% stenosis in the native vessel distal to the anastomotic site, with retrograde filling of left circumflex 5. Mildly reduced left ventricular function, with inferior-posterior wall akinesis   Recommendations   1. Transfer to [facility] for high risk, technically challenging PCI versus redo CABG __________   Intraoperative TEE 04/10/2017:  Left ventricle: Normal cavity size, wall thickness, left ventricular diastolic function and left atrial pressure. LV systolic function is low normal with an EF of 50-55%. There are no obvious wall motion abnormalities. No thrombus present. No mass present.  Mitral valve: Mild regurgitation.  Right ventricle: Normal cavity size, wall thickness and  ejection fraction.  Tricuspid valve: Mild regurgitation.     EKG:  EKG is not ordered today.    Recent Labs: 04/20/2023: Magnesium 1.5 08/23/2023: Platelets 189 08/31/2023: Hemoglobin 10.5 09/11/2023: ALT 11 11/02/2023: BUN 15; Creatinine, Ser 1.40; Potassium 5.2; Sodium 141  Recent Lipid Panel    Component Value Date/Time   CHOL 92 (L) 09/11/2023 0859   TRIG 221 (H) 09/11/2023 0859   HDL 26 (L) 09/11/2023 0859   CHOLHDL 3.5 09/11/2023 0859   CHOLHDL 4.1 06/18/2017 2337   VLDL 40 06/18/2017 2337   LDLCALC 31 09/11/2023 0859    PHYSICAL EXAM:    VS:  BP (!) 140/73 (BP Location: Left Arm, Patient Position: Sitting, Cuff Size: Normal)   Pulse 67   Ht 6' (1.829 m)   Wt 197 lb (89.4 kg)   SpO2 98%   BMI 26.72 kg/m   BMI: Body mass index is 26.72 kg/m.  Physical Exam Constitutional:      Appearance: He is well-developed.  HENT:     Head: Normocephalic and atraumatic.  Eyes:     General:        Right eye: No discharge.  Left eye: No discharge.  Neck:     Vascular: No JVD.  Cardiovascular:     Rate and Rhythm: Normal rate and regular rhythm.     Pulses:          Posterior tibial pulses are 2+ on the right side and 2+ on the left side.     Heart sounds: Normal heart sounds, S1 normal and S2 normal. Heart sounds not distant. No midsystolic click and no opening snap. No murmur heard.    No friction rub.  Pulmonary:     Effort: Pulmonary effort is normal. No respiratory distress.     Breath sounds: Normal breath sounds. No decreased breath sounds, wheezing, rhonchi or rales.  Chest:     Chest wall: No tenderness.  Abdominal:     General: There is no distension.  Musculoskeletal:     Cervical back: Normal range of motion.     Right lower leg: No edema.     Left lower leg: No edema.  Skin:    General: Skin is warm and dry.     Nails: There is no clubbing.  Neurological:     Mental Status: He is alert and oriented to person, place, and time.  Psychiatric:         Speech: Speech normal.        Behavior: Behavior normal.        Thought Content: Thought content normal.        Judgment: Judgment normal.     Wt Readings from Last 3 Encounters:  01/11/24 197 lb (89.4 kg)  11/12/23 195 lb 6 oz (88.6 kg)  10/16/23 196 lb 3.2 oz (89 kg)     ASSESSMENT & PLAN:   CAD status post CABG status post PCI without angina: He is doing well and without symptoms concerning for angina or cardiac decompensation.  Recent cardiac cath with medical management recommended as outlined above.  No indication for PCI given the LCx and RCA receives collaterals from the LAD.  Continue aggressive risk factor modification and secondary prevention including aspirin 81 mg and clopidogrel 75 mg given prior CABG with subsequent PCI as long as tolerated, along with atorvastatin 80 mg, and lower dose carvedilol 3.125 mg twice daily as outlined below.  No indication for further ischemic testing at this time.  HFrEF secondary to ICM: He is euvolemic and well compensated with NYHA class II symptoms.  With titration of GDMT he developed labile hypertension with significant hypotension prompting de-escalation of pharmacotherapy.  Given this, we will defer further escalation of GDMT at this time with continuation of carvedilol 3.125 mg twice daily, Farxiga 10 mg, and Entresto 24/26 mg twice daily.  Defer addition of MRA at this time given history of high normal potassiums and in the context of recent hypotensive episodes.  Not requiring standing loop diuretic.  Check BMP.  Labile hypertension: Blood pressure is overall improved in the office and at home.  Blood pressure readings are more stable on lower dose of carvedilol 3.125 mg twice daily along with Entresto 24/26 mg twice daily.  Given recent episodes of hypotension we will defer escalation of GDMT.  Continue as needed hydralazine 25 mg for blood pressure greater than 160 mmHg systolic.  HLD: LDL 31 in 08/2023 with normal AST/ALT at that time.   He remains on atorvastatin 80 mg.  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  He remains on DAPT and atorvastatin.  Followed by vascular surgery and podiatry.     Disposition: F/u with Dr. Mariah Milling or an APP in 3 months.   Medication Adjustments/Labs and Tests Ordered: Current medicines are reviewed at length with the patient today.  Concerns regarding medicines are outlined above. Medication changes, Labs and Tests ordered today are summarized above and listed in the Patient Instructions accessible in Encounters.   Signed, Eula Listen, PA-C 01/11/2024 9:52 AM     [facility] [place] [address] [place], [place] [address] [phone]

## 2024-01-10 ENCOUNTER — Encounter: Payer: Self-pay | Admitting: Oncology

## 2024-01-11 ENCOUNTER — Ambulatory Visit: Payer: Medicare HMO | Attending: Physician Assistant | Admitting: Physician Assistant

## 2024-01-11 ENCOUNTER — Encounter: Payer: Self-pay | Admitting: Physician Assistant

## 2024-01-11 VITALS — BP 140/73 | HR 67 | Ht 72.0 in | Wt 197.0 lb

## 2024-01-11 DIAGNOSIS — R0989 Other specified symptoms and signs involving the circulatory and respiratory systems: Secondary | ICD-10-CM

## 2024-01-11 DIAGNOSIS — I25810 Atherosclerosis of coronary artery bypass graft(s) without angina pectoris: Secondary | ICD-10-CM

## 2024-01-11 DIAGNOSIS — Z951 Presence of aortocoronary bypass graft: Secondary | ICD-10-CM

## 2024-01-11 DIAGNOSIS — I739 Peripheral vascular disease, unspecified: Secondary | ICD-10-CM

## 2024-01-11 DIAGNOSIS — I255 Ischemic cardiomyopathy: Secondary | ICD-10-CM | POA: Diagnosis not present

## 2024-01-11 DIAGNOSIS — I5020 Unspecified systolic (congestive) heart failure: Secondary | ICD-10-CM | POA: Diagnosis not present

## 2024-01-11 DIAGNOSIS — E785 Hyperlipidemia, unspecified: Secondary | ICD-10-CM

## 2024-01-11 NOTE — Patient Instructions (Signed)
Medication Instructions:  No changes at this time.   *If you need a refill on your cardiac medications before your next appointment, please call your pharmacy*   Lab Work: BMP today  If you have labs (blood work) drawn today and your tests are completely normal, you will receive your results only by: MyChart Message (if you have MyChart) OR A paper copy in the mail If you have any lab test that is abnormal or we need to change your treatment, we will call you to review the results.   Testing/Procedures: None   Follow-Up: At [facility], you and your health needs are our priority.  As part of our continuing mission to provide you with exceptional heart care, we have created designated Provider Care Teams.  These Care Teams include your primary Cardiologist (physician) and Advanced Practice Providers (APPs -  Physician Assistants and Nurse Practitioners) who all work together to provide you with the care you need, when you need it.  We recommend signing up for the patient portal called "MyChart".  Sign up information is provided on this After Visit Summary.  MyChart is used to connect with patients for Virtual Visits (Telemedicine).  Patients are able to view lab/test results, encounter notes, upcoming appointments, etc.  Non-urgent messages can be sent to your provider as well.   To learn more about what you can do with MyChart, go to [email or web address].    Your next appointment:   3 month(s)  Provider:   Julien Nordmann, MD or Eula Listen, PA-C

## 2024-01-12 LAB — BASIC METABOLIC PANEL
BUN/Creatinine Ratio: 15 (ref 10–24)
BUN: 20 mg/dL (ref 8–27)
CO2: 23 mmol/L (ref 20–29)
Calcium: 9.4 mg/dL (ref 8.6–10.2)
Chloride: 100 mmol/L (ref 96–106)
Creatinine, Ser: 1.31 mg/dL — ABNORMAL HIGH (ref 0.76–1.27)
Glucose: 179 mg/dL — ABNORMAL HIGH (ref 70–99)
Potassium: 5.3 mmol/L — ABNORMAL HIGH (ref 3.5–5.2)
Sodium: 137 mmol/L (ref 134–144)
eGFR: 57 mL/min/{1.73_m2} — ABNORMAL LOW (ref >59)

## 2024-01-16 ENCOUNTER — Other Ambulatory Visit: Payer: Self-pay | Admitting: Emergency Medicine

## 2024-01-16 DIAGNOSIS — Z79899 Other long term (current) drug therapy: Secondary | ICD-10-CM

## 2024-01-19 LAB — POTASSIUM: Potassium: 4.8 mmol/L (ref 3.5–5.2)

## 2024-03-21 ENCOUNTER — Telehealth: Payer: Self-pay

## 2024-03-21 MED ORDER — DAPAGLIFLOZIN PROPANEDIOL 10 MG PO TABS: 10.0000 mg | ORAL_TABLET | Freq: Every day | ORAL | 2 refills | Status: DC

## 2024-03-21 NOTE — Telephone Encounter (Signed)
 Pt's medication was sent to pt's pharmacy as requested. Confirmation received.

## 2024-03-21 NOTE — Telephone Encounter (Signed)
 Received a fax notice in the [facility] Patient Assistance OnBase que requesting a refill for Kenneth Duarte (see chart media). Please send in prescription for Farxiga to [facility], [place]

## 2024-03-25 ENCOUNTER — Telehealth: Payer: Self-pay | Admitting: *Deleted

## 2024-03-25 MED ORDER — DAPAGLIFLOZIN PROPANEDIOL 10 MG PO TABS: 10.0000 mg | ORAL_TABLET | Freq: Every day | ORAL | 3 refills | Status: DC

## 2024-03-25 NOTE — Telephone Encounter (Signed)
 Refill prescription sent to mail order

## 2024-03-26 ENCOUNTER — Other Ambulatory Visit (INDEPENDENT_AMBULATORY_CARE_PROVIDER_SITE_OTHER): Payer: Self-pay | Admitting: Nurse Practitioner

## 2024-03-26 DIAGNOSIS — I739 Peripheral vascular disease, unspecified: Secondary | ICD-10-CM

## 2024-03-28 ENCOUNTER — Encounter (INDEPENDENT_AMBULATORY_CARE_PROVIDER_SITE_OTHER): Payer: Medicare HMO

## 2024-03-28 ENCOUNTER — Ambulatory Visit (INDEPENDENT_AMBULATORY_CARE_PROVIDER_SITE_OTHER): Payer: Medicare HMO | Admitting: Vascular Surgery

## 2024-04-09 ENCOUNTER — Encounter: Payer: Self-pay | Admitting: Oncology

## 2024-04-10 ENCOUNTER — Encounter: Payer: Self-pay | Admitting: Oncology

## 2024-04-11 ENCOUNTER — Encounter: Payer: Self-pay | Admitting: Physician Assistant

## 2024-04-11 ENCOUNTER — Ambulatory Visit: Payer: Medicare HMO | Attending: Physician Assistant | Admitting: Physician Assistant

## 2024-04-11 ENCOUNTER — Encounter: Payer: Self-pay | Admitting: Oncology

## 2024-04-11 VITALS — BP 120/60 | HR 70 | Ht 72.0 in | Wt 191.2 lb

## 2024-04-11 DIAGNOSIS — I25810 Atherosclerosis of coronary artery bypass graft(s) without angina pectoris: Secondary | ICD-10-CM

## 2024-04-11 DIAGNOSIS — I255 Ischemic cardiomyopathy: Secondary | ICD-10-CM | POA: Diagnosis not present

## 2024-04-11 DIAGNOSIS — Z951 Presence of aortocoronary bypass graft: Secondary | ICD-10-CM

## 2024-04-11 DIAGNOSIS — I5020 Unspecified systolic (congestive) heart failure: Secondary | ICD-10-CM

## 2024-04-11 DIAGNOSIS — R0989 Other specified symptoms and signs involving the circulatory and respiratory systems: Secondary | ICD-10-CM

## 2024-04-11 DIAGNOSIS — I739 Peripheral vascular disease, unspecified: Secondary | ICD-10-CM

## 2024-04-11 DIAGNOSIS — E785 Hyperlipidemia, unspecified: Secondary | ICD-10-CM

## 2024-04-11 NOTE — Patient Instructions (Signed)
 Medication Instructions:  NO CHANGES  *If you need a refill on your cardiac medications before your next appointment, please call your pharmacy*  Testing/Procedures: Your physician has requested that you have an echocardiogram - late October 2025. Echocardiography is a painless test that uses sound waves to create images of your heart. It provides your doctor with information about the size and shape of your heart and how well your heart's chambers and valves are working. This procedure takes approximately one hour. There are no restrictions for this procedure. Please do NOT wear cologne, perfume, aftershave, or lotions (deodorant is allowed). Please arrive 15 minutes prior to your appointment time.  Please note: We ask at that you not bring children with you during ultrasound (echo/ vascular) testing. Due to room size and safety concerns, children are not allowed in the ultrasound rooms during exams. Our front office staff cannot provide observation of children in our lobby area while testing is being conducted. An adult accompanying a patient to their appointment will only be allowed in the ultrasound room at the discretion of the ultrasound technician under special circumstances. We apologize for any inconvenience.   Follow-Up: At [facility], you and your health needs are our priority.  As part of our continuing mission to provide you with exceptional heart care, our providers are all part of one team.  This team includes your primary Cardiologist (physician) and Advanced Practice Providers or APPs (Physician Assistants and Nurse Practitioners) who all work together to provide you with the care you need, when you need it.  Your next appointment:   November 2025 with Varney Gentleman PA  We recommend signing up for the patient portal called "MyChart".  Sign up information is provided on this After Visit Summary.  MyChart is used to connect with patients for Virtual Visits (Telemedicine).   Patients are able to view lab/test results, encounter notes, upcoming appointments, etc.  Non-urgent messages can be sent to your provider as well.   To learn more about what you can do with MyChart, go to [email or web address].

## 2024-04-11 NOTE — Progress Notes (Signed)
 Cardiology Office Note    Date:  04/11/2024   ID:  Kenneth Duarte, DOB Aug 09, 1946, MRN [id number]  PCP:  Lorina Roosevelt, MD  Cardiologist:  Belva Boyden, MD  Electrophysiologist:  None   Chief Complaint: Follow-up  History of Present Illness:   Kenneth Duarte is a 78 y.o. 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   He was previously followed by [facility] cardiology.  LHC in 02/2017 showed multivessel CAD with the patient ultimately undergoing three-vessel bypass in 03/2017 as outlined above.  Intraoperative TEE showed an EF of 50 to 55%.  He underwent repeat cath in 06/2017 which showed severe native vessel CAD with patent LIMA to LAD with discrete 75% stenosis near the anastomotic site, occluded SVG to PDA, and patent SVG to OM1 with a long diffuse 95% stenosis in the proximal segment of the graft with near 60 to 70% stenosis at the anastomotic site.  There was 25% stenosis in the native vessel distal to the anastomotic site with retrograde filling of the LCx.  He was transferred to [facility] where he underwent repeat LHC with PCI/DES to the SVG to OM.  There was distal graft 75% stenosis with more disease noted past the graft insertion in the OM and distal LCx.     He was admitted to the hospital 03/2023 and 04/2023 with osteomyelitis involving the fifth metatarsal of the right foot status post stenting to the right  SFA along with angioplasty of the mid to distal SFA, popliteal artery, and right anterior tibial artery proximally and distally in 03/2023 along with partial ray amputation.  He was readmitted with nonhealing wound and underwent remaining fifth metatarsal bone amputation in 04/2023.   He was seen in the office in 04/2023 and was without symptoms of angina or cardiac decompensation.  No cardiac changes were indicated at that time.  With new lateral ST-T changes on EKG, he underwent echo in 06/2023 that showed an EF of 35 to 40%, global hypokinesis, severely dilated LV internal cavity size, grade 1 diastolic dysfunction, normal RV systolic function and ventricular cavity size, mildly dilated left atrium, mild to moderate mitral regurgitation, and an estimated right atrial pressure of 15 mmHg.   He was seen at outside ER on 07/13/2023 after sustaining a mechanical fall slipping on a wet ramp leading to rib fractures in ribs 2 through 6.  X-ray was also remarkable for a small right-sided pneumothorax that persisted on repeat chest x-ray with concern for increased fluid component as well.  Patient declined hospital admission and left AMA.   He was seen in the office on 08/23/2023 and remained without symptoms of angina or cardiac decompensation.  Given new cardiomyopathy, and EKG changes, he underwent R/LHC on 08/31/2023 showed significant underlying three-vessel CAD with chronically occluded ostial LCx and chronic subtotal occlusion of the mid RCA.  SVG to rPDA was chronically occluded.  SVG to OM,  which was stented previously, was now chronically occluded at the ostium.  LIMA to LAD was patent with no significant anastomosis stenosis.  The distal LAD provided collaterals to the rPDA and a diagonal branch provided collaterals to the OM's.  RHC showed normal filling pressures, minimal pulmonary pretension, and normal cardiac output.  There were no PCI options with both RCA and LCx receiving collaterals from the LAD.   Medical therapy was recommended.  He was seen in the office in 10/2023 noting a labile blood pressure with readings ranging from 106 up to 200 systolic.  He reported acute drops in BP of greater than 100 mmHg at times.  Blood pressure was elevated in the office at 180/60, had not taken the medications prior to office visit.  He was not requiring furosemide .  Primary cardiologist recommended the patient take medications at 8 AM and 8 PM with continuation of carvedilol  6.25 mg twice daily and Entresto  24/26 mg twice daily along with as needed hydralazine  25 mg for elevated blood pressure.  Carvedilol  was subsequently decreased to 6.25 mg twice daily in the setting of hypotension with BP in the 90s over 40s.  Repeat limited echo on 12/10/2023 showed an EF of 40%, severe hypokinesis of the basal to mid inferior and inferolateral wall, moderately dilated LV internal cavity size, mildly reduced RV systolic function with normal ventricular cavity size, mild mitral regurgitation, and an estimated right atrial pressure of 3 mmHg.  He was most recently seen in the office in 12/2023 with carvedilol  having been further decreased to 3.125 mg twice daily by outside office.  With this he was overall feeling better with improved dizziness.  He comes in doing very well from a cardiac perspective and is without symptoms of angina or cardiac decompensation.  Blood pressures at home have been very well-controlled ranging from 105-121/45-48 with heart rates ranging from 59 to 71 bpm.  No dizziness, presyncope, or syncope.  No falls, hematochezia, or melena.  Adherent and tolerating cardiac medications without off target effect.  No lower extremity swelling or progressive orthopnea.  Continues to monitor sodium intake.  Weight is down approximately 5.8 pounds today when compared to his visit in 12/2022.  Overall feels well and does not have any acute concerns at this time.   Labs independently reviewed: 12/2023 - potassium 4.8, BUN  20, serum creatinine 1.31 08/2023 - TC 92, TG 221, HDL 26, LDL 31, albumin  4.4, AST/ALT normal, Hgb 10.9, PLT 189 04/2023 - magnesium  1.5 03/2023 - A1c 7.0 06/2018 - TSH normal  Past Medical History:  Diagnosis Date   Arthritis    Chronic pain syndrome    Congestive heart failure (HCC) 1980   Coronary artery disease    a. s/p 3V CABG (03/2017). b. NSTEMI 06/2017 with progressive graft disease, s/p DES to SVG-OM.   Diabetes (HCC)    DIET   Dyspnea    with exertion   ED (erectile dysfunction)    GERD (gastroesophageal reflux disease)    Gout    Headache    Hyperlipemia    Hypertension    CONTROLLED ON MEDS   IDA (iron  deficiency anemia) 10/21/2019   Neuropathy of both feet    Seborrheic keratosis    Sinus congestion    Vertigo    Wears dentures    PARTIAL UPPER    Past Surgical History:  Procedure Laterality Date   COLONOSCOPY WITH PROPOFOL  N/A 10/25/2015   Procedure: COLONOSCOPY WITH PROPOFOL ;  Surgeon: Marnee Sink, MD;  Location:  [facility];  Service: Endoscopy;  Laterality: N/A;  DIABETIC-ORAL MEDS   COLONOSCOPY WITH PROPOFOL  N/A 10/22/2020   Procedure: COLONOSCOPY CANCELED;  Surgeon: Marnee Sink, MD;  Location: [facility];  Service: Endoscopy;  Laterality: N/A;  procedure aborted poor prep   COLONOSCOPY WITH PROPOFOL  N/A 10/25/2020   Procedure: COLONOSCOPY WITH PROPOFOL ;  Surgeon: Marnee Sink, MD;  Location: [facility];  Service: Endoscopy;  Laterality: N/A;   CORONARY ARTERY BYPASS GRAFT N/A 04/10/2017   Procedure: CORONARY ARTERY BYPASS GRAFTING (CABG) x three , using left internal mammary artery and right leg greater saphenous vein harvested endoscopically;  Surgeon: Heriberto London, MD;  Location: [facility] OR;  Service: Open Heart Surgery;  Laterality: N/A;   CORONARY ARTERY BYPASS GRAFT     CORONARY STENT INTERVENTION N/A 06/19/2017   Procedure: Coronary Stent Intervention;  Surgeon: Lucendia Rusk, MD;  Location: [facility] INVASIVE CV LAB;  Service:  Cardiovascular;  Laterality: N/A;   ESOPHAGOGASTRODUODENOSCOPY (EGD) WITH PROPOFOL  N/A 10/22/2020   Procedure: ESOPHAGOGASTRODUODENOSCOPY (EGD) WITH PROPOFOL ;  Surgeon: Marnee Sink, MD;  Location: [facility];  Service: Endoscopy;  Laterality: N/A;  Diabetic - oral meds   EXCISION PARTIAL PHALANX Right 03/22/2023   Procedure: EXCISION  PHALANX;  Surgeon: Pink Bridges, DPM;  Location: [facility];  Service: Podiatry;  Laterality: Right;   INCISION AND DRAINAGE Right 04/19/2023   Procedure: INCISION AND DRAINAGE;  Surgeon: Angel Barba, DPM;  Location: [facility];  Service: Podiatry;  Laterality: Right;   IRRIGATION AND DEBRIDEMENT FOOT Right 03/22/2023   Procedure: IRRIGATION AND DEBRIDEMENT FOOT;  Surgeon: Pink Bridges, DPM;  Location: [facility];  Service: Podiatry;  Laterality: Right;   KNEE SURGERY Right    over 20 years ago   LEFT HEART CATH AND CORONARY ANGIOGRAPHY Left 02/22/2017   Procedure: Left Heart Cath and Coronary Angiography;  Surgeon: Michelle Aid, MD;  Location: [facility] INVASIVE CV LAB;  Service: Cardiovascular;  Laterality: Left;   LEFT HEART CATH AND CORS/GRAFTS ANGIOGRAPHY N/A 06/18/2017   Procedure: Left Heart Cath and Cors/Grafts Angiography and PCI;  Surgeon: Percival Brace, MD;  Location: [facility] INVASIVE CV LAB;  Service: Cardiovascular;  Laterality: N/A;   LOWER EXTREMITY ANGIOGRAPHY Right 03/23/2023   Procedure: Lower Extremity Angiography;  Surgeon: Celso College, MD;  Location: [facility] INVASIVE CV LAB;  Service: Cardiovascular;  Laterality: Right;   LOWER EXTREMITY INTERVENTION  03/23/2023   Procedure: LOWER EXTREMITY INTERVENTION;  Surgeon: Celso College, MD;  Location: [facility] INVASIVE CV LAB;  Service: Cardiovascular;;   POLYPECTOMY  10/25/2015   Procedure: POLYPECTOMY;  Surgeon: Marnee Sink, MD;  Location: [facility];  Service: Endoscopy;;   RIGHT/LEFT HEART CATH AND CORONARY/GRAFT ANGIOGRAPHY Bilateral 08/31/2023   Procedure: RIGHT/LEFT HEART CATH AND CORONARY/GRAFT  ANGIOGRAPHY;  Surgeon: Wenona Hamilton, MD;  Location: [facility] INVASIVE CV LAB;  Service: Cardiovascular;  Laterality: Bilateral;   ROTATOR CUFF REPAIR Right 2012   TEE WITHOUT CARDIOVERSION N/A 04/10/2017   Procedure: TRANSESOPHAGEAL ECHOCARDIOGRAM (TEE);  Surgeon: Heriberto London, MD;  Location: [facility] OR;  Service: Open Heart Surgery;  Laterality: N/A;    Current Medications: Current Meds  Medication Sig   ACCU-CHEK AVIVA PLUS test strip    Accu-Chek Softclix Lancets lancets    Alcohol Swabs (B-D SINGLE USE SWABS REGULAR) PADS    aspirin  81 MG tablet Take 1 tablet (81 mg total) by mouth daily.   atorvastatin  (LIPITOR ) 80 MG tablet TAKE 1 TABLET AT BEDTIME   carvedilol  (COREG ) 3.125 MG tablet  Take 3.125 mg by mouth 2 (two) times daily.   clopidogrel  (PLAVIX ) 75 MG tablet TAKE 1 TABLET EVERY DAY   dapagliflozin  propanediol (FARXIGA ) 10 MG TABS tablet Take 1 tablet (10 mg total) by mouth daily.   DENTA 5000 PLUS 1.1 % CREA dental cream SMARTSIG:sparingly By Mouth   ENTRESTO  24-26 MG TAKE 1 TABLET TWICE DAILY   gabapentin  (NEURONTIN ) 800 MG tablet Take 800 mg by mouth daily.   meclizine  (ANTIVERT ) 25 MG tablet TAKE 1 TABLET TWICE DAILY AS NEEDED FOR DIZZINESS   metFORMIN  (GLUCOPHAGE -XR) 500 MG 24 hr tablet Take 500 mg by mouth daily.   nitroGLYCERIN  (NITROSTAT ) 0.4 MG SL tablet Place 1 tablet (0.4 mg total) under the tongue every 5 (five) minutes as needed for chest pain.   oxyCODONE -acetaminophen  (PERCOCET) 10-325 MG tablet Take 2 tablets by mouth every 6 (six) hours as needed for pain.   pantoprazole  (PROTONIX ) 40 MG tablet TAKE 1 TABLET EVERY DAY   sacubitril-valsartan (ENTRESTO ) 24-26 MG Take 1 tablet by mouth 2 (two) times daily.   vitamin B-12 (CYANOCOBALAMIN ) 1000 MCG tablet Take 1,000 mcg by mouth daily.   VITAMIN D -1000 MAX ST 25 MCG (1000 UT) tablet Take 1,000 Units by mouth daily.    Allergies:   Blood-group specific substance and Cortisone acetate [cortisone]   Social History    Socioeconomic History   Marital status: Married    Spouse name: Not on file   Number of children: Not on file   Years of education: Not on file   Highest education level: Not on file  Occupational History   Not on file  Tobacco Use   Smoking status: Former    Current packs/day: 0.00    Average packs/day: 2.0 packs/day for 15.0 years (30.0 ttl pk-yrs)    Types: Cigarettes    Start date: 06/29/1964    Quit date: 06/30/1979    Years since quitting: 44.8   Smokeless tobacco: Never  Vaping Use   Vaping status: Never Used  Substance and Sexual Activity   Alcohol use: No    Alcohol/week: 0.0 standard drinks of alcohol    Comment: QUIT IN 1980   Drug use: No   Sexual activity: Yes    Partners: Male    Birth control/protection: Other-see comments  Other Topics Concern   Not on file  Social History Narrative   Not on file   Social Drivers of Health   Financial Resource Strain: Low Risk  (01/10/2024)   Received from [facility]   Overall Financial Resource Strain (CARDIA)    Difficulty of Paying Living Expenses: Not hard at all  Food Insecurity: No Food Insecurity (01/10/2024)   Received from [facility]   Hunger Vital Sign    Worried About Running Out of Food in the Last Year: Never true    Ran Out of Food in the Last Year: Never true  Transportation Needs: No Transportation Needs (01/10/2024)   [facility] - Transportation    In the past 12 months, has lack of transportation kept you from medical appointments or from getting medications?: No    Lack of Transportation (Non-Medical): No  Physical Activity: Not on file  Stress: Not on file  Social Connections: Not on file     Family History:  The patient's family history includes Cancer in his father and mother; Diabetes in his brother and sister; Heart attack in his father; Heart disease in his brother and  father; Prostate cancer in his brother.  There is no history of Bladder Cancer or Kidney disease.  ROS:   12-point review of systems is negative unless otherwise noted in the HPI.   EKGs/Labs/Other Studies Reviewed:    Studies reviewed were summarized above. The additional studies were reviewed today:  Limited echo 12/10/2023: 1. Left ventricular ejection fraction, by estimation, is 40%   2. The left ventricle has mildly decreased function. The left ventricle  demonstrates regional wall motion abnormalities (severe hypokinesis of the  basal to mid inferior and inferolateral wall). The left ventricular  internal cavity size was moderately  dilated. Left ventricular diastolic parameters are indeterminate.   3. Right ventricular systolic function is mildly reduced. The right  ventricular size is normal. Tricuspid regurgitation signal is inadequate  for assessing PA pressure.   4. The mitral valve is normal in structure. Mild mitral valve  regurgitation. No evidence of mitral stenosis.   5. The aortic valve is normal in structure. Aortic valve regurgitation is  not visualized. No aortic stenosis is present.   6. The inferior vena cava is normal in size with greater than 50%  respiratory variability, suggesting right atrial pressure of 3 mmHg.   Comparison(s): There is new RWMA-inferior & anterior lateral wall appears  akinetic in both 4CH and 3CH views.  __________   [facility] 08/31/2023:   Ost LAD lesion is 30% stenosed.   Ost Cx to Prox Cx lesion is 100% stenosed.   Mid Cx lesion is 80% stenosed.   Origin lesion is 100% stenosed.   1st Mrg lesion is 99% stenosed.   Prox RCA to Mid RCA lesion is 99% stenosed.   Origin to Prox Graft lesion is 100% stenosed.   Prox LAD to Mid LAD lesion is 80% stenosed.   Dist LM to Ost LAD lesion is 50% stenosed.   SVG.   LIMA and is normal in caliber.   1 significant underlying three-vessel coronary artery disease with chronically occluded ostial left circumflex and chronic subtotal  occlusion of the mid right coronary artery.  SVG to right PDA is chronically occluded.  SVG to OM which was stented before is now chronically occluded at the ostium.  LIMA to LAD is patent with no significant anastomosis stenosis.  Distal LAD provides collaterals to the right PDA and the diagonal branch provides collaterals to OMs. 2.  Left ventricular angiography was not performed.  EF was moderately reduced by echo. 3.  Right heart catheterization showed normal filling pressures, minimal pulmonary hypertension and normal cardiac output.   RA: 9 mmHg RV: 33/4 with an EDP of 9 mmHg PW: 10 mmHg PA: 35/13 with a mean of 21 mmHg Cardiac output is 5.64 with an index of 2.72.   Recommendations: Recommend medical therapy for coronary artery disease.  No PCI options. Both RCA and left circumflex get collaterals from the LAD. __________   2D echo 06/29/2023: 1. Left ventricular ejection fraction, by estimation, is 35 to 40%. The  left ventricle has moderately decreased function. The left ventricle  demonstrates global hypokinesis. The left ventricular internal cavity size  was severely dilated. Left  ventricular diastolic parameters are consistent with Grade I diastolic  dysfunction (impaired relaxation).   2. Right ventricular systolic function is normal. The right ventricular  size is normal.   3. Left atrial size was mildly dilated.   4. The mitral valve is normal in structure. Mild to moderate mitral valve  regurgitation.   5. The aortic valve is  tricuspid. Aortic valve regurgitation is not  visualized.   6. The inferior vena cava is dilated in size with <50% respiratory  variability, suggesting right atrial pressure of 15 mmHg.  __________   LHC 06/19/2017: Origin to Prox Graft lesion, 99 %stenosed, in the SVG to OM. A STENT PROMUS PREM MR 4.0X38 drug eluting stent was successfully placed. IC Adenosine  given prior to angioplasty. No distal protection used since it is a recently placed  graft. Post intervention, there is a 0% residual stenosis. Dist Graft lesion, 75 %stenosed. There is more disease noted past the graft insertion in the OM and in the distal circumflex.   Continue dual antiplatelet therapy along with aggressive secondary prevention. __________   LHC 06/18/2017: Ost Cx to Prox Cx lesion, 100 %stenosed. Ost LM lesion, 50 %stenosed. Ost LAD lesion, 30 %stenosed. Mid RCA lesion, 95 %stenosed. SVG. Origin lesion, 100 %stenosed. SVG. Origin to Prox Graft lesion, 99 %stenosed. Dist Graft to Insertion lesion, 60 %stenosed. 1st Mrg lesion, 99 %stenosed. LIMA. Dist Graft lesion, 75 %stenosed.   1. Severe three-vessel coronary artery disease with heavily calcified, eccentric 50-60% stenosis distal left main, occluded proximal left circumflex, subtotal mid RCA. 2. Patent LIMA to mid LAD with discrete 75% stenosis near anastomotic site 3. Occluded SVG to PDA 4. Patent SVG to OM1 with a long diffuse 95% stenosis proximal segment of graft, with near 60-70% stenosis at the anastomotic site, 95% stenosis in the native vessel distal to the anastomotic site, with retrograde filling of left circumflex 5. Mildly reduced left ventricular function, with inferior-posterior wall akinesis   Recommendations   1. Transfer to [facility] for high risk, technically challenging PCI versus redo CABG __________   Intraoperative TEE 04/10/2017:  Left ventricle: Normal cavity size, wall thickness, left ventricular diastolic function and left atrial pressure. LV systolic function is low normal with an EF of 50-55%. There are no obvious wall motion abnormalities. No thrombus present. No mass present.  Mitral valve: Mild regurgitation.  Right ventricle: Normal cavity size, wall thickness and ejection fraction.  Tricuspid valve: Mild regurgitation.     EKG:  EKG is not ordered today.    Recent Labs: 04/20/2023: Magnesium  1.5 08/23/2023: Platelets 189 08/31/2023: Hemoglobin  10.5 09/11/2023: ALT 11 01/11/2024: BUN 20; Creatinine, Ser 1.31; Sodium 137 01/18/2024: Potassium 4.8  Recent Lipid Panel    Component Value Date/Time   CHOL 92 (L) 09/11/2023 0859   TRIG 221 (H) 09/11/2023 0859   HDL 26 (L) 09/11/2023 0859   CHOLHDL 3.5 09/11/2023 0859   CHOLHDL 4.1 06/18/2017 2337   VLDL 40 06/18/2017 2337   LDLCALC 31 09/11/2023 0859    PHYSICAL EXAM:    VS:  BP 120/60 (BP Location: Left Arm, Patient Position: Sitting)   Pulse 70   Ht 6' (1.829 m)   Wt 191 lb 3.2 oz (86.7 kg)   SpO2 99%   BMI 25.93 kg/m   BMI: Body mass index is 25.93 kg/m.  Physical Exam Vitals reviewed.  Constitutional:      Appearance: He is well-developed.  HENT:     Head: Normocephalic and atraumatic.  Eyes:     General:        Right eye: No discharge.        Left eye: No discharge.  Cardiovascular:     Rate and Rhythm: Normal rate and regular rhythm.     Pulses:          Posterior tibial pulses are 2+ on the right side and  2+ on the left side.     Heart sounds: Normal heart sounds, S1 normal and S2 normal. Heart sounds not distant. No midsystolic click and no opening snap. No murmur heard.    No friction rub.  Pulmonary:     Effort: Pulmonary effort is normal. No respiratory distress.     Breath sounds: Normal breath sounds. No decreased breath sounds, wheezing, rhonchi or rales.  Chest:     Chest wall: No tenderness.  Musculoskeletal:     Cervical back: Normal range of motion.     Right lower leg: No edema.     Left lower leg: No edema.  Skin:    General: Skin is warm and dry.     Nails: There is no clubbing.  Neurological:     Mental Status: He is alert and oriented to person, place, and time.  Psychiatric:        Speech: Speech normal.        Behavior: Behavior normal.        Thought Content: Thought content normal.        Judgment: Judgment normal.     Wt Readings from Last 3 Encounters:  04/11/24 191 lb 3.2 oz (86.7 kg)  01/11/24 197 lb (89.4 kg)   11/12/23 195 lb 6 oz (88.6 kg)     ASSESSMENT & PLAN:   CAD status post CABG status post PCI without angina: He continues to do very well and is without symptoms of angina or cardiac decompensation.  Recent cardiac cath with medical management recommended as outlined above.  No indication for PCI given LCx and RCA received collaterals from the LAD.  Continue aggressive risk factor modification and secondary prevention including aspirin  81 mg, clopidogrel  75 mg given prior CABG with subsequent PCI as long as tolerated with atorvastatin  80 mg and carvedilol  3.125 mg twice daily.  No indication for further ischemic testing at this time.  HFrEF secondary to ICM: He is euvolemic and well compensated with NYHA class II symptoms.  Labile hypertension with significant hypotension has led to the de-escalation of pharmacotherapy.  Given this, we will defer further escalation of GDMT at this time with continuation of carvedilol  3.125 mg twice daily, Farxiga  10 mg, and Entresto  24/26 mg twice daily.  Defer addition of MRA given history of high normal potassium seen in the context of recent hyperkalemia and hypotensive episodes.  Not requiring a standing loop diuretic.  Anticipate follow-up limited echo to evaluate LV systolic function in 6 months.  Labile hypertension: Blood pressure is well-controlled at home and in the office today.  Continue pharmacotherapy as outlined above.  HLD: LDL 31 in 08/2023 with normal AST/ALT at that time.  He remains on atorvastatin  80 mg.  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  He remains on DAPT and atorvastatin .  Followed by vascular surgery and podiatry.     Disposition: F/u with Dr. Gollan or an APP in 7 months.   Medication Adjustments/Labs and Tests Ordered: Current medicines are reviewed at length with the patient today.  Concerns regarding  medicines are outlined above. Medication changes, Labs and Tests ordered today are summarized above and listed in the Patient Instructions accessible in Encounters.   Signed, Varney Gentleman, PA-C 04/11/2024 9:44 AM     [facility] [address] [place], [place] [address] [phone]

## 2024-04-14 ENCOUNTER — Other Ambulatory Visit: Payer: Self-pay | Admitting: Cardiovascular Disease

## 2024-04-14 ENCOUNTER — Ambulatory Visit (INDEPENDENT_AMBULATORY_CARE_PROVIDER_SITE_OTHER)

## 2024-04-14 DIAGNOSIS — I739 Peripheral vascular disease, unspecified: Secondary | ICD-10-CM

## 2024-04-14 DIAGNOSIS — Z9889 Other specified postprocedural states: Secondary | ICD-10-CM | POA: Diagnosis not present

## 2024-04-15 ENCOUNTER — Telehealth: Payer: Self-pay | Admitting: Physician Assistant

## 2024-04-15 LAB — VAS US ABI WITH/WO TBI
Left ABI: >1
Right ABI: >1

## 2024-04-15 NOTE — Telephone Encounter (Signed)
 Kenneth Duarte was doing very well from a cardiac perspective at his recent visit on 04/11/2024.  He was without symptoms of angina or cardiac decompensation.  There are no contraindications from a cardiac perspective for the patient to have a partial left third toe amputation.  Per [employer]CRI, there is an estimated rate of 6% for adverse cardiac event in the periprocedural timeframe.  Given recent cardiac cath in 08/2023 showed recommendation for medical therapy with no PCI options with both RCA and LCx receiving collaterals from the LAD, no further cardiac testing will mitigate this risk.  Antiplatelet management per preop team.

## 2024-04-15 NOTE — Telephone Encounter (Signed)
   Pre-operative Risk Assessment    Patient Name: Kenneth Duarte  DOB: 1946-08-04 MRN: [id number]   Date of last office visit: 04/11/24 Date of next office visit: n/a   Request for Surgical Clearance    Procedure:  Left third toe amputation partial  Date of Surgery:  Clearance 04/18/24                                Surgeon:  Dr. Larey Plenty Surgeon's Group or Practice Name:  [facility] Phone number:  [phone] Fax number:  [phone]   Type of Clearance Requested:   - Medical    Type of Anesthesia:  Not Indicated   Additional requests/questions:    Signed, Fletcher Humble   04/15/2024, 2:41 PM

## 2024-04-15 NOTE — Telephone Encounter (Signed)
     Primary Cardiologist: Timothy Gollan, MD  Chart reviewed as part of pre-operative protocol coverage. Given past medical history and time since last visit, based on ACC/AHA guidelines, Kenneth Duarte would be at acceptable risk for the planned procedure without further cardiovascular testing.   Per Varney Gentleman, PA-C  "Kenneth Duarte was doing very well from a cardiac perspective at his recent visit on 04/11/2024.  He was without symptoms of angina or cardiac decompensation.  There are no contraindications from a cardiac perspective for the patient to have a partial left third toe amputation.  Per RCRI, there is an estimated rate of 6% for adverse cardiac event in the periprocedural timeframe.  Given recent cardiac cath in 08/2023 showed recommendation for medical therapy with no PCI options with both RCA and LCx receiving collaterals from the LAD, no further cardiac testing will mitigate this risk. "  I will route this recommendation to the requesting party via Epic fax function and remove from pre-op  pool.  Please call with questions.  Chet Cota. Del Wiseman NP-C     04/15/2024, 3:50 PM [facility] [address] Office [phone] Fax [phone]

## 2024-04-15 NOTE — Telephone Encounter (Signed)
 Kenneth Duarte,  Kenneth Duarte is requesting preoperative cardiac evaluation for left third toe amputation.  Procedure is scheduled for 04/18/24.  He was recently seen in clinic by you on 04/11/2024.  He remains stable from a cardiac standpoint.  Follow-up was planned for 7 months.  Would you be able to comment on cardiac risk for upcoming procedure?  Thank you for your help.  Please direct your response to CV DIV preop pool.  Chet Cota. Chalet Kerwin NP-C     04/15/2024, 3:21 PM [facility] [address] Office [phone] Fax [phone]

## 2024-04-16 ENCOUNTER — Other Ambulatory Visit: Payer: Self-pay | Admitting: Podiatry

## 2024-04-17 ENCOUNTER — Other Ambulatory Visit: Payer: Self-pay

## 2024-04-17 ENCOUNTER — Encounter
Admission: RE | Admit: 2024-04-17 | Discharge: 2024-04-17 | Disposition: A | Discharge status: Home / Self Care | Source: Ambulatory Visit | Attending: Podiatry | Admitting: Podiatry

## 2024-04-17 ENCOUNTER — Inpatient Hospital Stay: Admission: RE | Admit: 2024-04-17 | Source: Ambulatory Visit

## 2024-04-17 ENCOUNTER — Encounter: Payer: Self-pay | Admitting: Oncology

## 2024-04-17 DIAGNOSIS — E1169 Type 2 diabetes mellitus with other specified complication: Secondary | ICD-10-CM | POA: Diagnosis not present

## 2024-04-17 DIAGNOSIS — I509 Heart failure, unspecified: Secondary | ICD-10-CM | POA: Diagnosis not present

## 2024-04-17 DIAGNOSIS — I10 Essential (primary) hypertension: Secondary | ICD-10-CM

## 2024-04-17 DIAGNOSIS — Z01812 Encounter for preprocedural laboratory examination: Secondary | ICD-10-CM

## 2024-04-17 DIAGNOSIS — J449 Chronic obstructive pulmonary disease, unspecified: Secondary | ICD-10-CM

## 2024-04-17 DIAGNOSIS — D509 Iron deficiency anemia, unspecified: Secondary | ICD-10-CM

## 2024-04-17 DIAGNOSIS — I25708 Atherosclerosis of coronary artery bypass graft(s), unspecified, with other forms of angina pectoris: Secondary | ICD-10-CM

## 2024-04-17 DIAGNOSIS — Z01810 Encounter for preprocedural cardiovascular examination: Secondary | ICD-10-CM

## 2024-04-17 DIAGNOSIS — Z01818 Encounter for other preprocedural examination: Secondary | ICD-10-CM

## 2024-04-17 DIAGNOSIS — I110 Hypertensive heart disease with heart failure: Secondary | ICD-10-CM | POA: Insufficient documentation

## 2024-04-17 DIAGNOSIS — A4901 Methicillin susceptible Staphylococcus aureus infection, unspecified site: Secondary | ICD-10-CM

## 2024-04-17 HISTORY — DX: Non-ST elevation (NSTEMI) myocardial infarction: I21.4

## 2024-04-17 HISTORY — DX: Type 2 diabetes mellitus with other skin complications: E11.628

## 2024-04-17 HISTORY — DX: Acquired absence of other left toe(s): Z89.422

## 2024-04-17 HISTORY — DX: Type 2 diabetes mellitus without complications: E11.9

## 2024-04-17 HISTORY — DX: Other forms of angina pectoris: I20.89

## 2024-04-17 HISTORY — DX: Localized edema: R60.0

## 2024-04-17 HISTORY — DX: Occlusion and stenosis of bilateral carotid arteries: I65.23

## 2024-04-17 HISTORY — DX: Chronic obstructive pulmonary disease, unspecified: J44.9

## 2024-04-17 HISTORY — DX: Benign neoplasm of sigmoid colon: D12.5

## 2024-04-17 HISTORY — DX: Cellulitis of unspecified part of limb: L03.119

## 2024-04-17 HISTORY — DX: Cardiomyopathy, unspecified: I42.9

## 2024-04-17 HISTORY — DX: Osteomyelitis, unspecified: M86.9

## 2024-04-17 HISTORY — DX: Methicillin susceptible Staphylococcus aureus infection, unspecified site: A49.01

## 2024-04-17 HISTORY — DX: Presence of aortocoronary bypass graft: Z95.1

## 2024-04-17 LAB — CBC
HCT: 36.5 % — ABNORMAL LOW (ref 39.0–52.0)
Hemoglobin: 11.8 g/dL — ABNORMAL LOW (ref 13.0–17.0)
MCH: 28.3 pg (ref 26.0–34.0)
MCHC: 32.3 g/dL (ref 30.0–36.0)
MCV: 87.5 fL (ref 80.0–100.0)
Platelets: 177 10*3/uL (ref 150–400)
RBC: 4.17 MIL/uL — ABNORMAL LOW (ref 4.22–5.81)
RDW: 16 % — ABNORMAL HIGH (ref 11.5–15.5)
WBC: 7 10*3/uL (ref 4.0–10.5)
nRBC: 0 % (ref 0.0–0.2)

## 2024-04-17 LAB — BASIC METABOLIC PANEL WITH GFR
Anion gap: 9 (ref 5–15)
BUN: 20 mg/dL (ref 8–23)
CO2: 23 mmol/L (ref 22–32)
Calcium: 8.4 mg/dL — ABNORMAL LOW (ref 8.9–10.3)
Chloride: 103 mmol/L (ref 98–111)
Creatinine, Ser: 1.25 mg/dL — ABNORMAL HIGH (ref 0.61–1.24)
GFR, Estimated: >60 mL/min (ref >60)
Glucose, Bld: 93 mg/dL (ref 70–99)
Potassium: 4.1 mmol/L (ref 3.5–5.1)
Sodium: 135 mmol/L (ref 135–145)

## 2024-04-17 NOTE — Patient Instructions (Addendum)
 Your procedure is scheduled on:04-18-24 Friday Report to the Registration Desk on the 1st floor of the Medical [facility].Then proceed to the 2nd floor Surgery Desk To find out your arrival time, please call [phone] between 1PM - 3PM on:04-17-24 Thursday If your arrival time is 6:00 am, do not arrive before that time as the Medical Mall entrance doors do not open until 6:00 am.  REMEMBER: Instructions that are not followed completely may result in serious medical risk, up to and including death; or upon the discretion of your surgeon and anesthesiologist your surgery may need to be rescheduled.  Do not eat food after midnight the night before surgery.  No gum chewing or hard candies.  You may however, drink Water  up to 2 hours before you are scheduled to arrive for your surgery. Do not drink anything within 2 hours of your scheduled arrival time.  In addition, your doctor has ordered for you to drink the provided:  Gatorade G2 Drinking this carbohydrate drink up to two hours before surgery helps to reduce insulin  resistance and improve patient outcomes. Please complete drinking 2 hours before scheduled arrival time.  One week prior to surgery:Stop NOW (04-17-24) Stop Anti-inflammatories (NSAIDS) such as Advil, Aleve, Ibuprofen, Motrin, Naproxen, Naprosyn and Aspirin  based products such as Excedrin, Goody's Powder, BC Powder. Stop ANY OVER THE COUNTER supplements until after surgery (Vitamin B 12, Vitamin D )  You may however, continue to take Tylenol  if needed for pain up until the day of surgery.  Stop your dapagliflozin  propanediol (FARXIGA ) 3 days prior to surgery-Stop NOW (04-17-24)  Stop your metFORMIN  (GLUCOPHAGE -XR) 2 days prior to surgery-Stop NOW (04-17-24)  Last dose of clopidogrel  (PLAVIX ) was on 04-14-24 Monday  Continue taking all of your other prescription medications up until the day of surgery.  ON THE DAY OF SURGERY ONLY TAKE THESE MEDICATIONS WITH SIPS OF WATER : -carvedilol   (COREG )  -gabapentin  (NEURONTIN )  -pantoprazole  (PROTONIX )  -meclizine  (ANTIVERT )  -oxyCODONE -acetaminophen  (PERCOCET)  -Take hydrALAZINE  (APRESOLINE ) if needed as prescribed (blood pressure > 160)  Continue your 81 mg Aspirin  up until the day prior to surgery-Do NOT take the morning of surgery  No Alcohol for 24 hours before or after surgery.  No Smoking including e-cigarettes for 24 hours before surgery.  No chewable tobacco products for at least 6 hours before surgery.  No nicotine patches on the day of surgery.  Do not use any "recreational" drugs for at least a week (preferably 2 weeks) before your surgery.  Please be advised that the combination of cocaine and anesthesia may have negative outcomes, up to and including death. If you test positive for cocaine, your surgery will be cancelled.  On the morning of surgery brush your teeth with toothpaste and water , you may rinse your mouth with mouthwash if you wish. Do not swallow any toothpaste or mouthwash.  Use CHG Soap as directed on instruction sheet.  Do not wear jewelry, make-up, hairpins, clips or nail polish.  For welded (permanent) jewelry: bracelets, anklets, waist bands, etc.  Please have this removed prior to surgery.  If it is not removed, there is a chance that hospital personnel will need to cut it off on the day of surgery.  Do not wear lotions, powders, or perfumes.   Do not shave body hair from the neck down 48 hours before surgery.  Contact lenses, hearing aids and dentures may not be worn into surgery.  Do not bring valuables to the hospital. [facility] is not responsible for any  missing/lost belongings or valuables.   Notify your doctor if there is any change in your medical condition (cold, fever, infection).  Wear comfortable clothing (specific to your surgery type) to the hospital.  After surgery, you can help prevent lung complications by doing breathing exercises.  Take deep breaths and cough  every 1-2 hours. Your doctor may order a device called an Incentive Spirometer to help you take deep breaths. When coughing or sneezing, hold a pillow firmly against your incision with both hands. This is called "splinting." Doing this helps protect your incision. It also decreases belly discomfort.  If you are being admitted to the hospital overnight, leave your suitcase in the car. After surgery it may be brought to your room.  In case of increased patient census, it may be necessary for you, the patient, to continue your postoperative care in the Same Day Surgery department.  If you are being discharged the day of surgery, you will not be allowed to drive home. You will need a responsible individual to drive you home and stay with you for 24 hours after surgery.   If you are taking public transportation, you will need to have a responsible individual with you.  Please call the Pre-admissions Testing Dept. at [phone] if you have any questions about these instructions.  Surgery Visitation Policy:  Patients having surgery or a procedure may have two visitors.  Children under the age of 86 must have an adult with them who is not the patient.     Preparing for Surgery with CHLORHEXIDINE  GLUCONATE (CHG) Soap  Chlorhexidine  Gluconate (CHG) Soap  o An antiseptic cleaner that kills germs and bonds with the skin to continue killing germs even after washing  o Used for showering the night before surgery and morning of surgery  Before surgery, you can play an important role by reducing the number of germs on your skin.  CHG (Chlorhexidine  gluconate) soap is an antiseptic cleanser which kills germs and bonds with the skin to continue killing germs even after washing.  Please do not use if you have an allergy to CHG or antibacterial soaps. If your skin becomes reddened/irritated stop using the CHG.  1. Shower the NIGHT BEFORE SURGERY and the MORNING OF SURGERY with CHG soap.  2. If  you choose to wash your hair, wash your hair first as usual with your normal shampoo.  3. After shampooing, rinse your hair and body thoroughly to remove the shampoo.  4. Use CHG as you would any other liquid soap. You can apply CHG directly to the skin and wash gently with a scrungie or a clean washcloth.  5. Apply the CHG soap to your body only from the neck down. Do not use on open wounds or open sores. Avoid contact with your eyes, ears, mouth, and genitals (private parts). Wash face and genitals (private parts) with your normal soap.  6. Wash thoroughly, paying special attention to the area where your surgery will be performed.  7. Thoroughly rinse your body with warm water .  8. Do not shower/wash with your normal soap after using and rinsing off the CHG soap.  9. Pat yourself dry with a clean towel.  10. Wear clean pajamas to bed the night before surgery.  12. Place clean sheets on your bed the night of your first shower and do not sleep with pets.  13. Shower again with the CHG soap on the day of surgery prior to arriving at the hospital.  14. Do  not apply any deodorants/lotions/powders.  15. Please wear clean clothes to the hospital.  How to Use an Incentive Spirometer An incentive spirometer is a tool that measures how well you are filling your lungs with each breath. Learning to take long, deep breaths using this tool can help you keep your lungs clear and active. This may help to reverse or lessen your chance of developing breathing (pulmonary) problems, especially infection. You may be asked to use a spirometer: After a surgery. If you have a lung problem or a history of smoking. After a long period of time when you have been unable to move or be active. If the spirometer includes an indicator to show the highest number that you have reached, your health care provider or respiratory therapist will help you set a goal. Keep a log of your progress as told by your health care  provider. What are the risks? Breathing too quickly may cause dizziness or cause you to pass out. Take your time so you do not get dizzy or light-headed. If you are in pain, you may need to take pain medicine before doing incentive spirometry. It is harder to take a deep breath if you are having pain. How to use your incentive spirometer  Sit up on the edge of your bed or on a chair. Hold the incentive spirometer so that it is in an upright position. Before you use the spirometer, breathe out normally. Place the mouthpiece in your mouth. Make sure your lips are closed tightly around it. Breathe in slowly and as deeply as you can through your mouth, causing the piston or the ball to rise toward the top of the chamber. Hold your breath for 3-5 seconds, or for as long as possible. If the spirometer includes a coach indicator, use this to guide you in breathing. Slow down your breathing if the indicator goes above the marked areas. Remove the mouthpiece from your mouth and breathe out normally. The piston or ball will return to the bottom of the chamber. Rest for a few seconds, then repeat the steps 10 or more times. Take your time and take a few normal breaths between deep breaths so that you do not get dizzy or light-headed. Do this every 1-2 hours when you are awake. If the spirometer includes a goal marker to show the highest number you have reached (best effort), use this as a goal to work toward during each repetition. After each set of 10 deep breaths, cough a few times. This will help to make sure that your lungs are clear. If you have an incision on your chest or abdomen from surgery, place a pillow or a rolled-up towel firmly against the incision when you cough. This can help to reduce pain while taking deep breaths and coughing. General tips When you are able to get out of bed: Walk around often. Continue to take deep breaths and cough in order to clear your lungs. Keep using the  incentive spirometer until your health care provider says it is okay to stop using it. If you have been in the hospital, you may be told to keep using the spirometer at home. Contact a health care provider if: You are having difficulty using the spirometer. You have trouble using the spirometer as often as instructed. Your pain medicine is not giving enough relief for you to use the spirometer as told. You have a fever. Get help right away if: You develop shortness of breath. You develop a cough  with bloody mucus from the lungs. You have fluid or blood coming from an incision site after you cough. Summary An incentive spirometer is a tool that can help you learn to take long, deep breaths to keep your lungs clear and active. You may be asked to use a spirometer after a surgery, if you have a lung problem or a history of smoking, or if you have been inactive for a long period of time. Use your incentive spirometer as instructed every 1-2 hours while you are awake. If you have an incision on your chest or abdomen, place a pillow or a rolled-up towel firmly against your incision when you cough. This will help to reduce pain. Get help right away if you have shortness of breath, you cough up bloody mucus, or blood comes from your incision when you cough. This information is not intended to replace advice given to you by your health care provider. Make sure you discuss any questions you have with your health care provider. Document Revised: 10/12/2023 Document Reviewed: 10/12/2023 Elsevier Patient Education  2024 [employer].

## 2024-04-18 ENCOUNTER — Ambulatory Visit
Admission: RE | Admit: 2024-04-18 | Discharge: 2024-04-18 | Disposition: A | Discharge status: Home / Self Care | Attending: Podiatry | Admitting: Podiatry

## 2024-04-18 ENCOUNTER — Encounter: Payer: Self-pay | Admitting: Podiatry

## 2024-04-18 ENCOUNTER — Encounter
Admission: RE | Disposition: A | Discharge status: Home / Self Care | Payer: Self-pay | Source: Home / Self Care | Attending: Podiatry

## 2024-04-18 ENCOUNTER — Ambulatory Visit: Payer: Self-pay | Admitting: Urgent Care

## 2024-04-18 ENCOUNTER — Ambulatory Visit: Admitting: Anesthesiology

## 2024-04-18 ENCOUNTER — Other Ambulatory Visit: Payer: Self-pay

## 2024-04-18 DIAGNOSIS — I110 Hypertensive heart disease with heart failure: Secondary | ICD-10-CM | POA: Diagnosis not present

## 2024-04-18 DIAGNOSIS — I2510 Atherosclerotic heart disease of native coronary artery without angina pectoris: Secondary | ICD-10-CM | POA: Diagnosis not present

## 2024-04-18 DIAGNOSIS — E1169 Type 2 diabetes mellitus with other specified complication: Secondary | ICD-10-CM | POA: Diagnosis not present

## 2024-04-18 DIAGNOSIS — I252 Old myocardial infarction: Secondary | ICD-10-CM | POA: Diagnosis not present

## 2024-04-18 DIAGNOSIS — M86172 Other acute osteomyelitis, left ankle and foot: Secondary | ICD-10-CM | POA: Diagnosis present

## 2024-04-18 DIAGNOSIS — E11621 Type 2 diabetes mellitus with foot ulcer: Secondary | ICD-10-CM | POA: Insufficient documentation

## 2024-04-18 DIAGNOSIS — Z955 Presence of coronary angioplasty implant and graft: Secondary | ICD-10-CM | POA: Diagnosis not present

## 2024-04-18 DIAGNOSIS — Z01818 Encounter for other preprocedural examination: Secondary | ICD-10-CM

## 2024-04-18 DIAGNOSIS — I509 Heart failure, unspecified: Secondary | ICD-10-CM | POA: Diagnosis not present

## 2024-04-18 DIAGNOSIS — Z87891 Personal history of nicotine dependence: Secondary | ICD-10-CM | POA: Insufficient documentation

## 2024-04-18 DIAGNOSIS — E1151 Type 2 diabetes mellitus with diabetic peripheral angiopathy without gangrene: Secondary | ICD-10-CM | POA: Insufficient documentation

## 2024-04-18 DIAGNOSIS — Z951 Presence of aortocoronary bypass graft: Secondary | ICD-10-CM | POA: Diagnosis not present

## 2024-04-18 HISTORY — PX: AMPUTATION TOE: SHX6595

## 2024-04-18 LAB — GLUCOSE, CAPILLARY
Glucose-Capillary: 112 mg/dL — ABNORMAL HIGH (ref 70–99)
Glucose-Capillary: 109 mg/dL — ABNORMAL HIGH (ref 70–99)

## 2024-04-18 SURGERY — AMPUTATION, TOE
Anesthesia: General | Site: Toe | Laterality: Left

## 2024-04-18 MED ORDER — FENTANYL CITRATE (PF) 100 MCG/2ML IJ SOLN: 25.0000 ug | INTRAMUSCULAR | Status: DC | PRN

## 2024-04-18 MED ORDER — 0.9 % SODIUM CHLORIDE (POUR BTL) OPTIME
TOPICAL | Status: DC | PRN
  Administered 2024-04-18: 500 mL

## 2024-04-18 MED ORDER — BACITRACIN ZINC 500 UNIT/GM EX OINT
TOPICAL_OINTMENT | CUTANEOUS | Status: DC | PRN
  Administered 2024-04-18: 1 via TOPICAL

## 2024-04-18 MED ORDER — EPHEDRINE 5 MG/ML INJ
INTRAVENOUS | Status: AC
  Filled 2024-04-18: qty 5

## 2024-04-18 MED ORDER — CHLORHEXIDINE GLUCONATE 0.12 % MT SOLN
15.0000 mL | Freq: Once | OROMUCOSAL | Status: AC
  Administered 2024-04-18: 15 mL via OROMUCOSAL

## 2024-04-18 MED ORDER — CEFAZOLIN SODIUM-DEXTROSE 2-4 GM/100ML-% IV SOLN
INTRAVENOUS | Status: AC
  Filled 2024-04-18: qty 100

## 2024-04-18 MED ORDER — OXYCODONE HCL 5 MG PO TABS: 5.0000 mg | ORAL_TABLET | Freq: Once | ORAL | Status: DC | PRN

## 2024-04-18 MED ORDER — FENTANYL CITRATE (PF) 100 MCG/2ML IJ SOLN
INTRAMUSCULAR | Status: AC
  Filled 2024-04-18: qty 2

## 2024-04-18 MED ORDER — ONDANSETRON HCL 4 MG/2ML IJ SOLN
INTRAMUSCULAR | Status: AC
  Filled 2024-04-18: qty 2

## 2024-04-18 MED ORDER — VANCOMYCIN HCL 1000 MG IV SOLR
INTRAVENOUS | Status: AC
  Filled 2024-04-18: qty 20

## 2024-04-18 MED ORDER — PROPOFOL 500 MG/50ML IV EMUL
INTRAVENOUS | Status: DC | PRN
  Administered 2024-04-18: 75 ug/kg/min via INTRAVENOUS

## 2024-04-18 MED ORDER — FENTANYL CITRATE (PF) 100 MCG/2ML IJ SOLN
INTRAMUSCULAR | Status: DC | PRN
  Administered 2024-04-18 (×2): 25 ug via INTRAVENOUS

## 2024-04-18 MED ORDER — CHLORHEXIDINE GLUCONATE 0.12 % MT SOLN
OROMUCOSAL | Status: AC
  Filled 2024-04-18: qty 15

## 2024-04-18 MED ORDER — EPHEDRINE SULFATE-NACL 50-0.9 MG/10ML-% IV SOSY
PREFILLED_SYRINGE | INTRAVENOUS | Status: DC | PRN
  Administered 2024-04-18: 10 mg via INTRAVENOUS

## 2024-04-18 MED ORDER — LIDOCAINE HCL (CARDIAC) PF 100 MG/5ML IV SOSY
PREFILLED_SYRINGE | INTRAVENOUS | Status: DC | PRN
  Administered 2024-04-18: 60 mg via INTRAVENOUS

## 2024-04-18 MED ORDER — SODIUM CHLORIDE 0.9 % IV SOLN: INTRAVENOUS | Status: DC

## 2024-04-18 MED ORDER — ORAL CARE MOUTH RINSE: 15.0000 mL | Freq: Once | OROMUCOSAL | Status: AC

## 2024-04-18 MED ORDER — CEFAZOLIN SODIUM-DEXTROSE 2-4 GM/100ML-% IV SOLN
2.0000 g | INTRAVENOUS | Status: AC
  Administered 2024-04-18: 2 g via INTRAVENOUS

## 2024-04-18 MED ORDER — ONDANSETRON HCL 4 MG/2ML IJ SOLN
INTRAMUSCULAR | Status: DC | PRN
  Administered 2024-04-18: 4 mg via INTRAVENOUS

## 2024-04-18 MED ORDER — OXYCODONE HCL 5 MG/5ML PO SOLN: 5.0000 mg | Freq: Once | ORAL | Status: DC | PRN

## 2024-04-18 MED ORDER — BUPIVACAINE HCL 0.5 % IJ SOLN
INTRAMUSCULAR | Status: DC | PRN
  Administered 2024-04-18: 10 mL

## 2024-04-18 MED ORDER — BUPIVACAINE HCL (PF) 0.5 % IJ SOLN
INTRAMUSCULAR | Status: AC
  Filled 2024-04-18: qty 30

## 2024-04-18 MED ORDER — BACITRACIN ZINC 500 UNIT/GM EX OINT
TOPICAL_OINTMENT | CUTANEOUS | Status: AC
  Filled 2024-04-18: qty 28.35

## 2024-04-18 MED ORDER — PROPOFOL 1000 MG/100ML IV EMUL
INTRAVENOUS | Status: AC
  Filled 2024-04-18: qty 100

## 2024-04-18 SURGICAL SUPPLY — 41 items
BLADE MED AGGRESSIVE (BLADE) ×1 IMPLANT
BLADE OSC/SAGITTAL MD 5.5X18 (BLADE) IMPLANT
BLADE SURG 15 STRL LF DISP TIS (BLADE) IMPLANT
BLADE SURG MINI STRL (BLADE) IMPLANT
BNDG ELASTIC 4INX 5YD STR LF (GAUZE/BANDAGES/DRESSINGS) ×1 IMPLANT
BNDG ESMARCH 4X12 STRL LF (GAUZE/BANDAGES/DRESSINGS) ×1 IMPLANT
BNDG GAUZE DERMACEA FLUFF 4 (GAUZE/BANDAGES/DRESSINGS) ×1 IMPLANT
CNTNR URN SCR LID CUP LEK RST (MISCELLANEOUS) ×1 IMPLANT
CUFF TOURN SGL QUICK 12 (TOURNIQUET CUFF) IMPLANT
CUFF TOURN SGL QUICK 18X4 (TOURNIQUET CUFF) IMPLANT
DRAPE FLUOR MINI C-ARM 54X84 (DRAPES) IMPLANT
DURAPREP 26ML APPLICATOR (WOUND CARE) ×1 IMPLANT
ELECTRODE REM PT RTRN 9FT ADLT (ELECTROSURGICAL) ×1 IMPLANT
GAUZE SPONGE 4X4 12PLY STRL (GAUZE/BANDAGES/DRESSINGS) ×2 IMPLANT
GAUZE STRETCH 2X75IN STRL (MISCELLANEOUS) IMPLANT
GAUZE XEROFORM 1X8 LF (GAUZE/BANDAGES/DRESSINGS) ×1 IMPLANT
GLOVE BIO SURGEON STRL SZ7 (GLOVE) ×1 IMPLANT
GLOVE INDICATOR 7.0 STRL GRN (GLOVE) ×1 IMPLANT
GOWN STRL REUS W/ TWL LRG LVL3 (GOWN DISPOSABLE) ×2 IMPLANT
HANDPIECE VERSAJET DEBRIDEMENT (MISCELLANEOUS) IMPLANT
KIT TURNOVER KIT A (KITS) ×1 IMPLANT
LABEL OR SOLS (LABEL) ×1 IMPLANT
MANIFOLD NEPTUNE II (INSTRUMENTS) ×1 IMPLANT
NDL FILTER BLUNT 18X1 1/2 (NEEDLE) ×1 IMPLANT
NDL HYPO 25X1 1.5 SAFETY (NEEDLE) ×2 IMPLANT
NEEDLE FILTER BLUNT 18X1 1/2 (NEEDLE) ×1 IMPLANT
NEEDLE HYPO 25X1 1.5 SAFETY (NEEDLE) ×2 IMPLANT
NS IRRIG 500ML POUR BTL (IV SOLUTION) ×1 IMPLANT
PACK EXTREMITY ARMC (MISCELLANEOUS) ×1 IMPLANT
PENCIL SMOKE EVACUATOR (MISCELLANEOUS) ×1 IMPLANT
SOL .9 NS 3000ML IRR UROMATIC (IV SOLUTION) IMPLANT
SOLUTION PREP PVP 2OZ (MISCELLANEOUS) ×1 IMPLANT
STOCKINETTE 48X4 2 PLY STRL (GAUZE/BANDAGES/DRESSINGS) IMPLANT
STOCKINETTE STRL 4IN 9604848 (GAUZE/BANDAGES/DRESSINGS) IMPLANT
STOCKINETTE STRL 6IN 960660 (GAUZE/BANDAGES/DRESSINGS) ×1 IMPLANT
SUT VIC AB 3-0 SH 27X BRD (SUTURE) ×1 IMPLANT
SUTURE EHLN 3-0 FS-10 30 BLK (SUTURE) ×2 IMPLANT
SWAB CULTURE AMIES ANAERIB BLU (MISCELLANEOUS) IMPLANT
SYR 10ML LL (SYRINGE) ×1 IMPLANT
TRAP FLUID SMOKE EVACUATOR (MISCELLANEOUS) ×1 IMPLANT
WATER STERILE IRR 500ML POUR (IV SOLUTION) ×1 IMPLANT

## 2024-04-18 NOTE — H&P (Signed)
 HISTORY AND PHYSICAL INTERVAL NOTE:  04/18/2024  12:09 PM  Kenneth Duarte  has presented today for surgery, with the diagnosis of Type 2 diabetes mellitus with diabetic polyneuropathy, unspecified whether long term insulin  use Peripheral vascular disease, unspecified Cellulitis of third toe of left foot Skin ulcer of foot, left, with necrosis of bone Acute osteomyelitis of toe, left.  The various methods of treatment have been discussed with the patient.  No guarantees were given.  After consideration of risks, benefits and other options for treatment, the patient has consented to surgery.  I have reviewed the patients' chart and labs.    PROCEDURE: LEFT 3RD TOE PARTIAL AMPUTATION  A history and physical examination was performed in my office.  The patient was reexamined.  There have been no changes to this history and physical examination.  Pink Bridges, DPM

## 2024-04-18 NOTE — Discharge Instructions (Signed)
 [facility]  POST OPERATIVE INSTRUCTIONS FOR DR. Althea Atkinson AND DR. Jahlil Ziller [facility] CLINIC PODIATRY DEPARTMENT   Take your medication as prescribed.  Pain medication should be taken only as needed.  Continue taking antibiotics as prescribed until gone.  Keep the dressing clean, dry and intact.  Keep your foot elevated above the heart level for the first 48 hours.  Continue elevation thereafter to improve swelling.  Try to stay off of the left foot is much as possible over the next 2 weeks.  Continue ambulation in the postop shoe for short distances.  Walking to the bathroom and brief periods of walking are acceptable, unless we have instructed you to be non-weight bearing.  Always wear your post-op shoe when walking.  Always use your crutches if you are to be non-weight bearing.  Do not take a shower. Baths are permissible as long as the foot is kept out of the water .   Every hour you are awake:  Bend your knee 15 times. Flex foot 15 times Massage calf 15 times  Call [facility] [phone]) if any of the following problems occur: You develop a temperature or fever. The bandage becomes saturated with blood. Medication does not stop your pain. Injury of the foot occurs. Any symptoms of infection including redness, odor, or red streaks running from wound.

## 2024-04-18 NOTE — Op Note (Signed)
 PODIATRY / FOOT AND ANKLE SURGERY OPERATIVE REPORT    SURGEON: Pink Bridges, DPM  PRE-OPERATIVE DIAGNOSIS:  1.  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  Dermal ulceration present to left great toe 3.  Diabetes type 2 with neuropathy 4.  PVD  POST-OPERATIVE DIAGNOSIS: Same  PROCEDURE(S): Left third toe partial amputation 100% excisional dermal wound debridement left great toe  HEMOSTASIS: Left ankle tourniquet, 6 minutes  ANESTHESIA: MAC  ESTIMATED BLOOD LOSS: 20 cc  FINDING(S): 1.  Bone exposed left third toe, osteomyelitis present DIPJ  PATHOLOGY/SPECIMEN(S): Left third toe bone culture, the path specimen with proximal margin margin purpling  INDICATIONS:   Kenneth Duarte is a 78 y.o. male who presents with a nonhealing ulceration of the left third toe as well as an ulcer to the left great toe.  Patient appeared to have bone exposed to the left third toe in clinic.  He had ABIs performed prior to today's intervention which showed adequate blood flow for healing to the left lower extremity.  All treatment options were discussed with the patient both conservative and surgical attempts at correction include potential risks and complications at this time patient is elected for surgical intervention today consisting of left partial third toe amputation.  No guarantees given..  DESCRIPTION: After obtaining full informed written consent, the patient was brought back to the operating room and placed supine upon the operating table.  The patient received IV antibiotics prior to induction.  After obtaining adequate anesthesia, the patient was prepped and draped in the standard fashion.  10 cc of absent Marcaine  plain was injected about the left third ray.  An Esmarch bandage was used to exsanguinate left lower extremity the pneumatic ankle tourniquet was inflated.  Attention was directed the left third toe at the PIPJ where an elliptical incision was made  over the joint dorsally and plantarly.  At this time the incision was made straight to bone.  The extensor tendon overlying the PIPJ was identified and incised followed by release the collateral ligaments as well as any connection to the flexor tendon.  At this time the middle and distal phalanx were disarticulated and passed off the operative site.  The proximal margin was marked in purple ink.  A bone biopsy was also taken from the area of the ulceration at the DIPJ and passed off for bone culture.  Some bleeding occurred vessels were cauterized with electrocauterization.  The pneumatic ankle tourniquet was deflated and a prompt hyperemic response noted all digits the left foot.  Hemostasis appeared to be well under control.  At this time the head of the proximal phalanx was also resected and passed off in the operative site along with some of the flexor and extensor tendon.  The surgical site was flushed with copious amounts normal sterile saline.  This bone in this area appeared to be hard and healthy and viable with no signs of infection.  The skin was then reapproximated well coapted with 3-0 nylon in simple type stitching.  Attention was then directed to the left hallux where wound was measured to be approximately 1.5 x 1 cm by dermis, wound debridement was performed, 100% excisional with 15 blade, hemostasis achieved with compression.  Wound bed appeared to be healthy and granular.  Predebridement measurement was same as postdebridement.  Applied Xeroform to the incisional area, bacitracin  to the wound followed by Xeroform, 4 x 4 gauze, Kerlix, Ace wrap.  The patient tolerated the procedure and anesthesia well  and was transferred to recovery with vital signs stable vascular status intact all toes left foot.  Patient should continue taking Augmentin  as prescribed until gone.  Patient should remain partial weightbearing in postoperative shoe and should stay off foot is much as possible and keep dressing  clean, dry, and intact until postop visit.  Patient to be discharged with follow-up in 1 week in outpatient clinic.  COMPLICATIONS: None  CONDITION: Good, stable  Pink Bridges, DPM

## 2024-04-18 NOTE — Anesthesia Postprocedure Evaluation (Signed)
 Anesthesia Post Note  Patient: Kenneth Duarte  Procedure(s) Performed: AMPUTATION, TOE, THIRD (Left: Toe)  Patient location during evaluation: PACU Anesthesia Type: General Level of consciousness: awake and alert Pain management: pain level controlled Vital Signs Assessment: post-procedure vital signs reviewed and stable Respiratory status: spontaneous breathing, nonlabored ventilation, respiratory function stable and patient connected to nasal cannula oxygen Cardiovascular status: blood pressure returned to baseline and stable Postop Assessment: no apparent nausea or vomiting Anesthetic complications: no   No notable events documented.   Last Vitals:  Vitals:   04/18/24 1300 04/18/24 1315  BP: 134/62 130/87  Pulse: 60 62  Resp: 18 13  Temp:  36.7 C  SpO2: 98% 100%    Last Pain:  Vitals:   04/18/24 1315  TempSrc:   PainSc: 0-No pain                 Portia Brittle Nevaeha Finerty

## 2024-04-18 NOTE — Anesthesia Preprocedure Evaluation (Signed)
 Anesthesia Evaluation  Patient identified by MRN, date of birth, ID band Patient awake    Reviewed: Allergy & Precautions, NPO status , Patient's Chart, lab work & pertinent test results  History of Anesthesia Complications Negative for: history of anesthetic complications  Airway Mallampati: III  TM Distance: <3 FB Neck ROM: full    Dental  (+) Chipped, Poor Dentition, Missing   Pulmonary shortness of breath and with exertion, COPD, former smoker   Pulmonary exam normal        Cardiovascular Exercise Tolerance: Good hypertension, (-) angina + CAD, + Past MI, + Cardiac Stents, + CABG, + Peripheral Vascular Disease and +CHF  Normal cardiovascular exam     Neuro/Psych  Headaches  Neuromuscular disease  negative psych ROS   GI/Hepatic Neg liver ROS,GERD  Controlled,,  Endo/Other  diabetes, Type 2    Renal/GU Renal disease  negative genitourinary   Musculoskeletal   Abdominal   Peds  Hematology negative hematology ROS (+)   Anesthesia Other Findings Past Medical History: No date: Arthritis No date: Atherosclerosis of both carotid arteries No date: Benign neoplasm of sigmoid colon No date: Bilateral leg edema No date: Cardiomyopathy (HCC) No date: Cellulitis in diabetic foot (HCC) No date: Chronic pain syndrome 1980: Congestive heart failure (HCC) No date: COPD (chronic obstructive pulmonary disease) (HCC) No date: Coronary artery disease     Comment:  a. s/p 3V CABG (03/2017). b. NSTEMI 06/2017 with               progressive graft disease, s/p DES to SVG-OM. 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 04/17/2024: MSSA (methicillin susceptible Staphylococcus aureus) No date: Neuropathy of both feet No date: NSTEMI (non-ST elevated myocardial infarction) (HCC) No date: Osteomyelitis of foot (HCC) No date: S/P amputation of lesser toe, left (HCC) No date: S/P CABG x 3 No date: Seborrheic keratosis No date: Sinus congestion No date: Stable angina (HCC) No date: Vertigo No date: Wears dentures     Comment:  PARTIAL UPPER  Past Surgical History: 10/25/2015: COLONOSCOPY WITH PROPOFOL ; N/A     Comment:  Procedure: COLONOSCOPY WITH PROPOFOL ;  Surgeon: Marnee Sink, MD;  Location: [facility];  Service:               Endoscopy;  Laterality: N/A;  DIABETIC-ORAL MEDS 10/22/2020: COLONOSCOPY WITH PROPOFOL ; N/A     Comment:  Procedure: COLONOSCOPY CANCELED;  Surgeon: Marnee Sink,              MD;  Location: [facility];  Service: Endoscopy;               Laterality: N/A;  procedure aborted poor prep 10/25/2020: COLONOSCOPY WITH PROPOFOL ; N/A     Comment:  Procedure: COLONOSCOPY WITH PROPOFOL ;  Surgeon: Marnee Sink, MD;  Location: [facility];  Service:               Endoscopy;  Laterality: N/A; 04/10/2017: CORONARY ARTERY BYPASS GRAFT; N/A     Comment:  Procedure: CORONARY ARTERY BYPASS  GRAFTING (CABG) x               three , using left internal mammary artery and right leg               greater saphenous vein harvested endoscopically;                Surgeon: Heriberto London, MD;  Location: [facility] OR;  Service:              Open Heart Surgery;  Laterality: N/A; No date: CORONARY ARTERY BYPASS GRAFT 06/19/2017: CORONARY STENT INTERVENTION; N/A     Comment:  Procedure: Coronary Stent Intervention;  Surgeon:               Lucendia Rusk, MD;  Location: [facility];               Service: Cardiovascular;  Laterality: N/A; 10/22/2020: ESOPHAGOGASTRODUODENOSCOPY (EGD) WITH PROPOFOL ; N/A     Comment:  Procedure:  ESOPHAGOGASTRODUODENOSCOPY (EGD) WITH               PROPOFOL ;  Surgeon: Marnee Sink, MD;  Location: [facility];  Service: Endoscopy;  Laterality: N/A;                Diabetic - oral meds 03/22/2023: EXCISION PARTIAL PHALANX; Right     Comment:  Procedure: EXCISION  PHALANX;  Surgeon: Pink Bridges,               DPM;  Location: [facility] ORS;  Service: Podiatry;                Laterality: Right; 04/19/2023: INCISION AND DRAINAGE; Right     Comment:  Procedure: INCISION AND DRAINAGE;  Surgeon: Angel Barba,              DPM;  Location: [facility];  Service: Podiatry;                Laterality: Right; 03/22/2023: IRRIGATION AND DEBRIDEMENT FOOT; Right     Comment:  Procedure: IRRIGATION AND DEBRIDEMENT FOOT;  Surgeon:               Pink Bridges, DPM;  Location: [facility];  Service:               Podiatry;  Laterality: Right; No date: KNEE SURGERY; Right     Comment:  over 20 years ago 02/22/2017: LEFT HEART CATH AND CORONARY ANGIOGRAPHY; Left     Comment:  Procedure: Left Heart Cath and Coronary Angiography;                Surgeon: Michelle Aid, MD;  Location: [facility]               CV [facility];  Service: Cardiovascular;  Laterality: Left; 06/18/2017: LEFT HEART CATH AND CORS/GRAFTS ANGIOGRAPHY; N/A     Comment:  Procedure: Left Heart Cath and Cors/Grafts Angiography               and PCI;  Surgeon: Percival Brace, MD;  Location:               [facility];  Service: Cardiovascular;                Laterality: N/A; 03/23/2023: LOWER EXTREMITY ANGIOGRAPHY; Right     Comment:  Procedure: Lower Extremity Angiography;  Surgeon: Vonna Guardian,  Donald Frost, MD;  Location: [facility];  Service:               Cardiovascular;  Laterality: Right; 03/23/2023: LOWER EXTREMITY INTERVENTION     Comment:  Procedure: LOWER EXTREMITY INTERVENTION;  Surgeon: Celso College, MD;  Location: [facility];  Service:               Cardiovascular;; 10/25/2015:  POLYPECTOMY     Comment:  Procedure: POLYPECTOMY;  Surgeon: Marnee Sink, MD;                Location: [facility];  Service: Endoscopy;; 08/31/2023: RIGHT/LEFT HEART CATH AND CORONARY/GRAFT ANGIOGRAPHY;  Bilateral     Comment:  Procedure: RIGHT/LEFT HEART CATH AND CORONARY/GRAFT               ANGIOGRAPHY;  Surgeon: Wenona Hamilton, MD;  Location:               [facility];  Service: Cardiovascular;                Laterality: Bilateral; 2012: ROTATOR CUFF REPAIR; Right 04/10/2017: TEE WITHOUT CARDIOVERSION; N/A     Comment:  Procedure: TRANSESOPHAGEAL ECHOCARDIOGRAM (TEE);                Surgeon: Heriberto London, MD;  Location: [facility] OR;  Service:              Open Heart Surgery;  Laterality: N/A;  BMI    Body Mass Index: 25.07 kg/m      Reproductive/Obstetrics negative OB ROS                             Anesthesia Physical Anesthesia Plan  ASA: 3  Anesthesia Plan: General   Post-op Pain Management:    Induction: Intravenous  PONV Risk Score and Plan: Propofol  infusion and TIVA  Airway Management Planned: Natural Airway and Nasal Cannula  Additional Equipment:   Intra-op Plan:   Post-operative Plan:   Informed Consent: I have reviewed the patients History and Physical, chart, labs and discussed the procedure including the risks, benefits and alternatives for the proposed anesthesia with the patient or authorized representative who has indicated his/her understanding and acceptance.     Dental Advisory Given  Plan Discussed with: Anesthesiologist, CRNA and Surgeon  Anesthesia Plan Comments: (Patient consented for risks of anesthesia including but not limited to:  - adverse reactions to medications - risk of airway placement if required - damage to eyes, teeth, lips or other oral mucosa - nerve damage due to positioning  - sore throat or hoarseness - Damage to heart, brain, nerves, lungs, other parts of body or loss of  life  Patient voiced understanding and assent.)       Anesthesia Quick Evaluation

## 2024-04-18 NOTE — Transfer of Care (Signed)
 Immediate Anesthesia Transfer of Care Note  Patient: Kenneth Duarte  Procedure(s) Performed: AMPUTATION, TOE, THIRD (Left: Toe)  Patient Location: PACU  Anesthesia Type:General  Level of Consciousness: awake and alert   Airway & Oxygen Therapy: Patient Spontanous Breathing and Patient connected to face mask oxygen  Post-op Assessment: Report given to RN and Post -op Vital signs reviewed and stable  Post vital signs: Reviewed and stable  Last Vitals:  Vitals Value Taken Time  BP 132/63 04/18/24 1255  Temp    Pulse 59 04/18/24 1257  Resp 16 04/18/24 1257  SpO2 100 % 04/18/24 1257  Vitals shown include unfiled device data.  Last Pain:  Vitals:   04/18/24 1104  TempSrc: Temporal  PainSc: 0-No pain         Complications: No notable events documented.

## 2024-04-21 ENCOUNTER — Encounter: Payer: Self-pay | Admitting: Podiatry

## 2024-04-22 LAB — SURGICAL PATHOLOGY

## 2024-04-23 ENCOUNTER — Encounter (INDEPENDENT_AMBULATORY_CARE_PROVIDER_SITE_OTHER)

## 2024-04-23 LAB — AEROBIC/ANAEROBIC CULTURE W GRAM STAIN (SURGICAL/DEEP WOUND): Gram Stain: NONE SEEN

## 2024-04-25 ENCOUNTER — Ambulatory Visit (INDEPENDENT_AMBULATORY_CARE_PROVIDER_SITE_OTHER): Admitting: Vascular Surgery

## 2024-04-25 ENCOUNTER — Encounter (INDEPENDENT_AMBULATORY_CARE_PROVIDER_SITE_OTHER): Payer: Self-pay | Admitting: Vascular Surgery

## 2024-04-25 ENCOUNTER — Encounter: Payer: Self-pay | Admitting: Oncology

## 2024-04-25 VITALS — BP 147/74 | HR 60 | Resp 18 | Ht 67.0 in | Wt 193.6 lb

## 2024-04-25 DIAGNOSIS — I10 Essential (primary) hypertension: Secondary | ICD-10-CM

## 2024-04-25 DIAGNOSIS — I7025 Atherosclerosis of native arteries of other extremities with ulceration: Secondary | ICD-10-CM | POA: Diagnosis not present

## 2024-04-25 DIAGNOSIS — L97509 Non-pressure chronic ulcer of other part of unspecified foot with unspecified severity: Secondary | ICD-10-CM | POA: Diagnosis not present

## 2024-04-25 DIAGNOSIS — E11621 Type 2 diabetes mellitus with foot ulcer: Secondary | ICD-10-CM | POA: Diagnosis not present

## 2024-04-25 NOTE — Assessment & Plan Note (Signed)
 Noninvasive studies performed recently showed noncompressible vessels but good waveforms and digital flow bilaterally.  This is encouraging and he should have adequate flow for wound healing.  We will keep a fairly close follow-up with his infection and wound.  Follow-up in 2 to 3 months with ABIs.

## 2024-04-25 NOTE — Assessment & Plan Note (Signed)
 blood glucose control important in reducing the progression of atherosclerotic disease. Also, involved in wound healing. On appropriate medications.

## 2024-04-25 NOTE — Assessment & Plan Note (Signed)
 blood pressure control important in reducing the progression of atherosclerotic disease. On appropriate oral medications.

## 2024-04-25 NOTE — Progress Notes (Signed)
 MRN : [id number]  Kenneth Duarte is a 78 y.o. ([birth year]) male who presents with chief complaint of  Chief Complaint  Patient presents with   Follow-up    6 month ABI/LEA  .  History of Present Illness: Patient returns today in follow up of PAD with ulceration of the left lower extremity.  1 week ago, he underwent a left third partial toe amputation and some debridement of a dermal wound of the left great toe by his podiatrist.  He actually saw his podiatrist this morning who saw the wound and reported they were doing quite well and redressed the wounds we did not remove his dressing today.  He is having minimal pain.  No rest pain or claudication symptoms.  Noninvasive studies performed recently showed noncompressible vessels but good waveforms and digital flow bilaterally.  Current Outpatient Medications  Medication Sig Dispense Refill   aspirin  81 MG tablet Take 1 tablet (81 mg total) by mouth daily. 90 tablet 1   atorvastatin  (LIPITOR ) 80 MG tablet TAKE 1 TABLET AT BEDTIME 90 tablet 3   carvedilol  (COREG ) 3.125 MG tablet Take 3.125 mg by mouth 2 (two) times daily.     clopidogrel  (PLAVIX ) 75 MG tablet TAKE 1 TABLET EVERY DAY 90 tablet 3   dapagliflozin  propanediol (FARXIGA ) 10 MG TABS tablet Take 1 tablet (10 mg total) by mouth daily. 90 tablet 3   DENTA 5000 PLUS 1.1 % CREA dental cream Place 1 Application onto teeth at bedtime.     ENTRESTO  24-26 MG TAKE 1 TABLET TWICE DAILY 180 tablet 3   gabapentin  (NEURONTIN ) 800 MG tablet Take 1,600 mg by mouth 3 (three) times daily.     hydrALAZINE  (APRESOLINE ) 25 MG tablet Take 1 tablet (25 mg total) by mouth 3 (three) times daily as needed. For blood pressure greater then 160. (Patient taking differently: Take 25 mg by mouth 3 (three) times daily as needed (For blood pressure greater then 160.).) 270 tablet 3   meclizine  (ANTIVERT ) 25 MG tablet TAKE 1 TABLET TWICE DAILY AS NEEDED FOR DIZZINESS (Patient taking differently: Take 25 mg by  mouth 2 (two) times daily.) 180 tablet 3   metFORMIN  (GLUCOPHAGE -XR) 500 MG 24 hr tablet Take 500 mg by mouth daily.     nitroGLYCERIN  (NITROSTAT ) 0.4 MG SL tablet Place 1 tablet (0.4 mg total) under the tongue every 5 (five) minutes as needed for chest pain. 25 tablet 3   oxyCODONE -acetaminophen  (PERCOCET) 10-325 MG tablet Take 1 tablet by mouth See admin instructions. Every 3-4 hours     pantoprazole  (PROTONIX ) 40 MG tablet TAKE 1 TABLET EVERY DAY (Patient taking differently: 40 mg every morning.) 90 tablet 3   vitamin B-12 (CYANOCOBALAMIN ) 1000 MCG tablet Take 1,000 mcg by mouth daily.     VITAMIN D -1000 MAX ST 25 MCG (1000 UT) tablet Take 1,000 Units by mouth daily.     No current facility-administered medications for this visit.    Past Medical History:  Diagnosis Date   Arthritis    Atherosclerosis of both carotid arteries    Benign neoplasm of sigmoid colon    Bilateral leg edema    Cardiomyopathy (HCC)    Cellulitis in diabetic foot (HCC)    Chronic pain syndrome    Congestive heart failure (HCC) 1980   COPD (chronic obstructive pulmonary disease) (HCC)    Coronary artery disease    a. s/p 3V CABG (03/2017). b. NSTEMI 06/2017 with progressive graft disease, s/p DES to SVG-OM.  DM (diabetes mellitus), type 2 (HCC)    Dyspnea    with exertion   ED (erectile dysfunction)    GERD (gastroesophageal reflux disease)    Gout    Headache    Hyperlipemia    Hypertension    CONTROLLED ON MEDS   IDA (iron  deficiency anemia) 10/21/2019   MRSA nasal colonization 2018   MSSA (methicillin susceptible Staphylococcus aureus) 04/17/2024   Neuropathy of both feet    NSTEMI (non-ST elevated myocardial infarction) (HCC)    Osteomyelitis of foot (HCC)    S/P amputation of lesser toe, left (HCC)    S/P CABG x 3    Seborrheic keratosis    Sinus congestion    Stable angina (HCC)    Vertigo    Wears dentures    PARTIAL UPPER    Past Surgical History:  Procedure Laterality Date    AMPUTATION TOE Left 04/18/2024   Procedure: AMPUTATION, TOE, THIRD;  Surgeon: Pink Bridges, DPM;  Location: [facility] OR[facility];  Service: Orthopedics/Podiatry;  Laterality: Left;   COLONOSCOPY WITH PROPOFOL  N/A 10/25/2015   Procedure: COLONOSCOPY WITH PROPOFOL ;  Surgeon: Marnee Sink, MD;  Location: [facility];  Service: Endoscopy;  Laterality: N/A;  DIABETIC-ORAL MEDS   COLONOSCOPY WITH PROPOFOL  N/A 10/22/2020   Procedure: COLONOSCOPY CANCELED;  Surgeon: Marnee Sink, MD;  Location: [facility];  Service: Endoscopy;  Laterality: N/A;  procedure aborted poor prep   COLONOSCOPY WITH PROPOFOL  N/A 10/25/2020   Procedure: COLONOSCOPY WITH PROPOFOL ;  Surgeon: Marnee Sink, MD;  Location: [facility];  Service: Endoscopy;  Laterality: N/A;   CORONARY ARTERY BYPASS GRAFT N/A 04/10/2017   Procedure: CORONARY ARTERY BYPASS GRAFTING (CABG) x three , using left internal mammary artery and right leg greater saphenous vein harvested endoscopically;  Surgeon: Heriberto London, MD;  Location: [facility] OR;  Service: Open Heart Surgery;  Laterality: N/A;   CORONARY ARTERY BYPASS GRAFT     CORONARY STENT INTERVENTION N/A 06/19/2017   Procedure: Coronary Stent Intervention;  Surgeon: Lucendia Rusk, MD;  Location: [facility] INVASIVE CV LAB;  Service: Cardiovascular;  Laterality: N/A;   ESOPHAGOGASTRODUODENOSCOPY (EGD) WITH PROPOFOL  N/A 10/22/2020   Procedure: ESOPHAGOGASTRODUODENOSCOPY (EGD) WITH PROPOFOL ;  Surgeon: Marnee Sink, MD;  Location: [facility];  Service: Endoscopy;  Laterality: N/A;  Diabetic - oral meds   EXCISION PARTIAL PHALANX Right 03/22/2023   Procedure: EXCISION  PHALANX;  Surgeon: Pink Bridges, DPM;  Location: [facility];  Service: Podiatry;  Laterality: Right;   INCISION AND DRAINAGE Right 04/19/2023   Procedure: INCISION AND DRAINAGE;  Surgeon: Angel Barba, DPM;  Location: [facility];  Service: Podiatry;  Laterality: Right;   IRRIGATION AND DEBRIDEMENT FOOT Right 03/22/2023   Procedure:  IRRIGATION AND DEBRIDEMENT FOOT;  Surgeon: Pink Bridges, DPM;  Location: [facility];  Service: Podiatry;  Laterality: Right;   KNEE SURGERY Right    over 20 years ago   LEFT HEART CATH AND CORONARY ANGIOGRAPHY Left 02/22/2017   Procedure: Left Heart Cath and Coronary Angiography;  Surgeon: Michelle Aid, MD;  Location: [facility] INVASIVE CV LAB;  Service: Cardiovascular;  Laterality: Left;   LEFT HEART CATH AND CORS/GRAFTS ANGIOGRAPHY N/A 06/18/2017   Procedure: Left Heart Cath and Cors/Grafts Angiography and PCI;  Surgeon: Percival Brace, MD;  Location: [facility] INVASIVE CV LAB;  Service: Cardiovascular;  Laterality: N/A;   LOWER EXTREMITY ANGIOGRAPHY Right 03/23/2023   Procedure: Lower Extremity Angiography;  Surgeon: Celso College, MD;  Location: [facility] INVAS[facility] CV LAB;  Service: Cardiovascular;  Laterality:  Right;   LOWER EXTREMITY INTERVENTION  03/23/2023   Procedure: LOWER EXTREMITY INTERVENTION;  Surgeon: Celso College, MD;  Location: [facility] INVAS[facility] CV LAB;  Service: Cardiovascular;;   POLYPECTOMY  10/25/2015   Procedure: POLYPECTOMY;  Surgeon: Marnee Sink, MD;  Location: [facility];  Service: Endoscopy;;   RIGHT/LEFT HEART CATH AND CORONARY/GRAFT ANGIOGRAPHY Bilateral 08/31/2023   Procedure: RIGHT/LEFT HEART CATH AND CORONARY/GRAFT ANGIOGRAPHY;  Surgeon: Wenona Hamilton, MD;  Location: [facility] INVASIVE CV LAB;  Service: Cardiovascular;  Laterality: Bilateral;   ROTATOR CUFF REPAIR Right 2012   TEE WITHOUT CARDIOVERSION N/A 04/10/2017   Procedure: TRANSESOPHAGEAL ECHOCARDIOGRAM (TEE);  Surgeon: Heriberto London, MD;  Location: [facility] OR;  Service: Open Heart Surgery;  Laterality: N/A;     Social History   Tobacco Use   Smoking status: Former    Current packs/day: 0.00    Average packs/day: 2.0 packs/day for 15.0 years (30.0 ttl pk-yrs)    Types: Cigarettes    Start date: 06/29/1964    Quit date: 06/30/1979    Years since quitting: 44.8   Smokeless tobacco: Never  Vaping Use   Vaping status:  Never Used  Substance Use Topics   Alcohol use: No    Alcohol/week: 0.0 standard drinks of alcohol    Comment: QUIT IN 1980   Drug use: No       Family History  Problem Relation Age of Onset   Cancer Mother    Cancer Father    Heart disease Father    Heart attack Father    Diabetes Sister    Heart disease Brother    Diabetes Brother    Prostate cancer Brother    Bladder Cancer Neg Hx    Kidney disease Neg Hx      Allergies  Allergen Reactions   Blood-Group Specific Substance Other (See Comments)    Patient received almost 2 units FFP during a procedure. Became hypotensive and developed rash   Cortisone Acetate [Cortisone] Rash and Other (See Comments)    The patient feels that he can take this, he not sure why this was put on his file     REVIEW OF SYSTEMS (Negative unless checked)  Constitutional: [] Weight loss  [] Fever  [] Chills Cardiac: [] Chest pain   [] Chest pressure   [] Palpitations   [] Shortness of breath when laying flat   [] Shortness of breath at rest   [x] Shortness of breath with exertion. Vascular:  [] Pain in legs with walking   [] Pain in legs at rest   [] Pain in legs when laying flat   [] Claudication   [] Pain in feet when walking  [] Pain in feet at rest  [] Pain in feet when laying flat   [] History of DVT   [] Phlebitis   [] Swelling in legs   [] Varicose veins   [] Non-healing ulcers Pulmonary:   [] Uses home oxygen   [] Productive cough   [] Hemoptysis   [] Wheeze  [x] COPD   [] Asthma Neurologic:  [] Dizziness  [] Blackouts   [] Seizures   [] History of stroke   [] History of TIA  [] Aphasia   [] Temporary blindness   [] Dysphagia   [] Weakness or numbness in arms   [] Weakness or numbness in legs Musculoskeletal:  [x] Arthritis   [] Joint swelling   [] Joint pain   [] Low back pain Hematologic:  [] Easy bruising  [] Easy bleeding   [] Hypercoagulable state   [] Anemic   Gastrointestinal:  [] Blood in stool   [] Vomiting blood  [x] Gastroesophageal reflux/heartburn   [] Abdominal  pain Genitourinary:  [] Chronic kidney disease   [] Difficult  urination  [] Frequent urination  [] Burning with urination   [] Hematuria Skin:  [] Rashes   [x] Ulcers   [x] Wounds Psychological:  [] History of anxiety   []  History of major depression.  Physical Examination  BP (!) 147/74   Pulse 60   Resp 18   Ht 5\' 7"  (1.702 m)   Wt 193 lb 9.6 oz (87.8 kg)   BMI 30.32 kg/m  Gen:  WD/WN, NAD Head: [place]/AT, No temporalis wasting. Ear/Nose/Throat: Hearing grossly intact, nares w/o erythema or drainage Eyes: Conjunctiva clear. Sclera non-icteric Neck: Supple.  Trachea midline Pulmonary:  Good air movement, no use of accessory muscles.  Cardiac: RRR, no JVD Vascular:  Vessel Right Left  Radial Palpable Palpable                          PT 1+ Palpable 1+ Palpable  DP 1+ Palpable 1+ Palpable   Gastrointestinal: soft, non-tender/non-distended. No guarding/reflex.  Musculoskeletal: M/S 5/5 throughout.  No deformity or atrophy.  Left foot is currently dressed after seeing his podiatrist earlier today.  This was not taken down. Neurologic: Sensation grossly intact in extremities.  Symmetrical.  Speech is fluent.  Psychiatric: Judgment intact, Mood & affect appropriate for pt's clinical situation. Dermatologic: Left foot wounds dressed as above.      Labs Recent Results (from the past 2160 hours)  VAS US  ABI WITH/WO TBI     Status: None   Collection Time: 04/14/24  3:25 PM  Result Value Ref Range   Right ABI >1.0 [place]    Left ABI >1.0 [place]   CBC     Status: Abnormal   Collection Time: 04/17/24 12:54 PM  Result Value Ref Range   WBC 7.0 4.0 - 10.5 K/uL   RBC 4.17 (L) 4.22 - 5.81 MIL/uL   Hemoglobin 11.8 (L) 13.0 - 17.0 g/dL   HCT 16.1 (L) 09.6 - 04.5 %   MCV 87.5 80.0 - 100.0 fL   MCH 28.3 26.0 - 34.0 pg   MCHC 32.3 30.0 - 36.0 g/dL   RDW 40.9 (H) 81.1 - 91.4 %   Platelets 177 150 - 400 K/uL   nRBC 0.0 0.0 - 0.2 %    Comment: Performed at [facility], [address] [place], [place] [address]  Basic metabolic panel     Status: Abnormal   Collection Time: 04/17/24 12:54 PM  Result Value Ref Range   Sodium 135 135 - 145 mmol/L   Potassium 4.1 3.5 - 5.1 mmol/L   Chloride 103 98 - 111 mmol/L   CO2 23 22 - 32 mmol/L   Glucose, Bld 93 70 - 99 mg/dL    Comment: Glucose reference range applies only to samples taken after fasting for at least 8 hours.   BUN 20 8 - 23 mg/dL   Creatinine, Ser 6.21 (H) 0.61 - 1.24 mg/dL   Calcium  8.4 (L) 8.9 - 10.3 mg/dL   GFR, Estimated >30 >86 mL/min    Comment: (NOTE) Calculated using the CKD-EPI Creatinine Equation (2021)    Anion gap 9 5 - 15    Comment: Performed at [facility], [address], [place], [place] [address]  Surgical pathology     Status: None   Collection Time: 04/18/24 12:00 AM  Result Value Ref Range   SURGICAL PATHOLOGY      SURGICAL PATHOLOGY [facility] [address] [place], [place] [address] Telephone [phone] or [phone] Fax [phone]  REPORT OF SURGICAL PATHOLOGY   Accession #: [id number] Patient Name: Kenneth Duarte, Kenneth Duarte Visit # : [id number]  MRN: [id number] Physician: Pink Bridges DOB/Age 06-03-1946 (Age: 39) Gender: M Collected Date: 04/18/2024 Received Date: 04/21/2024  FINAL DIAGNOSIS       1. Toe(s), amputation, left third toe, proximal margin purple ink :       -  SKIN AND SUBCUTANEOUS SOFT TISSUE WITH ULCERATION AND GRANULATION WITH ACUTE      INFLAMMATION AND UNDERLYING BONE WITH SEROUS ATROPHY, NEGATIVE FOR ACUTE      OSTEOMYELITIS ON SECTIONS EXAMINED.      -  BONE AND ARTICULAR MARGINS NEGATIVE FOR ACUTE OSTEOMYELITIS.       DATE SIGNED OUT: 04/22/2024 ELECTRONIC SIGNATURE : Legolvan Do, Mark, Pathologist, Electronic Signature  MICROSCOPIC DESCRIPTION  CASE COMMENTS STAINS USED IN DIAGNOSIS: H&E H&E H&E    CLINIC AL HISTORY  SPECIMEN(S) OBTAINED 1. Toe(s), amputation, Left Third Toe, Proximal Margin  Purple Ink  SPECIMEN COMMENTS: SPECIMEN CLINICAL INFORMATION: 1. Type 2 diabetes mellitus with diabetic polyneuropathy, unspecified whether long term insuline use.  Peripheral vascular disease, unspecified, cellulitis of third toe of left foot, skin ulcer of left foot, left, with necrosis of bone, acute osteomyelitis of  toe, left.    Gross Description 1. Specimen: "Left third toe"      Size: 2.8 cm long; up to 2.1 cm diameter      Margin: Grossly viable; white-tan, firm, smooth disarticulated bone without      softening; tan-gray, soft tissue (inked blue).      Lesion:  2.0 x 1.5 cm; lateral surface, 0.3 cm to the closest soft tissue margi;      0.7 cm deep cavity surrounded by tan, indurated skin. The underlying bone is      moderately softened and devoid of discrete hemorrhage.      Other findings: There is a white-tan, firm, unremarkable, attached nail. The      skin is wh ite-tan and smooth.      Block summary:      1A: Bone margin (perpendicular); following decalcification      1B: Lesion (representative) including soft tissue margin (perpendicular)      1C: Lesion including underlying bone; following decalcification      AMG 04/21/2024        Report signed out from the following location(s) [place] [address] [place], [place] [address] CLIA #: [id number]  [facility] [address] [place], [place] [address] CLIA #: [id number]   Glucose, capillary     Status: Abnormal   Collection Time: 04/18/24 10:50 AM  Result Value Ref Range   Glucose-Capillary 112 (H) 70 - 99 mg/dL    Comment: Glucose reference range applies only to samples taken after fasting for at least 8 hours.  Aerobic/Anaerobic Culture w Gram Stain (surgical/deep wound)     Status: None   Collection Time: 04/18/24 12:27 PM   Specimen: Foot, Left; Tissue  Result Value Ref Range   Specimen Description      TISSUE Performed at [facility], [address], [place], [place] [address]    Special Requests      LEFT THIRS TOE BONE CULTURE Performed at [facility], [address] [place], [place] [address]    Gram Stain NO WBC SEEN NO ORGANISMS SEEN     Culture      No growth aerobically or anaerobically. Performed at [facility], [address] [place], [place] [address]    Report Status 04/23/2024 FINAL   Glucose, capillary     Status: Abnormal   Collection Time: 04/18/24 12:57 PM  Result Value Ref Range   Glucose-Capillary 109 (H) 70 - 99 mg/dL    Comment: Glucose reference range applies only to samples taken after fasting for at least 8 hours.    Radiology VAS US  LOWER EXTREMITY ARTERIAL DUPLEX Result Date: 04/15/2024 LOWER EXTREMITY ARTERIAL DUPLEX STUDY Patient Name:  Kenneth Duarte  Date of Exam:   04/14/2024 Medical Rec #: [id number]          Accession #:    [id number] Date of Birth: [birth year]          Patient Gender: M Patient Age:   3 years Exam Location:  [facility] Procedure:      VAS US  LOWER EXTREMITY ARTERIAL DUPLEX Referring Phys: Sharla Davis --------------------------------------------------------------------------------  Indications: Rest pain, and peripheral artery disease. High Risk Factors: Hypertension, hyperlipidemia.  Vascular Interventions: 03/23/2023 Right ATA angioplasty and right SFA stent. Current ABI:            Rt >1.0 [place], Lt >1.0 [place] Comparison Study: 09/25/2023 Performing Technologist: Tonie Franks RVS  Examination Guidelines: A complete evaluation includes B-mode imaging, spectral Doppler, color Doppler, and power Doppler as needed of all accessible portions of each vessel. Bilateral testing is considered an integral part of a complete examination. Limited examinations for reoccurring indications may be performed as noted.  +-----------+--------+-----+--------+--------+--------+ RIGHT      PSV cm/sRatioStenosisWaveformComments  +-----------+--------+-----+--------+--------+--------+ CFA Distal 118                  biphasic         +-----------+--------+-----+--------+--------+--------+ DFA        84                   biphasic         +-----------+--------+-----+--------+--------+--------+ SFA Prox   99                   biphasic         +-----------+--------+-----+--------+--------+--------+ SFA Mid    93                   biphasic         +-----------+--------+-----+--------+--------+--------+ SFA Distal 48                   biphasic         +-----------+--------+-----+--------+--------+--------+ POP Distal 123                  biphasic         +-----------+--------+-----+--------+--------+--------+ ATA Distal 97                   biphasic         +-----------+--------+-----+--------+--------+--------+ PTA Distal 75                   biphasic         +-----------+--------+-----+--------+--------+--------+ PERO Distal30                   biphasic         +-----------+--------+-----+--------+--------+--------+  +-----------+--------+-----+--------+--------+--------+ LEFT       PSV cm/sRatioStenosisWaveformComments +-----------+--------+-----+--------+--------+--------+ SFA Mid    70                                    +-----------+--------+-----+--------+--------+--------+  POP Distal 88                                    +-----------+--------+-----+--------+--------+--------+ ATA Distal 76                                    +-----------+--------+-----+--------+--------+--------+ PTA Distal 21                                    +-----------+--------+-----+--------+--------+--------+ PERO Distal43                                    +-----------+--------+-----+--------+--------+--------+  Summary: Right: Imaging and Waveforms obtained throughout in the Right Lower Extremity. Biphasic Waveforms seen predominantly in the Right Lower Extremity.   See table(s) above for measurements and observations. Electronically signed by Devon Fogo MD on 04/15/2024 at 10:32:07 AM.    Final    VAS US  ABI WITH/WO TBI Result Date: 04/15/2024  LOWER EXTREMITY DOPPLER STUDY Patient Name:  Kenneth Duarte  Date of Exam:   04/14/2024 Medical Rec #: [id number]          Accession #:    [id number] Date of Birth: Nov 01, 1946          Patient Gender: M Patient Age:   74 years Exam Location:  [facility] Procedure:      VAS US  ABI WITH/WO TBI Referring Phys: Devon Fogo --------------------------------------------------------------------------------  Indications: Peripheral artery disease. High Risk Factors: Hypertension, hyperlipidemia, prior MI.  Vascular Interventions: 03/23/2023 Right ATA angioplasty and right SFA stent. Comparison Study: 09/25/2023 Performing Technologist: Tonie Franks RVS  Examination Guidelines: A complete evaluation includes at minimum, Doppler waveform signals and systolic blood pressure reading at the level of bilateral brachial, anterior tibial, and posterior tibial arteries, when vessel segments are accessible. Bilateral testing is considered an integral part of a complete examination. Photoelectric Plethysmograph (PPG) waveforms and toe systolic pressure readings are included as required and additional duplex testing as needed. Limited examinations for reoccurring indications may be performed as noted.  ABI Findings: +---------+------------------+-----+--------+--------+ Right    Rt Pressure (mmHg)IndexWaveformComment  +---------+------------------+-----+--------+--------+ Brachial 124                                     +---------+------------------+-----+--------+--------+ ATA      231               1.76 biphasic         +---------+------------------+-----+--------+--------+ PTA      198               1.51 biphasic         +---------+------------------+-----+--------+--------+ Great Toe125                0.95 Normal           +---------+------------------+-----+--------+--------+ +---------+------------------+-----+--------+-------+ Left     Lt Pressure (mmHg)IndexWaveformComment +---------+------------------+-----+--------+-------+ Brachial 131                                    +---------+------------------+-----+--------+-------+ ATA      137  1.05 biphasic        +---------+------------------+-----+--------+-------+ PTA      144               1.10 biphasic        +---------+------------------+-----+--------+-------+ Great Toe129               0.98 Normal          +---------+------------------+-----+--------+-------+ +-------+-----------+-----------+------------+------------+ ABI/TBIToday's ABIToday's TBIPrevious ABIPrevious TBI +-------+-----------+-----------+------------+------------+ Right  >1.0 [place]    .95        >1.0 [place]     1.11         +-------+-----------+-----------+------------+------------+ Left   >1.0 [place]    .98        >1.0 [place]     1.01         +-------+-----------+-----------+------------+------------+  Bilateral ABIs appear essentially unchanged compared to prior study on 09/25/2023. Compared to prior study on 09/25/2023.  Summary: Right: Resting right ankle-brachial index indicates noncompressible right lower extremity arteries. The right toe-brachial index is normal. Left: Resting left ankle-brachial index indicates noncompressible left lower extremity arteries. The left toe-brachial index is normal. *See table(s) above for measurements and observations.  Electronically signed by Devon Fogo MD on 04/15/2024 at 10:29:22 AM.    Final     Assessment/Plan  Atherosclerosis of native arteries of the extremities with ulceration (HCC) Noninvasive studies performed recently showed noncompressible vessels but good waveforms and digital flow bilaterally.  This is encouraging and he should have adequate flow for wound healing.  We will  keep a fairly close follow-up with his infection and wound.  Follow-up in 2 to 3 months with ABIs.  Benign essential HTN blood pressure control important in reducing the progression of atherosclerotic disease. On appropriate oral medications.   Diabetes mellitus (HCC) blood glucose control important in reducing the progression of atherosclerotic disease. Also, involved in wound healing. On appropriate medications.    Mikki Alexander, MD  04/25/2024 12:01 PM    This note was created with Dragon medical transcription system.  Any errors from dictation are purely unintentional

## 2024-05-05 ENCOUNTER — Other Ambulatory Visit: Payer: Self-pay | Admitting: Specialist

## 2024-05-05 ENCOUNTER — Encounter: Payer: Self-pay | Admitting: Podiatry

## 2024-05-05 DIAGNOSIS — R918 Other nonspecific abnormal finding of lung field: Secondary | ICD-10-CM

## 2024-05-20 NOTE — Progress Notes (Signed)
 Foot and Ankle Surgery Patient Visit  Chief Complaint:   Chief Complaint  Patient presents with  . Left Foot - Follow-up    Left food wound    Subjective  HPI  Kenneth Duarte is a 78 y.o. male who presents for Follow-up of the Left Foot (Left food wound) History of Present Illness Kenneth Duarte is a 78 year old male who presents for follow-up after a partial left third toe amputation and debridement left great toe 4 weeks ago.  He is still changing his dressings as instructed mostly to his left great toe at this time and he is switched back to wearing regular shoes at this point.  He believes that the areas are doing well and are almost completely healed if not totally healed.  Patient Active Problem List  Diagnosis  . Hyperlipemia, mixed  . Diabetes (CMS/HHS-HCC)  . Coronary artery disease  . Atherosclerosis of both carotid arteries  . Benign essential hypertension  . Gastroesophageal reflux disease without esophagitis  . 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 Non-ST elevation myocardial infarction (NSTEMI), subendocardial infarction, subsequent episode of care (CMS/HHS-HCC)  . Bilateral leg edema  . Acute systolic CHF (congestive heart failure) (CMS/HHS-HCC)  . Acute bronchitis with COPD  (CMS/HHS-HCC)  . Anemia, unspecified  . Benign neoplasm of sigmoid colon  . Carotid artery plaque  . Diabetic neuropathy (CMS/HHS-HCC)  . Dyslipidemia  . IDA (iron  deficiency anemia)  . 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 Personal history of colonic polyps  . S/P amputation of lesser toe, left (CMS/HHS-HCC)  . Stable angina ()  . Esophagitis  . Arteriosclerosis of coronary artery  . CHF (congestive heart failure) (CMS/HHS-HCC)  . COPD (chronic obstructive pulmonary disease) (CMS/HHS-HCC)    Allergies  Allergen Reactions  . Blood-Group Specific Substance Other (See Comments)    Patient received almost 2 units  FFP during a procedure. Became hypotensive and developed rash  . Cortisone Unknown, Other (See Comments) and Rash    The patient feels that he can take this, he not sure why this was put on his file    Social Drivers of Health   Tobacco Use: Medium Risk (05/21/2024)   Patient History   . Smoking Tobacco Use: Former   . Smokeless Tobacco Use: Never   . Passive Exposure: Not on file  Alcohol Use: Not on file  Financial Resource Strain: Low Risk  (01/10/2024)   Overall Financial Resource Strain (CARDIA)   . Difficulty of Paying Living Expenses: Not hard at all  Food Insecurity: No Food Insecurity (01/10/2024)   Hunger Vital Sign   . Worried About Programme researcher, broadcasting/film/video in the Last Year: Never true   . Ran Out of Food in the Last Year: Never true  Transportation Needs: No Transportation Needs (01/10/2024)   PRAPARE - Transportation   . Lack of Transportation (Medical): No   . Lack of Transportation (Non-Medical): No  Physical Activity: Not on file  Stress: Not on file  Social Connections: Not on file  Depression: Not on file  Housing Stability: Low Risk  (05/21/2024)   Housing Stability Vital Sign   . Unable to Pay for Housing in the Last Year: No   . Number of Times Moved in the Last Year: 0   . Homeless in the Last Year: No  Utilities: Not At Risk (01/10/2024)   [facility] Utilities   .  Threatened with loss of utilities: No  Health Literacy: Not on file    Outpatient Medications Prior to Visit  Medication Sig Dispense Refill  . amLODIPine -benazepril (LOTREL) 5-20 mg capsule     . aspirin  81 MG EC tablet Take 325 mg by mouth once daily.      . atorvastatin  (LIPITOR ) 80 MG tablet Take 1 tablet by mouth at bedtime    . BD ALCOHOL SWABS PadM     . carvediloL  (COREG ) 12.5 MG tablet Take 1 tablet by mouth 2 (two) times daily with meals    . clopidogreL  (PLAVIX ) 75 mg tablet Take 1 tablet by mouth once daily    . cyanocobalamin  (VITAMIN B12) 1000 MCG tablet     . dapagliflozin  propanediol  (FARXIGA ) 10 mg tablet Take 10 mg by mouth once daily    . docusate (COLACE) 100 MG capsule Take 100 mg by mouth once daily as needed for Constipation    . ENTRESTO  24-26 mg tablet Take 1 tablet by mouth 2 (two) times daily    . ergocalciferol , vitamin D2, 50,000 unit capsule Take 50,000 Units by mouth once a week    . ferrous sulfate  325 (65 FE) MG tablet Take 325 mg by mouth daily with breakfast.    . fluticasone propionate (FLONASE) 50 mcg/actuation nasal spray Place 2 sprays into both nostrils once daily    . FUROsemide  (LASIX ) 20 MG tablet Take 2 tablets (40 mg total) by mouth once daily 90 tablet 4  . gabapentin  (NEURONTIN ) 800 MG tablet Take 1,600 mg by mouth 3 (three) times daily.      . honey 80 % Gel Apply 1 mL topically once daily 15 mL 2  . lisinopriL  (ZESTRIL ) 20 MG tablet     . meclizine  (ANTIVERT ) 25 mg tablet Take 25 mg by mouth once daily    . metFORMIN  (GLUCOPHAGE ) 500 MG tablet Take 500 mg by mouth daily with breakfast.      . naloxone (NARCAN) 4 mg/actuation nasal spray 1 spray once.    . naproxen (NAPROSYN) 500 MG tablet     . nitroGLYcerin  (NITROSTAT ) 0.4 MG SL tablet Place 0.4 mg under the tongue every 5 (five) minutes as needed    . oxyCODONE -acetaminophen  (PERCOCET) 10-325 mg tablet Take 1 tablet by mouth every 4 (four) hours as needed for Pain    . pantoprazole  (PROTONIX ) 40 MG DR tablet Take 1 tablet by mouth once daily    . SF 1.1 % dental gel APPLY A PEA-SIZED AMOUNT OF PASTE TO A TOOTHBRUSH AND BRUSH FOR TWO MINUTES. DO NOT EAT, DRINK OR RINSE FOR 30 MINUTES AFTER USE    . amLODIPine  (NORVASC ) 5 MG tablet  (Patient not taking: Reported on 05/21/2024)    . amLODIPine -benazepril (LOTREL) 2.5-10 mg capsule Take 1 capsule by mouth once daily (Patient not taking: Reported on 05/21/2024)    . atorvastatin  (LIPITOR ) 80 MG tablet Take 80 mg by mouth once daily. (Patient not taking: Reported on 05/21/2024)    . azithromycin (ZITHROMAX) 250 MG tablet  (Patient not taking:  Reported on 05/21/2024)    . clopidogrel  (PLAVIX ) 75 mg tablet Take 1 tablet (75 mg total) by mouth once daily. (Patient not taking: Reported on 05/21/2024) 90 tablet 3  . gabapentin  (NEURONTIN ) 800 MG tablet Take 800 mg by mouth once daily (Patient not taking: Reported on 05/21/2024)    . hydrALAZINE  (APRESOLINE ) 25 MG tablet Take 25 mg by mouth    . hydrocodone-chlorpheniramine (TUSSIONEX) 10-8 mg/5 mL  ER suspension  (Patient not taking: Reported on 05/21/2024)    . metFORMIN  (GLUCOPHAGE -XR) 500 MG XR tablet Take 500 mg by mouth daily with dinner (Patient not taking: Reported on 05/21/2024)    . pantoprazole  (PROTONIX ) 40 MG DR tablet Take 1 tablet (40 mg total) by mouth once daily. (Patient not taking: Reported on 05/21/2024) 90 tablet 3  . pioglitazone  (ACTOS ) 15 MG tablet Take 15 mg by mouth once daily.   (Patient not taking: Reported on 05/21/2024)    . polyethylene glycol (MIRALAX ) powder Take 17 g by mouth once daily Mix in 4-8ounces of fluid prior to taking. (Patient not taking: Reported on 05/21/2024)     No facility-administered medications prior to visit.      Objective  Vitals:   05/21/24 1046  BP: (!) 146/70  Pulse: 60  Weight: 86.2 kg (190 lb)  Height: 182.9 cm (6')  PainSc: 0-No pain     Body mass index is 25.77 kg/m.  Home Vitals:     Physical Exam Physical Exam   General/Constitutional: No apparent distress: well-nourished and well developed.   Psych: Normal mood and affect, oriented to person, place and time (AOx3)   Vascular: DP/PT pulses +1 bilateral, capillary fill time intact to digits bilaterally, no hair growth present to digits of bilateral lower extremities.   Neuro: Light touch sensation intact bilateral lower extremities.   Derm: Left third toe amputation site appears to be well coapted, no dehiscence or signs of infection overall, mild edema, minimal to no erythema.  No openings, appears to be completely healed.  Left great toe medially over the proximal  phalanx PIPJ level appears to have a small dermal ulceration present which appears to be mostly callused over at this time, once callus was removed it did reveal dermal ulceration measuring approximately 0.3 x 0.1 cm, no erythema, no edema, no signs of infection, appears to be greatly improved and almost completely epithelialized today.  Nails x 7 are elongated, thickened, and dystrophic and brittle with subungual debris.   MSK: 5/5 muscle strength bilateral lower extremity muscle groups. L 3rd toe partial amp, L 2nd toe amp, R partial 5th ray amp.  Hammertoe contractures digits 1, 4, 5 left foot.  X-ray: none taken  Results Debridment of ulcer: Location: Left hallux IPJ medial Pre-debridement measurement: 0 Post-debridement measurement: 0.3 x 0.1 cm Tissue removed:   Hyperkeratotic tissue, biofilm Ulcer was debrided sharply with combination of tissue nippers and scalpel blade into the Dermis      Assessment/Plan:   Assessment & Plan Status post left third toe partial amputation - No openings present today, remains healed.  Left great toe ulceration, dermal layer exposed Ulceration improved, wound size 0.3 x 0.1 cm, minimal depth, almost completely healed. - Wound debridement performed as described above. - Continue antibiotic ointment and dry sterile dressing changes every other day.  Applied today. - May transition back to diabetic shoes with inserts/good supportive shoes.  No barefoot walking.  Mycotic nails -Nails x7 debrided reducing the thickness of the toenail and excessive curvature of the significantly thickened/dystrophic/diseased nail with sterile nail nipper.  Patient tolerated procedure well.    Diagnoses and all orders for this visit:  Postop check  Toe ulcer, left, limited to breakdown of skin (CMS/HHS-HCC)  S/P amputation of lesser toe, left (CMS/HHS-HCC)  Status post amputation of toe of right foot (CMS/HHS-HCC)  Type 2 diabetes mellitus with  polyneuropathy (CMS/HHS-HCC)  PVD (peripheral vascular disease) ()  Mycotic toenails  Return in about 6 weeks (around 07/02/2024) for Recheck L hallux ulcer.  An after visit summary was provided for the patient either in written format (printed) or through MyChart if needed/necessary .  This note has been created using automated tools and reviewed for accuracy by ANDREW MICHAEL BAKER.

## 2024-05-22 ENCOUNTER — Encounter: Payer: Self-pay | Admitting: Oncology

## 2024-05-22 ENCOUNTER — Ambulatory Visit
Admission: RE | Admit: 2024-05-22 | Discharge: 2024-05-22 | Disposition: A | Discharge status: Home / Self Care | Source: Ambulatory Visit | Attending: Specialist | Admitting: Specialist

## 2024-05-22 DIAGNOSIS — R918 Other nonspecific abnormal finding of lung field: Secondary | ICD-10-CM | POA: Diagnosis present

## 2024-06-11 ENCOUNTER — Other Ambulatory Visit: Payer: Self-pay | Admitting: Cardiovascular Disease

## 2024-07-02 ENCOUNTER — Encounter: Payer: Self-pay | Admitting: Oncology

## 2024-07-02 ENCOUNTER — Inpatient Hospital Stay

## 2024-07-02 ENCOUNTER — Encounter: Payer: Self-pay | Admitting: Emergency Medicine

## 2024-07-02 ENCOUNTER — Inpatient Hospital Stay
Admission: EM | Admit: 2024-07-02 | Discharge: 2024-07-03 | DRG: 041 | Disposition: A | Discharge status: Home / Self Care | Source: Ambulatory Visit | Attending: Internal Medicine | Admitting: Internal Medicine

## 2024-07-02 ENCOUNTER — Other Ambulatory Visit: Payer: Self-pay

## 2024-07-02 DIAGNOSIS — E1169 Type 2 diabetes mellitus with other specified complication: Secondary | ICD-10-CM | POA: Diagnosis present

## 2024-07-02 DIAGNOSIS — Z7902 Long term (current) use of antithrombotics/antiplatelets: Secondary | ICD-10-CM | POA: Diagnosis not present

## 2024-07-02 DIAGNOSIS — L089 Local infection of the skin and subcutaneous tissue, unspecified: Secondary | ICD-10-CM | POA: Diagnosis not present

## 2024-07-02 DIAGNOSIS — G894 Chronic pain syndrome: Secondary | ICD-10-CM | POA: Diagnosis not present

## 2024-07-02 DIAGNOSIS — Z8042 Family history of malignant neoplasm of prostate: Secondary | ICD-10-CM

## 2024-07-02 DIAGNOSIS — M869 Osteomyelitis, unspecified: Principal | ICD-10-CM | POA: Diagnosis present

## 2024-07-02 DIAGNOSIS — I2510 Atherosclerotic heart disease of native coronary artery without angina pectoris: Secondary | ICD-10-CM | POA: Diagnosis not present

## 2024-07-02 DIAGNOSIS — Z889 Allergy status to unspecified drugs, medicaments and biological substances status: Secondary | ICD-10-CM

## 2024-07-02 DIAGNOSIS — Z955 Presence of coronary angioplasty implant and graft: Secondary | ICD-10-CM

## 2024-07-02 DIAGNOSIS — Z951 Presence of aortocoronary bypass graft: Secondary | ICD-10-CM

## 2024-07-02 DIAGNOSIS — L97526 Non-pressure chronic ulcer of other part of left foot with bone involvement without evidence of necrosis: Secondary | ICD-10-CM | POA: Diagnosis present

## 2024-07-02 DIAGNOSIS — Z8249 Family history of ischemic heart disease and other diseases of the circulatory system: Secondary | ICD-10-CM | POA: Diagnosis not present

## 2024-07-02 DIAGNOSIS — Z79899 Other long term (current) drug therapy: Secondary | ICD-10-CM

## 2024-07-02 DIAGNOSIS — Z87891 Personal history of nicotine dependence: Secondary | ICD-10-CM

## 2024-07-02 DIAGNOSIS — Z7982 Long term (current) use of aspirin: Secondary | ICD-10-CM | POA: Diagnosis not present

## 2024-07-02 DIAGNOSIS — I252 Old myocardial infarction: Secondary | ICD-10-CM

## 2024-07-02 DIAGNOSIS — I5022 Chronic systolic (congestive) heart failure: Secondary | ICD-10-CM | POA: Diagnosis not present

## 2024-07-02 DIAGNOSIS — E11621 Type 2 diabetes mellitus with foot ulcer: Secondary | ICD-10-CM | POA: Diagnosis present

## 2024-07-02 DIAGNOSIS — Z809 Family history of malignant neoplasm, unspecified: Secondary | ICD-10-CM

## 2024-07-02 DIAGNOSIS — Z833 Family history of diabetes mellitus: Secondary | ICD-10-CM

## 2024-07-02 DIAGNOSIS — Z860100 Personal history of colon polyps, unspecified: Secondary | ICD-10-CM | POA: Diagnosis not present

## 2024-07-02 DIAGNOSIS — I10 Essential (primary) hypertension: Secondary | ICD-10-CM | POA: Diagnosis present

## 2024-07-02 DIAGNOSIS — I110 Hypertensive heart disease with heart failure: Secondary | ICD-10-CM | POA: Diagnosis not present

## 2024-07-02 DIAGNOSIS — D649 Anemia, unspecified: Secondary | ICD-10-CM | POA: Diagnosis present

## 2024-07-02 DIAGNOSIS — E785 Hyperlipidemia, unspecified: Secondary | ICD-10-CM | POA: Diagnosis not present

## 2024-07-02 DIAGNOSIS — E119 Type 2 diabetes mellitus without complications: Secondary | ICD-10-CM

## 2024-07-02 DIAGNOSIS — E11628 Type 2 diabetes mellitus with other skin complications: Secondary | ICD-10-CM | POA: Diagnosis not present

## 2024-07-02 DIAGNOSIS — E1142 Type 2 diabetes mellitus with diabetic polyneuropathy: Principal | ICD-10-CM | POA: Diagnosis present

## 2024-07-02 DIAGNOSIS — L03032 Cellulitis of left toe: Secondary | ICD-10-CM | POA: Diagnosis present

## 2024-07-02 DIAGNOSIS — K219 Gastro-esophageal reflux disease without esophagitis: Secondary | ICD-10-CM | POA: Diagnosis present

## 2024-07-02 LAB — CBC WITH DIFFERENTIAL/PLATELET
Abs Immature Granulocytes: 0.02 K/uL (ref 0.00–0.07)
Basophils Absolute: 0 K/uL (ref 0.0–0.1)
Basophils Relative: 1 %
Eosinophils Absolute: 0 K/uL (ref 0.0–0.5)
Eosinophils Relative: 0 %
HCT: 34.7 % — ABNORMAL LOW (ref 39.0–52.0)
Hemoglobin: 11.2 g/dL — ABNORMAL LOW (ref 13.0–17.0)
Immature Granulocytes: 0 %
Lymphocytes Relative: 16 %
Lymphs Abs: 1.3 K/uL (ref 0.7–4.0)
MCH: 28.7 pg (ref 26.0–34.0)
MCHC: 32.3 g/dL (ref 30.0–36.0)
MCV: 89 fL (ref 80.0–100.0)
Monocytes Absolute: 0.7 K/uL (ref 0.1–1.0)
Monocytes Relative: 8 %
Neutro Abs: 6.1 K/uL (ref 1.7–7.7)
Neutrophils Relative %: 75 %
Platelets: 228 K/uL (ref 150–400)
RBC: 3.9 MIL/uL — ABNORMAL LOW (ref 4.22–5.81)
RDW: 14.8 % (ref 11.5–15.5)
WBC: 8.2 K/uL (ref 4.0–10.5)
nRBC: 0 % (ref 0.0–0.2)

## 2024-07-02 LAB — GLUCOSE, CAPILLARY
Glucose-Capillary: 107 mg/dL — ABNORMAL HIGH (ref 70–99)
Glucose-Capillary: 197 mg/dL — ABNORMAL HIGH (ref 70–99)
Glucose-Capillary: 139 mg/dL — ABNORMAL HIGH (ref 70–99)

## 2024-07-02 LAB — BASIC METABOLIC PANEL WITH GFR
Anion gap: 9 (ref 5–15)
BUN: 16 mg/dL (ref 8–23)
CO2: 25 mmol/L (ref 22–32)
Calcium: 8.5 mg/dL — ABNORMAL LOW (ref 8.9–10.3)
Chloride: 105 mmol/L (ref 98–111)
Creatinine, Ser: 1.46 mg/dL — ABNORMAL HIGH (ref 0.61–1.24)
GFR, Estimated: 50 mL/min — ABNORMAL LOW (ref >60)
Glucose, Bld: 158 mg/dL — ABNORMAL HIGH (ref 70–99)
Potassium: 4.1 mmol/L (ref 3.5–5.1)
Sodium: 139 mmol/L (ref 135–145)

## 2024-07-02 MED ORDER — ACETAMINOPHEN 650 MG RE SUPP: 650.0000 mg | Freq: Four times a day (QID) | RECTAL | Status: DC | PRN

## 2024-07-02 MED ORDER — ONDANSETRON HCL 4 MG PO TABS: 4.0000 mg | ORAL_TABLET | Freq: Four times a day (QID) | ORAL | Status: DC | PRN

## 2024-07-02 MED ORDER — HYDRALAZINE HCL 20 MG/ML IJ SOLN: 10.0000 mg | Freq: Four times a day (QID) | INTRAMUSCULAR | Status: DC | PRN

## 2024-07-02 MED ORDER — HYDROMORPHONE HCL 1 MG/ML IJ SOLN
0.5000 mg | INTRAMUSCULAR | Status: DC | PRN
  Administered 2024-07-02: 1 mg via INTRAVENOUS
  Filled 2024-07-02: qty 1

## 2024-07-02 MED ORDER — VANCOMYCIN HCL 1250 MG/250ML IV SOLN
1250.0000 mg | INTRAVENOUS | Status: DC
  Filled 2024-07-02: qty 250

## 2024-07-02 MED ORDER — POLYETHYLENE GLYCOL 3350 17 G PO PACK: 17.0000 g | PACK | Freq: Every day | ORAL | Status: DC | PRN

## 2024-07-02 MED ORDER — SODIUM CHLORIDE 0.9 % IV SOLN: INTRAVENOUS | Status: AC

## 2024-07-02 MED ORDER — PIPERACILLIN-TAZOBACTAM 3.375 G IVPB 30 MIN
3.3750 g | Freq: Once | INTRAVENOUS | Status: AC
  Administered 2024-07-02: 3.375 g via INTRAVENOUS
  Filled 2024-07-02: qty 50

## 2024-07-02 MED ORDER — ACETAMINOPHEN 325 MG PO TABS
325.0000 mg | ORAL_TABLET | ORAL | Status: DC | PRN
  Administered 2024-07-02: 325 mg via ORAL
  Filled 2024-07-02: qty 1

## 2024-07-02 MED ORDER — ONDANSETRON HCL 4 MG/2ML IJ SOLN: 4.0000 mg | Freq: Four times a day (QID) | INTRAMUSCULAR | Status: DC | PRN

## 2024-07-02 MED ORDER — INSULIN ASPART 100 UNIT/ML IJ SOLN
0.0000 [IU] | Freq: Three times a day (TID) | INTRAMUSCULAR | Status: DC
  Administered 2024-07-03: 1 [IU] via SUBCUTANEOUS
  Filled 2024-07-02: qty 1

## 2024-07-02 MED ORDER — SACUBITRIL-VALSARTAN 24-26 MG PO TABS
1.0000 | ORAL_TABLET | Freq: Two times a day (BID) | ORAL | Status: DC
  Administered 2024-07-02 – 2024-07-03 (×2): 1 via ORAL
  Filled 2024-07-02 (×3): qty 1

## 2024-07-02 MED ORDER — OXYCODONE HCL 5 MG PO TABS
10.0000 mg | ORAL_TABLET | ORAL | Status: DC | PRN
  Administered 2024-07-02 – 2024-07-03 (×4): 10 mg via ORAL
  Filled 2024-07-02 (×4): qty 2

## 2024-07-02 MED ORDER — ACETAMINOPHEN 325 MG PO TABS
650.0000 mg | ORAL_TABLET | Freq: Four times a day (QID) | ORAL | Status: DC | PRN
  Administered 2024-07-03: 650 mg via ORAL
  Filled 2024-07-02: qty 2

## 2024-07-02 MED ORDER — INSULIN ASPART 100 UNIT/ML IJ SOLN: 0.0000 [IU] | Freq: Every day | INTRAMUSCULAR | Status: DC

## 2024-07-02 MED ORDER — OXYCODONE-ACETAMINOPHEN 10-325 MG PO TABS: 1.0000 | ORAL_TABLET | ORAL | Status: DC

## 2024-07-02 MED ORDER — VANCOMYCIN HCL 1500 MG/300ML IV SOLN
1500.0000 mg | INTRAVENOUS | Status: AC
  Administered 2024-07-02: 1500 mg via INTRAVENOUS
  Filled 2024-07-02: qty 300

## 2024-07-02 MED ORDER — CARVEDILOL 3.125 MG PO TABS
3.1250 mg | ORAL_TABLET | Freq: Two times a day (BID) | ORAL | Status: DC
  Administered 2024-07-02 – 2024-07-03 (×2): 3.125 mg via ORAL
  Filled 2024-07-02 (×2): qty 1

## 2024-07-02 MED ORDER — PIPERACILLIN-TAZOBACTAM 3.375 G IVPB
3.3750 g | Freq: Three times a day (TID) | INTRAVENOUS | Status: DC
  Administered 2024-07-02 – 2024-07-03 (×2): 3.375 g via INTRAVENOUS
  Filled 2024-07-02 (×2): qty 50

## 2024-07-02 MED ORDER — ATORVASTATIN CALCIUM 80 MG PO TABS
80.0000 mg | ORAL_TABLET | Freq: Every day | ORAL | Status: DC
  Administered 2024-07-02: 80 mg via ORAL
  Filled 2024-07-02: qty 1

## 2024-07-02 MED ORDER — ENOXAPARIN SODIUM 40 MG/0.4ML IJ SOSY
40.0000 mg | PREFILLED_SYRINGE | INTRAMUSCULAR | Status: DC
  Administered 2024-07-02: 40 mg via SUBCUTANEOUS
  Filled 2024-07-02: qty 0.4

## 2024-07-02 NOTE — Consult Note (Signed)
 Pharmacy Antibiotic Note  ASSESSMENT: 78 y.o. male with PMH including T2DM, left toe osteomyelitis, CKD, HTN, HFrEF, CAD (CABGx3, NSTEMI)  is presenting with wound infection. Foot XR in process. Pharmacy has been consulted to manage Zosyn  and vancomycin  dosing. Patient's serum creatinine appears to be at upper end of baseline.  PLAN: Initiate Zosyn  3.375g IV q8H Administer vancomycin  1500mg  IV x 1 as a loading dose, followed by 1250mg  IV q24H eAUC 481, Cmax 32, Cmin 12 Scr 1.46, IBW, Vd 0.72 L/kg Follow up culture results to assess for antibiotic optimization. Monitor renal function to assess for any necessary antibiotic dosing changes.  Patient measurements: Height: 5' 11 (180.3 cm) Weight: 83.9 kg (185 lb) IBW/kg (Calculated) : 75.3  Vital signs: Temp: 97.9 F (36.6 C) (07/16 1044) Temp Source: Oral (07/16 1044) BP: 104/56 (07/16 1045) Pulse Rate: 64 (07/16 1044) Recent Labs  Lab 07/02/24 1048  WBC 8.2  CREATININE 1.46*   Estimated Creatinine Clearance: 46.6 mL/min (A) (by C-G formula based on SCr of 1.46 mg/dL (H)).  Allergies: Allergies  Allergen Reactions   Blood-Group Specific Substance Other (See Comments)    Patient received almost 2 units FFP during a procedure. Became hypotensive and developed rash   Cortisone Acetate [Cortisone] Rash and Other (See Comments)    The patient feels that he can take this, he not sure why this was put on his file    Antimicrobials this admission: Zosyn  7/16 >> Vancomycin  7/16 >>  Dose adjustments this admission: N/A  Microbiology results: N/A   Thank you for allowing pharmacy to be a part of this patient's care.  Will M. Lenon, PharmD Clinical Pharmacist 07/02/2024 12:59 PM

## 2024-07-02 NOTE — Consult Note (Signed)
 PODIATRY / FOOT AND ANKLE SURGERY CONSULTATION NOTE  Requesting Physician: Dr. Roann  Reason for consult: L 4th toe infection   HPI: Kenneth Duarte is a 78 y.o. male who presents with an open ulceration to the left 4th toe with bone exposed.  He noted the sore over the weekend and was seen today in clinic.  He was noted to have a foul smelling odorous wound with necrosis and bone exposed.  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  He was advised to go to the ED for eval, IV Abx, and for amputation likely tomorrow afternoon.  He has a hx of DM2 with polyneuropathy, chronic pain, and PVD.    PMHx:  Past Medical History:  Diagnosis Date   Arthritis    Atherosclerosis of both carotid arteries    Benign neoplasm of sigmoid colon    Bilateral leg edema    Cardiomyopathy (HCC)    Cellulitis in diabetic foot (HCC)    Chronic pain syndrome    Congestive heart failure (HCC) 1980   COPD (chronic obstructive pulmonary disease) (HCC)    Coronary artery disease    a. s/p 3V CABG (03/2017). b. NSTEMI 06/2017 with progressive graft disease, s/p DES to SVG-OM.   DM (diabetes mellitus), type 2 (HCC)    Dyspnea    with exertion   ED (erectile dysfunction)    GERD (gastroesophageal reflux disease)    Gout    Headache    Hyperlipemia    Hypertension    CONTROLLED ON MEDS   IDA (iron  deficiency anemia) 10/21/2019   MRSA nasal colonization 2018   MSSA (methicillin susceptible Staphylococcus aureus) 04/17/2024   Neuropathy of both feet    NSTEMI (non-ST elevated myocardial infarction) (HCC)    Osteomyelitis of foot (HCC)    S/P amputation of lesser toe, left (HCC)    S/P CABG x 3    Seborrheic keratosis    Sinus congestion    Stable angina (HCC)    Vertigo    Wears dentures    PARTIAL UPPER    Surgical Hx:  Past Surgical History:  Procedure Laterality Date   AMPUTATION TOE Left 04/18/2024   Procedure: AMPUTATION, TOE, THIRD;  Surgeon: [name], DPM;  Location: [facility] ORS;  Service: Orthopedics/Podiatry;  Laterality: Left;   COLONOSCOPY WITH PROPOFOL  N/A 10/25/2015   Procedure: COLONOSCOPY WITH PROPOFOL ;  Surgeon: Rogelia Copping, MD;  Location: [facility];  Service: Endoscopy;  Laterality: N/A;  DIABETIC-ORAL MEDS   COLONOSCOPY WITH PROPOFOL  N/A 10/22/2020   Procedure: COLONOSCOPY CANCELED;  Surgeon: Copping Rogelia, MD;  Location: [facility];  Service: Endoscopy;  Laterality: N/A;  procedure aborted poor prep   COLONOSCOPY WITH PROPOFOL  N/A 10/25/2020   Procedure: COLONOSCOPY WITH PROPOFOL ;  Surgeon: Copping Rogelia, MD;  Location: [facility];  Service: Endoscopy;  Laterality: N/A;   CORONARY ARTERY BYPASS GRAFT N/A 04/10/2017   Procedure: CORONARY ARTERY BYPASS GRAFTING (CABG) x three , using left internal mammary artery and right leg greater saphenous vein harvested endoscopically;  Surgeon: Maude Fleeta Ochoa, MD;  Location: [facility] OR;  Service: Open Heart Surgery;  Laterality: N/A;   CORONARY ARTERY BYPASS GRAFT     CORONARY STENT INTERVENTION N/A 06/19/2017   Procedure: Coronary Stent Intervention;  Surgeon: Dann Candyce RAMAN, MD;  Location: [facility] INVASIVE CV LAB;  Service: Cardiovascular;  Laterality: N/A;   ESOPHAGOGASTRODUODENOSCOPY (EGD) WITH PROPOFOL  N/A 10/22/2020   Procedure: ESOPHAGOGASTRODUODENOSCOPY (EGD) WITH PROPOFOL ;  Surgeon:  Jinny Carmine, MD;  Location: [facility];  Service: Endoscopy;  Laterality: N/A;  Diabetic - oral meds   EXCISION PARTIAL PHALANX Right 03/22/2023   Procedure: EXCISION  PHALANX;  Surgeon: [name], DPM;  Location: [facility] OR[facility];  Service: Podiatry;  Laterality: Right;   INCISION AND DRAINAGE Right 04/19/2023   Procedure: INCISION AND DRAINAGE;  Surgeon: Neill Boas, DPM;  Location: [facility];  Service: Podiatry;  Laterality: Right;   IRRIGATION AND DEBRIDEMENT FOOT Right 03/22/2023   Procedure: IRRIGATION AND DEBRIDEMENT FOOT;  Surgeon: [name], DPM;  Location: [facility] OR[facility];   Service: Podiatry;  Laterality: Right;   KNEE SURGERY Right    over 20 years ago   LEFT HEART CATH AND CORONARY ANGIOGRAPHY Left 02/22/2017   Procedure: Left Heart Cath and Coronary Angiography;  Surgeon: Wolm JINNY Rhyme, MD;  Location: [facility] INVASIVE CV LAB;  Service: Cardiovascular;  Laterality: Left;   LEFT HEART CATH AND CORS/GRAFTS ANGIOGRAPHY N/A 06/18/2017   Procedure: Left Heart Cath and Cors/Grafts Angiography and PCI;  Surgeon: Ammon Blunt, MD;  Location: [facility] INVASIVE CV LAB;  Service: Cardiovascular;  Laterality: N/A;   LOWER EXTREMITY ANGIOGRAPHY Right 03/23/2023   Procedure: Lower Extremity Angiography;  Surgeon: Marea Selinda RAMAN, MD;  Location: [facility] INVAS[facility] CV LAB;  Service: Cardiovascular;  Laterality: Right;   LOWER EXTREMITY INTERVENTION  03/23/2023   Procedure: LOWER EXTREMITY INTERVENTION;  Surgeon: Marea Selinda RAMAN, MD;  Location: [facility] INVAS[facility] CV LAB;  Service: Cardiovascular;;   POLYPECTOMY  10/25/2015   Procedure: POLYPECTOMY;  Surgeon: Carmine Jinny, MD;  Location: [facility];  Service: Endoscopy;;   RIGHT/LEFT HEART CATH AND CORONARY/GRAFT ANGIOGRAPHY Bilateral 08/31/2023   Procedure: RIGHT/LEFT HEART CATH AND CORONARY/GRAFT ANGIOGRAPHY;  Surgeon: Darron Deatrice LABOR, MD;  Location: [facility] INVASIVE CV LAB;  Service: Cardiovascular;  Laterality: Bilateral;   ROTATOR CUFF REPAIR Right 2012   TEE WITHOUT CARDIOVERSION N/A 04/10/2017   Procedure: TRANSESOPHAGEAL ECHOCARDIOGRAM (TEE);  Surgeon: Maude Fleeta Ochoa, MD;  Location: [facility] OR;  Service: Open Heart Surgery;  Laterality: N/A;    FHx:  Family History  Problem Relation Age of Onset   Cancer Mother    Cancer Father    Heart disease Father    Heart attack Father    Diabetes Sister    Heart disease Brother    Diabetes Brother    Prostate cancer Brother    Bladder Cancer Neg Hx    Kidney disease Neg Hx     Social History:  reports that he quit smoking about 45 years ago. His smoking use included cigarettes. He started  smoking about 60 years ago. He has a 30 pack-year smoking history. He has never used smokeless tobacco. He reports that he does not drink alcohol and does not use drugs.  Allergies:  Allergies  Allergen Reactions   Blood-Group Specific Substance Other (See Comments)    Patient received almost 2 units FFP during a procedure. Became hypotensive and developed rash   Cortisone Acetate [Cortisone] Rash and Other (See Comments)    The patient feels that he can take this, he not sure why this was put on his file    Medications Prior to Admission  Medication Sig Dispense Refill   aspirin  81 MG tablet Take 1 tablet (81 mg total) by mouth daily. 90 tablet 1   atorvastatin  (LIPITOR ) 80 MG tablet TAKE 1 TABLET AT BEDTIME 90 tablet 3   carvedilol  (COREG ) 3.125 MG tablet Take 3.125 mg by mouth 2 (two) times daily.  clopidogrel  (PLAVIX ) 75 MG tablet TAKE 1 TABLET EVERY DAY 90 tablet 3   dapagliflozin  propanediol (FARXIGA ) 10 MG TABS tablet Take 1 tablet (10 mg total) by mouth daily. 90 tablet 3   DENTA 5000 PLUS 1.1 % CREA dental cream Place 1 Application onto teeth at bedtime.     ENTRESTO  24-26 MG TAKE 1 TABLET TWICE DAILY 180 tablet 3   gabapentin  (NEURONTIN ) 800 MG tablet Take 1,600 mg by mouth 3 (three) times daily.     hydrALAZINE  (APRESOLINE ) 25 MG tablet Take 1 tablet (25 mg total) by mouth 3 (three) times daily as needed. For blood pressure greater then 160. (Patient taking differently: Take 25 mg by mouth 3 (three) times daily as needed (For blood pressure greater then 160.).) 270 tablet 3   meclizine  (ANTIVERT ) 25 MG tablet TAKE 1 TABLET TWICE DAILY AS NEEDED FOR DIZZINESS 180 tablet 3   metFORMIN  (GLUCOPHAGE -XR) 500 MG 24 hr tablet Take 500 mg by mouth daily.     nitroGLYCERIN  (NITROSTAT ) 0.4 MG SL tablet Place 1 tablet (0.4 mg total) under the tongue every 5 (five) minutes as needed for chest pain. 25 tablet 3   oxyCODONE -acetaminophen  (PERCOCET) 10-325 MG tablet Take 1 tablet by mouth See  admin instructions. Every 3-4 hours     pantoprazole  (PROTONIX ) 40 MG tablet TAKE 1 TABLET EVERY DAY 90 tablet 3   vitamin B-12 (CYANOCOBALAMIN ) 1000 MCG tablet Take 1,000 mcg by mouth daily.     VITAMIN D -1000 MAX ST 25 MCG (1000 UT) tablet Take 1,000 Units by mouth daily.      Physical Exam: General: Alert and oriented.  No apparent distress.  Vascular: DP/PT pulses faintly palpable bil, CFT intact to digits bil, no hair growth present to BLE  Neuro:Light touch sensation absent to BLE  Derm:L 4th toe distal tip ulceration present, probes to bone, foul odor, serousanginous drainage, associated erythema and edema, erythema extending to from L 4th toe to ankle joint.  MSK: Partial L 2-3 toe amputations   Results for orders placed or performed during the hospital encounter of 07/02/24 (from the past 48 hours)  CBC with Differential     Status: Abnormal   Collection Time: 07/02/24 10:48 AM  Result Value Ref Range   WBC 8.2 4.0 - 10.5 K/uL   RBC 3.90 (L) 4.22 - 5.81 MIL/uL   Hemoglobin 11.2 (L) 13.0 - 17.0 g/dL   HCT 65.2 (L) 60.9 - 47.9 %   MCV 89.0 80.0 - 100.0 fL   MCH 28.7 26.0 - 34.0 pg   MCHC 32.3 30.0 - 36.0 g/dL   RDW 85.1 88.4 - 84.4 %   Platelets 228 150 - 400 K/uL   nRBC 0.0 0.0 - 0.2 %   Neutrophils Relative % 75 %   Neutro Abs 6.1 1.7 - 7.7 K/uL   Lymphocytes Relative 16 %   Lymphs Abs 1.3 0.7 - 4.0 K/uL   Monocytes Relative 8 %   Monocytes Absolute 0.7 0.1 - 1.0 K/uL   Eosinophils Relative 0 %   Eosinophils Absolute 0.0 0.0 - 0.5 K/uL   Basophils Relative 1 %   Basophils Absolute 0.0 0.0 - 0.1 K/uL   Immature Granulocytes 0 %   Abs Immature Granulocytes 0.02 0.00 - 0.07 K/uL    Comment: Performed at [facility], [address] [place], [place] [address]  Basic metabolic panel     Status: Abnormal   Collection Time: 07/02/24 10:48 AM  Result Value Ref Range  Sodium 139 135 - 145 mmol/L   Potassium 4.1 3.5 - 5.1 mmol/L   Chloride 105 98 - 111  mmol/L   CO2 25 22 - 32 mmol/L   Glucose, Bld 158 (H) 70 - 99 mg/dL    Comment: Glucose reference range applies only to samples taken after fasting for at least 8 hours.   BUN 16 8 - 23 mg/dL   Creatinine, Ser 8.53 (H) 0.61 - 1.24 mg/dL   Calcium  8.5 (L) 8.9 - 10.3 mg/dL   GFR, Estimated 50 (L) >60 mL/min    Comment: (NOTE) Calculated using the CKD-EPI Creatinine Equation (2021)    Anion gap 9 5 - 15    Comment: Performed at [facility], [address] [place], [place] [address]  Glucose, capillary     Status: Abnormal   Collection Time: 07/02/24  5:14 PM  Result Value Ref Range   Glucose-Capillary 107 (H) 70 - 99 mg/dL    Comment: Glucose reference range applies only to samples taken after fasting for at least 8 hours.   DG Foot 2 Views Left Result Date: 07/02/2024 CLINICAL DATA:  Cellulitis and abscess of toe EXAM: LEFT FOOT - 2 VIEW COMPARISON:  None Available. FINDINGS: Postoperative findings in the great toe proximal phalanx and metatarsal. Amputation of the second toe at the proximal metaphysis proximal phalanx level. Amputation of the third toe at the distal metaphysis proximal phalanx level. Widened distal interphalangeal joint of the fourth toe, conceivably postoperative or due to joint effusion. Subtle demineralization of the lateral tuft of the distal phalanx fourth toe with overlying soft tissue irregularity and possibly ulceration. These could be signs infection and osteomyelitis of the fourth toe. Substantial arthropathy of the first MTP joint particularly consisting of spurring. Mild dorsal subcutaneous edema along the distal forefoot is nonspecific. Arterial atheromatous vascular calcifications. IMPRESSION: 1. Subtle demineralization of the lateral tuft of the distal phalanx fourth toe with overlying soft tissue irregularity and possibly ulceration. These could be signs of infection and osteomyelitis of the fourth toe. 2. Widened distal interphalangeal joint of the  fourth toe, conceivably postoperative or due to joint effusion. 3. Substantial arthropathy of the first MTP joint. 4. Arterial atheromatous vascular calcifications. Electronically Signed   By: Ryan Salvage M.D.   On: 07/02/2024 15:02    Blood pressure (!) 153/68, pulse 67, temperature 97.9 F (36.6 C), temperature source Oral, resp. rate 16, height 5' 11 (1.803 m), weight 83.9 kg, SpO2 100%.  Assessment L 4th toe osteomyelitis with DFU and cellulitis PVD DM2 with polyneuropathy Chronic pain syndrome  Plan -Patient seen and examined. -X-ray imaging reviewed in clinic showing osteomyelitis to the distal phalanx of the L 4th toe -Appreciated medicine recs for Abx.  Wound culture taken in clinic today. -All treatment options were discussed with the patient of both conservative and surgical attempts at correction including potential risks and complications.  Patient has elected for procedure consisting of L 4th toe amputation.  No guarantees given.  Consent obtained.  No guarantees given. -NPO at 0600 tomorrow 07/03/24 for surgery tomorrow around 1500.    Prentice Lee, DPM 07/02/2024, 6:30 PM

## 2024-07-02 NOTE — H&P (Signed)
 History and Physical    Kenneth Duarte FMW:[id number] DOB: 03/11/1946 DOA: 07/02/2024  DOS: the patient was seen and examined on 07/02/2024  PCP: [name], MD   Patient coming from: Clinic  I have personally briefly reviewed patient's old medical records in [facility]  Chief Complaint: Left third toe infection  HPI: Kenneth Duarte is a pleasant 78 y.o. male with medical history significant for diabetes, HTN, chronic pain syndrome, GERD, HLD, CAD s/p CABG who came in to ED at [facility] from Dr. Honore office for left third toe infection for amputation.  Patient stated that he has this for at least a week or so.  The wound was a small and getting progressively worse and also has a pain and tenderness.  Denies any fever or chills.  Denies any trauma to the foot.  Patient is stated that his diabetes is well-controlled.  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  He stated that he went to Dr. Honore office who advised him to go to the emergency room and get admitted and they will take his toe off.  ED Course: Upon arrival to the ED, patient is found to necrotic third toe on the left side.  They started on Zosyn  and vancomycin  and hospitalist service was consulted for evaluation for admission.  Review of Systems:  ROS  All other systems negative except as noted in the HPI.  Past Medical History:  Diagnosis Date   Arthritis    Atherosclerosis of both carotid arteries    Benign neoplasm of sigmoid colon    Bilateral leg edema    Cardiomyopathy (HCC)    Cellulitis in diabetic foot (HCC)    Chronic pain syndrome    Congestive heart failure (HCC) 1980   COPD (chronic obstructive pulmonary disease) (HCC)    Coronary artery disease    a. s/p 3V CABG (03/2017). b. NSTEMI 06/2017 with progressive graft disease, s/p DES to SVG-OM.   DM (diabetes mellitus), type 2 (HCC)    Dyspnea    with exertion   ED (erectile dysfunction)    GERD (gastroesophageal  reflux disease)    Gout    Headache    Hyperlipemia    Hypertension    CONTROLLED ON MEDS   IDA (iron  deficiency anemia) 10/21/2019   MRSA nasal colonization 2018   MSSA (methicillin susceptible Staphylococcus aureus) 04/17/2024   Neuropathy of both feet    NSTEMI (non-ST elevated myocardial infarction) (HCC)    Osteomyelitis of foot (HCC)    S/P amputation of lesser toe, left (HCC)    S/P CABG x 3    Seborrheic keratosis    Sinus congestion    Stable angina (HCC)    Vertigo    Wears dentures    PARTIAL UPPER    Past Surgical History:  Procedure Laterality Date   AMPUTATION TOE Left 04/18/2024   Procedure: AMPUTATION, TOE, THIRD;  Surgeon: [name], DPM;  Location: [facility] ORS;  Service: Orthopedics/Podiatry;  Laterality: Left;   COLONOSCOPY WITH PROPOFOL  N/A 10/25/2015   Procedure: COLONOSCOPY WITH PROPOFOL ;  Surgeon: Rogelia Copping, MD;  Location: [facility];  Service: Endoscopy;  Laterality: N/A;  DIABETIC-ORAL MEDS   COLONOSCOPY WITH PROPOFOL  N/A 10/22/2020   Procedure: COLONOSCOPY CANCELED;  Surgeon: Copping Rogelia, MD;  Location: [facility];  Service: Endoscopy;  Laterality: N/A;  procedure aborted poor prep   COLONOSCOPY WITH PROPOFOL  N/A 10/25/2020   Procedure: COLONOSCOPY WITH PROPOFOL ;  Surgeon: Copping,  Darren, MD;  Location: [facility];  Service: Endoscopy;  Laterality: N/A;   CORONARY ARTERY BYPASS GRAFT N/A 04/10/2017   Procedure: CORONARY ARTERY BYPASS GRAFTING (CABG) x three , using left internal mammary artery and right leg greater saphenous vein harvested endoscopically;  Surgeon: Maude Fleeta Ochoa, MD;  Location: [facility] OR;  Service: Open Heart Surgery;  Laterality: N/A;   CORONARY ARTERY BYPASS GRAFT     CORONARY STENT INTERVENTION N/A 06/19/2017   Procedure: Coronary Stent Intervention;  Surgeon: Dann Candyce RAMAN, MD;  Location: [facility] INVAS[facility] CV LAB;  Service: Cardiovascular;  Laterality: N/A;   ESOPHAGOGASTRODUODENOSCOPY (EGD) WITH PROPOFOL  N/A  10/22/2020   Procedure: ESOPHAGOGASTRODUODENOSCOPY (EGD) WITH PROPOFOL ;  Surgeon: Jinny Carmine, MD;  Location: [facility];  Service: Endoscopy;  Laterality: N/A;  Diabetic - oral meds   EXCISION PARTIAL PHALANX Right 03/22/2023   Procedure: EXCISION  PHALANX;  Surgeon: [name], DPM;  Location: [facility];  Service: Podiatry;  Laterality: Right;   INCISION AND DRAINAGE Right 04/19/2023   Procedure: INCISION AND DRAINAGE;  Surgeon: Neill Boas, DPM;  Location: [facility];  Service: Podiatry;  Laterality: Right;   IRRIGATION AND DEBRIDEMENT FOOT Right 03/22/2023   Procedure: IRRIGATION AND DEBRIDEMENT FOOT;  Surgeon: [name], DPM;  Location: [facility];  Service: Podiatry;  Laterality: Right;   KNEE SURGERY Right    over 20 years ago   LEFT HEART CATH AND CORONARY ANGIOGRAPHY Left 02/22/2017   Procedure: Left Heart Cath and Coronary Angiography;  Surgeon: Wolm JINNY Rhyme, MD;  Location: [facility] INVASIVE CV LAB;  Service: Cardiovascular;  Laterality: Left;   LEFT HEART CATH AND CORS/GRAFTS ANGIOGRAPHY N/A 06/18/2017   Procedure: Left Heart Cath and Cors/Grafts Angiography and PCI;  Surgeon: Ammon Blunt, MD;  Location: [facility] INVAS[facility] CV LAB;  Service: Cardiovascular;  Laterality: N/A;   LOWER EXTREMITY ANGIOGRAPHY Right 03/23/2023   Procedure: Lower Extremity Angiography;  Surgeon: Marea Selinda RAMAN, MD;  Location: [facility] INV[facility] CV LAB;  Service: Cardiovascular;  Laterality: Right;   LOWER EXTREMITY INTERVENTION  03/23/2023   Procedure: LOWER EXTREMITY INTERVENTION;  Surgeon: Marea Selinda RAMAN, MD;  Location: [facility] INV[facility] CV LAB;  Service: Cardiovascular;;   POLYPECTOMY  10/25/2015   Procedure: POLYPECTOMY;  Surgeon: Carmine Jinny, MD;  Location: [facility];  Service: Endoscopy;;   RIGHT/LEFT HEART CATH AND CORONARY/GRAFT ANGIOGRAPHY Bilateral 08/31/2023   Procedure: RIGHT/LEFT HEART CATH AND CORONARY/GRAFT ANGIOGRAPHY;  Surgeon: Darron Deatrice LABOR, MD;  Location: [facility] INVASIVE CV LAB;  Service:  Cardiovascular;  Laterality: Bilateral;   ROTATOR CUFF REPAIR Right 2012   TEE WITHOUT CARDIOVERSION N/A 04/10/2017   Procedure: TRANSESOPHAGEAL ECHOCARDIOGRAM (TEE);  Surgeon: Maude Fleeta Ochoa, MD;  Location: [facility] OR;  Service: Open Heart Surgery;  Laterality: N/A;     reports that he quit smoking about 45 years ago. His smoking use included cigarettes. He started smoking about 60 years ago. He has a 30 pack-year smoking history. He has never used smokeless tobacco. He reports that he does not drink alcohol and does not use drugs.  Allergies  Allergen Reactions   Blood-Group Specific Substance Other (See Comments)    Patient received almost 2 units FFP during a procedure. Became hypotensive and developed rash   Cortisone Acetate [Cortisone] Rash and Other (See Comments)    The patient feels that he can take this, he not sure why this was put on his file    Family History  Problem Relation Age of Onset   Cancer Mother    Cancer  Father    Heart disease Father    Heart attack Father    Diabetes Sister    Heart disease Brother    Diabetes Brother    Prostate cancer Brother    Bladder Cancer Neg Hx    Kidney disease Neg Hx     Prior to Admission medications   Medication Sig Start Date End Date Taking? Authorizing Provider  aspirin  81 MG tablet Take 1 tablet (81 mg total) by mouth daily. 06/20/17  Yes Dunn, Dayna N, PA-C  atorvastatin  (LIPITOR ) 80 MG tablet TAKE 1 TABLET AT BEDTIME 04/14/24  Yes Gollan, Timothy J, MD  carvedilol  (COREG ) 3.125 MG tablet Take 3.125 mg by mouth 2 (two) times daily. 01/01/24  Yes [provider]  clopidogrel  (PLAVIX ) 75 MG tablet TAKE 1 TABLET EVERY DAY 04/14/24  Yes Gollan, Timothy J, MD  dapagliflozin  propanediol (FARXIGA ) 10 MG TABS tablet Take 1 tablet (10 mg total) by mouth daily. 03/25/24  Yes Dunn, Ryan M, PA-C  DENTA 5000 PLUS 1.1 % CREA dental cream Place 1 Application onto teeth at bedtime. 11/12/23  Yes [provider]  ENTRESTO  24-26  MG TAKE 1 TABLET TWICE DAILY 11/21/23  Yes Gollan, Timothy J, MD  gabapentin  (NEURONTIN ) 800 MG tablet Take 1,600 mg by mouth 3 (three) times daily.   Yes [provider]  hydrALAZINE  (APRESOLINE ) 25 MG tablet Take 1 tablet (25 mg total) by mouth 3 (three) times daily as needed. For blood pressure greater then 160. Patient taking differently: Take 25 mg by mouth 3 (three) times daily as needed (For blood pressure greater then 160.). 11/12/23 04/25/25 Yes Gollan, Timothy J, MD  meclizine  (ANTIVERT ) 25 MG tablet TAKE 1 TABLET TWICE DAILY AS NEEDED FOR DIZZINESS 06/16/24  Yes Dunn, Dayna N, PA-C  metFORMIN  (GLUCOPHAGE -XR) 500 MG 24 hr tablet Take 500 mg by mouth daily. 05/29/17  Yes [provider]  nitroGLYCERIN  (NITROSTAT ) 0.4 MG SL tablet Place 1 tablet (0.4 mg total) under the tongue every 5 (five) minutes as needed for chest pain. 05/11/23  Yes Dunn, Bernardino HERO, PA-C  oxyCODONE -acetaminophen  (PERCOCET) 10-325 MG tablet Take 1 tablet by mouth See admin instructions. Every 3-4 hours   Yes [provider]  pantoprazole  (PROTONIX ) 40 MG tablet TAKE 1 TABLET EVERY DAY 06/16/24  Yes Dunn, Dayna N, PA-C  vitamin B-12 (CYANOCOBALAMIN ) 1000 MCG tablet Take 1,000 mcg by mouth daily. 09/17/19  Yes [provider]  VITAMIN D -1000 MAX ST 25 MCG (1000 UT) tablet Take 1,000 Units by mouth daily. 07/22/20  Yes [provider]    Physical Exam: Vitals:   07/02/24 1044 07/02/24 1045  BP:  (!) 104/56  Pulse: 64   Resp: 17   Temp: 97.9 F (36.6 C)   TempSrc: Oral   SpO2: 100%   Weight: 83.9 kg   Height: 5' 11 (1.803 m)     Physical Exam   Constitutional: Alert, awake, calm, comfortable HEENT: Neck supple Respiratory: Clear to auscultation B/L, no wheezing, no rales.  Cardiovascular: Regular rate and rhythm, no murmurs / rubs / gallops. No extremity edema. 2+ pedal pulses. No carotid bruits.  Abdomen: Soft, no tenderness, Bowel sounds positive.  Musculoskeletal: no  clubbing / cyanosis. Good ROM, no contractures. Normal muscle tone.  Skin: Left 2 toes are missing and third toe is infected Neurologic: CN 2-12 grossly intact. Sensation intact, No focal deficit identified Psychiatric: Alert and oriented x 3. Normal mood.    Labs on Admission: I have personally reviewed following  labs and imaging studies  CBC: Recent Labs  Lab 07/02/24 1048  WBC 8.2  NEUTROABS 6.1  HGB 11.2*  HCT 34.7*  MCV 89.0  PLT 228   Basic Metabolic Panel: Recent Labs  Lab 07/02/24 1048  NA 139  K 4.1  CL 105  CO2 25  GLUCOSE 158*  BUN 16  CREATININE 1.46*  CALCIUM  8.5*   Urine analysis:    Component Value Date/Time   COLORURINE YELLOW (A) 04/18/2023 1819   APPEARANCEUR CLEAR (A) 04/18/2023 1819   APPEARANCEUR Clear 03/06/2014 1708   LABSPEC 1.010 04/18/2023 1819   LABSPEC 1.002 03/06/2014 1708   PHURINE 5.0 04/18/2023 1819   GLUCOSEU NEGATIVE 04/18/2023 1819   GLUCOSEU Negative 03/06/2014 1708   HGBUR NEGATIVE 04/18/2023 1819   BILIRUBINUR NEGATIVE 04/18/2023 1819   BILIRUBINUR Negative 03/06/2014 1708   KETONESUR NEGATIVE 04/18/2023 1819   PROTEINUR NEGATIVE 04/18/2023 1819   NITRITE NEGATIVE 04/18/2023 1819   LEUKOCYTESUR NEGATIVE 04/18/2023 1819   LEUKOCYTESUR Negative 03/06/2014 1708    Radiological Exams on Admission: I have personally reviewed images No results found.  EKG: My personal interpretation of EKG shows: N/A    Assessment/Plan Principal Problem:   Diabetic foot infection (HCC)    Assessment and Plan:  78 year old male with history of diabetes now presented from podiatrist office for left foot infection for possible toe amputation  1.  Diabetic foot, left infection with 2 missing toes - He will be admitted to hospital as inpatient for possible wound infection in a diabetic patient - Podiatrist Dr. [name] sent the patient to the hospital. - Request for consult has been placed. - Patient received Zosyn  and vancomycin  in the  emergency room. - Will continue Zosyn  and vancomycin  pharmacy to dose. - Will give him pain medications - Continue to hold aspirin  and Plavix  as patient is being planned for surgery - Resume aspirin  and Plavix  after surgery.  2. Type 2 diabetes - Patient says he has good A1c around 6 - Will stop his oral hypoglycemic medication while he is in the hospital and place him on insulin  sliding scale.  3.  HTN - Blood pressure is stable Continue home regimen with Entresto , Coreg  and also hydralazine  as needed  4.  HLD - Continue statin    DVT prophylaxis: Lovenox  Code Status: Full Code Family Communication: None available  Disposition Plan: Home  Consults called: Podiatry  Admission status: Inpatient, Med-Surg   Nena Rebel, MD [facility] 07/02/2024, 12:58 PM

## 2024-07-02 NOTE — ED Triage Notes (Signed)
 Patient to ED via POV. Sent by Dr [name] for amputation of left third toe. Had x-ray at office this AM.

## 2024-07-02 NOTE — ED Provider Notes (Signed)
 [facility] Provider Note    Event Date/Time   First MD Initiated Contact with Patient 07/02/24 1106     (approximate)   History   Chief Complaint: Foot Pain   HPI  Kenneth Duarte is a 78 y.o. male with a history of CHF COPD diabetes who was sent to the ED by podiatry Dr. [name] due to necrotic toe requiring amputation.  Patient denies fever chills chest pain shortness of breath.  Request his oxycodone  that he takes for chronic pain       Past Medical History:  Diagnosis Date   Arthritis    Atherosclerosis of both carotid arteries    Benign neoplasm of sigmoid colon    Bilateral leg edema    Cardiomyopathy (HCC)    Cellulitis in diabetic foot (HCC)    Chronic pain syndrome    Congestive heart failure (HCC) 1980   COPD (chronic obstructive pulmonary disease) (HCC)    Coronary artery disease    a. s/p 3V CABG (03/2017). b. NSTEMI 06/2017 with progressive graft disease, s/p DES to SVG-OM.   DM (diabetes mellitus), type 2 (HCC)    Dyspnea    with exertion   ED (erectile dysfunction)    GERD (gastroesophageal reflux disease)    Gout    Headache    Hyperlipemia    Hypertension    CONTROLLED ON MEDS   IDA (iron  deficiency anemia) 10/21/2019   MRSA nasal colonization 2018   MSSA (methicillin susceptible Staphylococcus aureus) 04/17/2024   Neuropathy of both feet    NSTEMI (non-ST elevated myocardial infarction) (HCC)    Osteomyelitis of foot (HCC)    S/P amputation of lesser toe, left (HCC)    S/P CABG x 3    Seborrheic keratosis    Sinus congestion    Stable angina (HCC)    Vertigo    Wears dentures    PARTIAL UPPER    Current Outpatient Rx   Order #: [id number] Class: Normal   Order #: [id number] Class: Normal   Order #: [id number] Class: Historical Med   Order #: [id number] Class: Normal   Order #: [id number] Class: Normal   Order #: [id number] Class: Historical Med   Order #: [id number] Class: Normal   Order #: [id number] Class: Historical Med    Order #: [id number] Class: Normal   Order #: [id number] Class: Normal   Order #: [id number] Class: Historical Med   Order #: [id number] Class: Normal   Order #: [id number] Class: Historical Med   Order #: [id number] Class: Normal   Order #: [id number] Class: Historical Med   Order #: [id number] Class: Historical Med    Past Surgical History:  Procedure Laterality Date   AMPUTATION TOE Left 04/18/2024   Procedure: AMPUTATION, TOE, THIRD;  Surgeon: [name], DPM;  Location: [facility] OR[facility];  Service: Orthopedics/Podiatry;  Laterality: Left;   COLONOSCOPY WITH PROPOFOL  N/A 10/25/2015   Procedure: COLONOSCOPY WITH PROPOFOL ;  Surgeon: Rogelia Copping, MD;  Location: [facility];  Service: Endoscopy;  Laterality: N/A;  DIABETIC-ORAL MEDS   COLONOSCOPY WITH PROPOFOL  N/A 10/22/2020   Procedure: COLONOSCOPY CANCELED;  Surgeon: Copping Rogelia, MD;  Location: [facility];  Service: Endoscopy;  Laterality: N/A;  procedure aborted poor prep   COLONOSCOPY WITH PROPOFOL  N/A 10/25/2020   Procedure: COLONOSCOPY WITH PROPOFOL ;  Surgeon: Copping Rogelia, MD;  Location: [facility];  Service: Endoscopy;  Laterality: N/A;   CORONARY ARTERY BYPASS GRAFT N/A 04/10/2017   Procedure: CORONARY ARTERY BYPASS GRAFTING (CABG) x three , using left internal mammary artery and right leg greater saphenous vein  harvested endoscopically;  Surgeon: Maude Fleeta Ochoa, MD;  Location: [facility] OR;  Service: Open Heart Surgery;  Laterality: N/A;   CORONARY ARTERY BYPASS GRAFT     CORONARY STENT INTERVENTION N/A 06/19/2017   Procedure: Coronary Stent Intervention;  Surgeon: Dann Candyce RAMAN, MD;  Location: [facility] INVASIVE CV LAB;  Service: Cardiovascular;  Laterality: N/A;   ESOPHAGOGASTRODUODENOSCOPY (EGD) WITH PROPOFOL  N/A 10/22/2020   Procedure: ESOPHAGOGASTRODUODENOSCOPY (EGD) WITH PROPOFOL ;  Surgeon: Jinny Carmine, MD;  Location: [facility];  Service: Endoscopy;  Laterality: N/A;  Diabetic - oral meds   EXCISION PARTIAL PHALANX Right  03/22/2023   Procedure: EXCISION  PHALANX;  Surgeon: [name], DPM;  Location: [facility];  Service: Podiatry;  Laterality: Right;   INCISION AND DRAINAGE Right 04/19/2023   Procedure: INCISION AND DRAINAGE;  Surgeon: Neill Boas, DPM;  Location: [facility] OR[facility];  Service: Podiatry;  Laterality: Right;   IRRIGATION AND DEBRIDEMENT FOOT Right 03/22/2023   Procedure: IRRIGATION AND DEBRIDEMENT FOOT;  Surgeon: [name], DPM;  Location: [facility];  Service: Podiatry;  Laterality: Right;   KNEE SURGERY Right    over 20 years ago   LEFT HEART CATH AND CORONARY ANGIOGRAPHY Left 02/22/2017   Procedure: Left Heart Cath and Coronary Angiography;  Surgeon: Wolm JINNY Rhyme, MD;  Location: [facility] INVASIVE CV LAB;  Service: Cardiovascular;  Laterality: Left;   LEFT HEART CATH AND CORS/GRAFTS ANGIOGRAPHY N/A 06/18/2017   Procedure: Left Heart Cath and Cors/Grafts Angiography and PCI;  Surgeon: Ammon Blunt, MD;  Location: [facility] INVASIVE CV LAB;  Service: Cardiovascular;  Laterality: N/A;   LOWER EXTREMITY ANGIOGRAPHY Right 03/23/2023   Procedure: Lower Extremity Angiography;  Surgeon: Marea Selinda RAMAN, MD;  Location: [facility] INVAS[facility] CV LAB;  Service: Cardiovascular;  Laterality: Right;   LOWER EXTREMITY INTERVENTION  03/23/2023   Procedure: LOWER EXTREMITY INTERVENTION;  Surgeon: Marea Selinda RAMAN, MD;  Location: [facility] INVASIVE CV LAB;  Service: Cardiovascular;;   POLYPECTOMY  10/25/2015   Procedure: POLYPECTOMY;  Surgeon: Carmine Jinny, MD;  Location: [facility];  Service: Endoscopy;;   RIGHT/LEFT HEART CATH AND CORONARY/GRAFT ANGIOGRAPHY Bilateral 08/31/2023   Procedure: RIGHT/LEFT HEART CATH AND CORONARY/GRAFT ANGIOGRAPHY;  Surgeon: Darron Deatrice LABOR, MD;  Location: [facility] INVASIVE CV LAB;  Service: Cardiovascular;  Laterality: Bilateral;   ROTATOR CUFF REPAIR Right 2012   TEE WITHOUT CARDIOVERSION N/A 04/10/2017   Procedure: TRANSESOPHAGEAL ECHOCARDIOGRAM (TEE);  Surgeon: Maude Fleeta Ochoa, MD;  Location: [facility] OR;  Service:  Open Heart Surgery;  Laterality: N/A;    Physical Exam   Triage Vital Signs: ED Triage Vitals  Encounter Vitals Group     BP 07/02/24 1045 (!) 104/56     Girls Systolic BP Percentile --      Girls Diastolic BP Percentile --      Boys Systolic BP Percentile --      Boys Diastolic BP Percentile --      Pulse Rate 07/02/24 1044 64     Resp 07/02/24 1044 17     Temp 07/02/24 1044 97.9 F (36.6 C)     Temp Source 07/02/24 1044 Oral     SpO2 07/02/24 1044 100 %     Weight 07/02/24 1044 185 lb (83.9 kg)     Height 07/02/24 1044 5' 11 (1.803 m)     Head Circumference --      Peak Flow --      Pain Score 07/02/24 1044 6     Pain Loc --      Pain Education --  Exclude from Growth Chart --     Most recent vital signs: Vitals:   07/02/24 1412 07/02/24 1430  BP:  (!) 151/63  Pulse:  (!) 57  Resp:    Temp: 97.7 F (36.5 C)   SpO2:  100%    General: Awake, no distress.  CV:  Good peripheral perfusion.  Regular rate rhythm Resp:  Normal effort.  Abd:  No distention.  Other:  Left fourth toe with macerated wound, necrosis, edema.  There is edema of the foot more broadly.   ED Results / Procedures / Treatments   Labs (all labs ordered are listed, but only abnormal results are displayed) Labs Reviewed  CBC WITH DIFFERENTIAL/PLATELET - Abnormal; Notable for the following components:      Result Value   RBC 3.90 (*)    Hemoglobin 11.2 (*)    HCT 34.7 (*)    All other components within normal limits  BASIC METABOLIC PANEL WITH GFR - Abnormal; Notable for the following components:   Glucose, Bld 158 (*)    Creatinine, Ser 1.46 (*)    Calcium  8.5 (*)    GFR, Estimated 50 (*)    All other components within normal limits  CBC  CREATININE, SERUM  HEMOGLOBIN A1C     EKG    RADIOLOGY X-ray left interpreted by me, no fracture or obvious bony erosion.  Radiology report reviewed   PROCEDURES:  Procedures   MEDICATIONS ORDERED IN ED: Medications  vancomycin   (VANCOREADY) IVPB 1500 mg/300 mL (1,500 mg Intravenous New Bag/Given 07/02/24 1334)  enoxaparin  (LOVENOX ) injection 40 mg (has no administration in time range)  0.9 %  sodium chloride  infusion ( Intravenous New Bag/Given 07/02/24 1233)  acetaminophen  (TYLENOL ) tablet 650 mg (has no administration in time range)    Or  acetaminophen  (TYLENOL ) suppository 650 mg (has no administration in time range)  HYDROmorphone  (DILAUDID ) injection 0.5-1 mg (1 mg Intravenous Given 07/02/24 1302)  polyethylene glycol (MIRALAX  / GLYCOLAX ) packet 17 g (has no administration in time range)  ondansetron  (ZOFRAN ) tablet 4 mg (has no administration in time range)    Or  ondansetron  (ZOFRAN ) injection 4 mg (has no administration in time range)  insulin  aspart (novoLOG ) injection 0-9 Units (has no administration in time range)  insulin  aspart (novoLOG ) injection 0-5 Units (has no administration in time range)  hydrALAZINE  (APRESOLINE ) injection 10 mg (has no administration in time range)  carvedilol  (COREG ) tablet 3.125 mg (has no administration in time range)  atorvastatin  (LIPITOR ) tablet 80 mg (has no administration in time range)  sacubitril -valsartan  (ENTRESTO ) 24-26 mg per tablet (has no administration in time range)  vancomycin  (VANCOREADY) IVPB 1250 mg/250 mL (has no administration in time range)  piperacillin -tazobactam (ZOSYN ) IVPB 3.375 g (0 g Intravenous Stopped 07/02/24 1333)     IMPRESSION / MDM / ASSESSMENT AND PLAN / ED COURSE  I reviewed the triage vital signs and the nursing notes.  DDx: Osteomyelitis, cellulitis, foot abscess, AKI  Patient's presentation is most consistent with acute presentation with potential threat to life or bodily function.  Presents with necrotic toe with evidence of osteomyelitis, risk of Pseudomonas.  Will start vancomycin , Zosyn , contact hospitalist for admission.   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   Viviann Pastor, MD 07/02/24 [phone]

## 2024-07-03 ENCOUNTER — Inpatient Hospital Stay: Admitting: Anesthesiology

## 2024-07-03 ENCOUNTER — Other Ambulatory Visit: Payer: Self-pay

## 2024-07-03 ENCOUNTER — Encounter
Admission: EM | Disposition: A | Discharge status: Home / Self Care | Payer: Self-pay | Source: Ambulatory Visit | Attending: Hospitalist

## 2024-07-03 DIAGNOSIS — E119 Type 2 diabetes mellitus without complications: Secondary | ICD-10-CM | POA: Diagnosis not present

## 2024-07-03 DIAGNOSIS — M869 Osteomyelitis, unspecified: Secondary | ICD-10-CM

## 2024-07-03 DIAGNOSIS — E1142 Type 2 diabetes mellitus with diabetic polyneuropathy: Secondary | ICD-10-CM | POA: Diagnosis not present

## 2024-07-03 DIAGNOSIS — D649 Anemia, unspecified: Secondary | ICD-10-CM

## 2024-07-03 DIAGNOSIS — I5022 Chronic systolic (congestive) heart failure: Secondary | ICD-10-CM | POA: Diagnosis not present

## 2024-07-03 DIAGNOSIS — E785 Hyperlipidemia, unspecified: Secondary | ICD-10-CM

## 2024-07-03 DIAGNOSIS — I10 Essential (primary) hypertension: Secondary | ICD-10-CM

## 2024-07-03 DIAGNOSIS — E11628 Type 2 diabetes mellitus with other skin complications: Secondary | ICD-10-CM | POA: Diagnosis not present

## 2024-07-03 HISTORY — PX: AMPUTATION TOE: SHX6595

## 2024-07-03 LAB — BASIC METABOLIC PANEL WITH GFR
Anion gap: 7 (ref 5–15)
BUN: 12 mg/dL (ref 8–23)
CO2: 23 mmol/L (ref 22–32)
Calcium: 8.2 mg/dL — ABNORMAL LOW (ref 8.9–10.3)
Chloride: 109 mmol/L (ref 98–111)
Creatinine, Ser: 1.12 mg/dL (ref 0.61–1.24)
GFR, Estimated: >60 mL/min (ref >60)
Glucose, Bld: 121 mg/dL — ABNORMAL HIGH (ref 70–99)
Potassium: 3.8 mmol/L (ref 3.5–5.1)
Sodium: 139 mmol/L (ref 135–145)

## 2024-07-03 LAB — GLUCOSE, CAPILLARY
Glucose-Capillary: 127 mg/dL — ABNORMAL HIGH (ref 70–99)
Glucose-Capillary: 101 mg/dL — ABNORMAL HIGH (ref 70–99)
Glucose-Capillary: 98 mg/dL (ref 70–99)
Glucose-Capillary: 86 mg/dL (ref 70–99)
Glucose-Capillary: 108 mg/dL — ABNORMAL HIGH (ref 70–99)

## 2024-07-03 LAB — CBC
HCT: 31 % — ABNORMAL LOW (ref 39.0–52.0)
Hemoglobin: 10 g/dL — ABNORMAL LOW (ref 13.0–17.0)
MCH: 28.2 pg (ref 26.0–34.0)
MCHC: 32.3 g/dL (ref 30.0–36.0)
MCV: 87.3 fL (ref 80.0–100.0)
Platelets: 188 K/uL (ref 150–400)
RBC: 3.55 MIL/uL — ABNORMAL LOW (ref 4.22–5.81)
RDW: 14.7 % (ref 11.5–15.5)
WBC: 5.9 K/uL (ref 4.0–10.5)
nRBC: 0 % (ref 0.0–0.2)

## 2024-07-03 LAB — HEMOGLOBIN A1C
Hgb A1c MFr Bld: 6 % — ABNORMAL HIGH (ref 4.8–5.6)
Mean Plasma Glucose: 125.5 mg/dL

## 2024-07-03 LAB — PROTIME-INR
INR: 1.1 (ref 0.8–1.2)
Prothrombin Time: 15.3 s — ABNORMAL HIGH (ref 11.4–15.2)

## 2024-07-03 SURGERY — AMPUTATION, TOE
Anesthesia: General | Site: Toe | Laterality: Left

## 2024-07-03 MED ORDER — OXYCODONE HCL 5 MG PO TABS: 5.0000 mg | ORAL_TABLET | Freq: Once | ORAL | Status: DC | PRN

## 2024-07-03 MED ORDER — VANCOMYCIN HCL 1000 MG IV SOLR
INTRAVENOUS | Status: AC
  Filled 2024-07-03: qty 20

## 2024-07-03 MED ORDER — BUPIVACAINE HCL 0.5 % IJ SOLN
INTRAMUSCULAR | Status: DC | PRN
  Administered 2024-07-03: 10 mL

## 2024-07-03 MED ORDER — ONDANSETRON HCL 4 MG/2ML IJ SOLN
INTRAMUSCULAR | Status: DC | PRN
Start: 2024-07-03 — End: 2024-07-03
  Administered 2024-07-03: 4 mg via INTRAVENOUS

## 2024-07-03 MED ORDER — 0.9 % SODIUM CHLORIDE (POUR BTL) OPTIME
TOPICAL | Status: DC | PRN
  Administered 2024-07-03: 500 mL

## 2024-07-03 MED ORDER — BUPIVACAINE HCL (PF) 0.5 % IJ SOLN
INTRAMUSCULAR | Status: AC
  Filled 2024-07-03: qty 30

## 2024-07-03 MED ORDER — HYDRALAZINE HCL 25 MG PO TABS: 25.0000 mg | ORAL_TABLET | Freq: Three times a day (TID) | ORAL | Status: AC | PRN

## 2024-07-03 MED ORDER — DEXAMETHASONE SODIUM PHOSPHATE 10 MG/ML IJ SOLN
INTRAMUSCULAR | Status: AC
  Filled 2024-07-03: qty 1

## 2024-07-03 MED ORDER — ONDANSETRON HCL 4 MG/2ML IJ SOLN
INTRAMUSCULAR | Status: AC
  Filled 2024-07-03: qty 2

## 2024-07-03 MED ORDER — PHENYLEPHRINE HCL (PRESSORS) 10 MG/ML IV SOLN
INTRAVENOUS | Status: DC | PRN
  Administered 2024-07-03 (×2): 80 ug via INTRAVENOUS

## 2024-07-03 MED ORDER — VANCOMYCIN HCL 750 MG/150ML IV SOLN
750.0000 mg | Freq: Two times a day (BID) | INTRAVENOUS | Status: DC
  Administered 2024-07-03: 750 mg via INTRAVENOUS
  Filled 2024-07-03 (×2): qty 150

## 2024-07-03 MED ORDER — MIDAZOLAM HCL 2 MG/2ML IJ SOLN
INTRAMUSCULAR | Status: AC
  Filled 2024-07-03: qty 2

## 2024-07-03 MED ORDER — DOXYCYCLINE HYCLATE 100 MG PO CAPS
100.0000 mg | ORAL_CAPSULE | Freq: Two times a day (BID) | ORAL | 0 refills | Status: AC
Start: 2024-07-03 — End: 2024-07-10

## 2024-07-03 MED ORDER — PROPOFOL 10 MG/ML IV BOLUS
INTRAVENOUS | Status: AC
  Filled 2024-07-03: qty 20

## 2024-07-03 MED ORDER — DEXMEDETOMIDINE HCL IN NACL 80 MCG/20ML IV SOLN
INTRAVENOUS | Status: AC
  Filled 2024-07-03: qty 20

## 2024-07-03 MED ORDER — LIDOCAINE HCL (PF) 2 % IJ SOLN
INTRAMUSCULAR | Status: AC
  Filled 2024-07-03: qty 5

## 2024-07-03 MED ORDER — PROPOFOL 500 MG/50ML IV EMUL
INTRAVENOUS | Status: DC | PRN
  Administered 2024-07-03: 100 mg via INTRAVENOUS
  Administered 2024-07-03: 50 mg via INTRAVENOUS
  Administered 2024-07-03: 75 ug/kg/min via INTRAVENOUS

## 2024-07-03 MED ORDER — FENTANYL CITRATE (PF) 100 MCG/2ML IJ SOLN
INTRAMUSCULAR | Status: DC | PRN
  Administered 2024-07-03: 75 ug via INTRAVENOUS
  Administered 2024-07-03: 25 ug via INTRAVENOUS

## 2024-07-03 MED ORDER — MIDAZOLAM HCL 2 MG/2ML IJ SOLN
INTRAMUSCULAR | Status: DC | PRN
  Administered 2024-07-03: 1 mg via INTRAVENOUS

## 2024-07-03 MED ORDER — LACTATED RINGERS IV SOLN: INTRAVENOUS | Status: DC | PRN

## 2024-07-03 MED ORDER — FENTANYL CITRATE (PF) 100 MCG/2ML IJ SOLN: 25.0000 ug | INTRAMUSCULAR | Status: DC | PRN

## 2024-07-03 MED ORDER — OXYCODONE HCL 5 MG/5ML PO SOLN: 5.0000 mg | Freq: Once | ORAL | Status: DC | PRN

## 2024-07-03 MED ORDER — FENTANYL CITRATE (PF) 100 MCG/2ML IJ SOLN
INTRAMUSCULAR | Status: AC
  Filled 2024-07-03: qty 2

## 2024-07-03 MED ORDER — DEXMEDETOMIDINE HCL IN NACL 80 MCG/20ML IV SOLN
INTRAVENOUS | Status: DC | PRN
Start: 2024-07-03 — End: 2024-07-03
  Administered 2024-07-03 (×3): 4 ug via INTRAVENOUS

## 2024-07-03 MED ORDER — AMOXICILLIN-POT CLAVULANATE 875-125 MG PO TABS: 1.0000 | ORAL_TABLET | Freq: Two times a day (BID) | ORAL | 0 refills | Status: AC

## 2024-07-03 SURGICAL SUPPLY — 38 items
BLADE MED AGGRESSIVE (BLADE) ×1 IMPLANT
BLADE OSC/SAGITTAL MD 5.5X18 (BLADE) IMPLANT
BLADE SURG 15 STRL LF DISP TIS (BLADE) IMPLANT
BLADE SURG MINI STRL (BLADE) IMPLANT
BNDG ELASTIC 4INX 5YD STR LF (GAUZE/BANDAGES/DRESSINGS) ×1 IMPLANT
BNDG ESMARCH 4X12 STRL LF (GAUZE/BANDAGES/DRESSINGS) ×1 IMPLANT
BNDG GAUZE DERMACEA FLUFF 4 (GAUZE/BANDAGES/DRESSINGS) ×1 IMPLANT
CNTNR URN SCR LID CUP LEK RST (MISCELLANEOUS) ×1 IMPLANT
CUFF TOURN SGL QUICK 12 (TOURNIQUET CUFF) IMPLANT
CUFF TOURN SGL QUICK 18X4 (TOURNIQUET CUFF) IMPLANT
DRAPE FLUOR MINI C-ARM 54X84 (DRAPES) IMPLANT
DURAPREP 26ML APPLICATOR (WOUND CARE) ×1 IMPLANT
ELECTRODE REM PT RTRN 9FT ADLT (ELECTROSURGICAL) ×1 IMPLANT
GAUZE SPONGE 4X4 12PLY STRL (GAUZE/BANDAGES/DRESSINGS) ×2 IMPLANT
GAUZE STRETCH 2X75IN STRL (MISCELLANEOUS) IMPLANT
GAUZE XEROFORM 1X8 LF (GAUZE/BANDAGES/DRESSINGS) ×1 IMPLANT
GLOVE BIO SURGEON STRL SZ7 (GLOVE) ×1 IMPLANT
GLOVE INDICATOR 7.0 STRL GRN (GLOVE) ×1 IMPLANT
GOWN STRL REUS W/ TWL LRG LVL3 (GOWN DISPOSABLE) ×2 IMPLANT
HANDPIECE VERSAJET DEBRIDEMENT (MISCELLANEOUS) IMPLANT
KIT TURNOVER KIT A (KITS) ×1 IMPLANT
LABEL OR SOLS (LABEL) ×1 IMPLANT
MANIFOLD NEPTUNE II (INSTRUMENTS) ×1 IMPLANT
NDL FILTER BLUNT 18X1 1/2 (NEEDLE) ×1 IMPLANT
NDL HYPO 25X1 1.5 SAFETY (NEEDLE) ×2 IMPLANT
NEEDLE FILTER BLUNT 18X1 1/2 (NEEDLE) ×1 IMPLANT
NEEDLE HYPO 25X1 1.5 SAFETY (NEEDLE) ×2 IMPLANT
NS IRRIG 500ML POUR BTL (IV SOLUTION) ×1 IMPLANT
PACK EXTREMITY ARMC (MISCELLANEOUS) ×1 IMPLANT
PAD ABD DERMACEA PRESS 5X9 (GAUZE/BANDAGES/DRESSINGS) IMPLANT
PENCIL SMOKE EVACUATOR (MISCELLANEOUS) ×1 IMPLANT
SOLUTION PREP PVP 2OZ (MISCELLANEOUS) ×1 IMPLANT
STOCKINETTE STRL 6IN 960660 (GAUZE/BANDAGES/DRESSINGS) ×1 IMPLANT
SUT VIC AB 3-0 SH 27X BRD (SUTURE) ×1 IMPLANT
SUTURE EHLN 3-0 FS-10 30 BLK (SUTURE) ×2 IMPLANT
SWAB CULTURE AMIES ANAERIB BLU (MISCELLANEOUS) IMPLANT
SYR 10ML LL (SYRINGE) ×1 IMPLANT
TRAP FLUID SMOKE EVACUATOR (MISCELLANEOUS) ×1 IMPLANT

## 2024-07-03 NOTE — Assessment & Plan Note (Addendum)
 Can go back on metformin  as outpatient.  Continue Farxiga .

## 2024-07-03 NOTE — Discharge Instructions (Signed)
 [facility] SURGERY CENTER  POST OPERATIVE INSTRUCTIONS FOR DR. ASHLEY AND DR. Ugochukwu Chichester [facility] CLINIC PODIATRY DEPARTMENT   Take your medication as prescribed.  Pain medication should be taken only as needed.  Take antibiotics as prescribed until gone.  Keep the dressing clean, dry and intact.  Keep your foot elevated above the heart level for the first 48 hours.  Continue elevation thereafter to improve swelling.  Walking to the bathroom and brief periods of walking are acceptable, unless we have instructed you to be non-weight bearing.  Always wear your post-op shoe when walking.  Always use your crutches if you are to be non-weight bearing.  Do not take a shower. Baths are permissible as long as the foot is kept out of the water .   Every hour you are awake:  Bend your knee 15 times. Flex foot 15 times Massage calf 15 times  Call [facility] [phone]) if any of the following problems occur: You develop a temperature or fever. The bandage becomes saturated with blood. Medication does not stop your pain. Injury of the foot occurs. Any symptoms of infection including redness, odor, or red streaks running from wound.

## 2024-07-03 NOTE — Assessment & Plan Note (Signed)
 Left fourth toe amputation today.  Switch IV antibiotics to oral antibiotics doxycycline  and Augmentin  upon discharge.

## 2024-07-03 NOTE — Transfer of Care (Signed)
 Immediate Anesthesia Transfer of Care Note  Patient: Kenneth Duarte  Procedure(s) Performed: AMPUTATION, TOE (Left: Toe)  Patient Location: PACU  Anesthesia Type:MAC  Level of Consciousness: awake, alert , and oriented  Airway & Oxygen Therapy: Patient Spontanous Breathing and Patient connected to face mask oxygen  Post-op Assessment: Report given to RN and Post -op Vital signs reviewed and stable  Post vital signs: stable  Last Vitals:  Vitals Value Taken Time  BP 98/53 07/03/24 15:06  Temp    Pulse 60 07/03/24 15:09  Resp 17 07/03/24 15:09  SpO2 100 % 07/03/24 15:09  Vitals shown include unfiled device data.  Last Pain:  Vitals:   07/03/24 1305  TempSrc: Temporal  PainSc: 2       Patients Stated Pain Goal: 2 (07/03/24 0943)  Complications: No notable events documented.

## 2024-07-03 NOTE — Plan of Care (Signed)
  Problem: Education: Goal: Ability to describe self-care measures that may prevent or decrease complications (Diabetes Survival Skills Education) will improve 07/03/2024 1826 by Lynnette Cena CROME, RN Outcome: Adequate for Discharge 07/03/2024 1435 by Lynnette Cena CROME, RN Outcome: Progressing Goal: Individualized Educational Video(s) 07/03/2024 1826 by Lynnette Cena CROME, RN Outcome: Adequate for Discharge 07/03/2024 1435 by Lynnette Cena CROME, RN Outcome: Progressing   Problem: Coping: Goal: Ability to adjust to condition or change in health will improve Outcome: Adequate for Discharge   Problem: Fluid Volume: Goal: Ability to maintain a balanced intake and output will improve Outcome: Adequate for Discharge   Problem: Health Behavior/Discharge Planning: Goal: Ability to identify and utilize available resources and services will improve Outcome: Adequate for Discharge Goal: Ability to manage health-related needs will improve Outcome: Adequate for Discharge   Problem: Metabolic: Goal: Ability to maintain appropriate glucose levels will improve Outcome: Adequate for Discharge   Problem: Nutritional: Goal: Maintenance of adequate nutrition will improve Outcome: Adequate for Discharge Goal: Progress toward achieving an optimal weight will improve Outcome: Adequate for Discharge   Problem: Skin Integrity: Goal: Risk for impaired skin integrity will decrease Outcome: Adequate for Discharge   Problem: Tissue Perfusion: Goal: Adequacy of tissue perfusion will improve Outcome: Adequate for Discharge   Problem: Education: Goal: Knowledge of General Education information will improve Description: Including pain rating scale, medication(s)/side effects and non-pharmacologic comfort measures Outcome: Adequate for Discharge   Problem: Health Behavior/Discharge Planning: Goal: Ability to manage health-related needs will improve Outcome: Adequate for Discharge   Problem: Clinical  Measurements: Goal: Ability to maintain clinical measurements within normal limits will improve Outcome: Adequate for Discharge Goal: Will remain free from infection Outcome: Adequate for Discharge Goal: Diagnostic test results will improve Outcome: Adequate for Discharge Goal: Respiratory complications will improve Outcome: Adequate for Discharge Goal: Cardiovascular complication will be avoided Outcome: Adequate for Discharge   Problem: Activity: Goal: Risk for activity intolerance will decrease Outcome: Adequate for Discharge   Problem: Nutrition: Goal: Adequate nutrition will be maintained Outcome: Adequate for Discharge   Problem: Coping: Goal: Level of anxiety will decrease Outcome: Adequate for Discharge   Problem: Elimination: Goal: Will not experience complications related to bowel motility Outcome: Adequate for Discharge Goal: Will not experience complications related to urinary retention Outcome: Adequate for Discharge   Problem: Pain Managment: Goal: General experience of comfort will improve and/or be controlled Outcome: Adequate for Discharge   Problem: Safety: Goal: Ability to remain free from injury will improve Outcome: Adequate for Discharge   Problem: Skin Integrity: Goal: Risk for impaired skin integrity will decrease Outcome: Adequate for Discharge

## 2024-07-03 NOTE — Anesthesia Postprocedure Evaluation (Signed)
 Anesthesia Post Note  Patient: Sopheap Wayne Arey  Procedure(s) Performed: AMPUTATION, TOE (Left: Toe)  Patient location during evaluation: PACU Anesthesia Type: General Level of consciousness: awake and alert Pain management: pain level controlled Vital Signs Assessment: post-procedure vital signs reviewed and stable Respiratory status: spontaneous breathing, nonlabored ventilation, respiratory function stable and patient connected to nasal cannula oxygen Cardiovascular status: blood pressure returned to baseline and stable Postop Assessment: no apparent nausea or vomiting Anesthetic complications: no   No notable events documented.   Last Vitals:  Vitals:   07/03/24 1535 07/03/24 1606  BP:  (!) 153/49  Pulse: (!) 58 (!) 59  Resp: 14 15  Temp: (!) 36.2 C (!) 36.4 C  SpO2: 100% 100%    Last Pain:  Vitals:   07/03/24 1535  TempSrc:   PainSc: 0-No pain                 Debby Mines

## 2024-07-03 NOTE — Plan of Care (Signed)
   Problem: Education: Goal: Ability to describe self-care measures that may prevent or decrease complications (Diabetes Survival Skills Education) will improve Outcome: Progressing Goal: Individualized Educational Video(s) Outcome: Progressing

## 2024-07-03 NOTE — Assessment & Plan Note (Signed)
 Continue statin.

## 2024-07-03 NOTE — Hospital Course (Signed)
 78 y.o. male with medical history significant for diabetes, HTN, chronic pain syndrome, GERD, HLD, CAD s/p CABG who came in to ED at [facility] from Dr. Honore office for left third toe infection for amputation.  Patient stated that he has this for at least a week or so.  The wound was a small and getting progressively worse and also has a pain and tenderness.  Denies any fever or chills.  Denies any trauma to the foot.  Patient is stated that his diabetes is well-controlled.  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  He stated that he went to Dr. Honore office who advised him to go to the emergency room and get admitted and they will take his toe off.   ED Course: Upon arrival to the ED, patient is found to necrotic third toe on the left side.  They started on Zosyn  and vancomycin  and hospitalist service was consulted for evaluation for admission.  7/17.  Patient had left fourth toe toe amputation by Dr. [name] today for osteomyelitis.  Dr. Lucyann believes he got all of the infection but recommended Augmentin  and doxycycline  for 1 week.  Patient not having any pain after surgery.  Postoperative boot ordered by Dr. [name] and patient will bear weight on heel only.  Patient wants to go home today.

## 2024-07-03 NOTE — Assessment & Plan Note (Signed)
 Continue Entresto  and Coreg .  Patient has hydralazine  as needed

## 2024-07-03 NOTE — Progress Notes (Signed)
 Surgical Dressing on left foot is xeroform, 4x4 gauze, kerlix and ace wrap.  CDI

## 2024-07-03 NOTE — Consult Note (Signed)
 Pharmacy Antibiotic Note  ASSESSMENT: 78 y.o. male with PMH including T2DM, left toe osteomyelitis, CKD, HTN, HFrEF, CAD (CABGx3, NSTEMI)  is presenting with wound infection. Patient has open ulceration to the left 4th toe with bone exposed. Patient remains afebrile with WBC 5.9. Pharmacy has been consulted to manage Zosyn  and vancomycin  dosing.   Podiatry to amputate toe on 7/17.   PLAN: Scr 1.46 > 1.12 Continue Zosyn  3.375g IV q8H Adjust vancomycin  to 750 mg IV every 12 hours based on improved renal function eAUC 453.3, Scr 1.12, IBW, Vd 0.72 L/kg Follow up culture results to assess for antibiotic optimization. Monitor renal function to assess for any necessary antibiotic dosing changes.  Patient measurements: Height: 5' 11 (180.3 cm) Weight: 83.9 kg (185 lb) IBW/kg (Calculated) : 75.3  Vital signs: Temp: 98.2 F (36.8 C) (07/17 0731) BP: 131/66 (07/17 0731) Pulse Rate: 63 (07/17 0731) Recent Labs  Lab 07/02/24 1048 07/03/24 0546  WBC 8.2 5.9  CREATININE 1.46* 1.12   Estimated Creatinine Clearance: 60.7 mL/min (by C-G formula based on SCr of 1.12 mg/dL).  Allergies: Allergies  Allergen Reactions   Blood-Group Specific Substance Other (See Comments)    Patient received almost 2 units FFP during a procedure. Became hypotensive and developed rash   Cortisone Acetate [Cortisone] Rash and Other (See Comments)    The patient feels that he can take this, he not sure why this was put on his file    Antimicrobials this admission: Zosyn  7/16 >> Vancomycin  7/16 >>  Dose adjustments this admission: 7/17: Vancomycin  adjusted from 1250 mg IV every 24 hours to 750 mg IV ever 12 hours  Microbiology results: N/A   Thank you for involving pharmacy in this patient's care.   Damien Napoleon, PharmD Clinical Pharmacist 07/03/2024 9:23 AM

## 2024-07-03 NOTE — Assessment & Plan Note (Addendum)
 Last EF around 40% continue Coreg , Farxiga  and Entresto 

## 2024-07-03 NOTE — Assessment & Plan Note (Signed)
 Hemoglobin 10.0.  Follow-up as outpatient.

## 2024-07-03 NOTE — TOC Initial Note (Signed)
 Transition of Care [facility]) - Initial/Assessment Note    Patient Details  Name: Kenneth Duarte MRN: [id number] Date of Birth: 02-Aug-1946  [facility]) CM/SW Contact:    Elouise LULLA Capri, RN 07/03/2024, 5:07 PM  Clinical Narrative:                  CM to patient's room regarding case management screening assessment. Patient is amenable to case management interview. Patient lives with patients wife. Per patient no DME and is independent in ADLs.   Private transportation arranged.   Expected Discharge Plan: Home/Self Care Barriers to Discharge: No Barriers Identified   Patient Goals and CMS Choice    Home/self care   Expected Discharge Plan and Services    Home/self care   Living arrangements for the past 2 months: Single Family Home (1 step to enter residence) Expected Discharge Date: 07/03/24                   Prior Living Arrangements/Services Living arrangements for the past 2 months: Single Family Home (1 step to enter residence) Lives with:: Spouse Patient language and need for interpreter reviewed:: No Do you feel safe going back to the place where you live?: Yes      Need for Family Participation in Patient Care: Yes (Comment) Care giver support system in place?: Yes (comment)   Criminal Activity/Legal Involvement Pertinent to Current Situation/Hospitalization: No - Comment as needed  Activities of Daily Living    Per patient is independent in ADLs  Permission Sought/Granted Permission sought to share information with : Case Manager, Family Supports Permission granted to share information with : Yes, Verbal Permission Granted  Share Information with NAME: Dagoberto Rouleau, wife/Linda Koleen     Permission granted to share info w Relationship: Wife/Sister     Emotional Assessment Appearance:: Appears stated age Attitude/Demeanor/Rapport: Engaged Affect (typically observed): Calm Orientation: : Oriented to Self, Oriented to Place, Oriented to  Time,  Oriented to Situation      Admission diagnosis:  Diabetic foot infection (HCC) [Z88.371, L08.9] Type 2 diabetes mellitus without complication, without long-term current use of insulin  (HCC) [E11.9] 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 (HCC) [M86.9] Patient Active Problem List   Diagnosis Date Noted   Chronic systolic CHF (congestive heart failure) (HCC) 07/03/2024   HFrEF (heart failure with reduced ejection fraction) (HCC) 08/31/2023   Acute osteomyelitis of metatarsal bone of right foot (HCC) 04/19/2023   Hypotension 04/18/2023   Atherosclerosis of native arteries of the extremities with ulceration (HCC) 03/20/2023   Cellulitis in diabetic foot (HCC) 03/20/2023   COPD (chronic obstructive pulmonary disease) (HCC) 03/20/2023   CHF (congestive heart failure) (HCC) 03/20/2023   AKI (acute kidney injury) (HCC) 03/20/2023   Esophagitis    Diabetic neuropathy (HCC) 12/22/2019   S/P amputation of lesser toe, left (HCC) 12/22/2019   IDA (iron  deficiency anemia) 10/21/2019   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 (HCC) 06/16/2019   Acute bronchitis with COPD (HCC) 03/07/2018   Acute systolic CHF (congestive heart failure) (HCC) 10/31/2017   Bilateral leg edema 10/22/2017   CAD (coronary artery disease) of bypass graft 06/20/2017   Anemia, unspecified 06/20/2017   Dyslipidemia 06/20/2017   NSTEMI (non-ST elevated myocardial infarction) (HCC) 06/18/2017   S/P CABG x 3 04/10/2017   Stable angina (HCC) 02/14/2017   GERD (gastroesophageal reflux disease) 08/22/2016   History of colonic polyps    Benign neoplasm of sigmoid colon    Essential hypertension 06/02/2015  Breathlessness on exertion 02/02/2015   Carotid artery plaque 10/26/2014   Atherosclerosis of both carotid arteries 10/26/2014   Arteriosclerosis of coronary artery 10/16/2014   Diabetes (HCC) 10/16/2014   Combined fat and carbohydrate induced hyperlipemia 10/16/2014   Type 2 diabetes mellitus (HCC) 10/16/2014   PCP:   [name], MD Pharmacy:   [facility] [address] (N),  - [address] [employer] [employer]) [place] [address] Phone: [phone] Fax: [phone]  [facility] - [place], [place] - [address] [place] [address] Phone: [phone] Fax: [phone]  [facility] - [place], [place] - [address] [place] [address] Phone: [phone] Fax: [phone]     Social Drivers of Health (SDOH) Social History: SDOH Screenings   Food Insecurity: No Food Insecurity (07/02/2024)  Housing: Low Risk  (07/02/2024)  Transportation Needs: No Transportation Needs (07/02/2024)  Utilities: Not At Risk (07/02/2024)  Financial Resource Strain: Low Risk  (01/10/2024)   Received from [facility]  Social Connections: Socially Integrated (07/02/2024)  Tobacco Use: Medium Risk (07/02/2024)   SDOH Interventions: Food Insecurity Interventions: Intervention Not Indicated Housing Interventions: Intervention Not Indicated Transportation Interventions: Intervention Not Indicated Utilities Interventions: Intervention Not Indicated Social Connections Interventions: Intervention Not Indicated   Readmission Risk Interventions     No data to display

## 2024-07-03 NOTE — Anesthesia Preprocedure Evaluation (Signed)
 Anesthesia Evaluation  Patient identified by MRN, date of birth, ID band Patient awake    Reviewed: Allergy & Precautions, NPO status , Patient's Chart, lab work & pertinent test results  History of Anesthesia Complications Negative for: history of anesthetic complications  Airway Mallampati: III  TM Distance: <3 FB Neck ROM: full    Dental  (+) Chipped, Poor Dentition, Missing   Pulmonary shortness of breath and with exertion, COPD, former smoker   Pulmonary exam normal        Cardiovascular Exercise Tolerance: Good hypertension, (-) angina + CAD, + Past MI, + Cardiac Stents, + CABG, + Peripheral Vascular Disease and +CHF  Normal cardiovascular exam     Neuro/Psych  Headaches  Neuromuscular disease  negative psych ROS   GI/Hepatic Neg liver ROS,GERD  Controlled,,  Endo/Other  diabetes, Type 2    Renal/GU Renal disease  negative genitourinary   Musculoskeletal   Abdominal   Peds  Hematology negative hematology ROS (+)   Anesthesia Other Findings Past Medical History: No date: Arthritis No date: Atherosclerosis of both carotid arteries No date: Benign neoplasm of sigmoid colon No date: Bilateral leg edema No date: Cardiomyopathy (HCC) No date: Cellulitis in diabetic foot (HCC) No date: Chronic pain syndrome 1980: Congestive heart failure (HCC) No date: COPD (chronic obstructive pulmonary disease) (HCC) No date: Coronary artery disease     Comment:  a. s/p 3V CABG (03/2017). b. NSTEMI 06/2017 with               progressive graft disease, s/p DES to SVG-OM. 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 04/17/2024: MSSA (methicillin susceptible Staphylococcus aureus) No date: Neuropathy of both feet No date: NSTEMI (non-ST elevated myocardial infarction) (HCC) No date: Osteomyelitis of foot (HCC) No date: S/P amputation of lesser toe, left (HCC) No date: S/P CABG x 3 No date: Seborrheic keratosis No date: Sinus congestion No date: Stable angina (HCC) No date: Vertigo No date: Wears dentures     Comment:  PARTIAL UPPER  Past Surgical History: 10/25/2015: COLONOSCOPY WITH PROPOFOL ; N/A     Comment:  Procedure: COLONOSCOPY WITH PROPOFOL ;  Surgeon: Marnee Sink, MD;  Location: [facility];  Service:               Endoscopy;  Laterality: N/A;  DIABETIC-ORAL MEDS 10/22/2020: COLONOSCOPY WITH PROPOFOL ; N/A     Comment:  Procedure: COLONOSCOPY CANCELED;  Surgeon: Marnee Sink,              MD;  Location: [facility];  Service: Endoscopy;               Laterality: N/A;  procedure aborted poor prep 10/25/2020: COLONOSCOPY WITH PROPOFOL ; N/A     Comment:  Procedure: COLONOSCOPY WITH PROPOFOL ;  Surgeon: Marnee Sink, MD;  Location: [facility];  Service:               Endoscopy;  Laterality: N/A; 04/10/2017: CORONARY ARTERY BYPASS GRAFT; N/A     Comment:  Procedure: CORONARY ARTERY BYPASS  GRAFTING (CABG) x               three , using left internal mammary artery and right leg               greater saphenous vein harvested endoscopically;                Surgeon: Heriberto London, MD;  Location: [facility] OR;  Service:              Open Heart Surgery;  Laterality: N/A; No date: CORONARY ARTERY BYPASS GRAFT 06/19/2017: CORONARY STENT INTERVENTION; N/A     Comment:  Procedure: Coronary Stent Intervention;  Surgeon:               Lucendia Rusk, MD;  Location: [facility];               Service: Cardiovascular;  Laterality: N/A; 10/22/2020: ESOPHAGOGASTRODUODENOSCOPY (EGD) WITH PROPOFOL ; N/A     Comment:  Procedure:  ESOPHAGOGASTRODUODENOSCOPY (EGD) WITH               PROPOFOL ;  Surgeon: Marnee Sink, MD;  Location: [facility];  Service: Endoscopy;  Laterality: N/A;                Diabetic - oral meds 03/22/2023: EXCISION PARTIAL PHALANX; Right     Comment:  Procedure: EXCISION  PHALANX;  Surgeon: Pink Bridges,               DPM;  Location: [facility] ORS;  Service: Podiatry;                Laterality: Right; 04/19/2023: INCISION AND DRAINAGE; Right     Comment:  Procedure: INCISION AND DRAINAGE;  Surgeon: Angel Barba,              DPM;  Location: [facility];  Service: Podiatry;                Laterality: Right; 03/22/2023: IRRIGATION AND DEBRIDEMENT FOOT; Right     Comment:  Procedure: IRRIGATION AND DEBRIDEMENT FOOT;  Surgeon:               Pink Bridges, DPM;  Location: [facility];  Service:               Podiatry;  Laterality: Right; No date: KNEE SURGERY; Right     Comment:  over 20 years ago 02/22/2017: LEFT HEART CATH AND CORONARY ANGIOGRAPHY; Left     Comment:  Procedure: Left Heart Cath and Coronary Angiography;                Surgeon: Michelle Aid, MD;  Location: [facility]               CV [facility];  Service: Cardiovascular;  Laterality: Left; 06/18/2017: LEFT HEART CATH AND CORS/GRAFTS ANGIOGRAPHY; N/A     Comment:  Procedure: Left Heart Cath and Cors/Grafts Angiography               and PCI;  Surgeon: Percival Brace, MD;  Location:               [facility];  Service: Cardiovascular;                Laterality: N/A; 03/23/2023: LOWER EXTREMITY ANGIOGRAPHY; Right     Comment:  Procedure: Lower Extremity Angiography;  Surgeon: Vonna Guardian,  Donald Frost, MD;  Location: [facility];  Service:               Cardiovascular;  Laterality: Right; 03/23/2023: LOWER EXTREMITY INTERVENTION     Comment:  Procedure: LOWER EXTREMITY INTERVENTION;  Surgeon: Celso College, MD;  Location: [facility];  Service:               Cardiovascular;; 10/25/2015:  POLYPECTOMY     Comment:  Procedure: POLYPECTOMY;  Surgeon: Marnee Sink, MD;                Location: [facility];  Service: Endoscopy;; 08/31/2023: RIGHT/LEFT HEART CATH AND CORONARY/GRAFT ANGIOGRAPHY;  Bilateral     Comment:  Procedure: RIGHT/LEFT HEART CATH AND CORONARY/GRAFT               ANGIOGRAPHY;  Surgeon: Wenona Hamilton, MD;  Location:               [facility];  Service: Cardiovascular;                Laterality: Bilateral; 2012: ROTATOR CUFF REPAIR; Right 04/10/2017: TEE WITHOUT CARDIOVERSION; N/A     Comment:  Procedure: TRANSESOPHAGEAL ECHOCARDIOGRAM (TEE);                Surgeon: Heriberto London, MD;  Location: [facility] OR;  Service:              Open Heart Surgery;  Laterality: N/A;  BMI    Body Mass Index: 25.07 kg/m      Reproductive/Obstetrics negative OB ROS                             Anesthesia Physical Anesthesia Plan  ASA: 3  Anesthesia Plan: General   Post-op Pain Management:    Induction: Intravenous  PONV Risk Score and Plan: Propofol  infusion and TIVA  Airway Management Planned: Natural Airway and Nasal Cannula  Additional Equipment:   Intra-op Plan:   Post-operative Plan:   Informed Consent: I have reviewed the patients History and Physical, chart, labs and discussed the procedure including the risks, benefits and alternatives for the proposed anesthesia with the patient or authorized representative who has indicated his/her understanding and acceptance.     Dental Advisory Given  Plan Discussed with: Anesthesiologist, CRNA and Surgeon  Anesthesia Plan Comments: (Patient consented for risks of anesthesia including but not limited to:  - adverse reactions to medications - risk of airway placement if required - damage to eyes, teeth, lips or other oral mucosa - nerve damage due to positioning  - sore throat or hoarseness - Damage to heart, brain, nerves, lungs, other parts of body or loss of  life  Patient voiced understanding and assent.)       Anesthesia Quick Evaluation

## 2024-07-03 NOTE — TOC Transition Note (Signed)
 Transition of Care [facility]) - Discharge Note   Patient Details  Name: Kenneth Duarte MRN: [id number] Date of Birth: 05-06-1946  [facility]) CM/SW Contact:  Elouise LULLA Capri, RN 07/03/2024, 5:09 PM   Clinical Narrative:     Discharge orders noted. Private transportation arranged. No identified needs.   Final next level of care: Home/Self Care Barriers to Discharge: No Barriers Identified   Patient Goals and CMS Choice    Home/self care   Discharge Placement     Home/self care     Discharge Plan and Services Additional resources added to the After Visit Summary for       Social Drivers of Health (SDOH) Interventions SDOH Screenings   Food Insecurity: No Food Insecurity (07/02/2024)  Housing: Low Risk  (07/02/2024)  Transportation Needs: No Transportation Needs (07/02/2024)  Utilities: Not At Risk (07/02/2024)  Financial Resource Strain: Low Risk  (01/10/2024)   Received from [facility]  Social Connections: Socially Integrated (07/02/2024)  Tobacco Use: Medium Risk (07/02/2024)     Readmission Risk Interventions     No data to display

## 2024-07-03 NOTE — Progress Notes (Signed)
 Discharge instructions, RX's and follow up appts explained and provided to patient verbalized understanding. Patient placed in post op shoe, left floor via wheelchair accompanied by staff. No c/o pain or shortness of breath.  Shaleka Brines, Cena Helling, RN

## 2024-07-03 NOTE — Op Note (Signed)
 PODIATRY / FOOT AND ANKLE SURGERY OPERATIVE REPORT    SURGEON: Prentice Lee, DPM  PRE-OPERATIVE DIAGNOSIS:  1.  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  PVD 3.  Diabetes type 2 polyneuropathy  POST-OPERATIVE DIAGNOSIS: Same  PROCEDURE(S): Left fourth toe amputation  HEMOSTASIS: Left ankle tourniquet  ANESTHESIA: MAC  ESTIMATED BLOOD LOSS: 5 cc  FINDING(S): 1.  Osteomyelitis distal phalanx left fourth toe with associated cellulitis to the metatarsal phalangeal joint  PATHOLOGY/SPECIMEN(S): Left fourth toe proximal margin purpling, left fourth toe bone culture  INDICATIONS:   Kenneth Duarte is a 78 y.o. male who presents with a new ulceration of the distal tip of left fourth toe.  He notes that it occurred over the weekend after doing yard work when he took his shoe off he noticed that he had some bleeding come from the area.  Since that time he noticed increased redness and swelling to the area.  He came to clinic on Wednesday for evaluation was noted to have a sausage type digit with increased erythema and edema present to the area extending to the ankle joint.  X-ray imaging showed osteomyelitis to the distal phalanx of the left fourth toe.  All treatment options were discussed with the patient both conservative and surgical attempts at correction including potential risks and complications at this time patient is elected for left fourth toe amputation.  No guarantees given.  Consent obtained prior to procedure..  DESCRIPTION: After obtaining full informed written consent, the patient was brought back to the operating room and placed supine upon the operating table.  The patient received IV antibiotics prior to induction.  After obtaining adequate anesthesia, the patient was prepped and draped in the standard fashion.  10 cc of half percent Marcaine  plain was injected about the left fourth ray.  An Esmarch bandage was used to  exsanguinate the left lower extremity and pneumatic ankle tourniquet was inflated.  Attention was directed to the left fourth toe slightly distal to the metatarsal phalangeal joint where a racquet type of incision was made starting over the fourth metatarsal phalangeal joint area extending around the proximal phalanx base.  The incision was made straight to bone.  An extensor tenotomy and capsulotomy was performed followed by release the collateral and suspensory ligaments as well as any connection to the plantar plate and flexor tendon.  The fourth toe was then disarticulated and passed off the operative site.  A bone culture was taken from the distal tip of the left fourth toe.  The toe was then passed off the operative site with the proximal margin marked in purple ink to pathology.  The surgical site was flushed with copious amounts normal sterile saline.  There did not appear to be any further infected tissue present at the fourth metatarsal phalange joint level.  Believe the amputation was curative of infection.  Deep structures and subcutaneous tissue was reapproximated well coapted with 3-0 Vicryl and the skin was then reapproximated coapted with 3-0 nylon in a combination of simple and horizontal mattress type stitching.  The pneumatic ankle tourniquet was deflated and a prompt hyperemic response was noted all digits left foot.  A postoperative dressing was applied consisting of Xeroform followed by 4 x 4 gauze, gauze roll, Ace wrap.  Patient tolerated the procedure and anesthesia well and was transferred to the recovery room with vital signs stable and vascular status intact to all toes of both feet.  Following a period of postoperative  monitoring the patient will be discharged back to the inpatient room with the appropriate orders, instructions, medications.  Podiatry team to sign off on patient at this time.  Believe patient can be discharged on broad-spectrum antibiotics.  Will follow-up on  culture results outpatient.  Recommend Augmentin  and doxycycline  for 7 days.  Patient to keep dressing clean, dry, and intact until seen in outpatient clinic in about a week.  Patient is to maintain partial weightbearing in a postoperative shoe trying to put most of his pressure on the heel and ball of the foot and avoiding direct pressure to the digits.  Postop shoe ordered.  COMPLICATIONS: None  CONDITION: Good, stable  Prentice Lee, DPM

## 2024-07-03 NOTE — H&P (Signed)
 HISTORY AND PHYSICAL INTERVAL NOTE:  07/03/2024  1:24 PM  Kenneth Duarte  has presented today for surgery, with the diagnosis of osteomyelitis.  The various methods of treatment have been discussed with the patient.  No guarantees were given.  After consideration of risks, benefits and other options for treatment, the patient has consented to surgery.  I have reviewed the patients' chart and labs.    PROCEDURE: Left 4th toe amputation  A history and physical examination was performed in the hospital.  The patient was reexamined.  There have been no changes to this history and physical examination.  Kenneth Duarte, DPM

## 2024-07-03 NOTE — Discharge Summary (Signed)
 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 *  Hospital Course:  78 y.o. male with medical history significant for diabetes, HTN, chronic pain syndrome, GERD, HLD, CAD s/p CABG who came in to ED at [facility] from Dr. Honore office for left third toe infection for amputation.  Patient stated that he has this for at least a week or so.  The wound was a small and getting progressively worse and also has a pain and tenderness.  Denies any fever or chills.  Denies any trauma to the foot.  Patient is stated that his diabetes is well-controlled.  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  He stated that he went to Dr. Honore office who advised him to go to the emergency room and get admitted and they will take his toe off.   ED Course: Upon arrival to the ED, patient is found to necrotic third toe on the left side.  They started on Zosyn  and vancomycin  and hospitalist service was consulted for evaluation for admission.  7/17.  Patient had left fourth toe toe amputation by Dr. [name] today for osteomyelitis.  Dr. Lucyann believes he got all of the infection but recommended Augmentin  and doxycycline  for 1 week.  Patient not having any pain after surgery.  Postoperative boot ordered by Dr. [name] and patient will bear weight on heel only.  Patient wants to go home today.  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  Switch IV antibiotics to oral antibiotics doxycycline  and Augmentin  upon discharge.  Dyslipidemia Continue statin  Anemia, unspecified Hemoglobin 10.0.  Follow-up as outpatient.  Type 2 diabetes mellitus (HCC) Can go back on metformin  as outpatient.  Continue Farxiga .  Essential hypertension Continue Entresto  and Coreg .  Patient has hydralazine  as needed         Consultants: Podiatry Procedures performed: Fourth toe amputation Disposition: Home Diet recommendation:  Cardiac and Carb modified diet DISCHARGE MEDICATION: Allergies as of 07/03/2024       Reactions   Blood-group Specific Substance Other (See Comments)   Patient received almost 2 units FFP during a procedure. Became hypotensive and developed rash   Cortisone Acetate [cortisone] Rash, Other (See Comments)   The patient feels that he can take this, he not sure why this was put on his file        Medication List     TAKE these medications    amoxicillin -clavulanate 875-125 MG tablet Commonly known as: AUGMENTIN  Take 1 tablet by mouth 2 (two) times daily for 7 days.   aspirin  EC 81 MG tablet Take 1 tablet (81 mg total) by mouth daily.   atorvastatin  80 MG tablet Commonly known as: LIPITOR  TAKE 1 TABLET AT BEDTIME   carvedilol  3.125 MG tablet Commonly known as:  COREG  Take 3.125 mg by mouth 2 (two) times daily.   clopidogrel  75 MG tablet Commonly known as: PLAVIX  TAKE 1 TABLET EVERY DAY   cyanocobalamin  1000 MCG tablet Commonly known as: VITAMIN B12 Take 1,000 mcg by mouth daily.   dapagliflozin  propanediol 10 MG Tabs tablet Commonly known as: Farxiga  Take 1 tablet (10 mg total) by mouth daily.   Denta 5000 Plus 1.1 % Crea dental cream Generic drug: sodium fluoride Place 1 Application onto teeth at bedtime.   doxycycline  100 MG  capsule Commonly known as: VIBRAMYCIN  Take 1 capsule (100 mg total) by mouth 2 (two) times daily for 7 days.   Entresto  24-26 MG Generic drug: sacubitril -valsartan  TAKE 1 TABLET TWICE DAILY   gabapentin  800 MG tablet Commonly known as: NEURONTIN  Take 1,600 mg by mouth 3 (three) times daily.   hydrALAZINE  25 MG tablet Commonly known as: APRESOLINE  Take 1 tablet (25 mg total) by mouth 3 (three) times daily as needed (For blood pressure greater then 160.).   meclizine  25 MG tablet Commonly known as: ANTIVERT  TAKE 1 TABLET TWICE DAILY AS NEEDED FOR DIZZINESS   metFORMIN  500 MG 24 hr tablet Commonly known as: GLUCOPHAGE -XR Take 500 mg by mouth daily.   nitroGLYCERIN  0.4 MG SL tablet Commonly known as: NITROSTAT  Place 1 tablet (0.4 mg total) under the tongue every 5 (five) minutes as needed for chest pain.   oxyCODONE -acetaminophen  10-325 MG tablet Commonly known as: PERCOCET Take 1 tablet by mouth See admin instructions. Every 3-4 hours   pantoprazole  40 MG tablet Commonly known as: PROTONIX  TAKE 1 TABLET EVERY DAY   Vitamin D -1000 Max St 25 MCG (1000 UT) tablet Generic drug: Cholecalciferol  Take 1,000 Units by mouth daily.        Follow-up Information     [name], DPM. Schedule an appointment as soon as possible for a visit in 1 week(s).   Specialty: Podiatry Why: For wound re-check Contact information: [address] [place] [address] [phone]         [name], MD Follow up in 5 day(s).   Specialty: Family Medicine Contact information: [address] [place] [address] [phone]                Discharge Exam: Filed Weights   07/02/24 1044  Weight: 83.9 kg   Physical Exam HENT:     Head: Normocephalic.  Eyes:     General: Lids are normal.  Cardiovascular:     Rate and Rhythm: Normal rate and regular rhythm.     Heart sounds: Normal heart sounds, S1 normal and S2 normal.  Pulmonary:     Breath  sounds: No decreased breath sounds, wheezing, rhonchi or rales.  Abdominal:     Palpations: Abdomen is soft.     Tenderness: There is no abdominal tenderness.  Musculoskeletal:     Right lower leg: No swelling.     Left lower leg: No swelling.  Skin:    General: Skin is warm.     Findings: No rash.  Neurological:     Mental Status: He is alert and oriented to person, place, and time.      Condition at discharge: stable  The results of significant diagnostics from this hospitalization (including imaging, microbiology, ancillary and laboratory) are listed below for reference.   Imaging Studies: DG Foot 2 Views Left Result Date: 07/02/2024 CLINICAL DATA:  Cellulitis and abscess of toe EXAM: LEFT FOOT - 2 VIEW COMPARISON:  None Available.  FINDINGS: Postoperative findings in the great toe proximal phalanx and metatarsal. Amputation of the second toe at the proximal metaphysis proximal phalanx level. Amputation of the third toe at the distal metaphysis proximal phalanx level. Widened distal interphalangeal joint of the fourth toe, conceivably postoperative or due to joint effusion. Subtle demineralization of the lateral tuft of the distal phalanx fourth toe with overlying soft tissue irregularity and possibly ulceration. These could be signs infection and osteomyelitis of the fourth toe. Substantial arthropathy of the first MTP joint particularly consisting of spurring. Mild dorsal subcutaneous edema along the distal forefoot is nonspecific. Arterial atheromatous vascular calcifications. IMPRESSION: 1. Subtle demineralization of the lateral tuft of the distal phalanx fourth toe with overlying soft tissue irregularity and possibly ulceration. These could be signs of infection and osteomyelitis of the fourth toe. 2. Widened distal interphalangeal joint of the fourth toe, conceivably postoperative or due to joint effusion. 3. Substantial arthropathy of the first MTP joint. 4. Arterial atheromatous  vascular calcifications. Electronically Signed   By: Ryan Salvage M.D.   On: 07/02/2024 15:02    Microbiology: Results for orders placed or performed during the hospital encounter of 04/18/24  Aerobic/Anaerobic Culture w Gram Stain (surgical/deep wound)     Status: None   Collection Time: 04/18/24 12:27 PM   Specimen: Foot, Left; Tissue  Result Value Ref Range Status   Specimen Description   Final    TISSUE Performed at [facility], [address], [place], [place] [address]    Special Requests   Final    LEFT THIRS TOE BONE CULTURE Performed at [facility], [address] [place], [place] [address]    Gram Stain NO WBC SEEN NO ORGANISMS SEEN   Final   Culture   Final    No growth aerobically or anaerobically. Performed at [facility], [address], [place], [place] [address]    Report Status 04/23/2024 FINAL  Final    Labs: CBC: Recent Labs  Lab 07/02/24 1048 07/03/24 0546  WBC 8.2 5.9  NEUTROABS 6.1  --   HGB 11.2* 10.0*  HCT 34.7* 31.0*  MCV 89.0 87.3  PLT 228 188   Basic Metabolic Panel: Recent Labs  Lab 07/02/24 1048 07/03/24 0546  NA 139 139  K 4.1 3.8  CL 105 109  CO2 25 23  GLUCOSE 158* 121*  BUN 16 12  CREATININE 1.46* 1.12  CALCIUM  8.5* 8.2*   Liver Function Tests: No results for input(s): AST, ALT, ALKPHOS, BILITOT, PROT, ALBUMIN  in the last 168 hours. CBG: Recent Labs  Lab 07/03/24 0822 07/03/24 1144 07/03/24 1326 07/03/24 1508 07/03/24 1629  GLUCAP 127* 101* 98 86 108*    Discharge time spent: greater than 30 minutes.  Signed: [name], MD [facility] 07/03/2024

## 2024-07-04 ENCOUNTER — Encounter: Payer: Self-pay | Admitting: Podiatry

## 2024-07-07 LAB — SURGICAL PATHOLOGY

## 2024-07-08 LAB — AEROBIC/ANAEROBIC CULTURE W GRAM STAIN (SURGICAL/DEEP WOUND)

## 2024-07-25 ENCOUNTER — Encounter: Payer: Self-pay | Admitting: Oncology

## 2024-08-01 ENCOUNTER — Encounter (INDEPENDENT_AMBULATORY_CARE_PROVIDER_SITE_OTHER): Payer: Self-pay | Admitting: Vascular Surgery

## 2024-08-01 ENCOUNTER — Ambulatory Visit (INDEPENDENT_AMBULATORY_CARE_PROVIDER_SITE_OTHER)

## 2024-08-01 ENCOUNTER — Ambulatory Visit (INDEPENDENT_AMBULATORY_CARE_PROVIDER_SITE_OTHER): Admitting: Vascular Surgery

## 2024-08-01 VITALS — BP 180/69 | HR 62 | Resp 18 | Wt 181.4 lb

## 2024-08-01 DIAGNOSIS — I7025 Atherosclerosis of native arteries of other extremities with ulceration: Secondary | ICD-10-CM

## 2024-08-01 DIAGNOSIS — I10 Essential (primary) hypertension: Secondary | ICD-10-CM | POA: Diagnosis not present

## 2024-08-01 DIAGNOSIS — L97509 Non-pressure chronic ulcer of other part of unspecified foot with unspecified severity: Secondary | ICD-10-CM

## 2024-08-01 DIAGNOSIS — E785 Hyperlipidemia, unspecified: Secondary | ICD-10-CM

## 2024-08-01 DIAGNOSIS — E11621 Type 2 diabetes mellitus with foot ulcer: Secondary | ICD-10-CM | POA: Diagnosis not present

## 2024-08-01 LAB — VAS US ABI WITH/WO TBI
Left ABI: >1
Right ABI: >1

## 2024-08-01 NOTE — Progress Notes (Signed)
 MRN : [id number]  Kenneth Duarte is a 78 y.o. ([birth year]) male who presents with chief complaint of  Chief Complaint  Patient presents with   Follow-up    3 month abi follow up  .  History of Present Illness: Patient returns today in follow up of PAD with ulceration.  He underwent right lower extremity revascularization earlier this year for nonhealing ulceration and infection of the right foot.  With the help of the podiatrist, his wounds have now healed.  He is not have any current rest pain or claudication symptoms. ABIs today remain noncompressible but waveforms are strong and digital pressures are normal bilaterally.   Current Outpatient Medications  Medication Sig Dispense Refill   aspirin  81 MG tablet Take 1 tablet (81 mg total) by mouth daily. 90 tablet 1   atorvastatin  (LIPITOR ) 80 MG tablet TAKE 1 TABLET AT BEDTIME 90 tablet 3   carvedilol  (COREG ) 3.125 MG tablet Take 3.125 mg by mouth 2 (two) times daily.     clopidogrel  (PLAVIX ) 75 MG tablet TAKE 1 TABLET EVERY DAY 90 tablet 3   dapagliflozin  propanediol (FARXIGA ) 10 MG TABS tablet Take 1 tablet (10 mg total) by mouth daily. 90 tablet 3   DENTA 5000 PLUS 1.1 % CREA dental cream Place 1 Application onto teeth at bedtime.     ENTRESTO  24-26 MG TAKE 1 TABLET TWICE DAILY 180 tablet 3   gabapentin  (NEURONTIN ) 800 MG tablet Take 1,600 mg by mouth 3 (three) times daily.     hydrALAZINE  (APRESOLINE ) 25 MG tablet Take 1 tablet (25 mg total) by mouth 3 (three) times daily as needed (For blood pressure greater then 160.).     meclizine  (ANTIVERT ) 25 MG tablet TAKE 1 TABLET TWICE DAILY AS NEEDED FOR DIZZINESS 180 tablet 3   metFORMIN  (GLUCOPHAGE -XR) 500 MG 24 hr tablet Take 500 mg by mouth daily.     nitroGLYCERIN  (NITROSTAT ) 0.4 MG SL tablet Place 1 tablet (0.4 mg total) under the tongue every 5 (five) minutes as needed for chest pain. 25 tablet 3   oxyCODONE -acetaminophen  (PERCOCET) 10-325 MG tablet Take 1 tablet by mouth See admin  instructions. Every 3-4 hours     pantoprazole  (PROTONIX ) 40 MG tablet TAKE 1 TABLET EVERY DAY 90 tablet 3   vitamin B-12 (CYANOCOBALAMIN ) 1000 MCG tablet Take 1,000 mcg by mouth daily.     VITAMIN D -1000 MAX ST 25 MCG (1000 UT) tablet Take 1,000 Units by mouth daily.     No current facility-administered medications for this visit.    Past Medical History:  Diagnosis Date   Arthritis    Atherosclerosis of both carotid arteries    Benign neoplasm of sigmoid colon    Bilateral leg edema    Cardiomyopathy (HCC)    Cellulitis in diabetic foot (HCC)    Chronic pain syndrome    Congestive heart failure (HCC) 1980   COPD (chronic obstructive pulmonary disease) (HCC)    Coronary artery disease    a. s/p 3V CABG (03/2017). b. NSTEMI 06/2017 with progressive graft disease, s/p DES to SVG-OM.   DM (diabetes mellitus), type 2 (HCC)    Dyspnea    with exertion   ED (erectile dysfunction)    GERD (gastroesophageal reflux disease)    Gout    Headache    Hyperlipemia    Hypertension    CONTROLLED ON MEDS   IDA (iron  deficiency anemia) 10/21/2019   MRSA nasal colonization 2018   MSSA (methicillin susceptible Staphylococcus aureus) 04/17/2024  Neuropathy of both feet    NSTEMI (non-ST elevated myocardial infarction) (HCC)    Osteomyelitis of foot (HCC)    S/P amputation of lesser toe, left (HCC)    S/P CABG x 3    Seborrheic keratosis    Sinus congestion    Stable angina (HCC)    Vertigo    Wears dentures    PARTIAL UPPER    Past Surgical History:  Procedure Laterality Date   AMPUTATION TOE Left 04/18/2024   Procedure: AMPUTATION, TOE, THIRD;  Surgeon: [name], DPM;  Location: [facility];  Service: Orthopedics/Podiatry;  Laterality: Left;   AMPUTATION TOE Left 07/03/2024   Procedure: AMPUTATION, TOE;  Surgeon: [name], DPM;  Location: [facility];  Service: Orthopedics/Podiatry;  Laterality: Left;  Fourth toe   COLONOSCOPY WITH PROPOFOL  N/A 10/25/2015   Procedure: COLONOSCOPY  WITH PROPOFOL ;  Surgeon: Rogelia Copping, MD;  Location: [facility];  Service: Endoscopy;  Laterality: N/A;  DIABETIC-ORAL MEDS   COLONOSCOPY WITH PROPOFOL  N/A 10/22/2020   Procedure: COLONOSCOPY CANCELED;  Surgeon: Copping Rogelia, MD;  Location: [facility];  Service: Endoscopy;  Laterality: N/A;  procedure aborted poor prep   COLONOSCOPY WITH PROPOFOL  N/A 10/25/2020   Procedure: COLONOSCOPY WITH PROPOFOL ;  Surgeon: Copping Rogelia, MD;  Location: [facility];  Service: Endoscopy;  Laterality: N/A;   CORONARY ARTERY BYPASS GRAFT N/A 04/10/2017   Procedure: CORONARY ARTERY BYPASS GRAFTING (CABG) x three , using left internal mammary artery and right leg greater saphenous vein harvested endoscopically;  Surgeon: Maude Fleeta Ochoa, MD;  Location: [facility] OR;  Service: Open Heart Surgery;  Laterality: N/A;   CORONARY ARTERY BYPASS GRAFT     CORONARY STENT INTERVENTION N/A 06/19/2017   Procedure: Coronary Stent Intervention;  Surgeon: Dann Candyce RAMAN, MD;  Location: [facility];  Service: Cardiovascular;  Laterality: N/A;   ESOPHAGOGASTRODUODENOSCOPY (EGD) WITH PROPOFOL  N/A 10/22/2020   Procedure: ESOPHAGOGASTRODUODENOSCOPY (EGD) WITH PROPOFOL ;  Surgeon: Copping Rogelia, MD;  Location: [facility];  Service: Endoscopy;  Laterality: N/A;  Diabetic - oral meds   EXCISION PARTIAL PHALANX Right 03/22/2023   Procedure: EXCISION  PHALANX;  Surgeon: [name], DPM;  Location: [facility];  Service: Podiatry;  Laterality: Right;   INCISION AND DRAINAGE Right 04/19/2023   Procedure: INCISION AND DRAINAGE;  Surgeon: Neill Boas, DPM;  Location: [facility];  Service: Podiatry;  Laterality: Right;   IRRIGATION AND DEBRIDEMENT FOOT Right 03/22/2023   Procedure: IRRIGATION AND DEBRIDEMENT FOOT;  Surgeon: [name], DPM;  Location: [facility];  Service: Podiatry;  Laterality: Right;   KNEE SURGERY Right    over 20 years ago   LEFT HEART CATH AND CORONARY ANGIOGRAPHY Left 02/22/2017   Procedure: Left  Heart Cath and Coronary Angiography;  Surgeon: Wolm JINNY Rhyme, MD;  Location: [facility];  Service: Cardiovascular;  Laterality: Left;   LEFT HEART CATH AND CORS/GRAFTS ANGIOGRAPHY N/A 06/18/2017   Procedure: Left Heart Cath and Cors/Grafts Angiography and PCI;  Surgeon: Ammon Blunt, MD;  Location: [facility];  Service: Cardiovascular;  Laterality: N/A;   LOWER EXTREMITY ANGIOGRAPHY Right 03/23/2023   Procedure: Lower Extremity Angiography;  Surgeon: Marea Selinda RAMAN, MD;  Location: [facility];  Service: Cardiovascular;  Laterality: Right;   LOWER EXTREMITY INTERVENTION  03/23/2023   Procedure: LOWER EXTREMITY INTERVENTION;  Surgeon: Marea Selinda RAMAN, MD;  Location: [facility];  Service: Cardiovascular;;   POLYPECTOMY  10/25/2015   Procedure: POLYPECTOMY;  Surgeon: Rogelia Copping, MD;  Location: [facility];  Service: Endoscopy;;   RIGHT/LEFT HEART CATH AND CORONARY/GRAFT ANGIOGRAPHY Bilateral 08/31/2023   Procedure: RIGHT/LEFT HEART CATH AND CORONARY/GRAFT ANGIOGRAPHY;  Surgeon: Darron Deatrice LABOR, MD;  Location: [facility];  Service: Cardiovascular;  Laterality: Bilateral;   ROTATOR CUFF REPAIR Right 2012   TEE WITHOUT CARDIOVERSION N/A 04/10/2017   Procedure: TRANSESOPHAGEAL ECHOCARDIOGRAM (TEE);  Surgeon: Maude Fleeta Ochoa, MD;  Location: [facility] OR;  Service: Open Heart Surgery;  Laterality: N/A;     Social History   Tobacco Use   Smoking status: Former    Current packs/day: 0.00    Average packs/day: 2.0 packs/day for 15.0 years (30.0 ttl pk-yrs)    Types: Cigarettes    Start date: 06/29/1964    Quit date: 06/30/1979    Years since quitting: 45.1   Smokeless tobacco: Never  Vaping Use   Vaping status: Never Used  Substance Use Topics   Alcohol use: No    Alcohol/week: 0.0 standard drinks of alcohol    Comment: QUIT IN 1980   Drug use: No      Family History  Problem Relation Age of Onset   Cancer Mother    Cancer Father    Heart  disease Father    Heart attack Father    Diabetes Sister    Heart disease Brother    Diabetes Brother    Prostate cancer Brother    Bladder Cancer Neg Hx    Kidney disease Neg Hx      Allergies  Allergen Reactions   Blood-Group Specific Substance Other (See Comments)    Patient received almost 2 units FFP during a procedure. Became hypotensive and developed rash   Cortisone Acetate [Cortisone] Rash and Other (See Comments)    The patient feels that he can take this, he not sure why this was put on his file     REVIEW OF SYSTEMS (Negative unless checked)   Constitutional: [] Weight loss  [] Fever  [] Chills Cardiac: [] Chest pain   [] Chest pressure   [] Palpitations   [] Shortness of breath when laying flat   [] Shortness of breath at rest   [x] Shortness of breath with exertion. Vascular:  [] Pain in legs with walking   [] Pain in legs at rest   [] Pain in legs when laying flat   [] Claudication   [] Pain in feet when walking  [] Pain in feet at rest  [] Pain in feet when laying flat   [] History of DVT   [] Phlebitis   [] Swelling in legs   [] Varicose veins   [] Non-healing ulcers Pulmonary:   [] Uses home oxygen   [] Productive cough   [] Hemoptysis   [] Wheeze  [x] COPD   [] Asthma Neurologic:  [] Dizziness  [] Blackouts   [] Seizures   [] History of stroke   [] History of TIA  [] Aphasia   [] Temporary blindness   [] Dysphagia   [] Weakness or numbness in arms   [] Weakness or numbness in legs Musculoskeletal:  [x] Arthritis   [] Joint swelling   [] Joint pain   [] Low back pain Hematologic:  [] Easy bruising  [] Easy bleeding   [] Hypercoagulable state   [] Anemic   Gastrointestinal:  [] Blood in stool   [] Vomiting blood  [x] Gastroesophageal reflux/heartburn   [] Abdominal pain Genitourinary:  [] Chronic kidney disease   [] Difficult urination  [] Frequent urination  [] Burning with urination   [] Hematuria Skin:  [] Rashes   [x] Ulcers   [x] Wounds Psychological:  [] History of anxiety   []  History of major depression.  Physical  Examination  BP (!) 180/69   Pulse 62  Resp 18   Wt 181 lb 6.4 oz (82.3 kg)   BMI 25.30 kg/m  Gen:  WD/WN, NAD Head: [place]/AT, No temporalis wasting. Ear/Nose/Throat: Hearing grossly intact, nares w/o erythema or drainage Eyes: Conjunctiva clear. Sclera non-icteric Neck: Supple.  Trachea midline Pulmonary:  Good air movement, no use of accessory muscles.  Cardiac: RRR, no JVD Vascular:  Vessel Right Left  Radial Palpable Palpable                          PT Palpable Palpable  DP Palpable Palpable    Musculoskeletal: M/S 5/5 throughout.  No deformity or atrophy. No edema. Neurologic: Sensation grossly intact in extremities.  Symmetrical.  Speech is fluent.  Psychiatric: Judgment intact, Mood & affect appropriate for pt's clinical situation. Dermatologic: No rashes or ulcers noted.  No cellulitis or open wounds.      Labs Recent Results (from the past 2160 hours)  CBC with Differential     Status: Abnormal   Collection Time: 07/02/24 10:48 AM  Result Value Ref Range   WBC 8.2 4.0 - 10.5 K/uL   RBC 3.90 (L) 4.22 - 5.81 MIL/uL   Hemoglobin 11.2 (L) 13.0 - 17.0 g/dL   HCT 65.2 (L) 60.9 - 47.9 %   MCV 89.0 80.0 - 100.0 fL   MCH 28.7 26.0 - 34.0 pg   MCHC 32.3 30.0 - 36.0 g/dL   RDW 85.1 88.4 - 84.4 %   Platelets 228 150 - 400 K/uL   nRBC 0.0 0.0 - 0.2 %   Neutrophils Relative % 75 %   Neutro Abs 6.1 1.7 - 7.7 K/uL   Lymphocytes Relative 16 %   Lymphs Abs 1.3 0.7 - 4.0 K/uL   Monocytes Relative 8 %   Monocytes Absolute 0.7 0.1 - 1.0 K/uL   Eosinophils Relative 0 %   Eosinophils Absolute 0.0 0.0 - 0.5 K/uL   Basophils Relative 1 %   Basophils Absolute 0.0 0.0 - 0.1 K/uL   Immature Granulocytes 0 %   Abs Immature Granulocytes 0.02 0.00 - 0.07 K/uL    Comment: Performed at [facility], [address], [place], [place] [address]  Basic metabolic panel     Status: Abnormal   Collection Time: 07/02/24 10:48 AM  Result Value Ref Range   Sodium 139 135 -  145 mmol/L   Potassium 4.1 3.5 - 5.1 mmol/L   Chloride 105 98 - 111 mmol/L   CO2 25 22 - 32 mmol/L   Glucose, Bld 158 (H) 70 - 99 mg/dL    Comment: Glucose reference range applies only to samples taken after fasting for at least 8 hours.   BUN 16 8 - 23 mg/dL   Creatinine, Ser 8.53 (H) 0.61 - 1.24 mg/dL   Calcium  8.5 (L) 8.9 - 10.3 mg/dL   GFR, Estimated 50 (L) >60 mL/min    Comment: (NOTE) Calculated using the CKD-EPI Creatinine Equation (2021)    Anion gap 9 5 - 15    Comment: Performed at [facility], [address] [place], [place] [address]  Glucose, capillary     Status: Abnormal   Collection Time: 07/02/24  5:14 PM  Result Value Ref Range   Glucose-Capillary 107 (H) 70 - 99 mg/dL    Comment: Glucose reference range applies only to samples taken after fasting for at least 8 hours.  Glucose, capillary     Status: Abnormal   Collection Time: 07/02/24  9:00 PM  Result Value Ref Range   Glucose-Capillary 197 (H) 70 - 99 mg/dL    Comment: Glucose reference range applies only to samples taken after fasting for at least 8 hours.   Comment 1 Notify RN   Glucose, capillary     Status: Abnormal   Collection Time: 07/02/24 10:45 PM  Result Value Ref Range   Glucose-Capillary 139 (H) 70 - 99 mg/dL    Comment: Glucose reference range applies only to samples taken after fasting for at least 8 hours.  Surgical pathology     Status: None   Collection Time: 07/03/24 12:00 AM  Result Value Ref Range   SURGICAL PATHOLOGY      SURGICAL PATHOLOGY [facility] [address], [address] [place], [place] [address] Telephone [phone] or [phone] Fax [phone]  REPORT OF SURGICAL PATHOLOGY   Accession #: [id number] Patient Name: Kenneth Duarte, Kenneth Duarte Visit # : [id number]  MRN: [id number] Physician: [name] DOB/Age [birth year] (Age: 15) Gender: M Collected Date: 07/03/2024 Received Date: 07/04/2024  FINAL DIAGNOSIS       1. Toe(s), amputation,  left fourth toe :       ACUTE AND CHRONIC OSTEOMYELITIS.      SUBCUTANEOUS ACUTE INFLAMMATION AND ABSCESS FORMATION.      SKIN WITH ULCERATION AND HYPERPARAKERATOSIS.      BONE MARGIN IS VIABLE WITHOUT NECROSIS OR ACUTE OSTEOMYELITIS.       DATE SIGNED OUT: 07/07/2024 ELECTRONIC SIGNATURE : Pepper Dutton Md, Pathologist, Electronic Signature  MICROSCOPIC DESCRIPTION  CASE COMMENTS STAINS USED IN DIAGNOSIS: H&E H&E H&E    CLINICAL HISTORY  SPECIMEN(S) OBTAINED 1. Toe(s), amputation, Left Fourth Toe  SPECIMEN  COMMENTS: 1. Proximal margin purple ink SPECIMEN CLINICAL INFORMATION: 1. Osteomyelitis    Gross Description 1. Specimen: Left 4th toe      Size: 4.7 cm long (including a 2.7 cm long portion of exposed bone); up to 2.0      cm diameter      Margin: Grossly viable; received inked, over inked black at gross; white-tan,      firm, smooth disarticulated bone without softening; tan-gray, soft tissue.      Lesion: 1.5 x 1.0 cm; medial tip, 1.9 cm to the closest soft tissue margin;      well-circumscribed, ulcerated lesion exposing the mildly hemorrhagic, mildly      softened underlying bone.      Other findings: The nail is absent. The skin is tan-white and mildly keratotic.      Block summary:      1A: Bone margin (perpendicular); following decalcification      1B: Lesion (representative) including soft tissue margin (perpendicular)      1C: Bone underlying lesion; following decalcification      AMG 07/04/2024        Report signed out from the following locat ion(s) [facility] [address] [place], [place] [address] CLIA #: [id number]  [facility] [address] [place], [place] [address] CLIA #: [id number]   Hemoglobin A1c     Status: Abnormal   Collection Time: 07/03/24  5:46 AM  Result Value Ref Range   Hgb A1c MFr Bld 6.0 (H) 4.8 - 5.6 %    Comment: (NOTE) Diagnosis of Diabetes The following HbA1c ranges  recommended by the American Diabetes Association (ADA) may be used as an aid in the diagnosis of diabetes mellitus.  Hemoglobin  Suggested A1C NGSP%              Diagnosis  <5.7                   Non Diabetic  5.7-6.4                Pre-Diabetic  >6.4                   Diabetic  <7.0                   Glycemic control for                       adults with diabetes.     Mean Plasma Glucose 125.5 mg/dL    Comment: Performed at [facility], [address], [place], [place] [address]  Basic metabolic panel     Status: Abnormal   Collection Time: 07/03/24  5:46 AM  Result Value Ref Range   Sodium 139 135 - 145 mmol/L   Potassium 3.8 3.5 - 5.1 mmol/L   Chloride 109 98 - 111 mmol/L   CO2 23 22 - 32 mmol/L   Glucose, Bld 121 (H) 70 - 99 mg/dL    Comment: Glucose reference range applies only to samples taken after fasting for at least 8 hours.   BUN 12 8 - 23 mg/dL   Creatinine, Ser 8.87 0.61 - 1.24 mg/dL   Calcium  8.2 (L) 8.9 - 10.3 mg/dL   GFR, Estimated >39 >39 mL/min    Comment: (NOTE) Calculated using the CKD-EPI Creatinine Equation (2021)    Anion gap 7 5 - 15    Comment: Performed at [facility], [address] [place], [place] [address]  CBC     Status: Abnormal   Collection Time: 07/03/24  5:46 AM  Result Value Ref Range   WBC 5.9 4.0 - 10.5 K/uL   RBC 3.55 (L) 4.22 - 5.81 MIL/uL   Hemoglobin 10.0 (L) 13.0 - 17.0 g/dL   HCT 68.9 (L) 60.9 - 47.9 %   MCV 87.3 80.0 - 100.0 fL   MCH 28.2 26.0 - 34.0 pg   MCHC 32.3 30.0 - 36.0 g/dL   RDW 85.2 88.4 - 84.4 %   Platelets 188 150 - 400 K/uL   nRBC 0.0 0.0 - 0.2 %    Comment: Performed at [facility], [address] [place], [place] [address]  Protime-INR     Status: Abnormal   Collection Time: 07/03/24  5:46 AM  Result Value Ref Range   Prothrombin Time 15.3 (H) 11.4 - 15.2 seconds   INR 1.1 0.8 - 1.2    Comment: (NOTE) INR goal varies based on device and disease  states. Performed at [facility], [address] [place], [place] [address]   Glucose, capillary     Status: Abnormal   Collection Time: 07/03/24  8:22 AM  Result Value Ref Range   Glucose-Capillary 127 (H) 70 - 99 mg/dL    Comment: Glucose reference range applies only to samples taken after fasting for at least 8 hours.  Glucose, capillary     Status: Abnormal   Collection Time: 07/03/24 11:44 AM  Result Value Ref Range   Glucose-Capillary 101 (H) 70 - 99 mg/dL    Comment: Glucose reference range applies only to samples taken after fasting for at least 8 hours.  Glucose, capillary     Status: None  Collection Time: 07/03/24  1:26 PM  Result Value Ref Range   Glucose-Capillary 98 70 - 99 mg/dL    Comment: Glucose reference range applies only to samples taken after fasting for at least 8 hours.  Aerobic/Anaerobic Culture w Gram Stain (surgical/deep wound)     Status: None   Collection Time: 07/03/24  2:35 PM   Specimen: Bone; Tissue  Result Value Ref Range   Specimen Description      BONE Performed at [facility], [address], [place], [place] [address]    Special Requests       LEFT FOURTH TOE BONE CULTURE. Performed at [facility], [address] [place], [place] [address]    Gram Stain      FEW WBC PRESENT, PREDOMINANTLY PMN MODERATE GRAM POSITIVE COCCI    Culture      FEW STAPHYLOCOCCUS AUREUS RARE STREPTOCOCCUS MITIS/ORALIS FEW PROTEUS MIRABILIS NO ANAEROBES ISOLATED Performed at [facility], [address] [place], [place] [address]    Report Status 07/08/2024 FINAL    Organism ID, Bacteria STAPHYLOCOCCUS AUREUS    Organism ID, Bacteria STREPTOCOCCUS MITIS/ORALIS    Organism ID, Bacteria PROTEUS MIRABILIS       Susceptibility   Proteus mirabilis - MIC*    AMPICILLIN  <=2 SENSITIVE Sensitive     CEFEPIME <=0.12 SENSITIVE Sensitive     CEFTAZIDIME <=1 SENSITIVE Sensitive     CEFTRIAXONE  <=0.25 SENSITIVE Sensitive      CIPROFLOXACIN <=0.25 SENSITIVE Sensitive     GENTAMICIN <=1 SENSITIVE Sensitive     IMIPENEM 2 SENSITIVE Sensitive     TRIMETH/SULFA <=20 SENSITIVE Sensitive     AMPICILLIN /SULBACTAM <=2 SENSITIVE Sensitive     PIP/TAZO <=4 SENSITIVE Sensitive ug/mL    * FEW PROTEUS MIRABILIS   Staphylococcus aureus - MIC*    CIPROFLOXACIN <=0.5 SENSITIVE Sensitive     ERYTHROMYCIN <=0.25 SENSITIVE Sensitive     GENTAMICIN <=0.5 SENSITIVE Sensitive     OXACILLIN <=0.25 SENSITIVE Sensitive     TETRACYCLINE <=1 SENSITIVE Sensitive     VANCOMYCIN  <=0.5 SENSITIVE Sensitive     TRIMETH/SULFA <=10 SENSITIVE Sensitive     CLINDAMYCIN <=0.25 SENSITIVE Sensitive     RIFAMPIN <=0.5 SENSITIVE Sensitive     Inducible Clindamycin NEGATIVE Sensitive     LINEZOLID  2 SENSITIVE Sensitive     * FEW STAPHYLOCOCCUS AUREUS   Streptococcus mitis/oralis - MIC*    PENICILLIN <=0.06 SENSITIVE Sensitive     CEFTRIAXONE  <=0.12 SENSITIVE Sensitive     LEVOFLOXACIN 1 SENSITIVE Sensitive     VANCOMYCIN  0.5 SENSITIVE Sensitive     * RARE STREPTOCOCCUS MITIS/ORALIS  Glucose, capillary     Status: None   Collection Time: 07/03/24  3:08 PM  Result Value Ref Range   Glucose-Capillary 86 70 - 99 mg/dL    Comment: Glucose reference range applies only to samples taken after fasting for at least 8 hours.   Comment 1 Notify RN    Comment 2 Document in Chart   Glucose, capillary     Status: Abnormal   Collection Time: 07/03/24  4:29 PM  Result Value Ref Range   Glucose-Capillary 108 (H) 70 - 99 mg/dL    Comment: Glucose reference range applies only to samples taken after fasting for at least 8 hours.  VAS US  ABI WITH/WO TBI     Status: None   Collection Time: 08/01/24  7:51 AM  Result Value Ref Range   Right ABI >1.0 [place]    Left  ABI >1.0 [place]     Radiology VAS US  ABI WITH/WO TBI Result Date: 08/01/2024  LOWER EXTREMITY DOPPLER STUDY Patient Name:  Kenneth Duarte  Date of Exam:   08/01/2024 Medical Rec #: [id number]           Accession #:    [id number] Date of Birth: August 28, 1946          Patient Gender: M Patient Age:   84 years Exam Location:  [facility] Procedure:      VAS US  ABI WITH/WO TBI Referring Phys: Selinda Gu --------------------------------------------------------------------------------  Indications: Peripheral artery disease. High Risk Factors: Hypertension, hyperlipidemia, prior MI.  Vascular Interventions: 03/23/2023 Right ATA angioplasty and right SFA stent. Comparison Study: 04/14/2024 Performing Technologist: Leafy Gibes RVS  Examination Guidelines: A complete evaluation includes at minimum, Doppler waveform signals and systolic blood pressure reading at the level of bilateral brachial, anterior tibial, and posterior tibial arteries, when vessel segments are accessible. Bilateral testing is considered an integral part of a complete examination. Photoelectric Plethysmograph (PPG) waveforms and toe systolic pressure readings are included as required and additional duplex testing as needed. Limited examinations for reoccurring indications may be performed as noted.  ABI Findings: +---------+------------------+-----+---------+--------+ Right    Rt Pressure (mmHg)IndexWaveform Comment  +---------+------------------+-----+---------+--------+ Brachial 189                                      +---------+------------------+-----+---------+--------+ ATA      250               1.31 triphasicNC       +---------+------------------+-----+---------+--------+ PTA      209               1.09 triphasic         +---------+------------------+-----+---------+--------+ Great Toe180               0.94 Normal            +---------+------------------+-----+---------+--------+ +---------+------------------+-----+---------+-------+ Left     Lt Pressure (mmHg)IndexWaveform Comment +---------+------------------+-----+---------+-------+ Brachial 191                                      +---------+------------------+-----+---------+-------+ ATA      191               1.00 triphasic        +---------+------------------+-----+---------+-------+ PTA      250               1.31 biphasic [place]      +---------+------------------+-----+---------+-------+ Great Toe165               0.86 Normal           +---------+------------------+-----+---------+-------+ +-------+-----------+-----------+------------+------------+ ABI/TBIToday's ABIToday's TBIPrevious ABIPrevious TBI +-------+-----------+-----------+------------+------------+ Right  >1.0 [place]    .94        >1.0 [place]     .95          +-------+-----------+-----------+------------+------------+ Left   >1.0 [place]    .86        >1.0      .98          +-------+-----------+-----------+------------+------------+  Bilateral ABIs appear essentially unchanged compared to prior study on 04/14/2024. Right TBIs appear essentially unchanged compared to prior study on 04/14/2024. Left TBIs appear to be decreased compared to prior study on 04/14/2024.  Summary:  Right: Resting right ankle-brachial index indicates noncompressible right lower extremity arteries. The right toe-brachial index is normal. Left: Resting left ankle-brachial index indicates noncompressible left lower extremity arteries. The left toe-brachial index is normal. *See table(s) above for measurements and observations.  Electronically signed by Selinda Gu MD on 08/01/2024 at 8:46:23 AM.    Final    DG Foot 2 Views Left Result Date: 07/02/2024 CLINICAL DATA:  Cellulitis and abscess of toe EXAM: LEFT FOOT - 2 VIEW COMPARISON:  None Available. FINDINGS: Postoperative findings in the great toe proximal phalanx and metatarsal. Amputation of the second toe at the proximal metaphysis proximal phalanx level. Amputation of the third toe at the distal metaphysis proximal phalanx level. Widened distal interphalangeal joint of the fourth toe, conceivably postoperative or due to joint  effusion. Subtle demineralization of the lateral tuft of the distal phalanx fourth toe with overlying soft tissue irregularity and possibly ulceration. These could be signs infection and osteomyelitis of the fourth toe. Substantial arthropathy of the first MTP joint particularly consisting of spurring. Mild dorsal subcutaneous edema along the distal forefoot is nonspecific. Arterial atheromatous vascular calcifications. IMPRESSION: 1. Subtle demineralization of the lateral tuft of the distal phalanx fourth toe with overlying soft tissue irregularity and possibly ulceration. These could be signs of infection and osteomyelitis of the fourth toe. 2. Widened distal interphalangeal joint of the fourth toe, conceivably postoperative or due to joint effusion. 3. Substantial arthropathy of the first MTP joint. 4. Arterial atheromatous vascular calcifications. Electronically Signed   By: Ryan Salvage M.D.   On: 07/02/2024 15:02    Assessment/Plan  Dyslipidemia lipid control important in reducing the progression of atherosclerotic disease. Continue statin therapy   Atherosclerosis of native arteries of the extremities with ulceration (HCC) ABIs today remain noncompressible but waveforms are strong and digital pressures are normal bilaterally.  Wound has healed.  Continue current medical regimen.  We will now stretch out his follow-up to 6 months.  Benign essential HTN blood pressure control important in reducing the progression of atherosclerotic disease. On appropriate oral medications.     Diabetes mellitus (HCC) blood glucose control important in reducing the progression of atherosclerotic disease. Also, involved in wound healing. On appropriate medications.    Selinda Gu, MD  08/01/2024 9:05 AM    This note was created with Dragon medical transcription system.  Any errors from dictation are purely unintentional

## 2024-08-01 NOTE — Assessment & Plan Note (Signed)
 lipid control important in reducing the progression of atherosclerotic disease. Continue statin therapy

## 2024-08-01 NOTE — Assessment & Plan Note (Signed)
 ABIs today remain noncompressible but waveforms are strong and digital pressures are normal bilaterally.  Wound has healed.  Continue current medical regimen.  We will now stretch out his follow-up to 6 months.

## 2024-09-08 ENCOUNTER — Encounter: Payer: Self-pay | Admitting: Oncology

## 2024-09-15 ENCOUNTER — Telehealth: Payer: Self-pay | Admitting: Pharmacy Technician

## 2024-10-07 ENCOUNTER — Encounter: Payer: Self-pay | Admitting: Oncology

## 2024-10-08 ENCOUNTER — Encounter: Payer: Self-pay | Admitting: Oncology

## 2024-10-08 ENCOUNTER — Telehealth: Payer: Self-pay | Admitting: Physician Assistant

## 2024-10-08 ENCOUNTER — Other Ambulatory Visit (HOSPITAL_COMMUNITY): Payer: Self-pay

## 2024-10-08 ENCOUNTER — Telehealth: Payer: Self-pay | Admitting: Pharmacy Technician

## 2024-10-08 NOTE — Telephone Encounter (Signed)
 Patient Advocate Encounter   The patient was approved for a [employer] grant that will help cover the cost of entresto  Total amount awarded, 7500.  Effective: 09/08/24 - 09/07/25   APW:[id number] ERW:[id number] Hmnle:[id number] PI:[id number] [employer] ID: [id number]   Pharmacy provided with approval and processing information. Patient informed via telephone

## 2024-10-08 NOTE — Telephone Encounter (Signed)
 Pt of Dr. Gollan. Can't afford Entresto  refill. Please advise.

## 2024-10-08 NOTE — Telephone Encounter (Signed)
 Pt calling to ask if there are any resources for Entresto . Please advise.   Patient calling the office for samples of medication:   1.  What medication and dosage are you requesting samples for? ENTRESTO  24-26 MG   2.  Are you currently out of this medication? Pt is out of medication and cannot afford refill. Please advise.

## 2024-10-09 MED ORDER — SACUBITRIL-VALSARTAN 24-26 MG PO TABS: 1.0000 | ORAL_TABLET | Freq: Two times a day (BID) | ORAL | 3 refills | Status: AC

## 2024-10-09 NOTE — Telephone Encounter (Signed)
Prescription sent to [facility].

## 2024-10-14 ENCOUNTER — Ambulatory Visit: Attending: Physician Assistant

## 2024-10-14 DIAGNOSIS — I255 Ischemic cardiomyopathy: Secondary | ICD-10-CM | POA: Diagnosis not present

## 2024-10-14 DIAGNOSIS — I5020 Unspecified systolic (congestive) heart failure: Secondary | ICD-10-CM

## 2024-10-14 LAB — ECHOCARDIOGRAM LIMITED
AR max vel: 2.71 cm2
AV Area VTI: 2.79 cm2
AV Area mean vel: 2.68 cm2
AV Mean grad: 2 mmHg
AV Peak grad: 4.6 mmHg
Ao pk vel: 1.07 m/s
Area-P 1/2: 2.73 cm2
S' Lateral: 4.5 cm

## 2024-10-15 ENCOUNTER — Ambulatory Visit: Payer: Self-pay | Admitting: Physician Assistant

## 2024-10-19 NOTE — Progress Notes (Unsigned)
 Date:  10/20/2024   ID:  Kenneth Duarte, DOB [birth year], MRN [id number]  Patient Location:  [address] [place] [address]   Provider location:   [facility], [place] office  PCP:  [name], MD  Cardiologist:  Perla CRIS Nicolas   Chief Complaint  Patient presents with   7 month follow up     Discuss Echo results.  Patient denies chest pain or shortness of breath.     History of Present Illness:    Kenneth Duarte is a 78 y.o. male  past medical history of Smoker quit 1980 CAD, CABG  in 03/2017.  3V CABG with LIMA to LAD, SVG to OM, and SVG to PDA by Dr. Fleeta Brooke EF 50 to 55% by TEE Chest pain, transferred to [facility] Had stent placed to ostial vein graft to OM July 2018, PROMUS [facility] MR [id number]  Catheterization August 31, 2023 for angina, medical management recommended Echocardiogram July 2024 EF 35 to 40% Creatinine 1.4 Labile HTN History of PAD with lower extremity arterial stenting Who presents for follow-up of his coronary disease, CABG  Last clinic visit with myself 11/24 Last seen by one of our providers April 2025  Echocardiogram April 2025 Ejection fraction of 50 to 55%, normal RV size with mildly reduced function No significant valvular heart disease  Home BP 100-130 systolic Rare orthostasis  Active, sells produce, 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 He was readmitted with nonhealing wound and underwent remaining fifth metatarsal  bone amputation in 04/2023.   Fall in July 2024, rib fracture, slipped on wet ground  R/LHC on 08/31/2023 showed significant underlying three-vessel CAD with chronically occluded ostial LCx and chronic subtotal occlusion of the mid RCA. SVG to rPDA was chronically occluded. SVG to OM, which was stented previously, was now chronically occluded at the ostium. LIMA to LAD was patent with no significant anastomosis stenosis. The distal LAD provided collaterals to the rPDA and a diagonal branch provided collaterals to the OM's. RHC showed normal filling pressures, minimal pulmonary pretension, and normal cardiac output. There were no PCI options with both RCA and LCx receiving collaterals from the LAD.   ST depression in lateral leads and elevated troponin @ 1.57--> 6.41. 3V CAD with 75% occl LIMA--> LAD near anastomotic site, occl SVG--> PDA and 95% occl SVG--> OM1 and 95% occl of native vessel distal to anastomotic site.   Cardiac catheterization July 2018 1. Severe three-vessel coronary artery disease with heavily calcified, eccentric 50-60% stenosis distal left main, occluded proximal left circumflex, subtotal mid RCA. 2. Patent LIMA to mid LAD with discrete 75% stenosis near anastomotic site 3. Occluded SVG to PDA 4. Patent SVG to OM1 with a long diffuse 95% stenosis proximal segment of graft, with near 60-70% stenosis at the anastomotic site, 95% stenosis in the native vessel distal to the anastomotic site, with retrograde filling of left circumflex 5. Mildly reduced left ventricular function, with inferior-posterior wall akinesis   Transferred to [facility] Had stent  placed to ostial vein graft to OM Dist Graft lesion, 75 %stenosed. There is more disease noted past the graft insertion in the OM and in the distal circumflex.   Past Medical History:  Diagnosis Date   Arthritis    Atherosclerosis of both carotid arteries    Benign neoplasm of sigmoid colon    Bilateral leg edema    Cardiomyopathy  (HCC)    Cellulitis in diabetic foot (HCC)    Chronic pain syndrome    Congestive heart failure (HCC) 1980   COPD (chronic obstructive pulmonary disease) (HCC)    Coronary artery disease    a. s/p 3V CABG (03/2017). b. NSTEMI 06/2017 with progressive graft disease, s/p DES to SVG-OM.   DM (diabetes mellitus), type 2 (HCC)    Dyspnea    with exertion   ED (erectile dysfunction)    GERD (gastroesophageal reflux disease)    Gout    Headache    Hyperlipemia    Hypertension    CONTROLLED ON MEDS   IDA (iron  deficiency anemia) 10/21/2019   MRSA nasal colonization 2018   MSSA (methicillin susceptible Staphylococcus aureus) 04/17/2024   Neuropathy of both feet    NSTEMI (non-ST elevated myocardial infarction) (HCC)    Osteomyelitis of foot (HCC)    S/P amputation of lesser toe, left    S/P CABG x 3    Seborrheic keratosis    Sinus congestion    Stable angina    Vertigo    Wears dentures    PARTIAL UPPER   Past Surgical History:  Procedure Laterality Date   AMPUTATION TOE Left 04/18/2024   Procedure: AMPUTATION, TOE, THIRD;  Surgeon: [name], DPM;  Location: [facility] ORS;  Service: Orthopedics/Podiatry;  Laterality: Left;   AMPUTATION TOE Left 07/03/2024   Procedure: AMPUTATION, TOE;  Surgeon: [name], DPM;  Location: [facility] OR[facility];  Service: Orthopedics/Podiatry;  Laterality: Left;  Fourth toe   COLONOSCOPY WITH PROPOFOL  N/A 10/25/2015   Procedure: COLONOSCOPY WITH PROPOFOL ;  Surgeon: Rogelia Copping, MD;  Location: [facility];  Service: Endoscopy;  Laterality: N/A;  DIABETIC-ORAL MEDS   COLONOSCOPY WITH PROPOFOL  N/A 10/22/2020   Procedure: COLONOSCOPY CANCELED;  Surgeon: Copping Rogelia, MD;  Location: [facility];  Service: Endoscopy;  Laterality: N/A;  procedure aborted poor prep   COLONOSCOPY WITH PROPOFOL  N/A 10/25/2020   Procedure: COLONOSCOPY WITH PROPOFOL ;  Surgeon: Copping Rogelia, MD;  Location: [facility];  Service: Endoscopy;  Laterality: N/A;   CORONARY  ARTERY BYPASS GRAFT N/A 04/10/2017   Procedure: CORONARY ARTERY BYPASS GRAFTING (CABG) x three , using left internal mammary artery and right leg greater saphenous vein harvested endoscopically;  Surgeon: Maude Fleeta Ochoa, MD;  Location: [facility] OR;  Service: Open Heart Surgery;  Laterality: N/A;   CORONARY ARTERY BYPASS GRAFT     CORONARY STENT INTERVENTION N/A 06/19/2017   Procedure: Coronary Stent Intervention;  Surgeon: Dann Candyce RAMAN, MD;  Location: [facility] INVASIVE CV LAB;  Service: Cardiovascular;  Laterality: N/A;   ESOPHAGOGASTRODUODENOSCOPY (EGD) WITH PROPOFOL  N/A 10/22/2020   Procedure: ESOPHAGOGASTRODUODENOSCOPY (EGD) WITH PROPOFOL ;  Surgeon: Copping Rogelia, MD;  Location: [facility];  Service: Endoscopy;  Laterality: N/A;  Diabetic - oral meds   EXCISION PARTIAL PHALANX Right 03/22/2023   Procedure: EXCISION  PHALANX;  Surgeon: [name], DPM;  Location: [facility] OR[facility];  Service: Podiatry;  Laterality: Right;   INCISION AND DRAINAGE Right 04/19/2023   Procedure: INCISION AND DRAINAGE;  Surgeon: Neill Boas, DPM;  Location: [facility] OR[facility];  Service: Podiatry;  Laterality: Right;   IRRIGATION AND DEBRIDEMENT FOOT Right 03/22/2023   Procedure: IRRIGATION AND DEBRIDEMENT FOOT;  Surgeon: [name], DPM;  Location: [facility] OR[facility];  Service: Podiatry;  Laterality: Right;   KNEE SURGERY Right    over 20 years ago   LEFT HEART CATH AND CORONARY ANGIOGRAPHY Left 02/22/2017   Procedure: Left Heart Cath and Coronary Angiography;  Surgeon: Wolm JINNY Rhyme, MD;  Location: [facility] INVASIVE CV LAB;  Service: Cardiovascular;  Laterality: Left;   LEFT HEART CATH AND CORS/GRAFTS ANGIOGRAPHY N/A 06/18/2017   Procedure: Left Heart Cath and Cors/Grafts Angiography and PCI;  Surgeon: Ammon Blunt, MD;  Location: [facility] INVASIVE CV LAB;  Service: Cardiovascular;  Laterality: N/A;   LOWER EXTREMITY ANGIOGRAPHY Right 03/23/2023   Procedure: Lower Extremity Angiography;  Surgeon: Marea Selinda RAMAN, MD;  Location: [facility] INVASIVE CV LAB;   Service: Cardiovascular;  Laterality: Right;   LOWER EXTREMITY INTERVENTION  03/23/2023   Procedure: LOWER EXTREMITY INTERVENTION;  Surgeon: Marea Selinda RAMAN, MD;  Location: [facility] INVASIVE CV LAB;  Service: Cardiovascular;;   POLYPECTOMY  10/25/2015   Procedure: POLYPECTOMY;  Surgeon: Rogelia Copping, MD;  Location: [facility];  Service: Endoscopy;;   RIGHT/LEFT HEART CATH AND CORONARY/GRAFT ANGIOGRAPHY Bilateral 08/31/2023   Procedure: RIGHT/LEFT HEART CATH AND CORONARY/GRAFT ANGIOGRAPHY;  Surgeon: Darron Deatrice LABOR, MD;  Location: [facility] INVASIVE CV LAB;  Service: Cardiovascular;  Laterality: Bilateral;   ROTATOR CUFF REPAIR Right 2012   TEE WITHOUT CARDIOVERSION N/A 04/10/2017   Procedure: TRANSESOPHAGEAL ECHOCARDIOGRAM (TEE);  Surgeon: Maude Fleeta Ochoa, MD;  Location: [facility] OR;  Service: Open Heart Surgery;  Laterality: N/A;     Current Meds  Medication Sig   aspirin  81 MG tablet Take 1 tablet (81 mg total) by mouth daily.   atorvastatin  (LIPITOR ) 80 MG tablet TAKE 1 TABLET AT BEDTIME   carvedilol  (COREG ) 3.125 MG tablet Take 3.125 mg by mouth 2 (two) times daily.   clopidogrel  (PLAVIX ) 75 MG tablet TAKE 1 TABLET EVERY DAY   dapagliflozin  propanediol (FARXIGA ) 10 MG TABS tablet Take 1 tablet (10 mg total) by mouth daily.   DENTA 5000 PLUS 1.1 % CREA dental cream Place 1 Application onto teeth at bedtime.   gabapentin  (NEURONTIN ) 800 MG tablet Take 1,600 mg by mouth 3 (three) times daily.   hydrALAZINE  (APRESOLINE ) 25 MG tablet Take 1 tablet (25 mg total) by mouth 3 (three) times daily as needed (For blood pressure greater then 160.).   meclizine  (ANTIVERT ) 25 MG tablet TAKE 1 TABLET TWICE DAILY AS NEEDED FOR DIZZINESS   metFORMIN  (GLUCOPHAGE -XR) 500 MG 24 hr tablet Take 500 mg by mouth daily.   nitroGLYCERIN  (NITROSTAT ) 0.4 MG SL tablet Place 1 tablet (0.4 mg total) under the tongue every 5 (five) minutes as needed for chest pain.   oxyCODONE -acetaminophen  (PERCOCET) 10-325 MG tablet Take 1 tablet  by mouth See admin instructions. Every 3-4 hours   pantoprazole  (PROTONIX ) 40 MG tablet TAKE 1 TABLET EVERY DAY   sacubitril -valsartan  (ENTRESTO ) 24-26 MG Take 1 tablet by mouth 2 (two) times daily.   vitamin B-12 (CYANOCOBALAMIN ) 1000 MCG tablet Take 1,000 mcg by mouth daily.   VITAMIN D -1000 MAX ST 25 MCG (1000 UT) tablet Take 1,000 Units by mouth daily.     Allergies:   Blood-group specific substance and Cortisone   Social History   Tobacco Use   Smoking status: Former    Current packs/day: 0.00    Average packs/day: 2.0 packs/day for 15.0 years (30.0 ttl pk-yrs)    Types:  Cigarettes    Start date: 06/29/1964    Quit date: 06/30/1979    Years since quitting: 45.3   Smokeless tobacco: Never  Vaping Use   Vaping status: Never Used  Substance Use Topics   Alcohol use: No    Alcohol/week: 0.0 standard drinks of alcohol    Comment: QUIT IN 1980   Drug use: No     Family Hx: The patient's family history includes Cancer in his father and mother; Diabetes in his brother and sister; Heart attack in his father; Heart disease in his brother and father; Prostate cancer in his brother. There is no history of Bladder Cancer or Kidney disease.  ROS:   Please see the history of present illness.    Review of Systems  Constitutional: Negative.   HENT: Negative.    Respiratory: Negative.    Cardiovascular: Negative.   Gastrointestinal: Negative.   Musculoskeletal: Negative.   Neurological: Negative.   Psychiatric/Behavioral: Negative.    All other systems reviewed and are negative.    Labs/Other Tests and Data Reviewed:    Recent Labs: 07/03/2024: BUN 12; Creatinine, Ser 1.12; Hemoglobin 10.0; Platelets 188; Potassium 3.8; Sodium 139   Recent Lipid Panel Lab Results  Component Value Date/Time   CHOL 92 (L) 09/11/2023 08:59 AM   TRIG 221 (H) 09/11/2023 08:59 AM   HDL 26 (L) 09/11/2023 08:59 AM   CHOLHDL 3.5 09/11/2023 08:59 AM   CHOLHDL 4.1 06/18/2017 11:37 PM   LDLCALC 31  09/11/2023 08:59 AM    Wt Readings from Last 3 Encounters:  10/20/24 191 lb 4 oz (86.8 kg)  08/01/24 181 lb 6.4 oz (82.3 kg)  07/02/24 185 lb (83.9 kg)     Exam:    BP (!) 110/52 (BP Location: Left Arm, Patient Position: Sitting)   Pulse 64   Ht 5' 11 (1.803 m)   Wt 191 lb 4 oz (86.8 kg)   SpO2 98%   BMI 26.67 kg/m  Constitutional:  oriented to person, place, and time. No distress.  HENT:  Head: Normocephalic and atraumatic.  Eyes:  no discharge. No scleral icterus.  Neck: Normal range of motion. Neck supple. No JVD present.  Cardiovascular: Normal rate, regular rhythm, normal heart sounds and intact distal pulses. Exam reveals no gallop and no friction rub. No edema No murmur heard. Pulmonary/Chest: Effort normal and breath sounds normal. No stridor. No respiratory distress.  no wheezes.  no rales.  no tenderness.  Abdominal: Soft.  no distension.  no tenderness.  Musculoskeletal: Normal range of motion.  no  tenderness or deformity.  Neurological:  normal muscle tone. Coordination normal. No atrophy Skin: Skin is warm and dry. No rash noted. not diaphoretic.  Psychiatric:  normal mood and affect. behavior is normal. Thought content normal.    ASSESSMENT & PLAN:    Coronary artery disease of bypass graft of native heart with stable angina pectoris (HCC) - CABG 2018 cardiac catheterization September 2024, medical management recommended A1c improved, cholesterol at goal, non-smoker  Labile hypertension Huge swings in his blood pressure from 90 systolic up to over 200 within short time frames For low pressure hydrates and does beets at home, denies near-syncope or orthostasis symptoms  Type 2 diabetes mellitus with other circulatory complication, without long-term current use of insulin  (HCC) A1c well-controlled, managed by primary care on Farxiga , metformin    Dyslipidemia Cholesterol is at goal on the current lipid regimen. No changes to the medications were  made.  S/P CABG x 3 -  Prior surgery 2018 Non-smoker, diabetes well controlled, lipids controlled Currently with no symptoms of angina. No further workup at this time. Continue current medication regimen.  Cardiomyopathy, ischemic Continue carvedilol , Entresto , Farxiga  Rare low pressures, able to tolerate so far Continued improvement on echo EF 50 to 55% up from 35  Signed, Evalene Lunger, MD  10/20/2024 10:59 AM    [facility] [address], [place], [place] [address]

## 2024-10-20 ENCOUNTER — Encounter: Payer: Self-pay | Admitting: Cardiovascular Disease

## 2024-10-20 ENCOUNTER — Ambulatory Visit: Attending: Cardiovascular Disease | Admitting: Cardiovascular Disease

## 2024-10-20 VITALS — BP 110/52 | HR 64 | Ht 71.0 in | Wt 191.2 lb

## 2024-10-20 DIAGNOSIS — I739 Peripheral vascular disease, unspecified: Secondary | ICD-10-CM

## 2024-10-20 DIAGNOSIS — R0989 Other specified symptoms and signs involving the circulatory and respiratory systems: Secondary | ICD-10-CM

## 2024-10-20 DIAGNOSIS — I5020 Unspecified systolic (congestive) heart failure: Secondary | ICD-10-CM | POA: Diagnosis not present

## 2024-10-20 DIAGNOSIS — Z951 Presence of aortocoronary bypass graft: Secondary | ICD-10-CM | POA: Diagnosis not present

## 2024-10-20 DIAGNOSIS — I255 Ischemic cardiomyopathy: Secondary | ICD-10-CM

## 2024-10-20 DIAGNOSIS — E785 Hyperlipidemia, unspecified: Secondary | ICD-10-CM

## 2024-10-20 DIAGNOSIS — I25810 Atherosclerosis of coronary artery bypass graft(s) without angina pectoris: Secondary | ICD-10-CM

## 2024-10-20 NOTE — Patient Instructions (Signed)

## 2024-11-14 ENCOUNTER — Other Ambulatory Visit: Payer: Self-pay | Admitting: Specialist

## 2024-11-14 DIAGNOSIS — R918 Other nonspecific abnormal finding of lung field: Secondary | ICD-10-CM

## 2024-11-16 ENCOUNTER — Ambulatory Visit: Payer: Self-pay | Admitting: Cardiovascular Disease

## 2024-12-02 ENCOUNTER — Ambulatory Visit: Admission: RE | Admit: 2024-12-02 | Discharge: 2024-12-02 | Attending: Specialist | Admitting: Specialist

## 2024-12-02 DIAGNOSIS — R918 Other nonspecific abnormal finding of lung field: Secondary | ICD-10-CM | POA: Diagnosis present

## 2024-12-08 ENCOUNTER — Other Ambulatory Visit: Payer: Self-pay

## 2024-12-09 MED ORDER — DAPAGLIFLOZIN PROPANEDIOL 10 MG PO TABS: 10.0000 mg | ORAL_TABLET | Freq: Every day | ORAL | 3 refills | Status: AC

## 2025-01-01 ENCOUNTER — Encounter: Payer: Self-pay | Admitting: Oncology

## 2025-01-07 ENCOUNTER — Encounter: Payer: Self-pay | Admitting: Oncology

## 2025-01-07 ENCOUNTER — Inpatient Hospital Stay: Attending: Oncology | Admitting: Oncology

## 2025-01-07 ENCOUNTER — Ambulatory Visit: Payer: Self-pay | Admitting: Oncology

## 2025-01-07 ENCOUNTER — Inpatient Hospital Stay

## 2025-01-07 VITALS — BP 151/70 | HR 68 | Temp 96.8°F | Resp 18 | Wt 199.8 lb

## 2025-01-07 DIAGNOSIS — N1832 Chronic kidney disease, stage 3b: Secondary | ICD-10-CM | POA: Insufficient documentation

## 2025-01-07 DIAGNOSIS — D649 Anemia, unspecified: Secondary | ICD-10-CM | POA: Insufficient documentation

## 2025-01-07 DIAGNOSIS — R5382 Chronic fatigue, unspecified: Secondary | ICD-10-CM | POA: Insufficient documentation

## 2025-01-07 DIAGNOSIS — Z87891 Personal history of nicotine dependence: Secondary | ICD-10-CM | POA: Diagnosis not present

## 2025-01-07 LAB — IRON AND TIBC
Iron: 71 ug/dL (ref 45–182)
Saturation Ratios: 21 % (ref 17.9–39.5)
TIBC: 343 ug/dL (ref 250–450)
UIBC: 272 ug/dL

## 2025-01-07 LAB — CBC WITH DIFFERENTIAL/PLATELET
Abs Immature Granulocytes: 0.02 K/uL (ref 0.00–0.07)
Basophils Absolute: 0.1 K/uL (ref 0.0–0.1)
Basophils Relative: 1 %
Eosinophils Absolute: 0.2 K/uL (ref 0.0–0.5)
Eosinophils Relative: 3 %
HCT: 35.2 % — ABNORMAL LOW (ref 39.0–52.0)
Hemoglobin: 11.2 g/dL — ABNORMAL LOW (ref 13.0–17.0)
Immature Granulocytes: 0 %
Lymphocytes Relative: 38 %
Lymphs Abs: 2 K/uL (ref 0.7–4.0)
MCH: 28.9 pg (ref 26.0–34.0)
MCHC: 31.8 g/dL (ref 30.0–36.0)
MCV: 90.7 fL (ref 80.0–100.0)
Monocytes Absolute: 0.5 K/uL (ref 0.1–1.0)
Monocytes Relative: 9 %
Neutro Abs: 2.6 K/uL (ref 1.7–7.7)
Neutrophils Relative %: 49 %
Platelets: 154 K/uL (ref 150–400)
RBC: 3.88 MIL/uL — ABNORMAL LOW (ref 4.22–5.81)
RDW: 14.7 % (ref 11.5–15.5)
WBC: 5.3 K/uL (ref 4.0–10.5)
nRBC: 0 % (ref 0.0–0.2)

## 2025-01-07 LAB — COMPREHENSIVE METABOLIC PANEL WITH GFR
ALT: 7 U/L (ref 0–44)
AST: 16 U/L (ref 15–41)
Albumin: 3.9 g/dL (ref 3.5–5.0)
Alkaline Phosphatase: 104 U/L (ref 38–126)
Anion gap: 7 (ref 5–15)
BUN: 13 mg/dL (ref 8–23)
CO2: 29 mmol/L (ref 22–32)
Calcium: 9 mg/dL (ref 8.9–10.3)
Chloride: 106 mmol/L (ref 98–111)
Creatinine, Ser: 1.77 mg/dL — ABNORMAL HIGH (ref 0.61–1.24)
GFR, Estimated: 39 mL/min — ABNORMAL LOW
Glucose, Bld: 164 mg/dL — ABNORMAL HIGH (ref 70–99)
Potassium: 5 mmol/L (ref 3.5–5.1)
Sodium: 142 mmol/L (ref 135–145)
Total Bilirubin: 0.6 mg/dL (ref 0.0–1.2)
Total Protein: 7 g/dL (ref 6.5–8.1)

## 2025-01-07 LAB — FERRITIN: Ferritin: 30 ng/mL (ref 24–336)

## 2025-01-07 LAB — TSH: TSH: 2.39 u[IU]/mL (ref 0.350–4.500)

## 2025-01-07 NOTE — Assessment & Plan Note (Signed)
 Check TSH -normal

## 2025-01-07 NOTE — Progress Notes (Signed)
 " Hematology/Oncology Consult note [phone] [phone]        REFERRING PROVIDER: [name], MD   CHIEF COMPLAINTS/REASON FOR VISIT:  Evaluation of anemia.    ASSESSMENT & PLAN:   Normocytic anemia Likely due to anemia from CKD.  Normal folate and b12 level, done at PCP clinic.  Lab Results  Component Value Date   HGB 11.2 (L) 01/07/2025   TIBC 343 01/07/2025   IRONPCTSAT 21 01/07/2025   FERRITIN 30 01/07/2025    Recommend venofer  200mg  x 2 to further improve iron  store.    Chronic fatigue Check TSH - normal.   CKD stage 3b, GFR 30-44 ml/min (HCC) Encourage oral hydration and avoid nephrotoxins.  10/2019 myeloma panel showed negative M protein. Increase kappa and lamda light chain with normal ratio.  Will repeat protein electrophoresis and light chain.    Orders Placed This Encounter  Procedures   CBC with Differential/Platelet    Standing Status:   Future    Number of Occurrences:   1    Expected Date:   01/07/2025    Expiration Date:   04/07/2025   Comprehensive metabolic panel with GFR    Standing Status:   Future    Number of Occurrences:   1    Expected Date:   01/07/2025    Expiration Date:   04/07/2025   Iron  and TIBC    Standing Status:   Future    Number of Occurrences:   1    Expected Date:   01/07/2025    Expiration Date:   04/07/2025   Ferritin    Standing Status:   Future    Number of Occurrences:   1    Expected Date:   01/07/2025    Expiration Date:   04/07/2025   TSH    Standing Status:   Future    Number of Occurrences:   1    Expected Date:   01/07/2025    Expiration Date:   04/07/2025   Follow up in 3 months.    All questions were answered. The patient knows to call the clinic with any problems, questions or concerns.  Zelphia Cap, MD, PhD [facility] Hematology Oncology 01/07/2025   HISTORY OF PRESENTING ILLNESS:   Kenneth Duarte is a  79 y.o.  male with PMH listed below was seen in consultation at the  request of  Aycock, Maxie LABOR, MD  for evaluation of anemia.   Discussed the use of AI scribe software for clinical note transcription with the patient, who gave verbal consent to proceed.    He was referred for evaluation after laboratory testing on December 01, 2024 at [facility] revealed a hemoglobin of 11.8 g/dL. Chemistry panel, vitamin B12 (1149), and folate (6.5) were within normal limits. Iron  studies were not performed at that time. He was first evaluated for iron  deficiency anemia in 2021 and previously received iron  supplementation, but is not currently taking iron .  He reports increased fatigue and decreased energy, with minimal activity now resulting in significant tiredness. He notes that in the fall he was able to perform more work without fatigue, but currently experiences worsening symptoms with less exertion. He attributes some of his fatigue to decreased activity and deconditioning. He denies melena, hematochezia, abdominal pain, or weight loss. He reports weight gain, which he attributes to decreased activity and increased food intake. Appetite is normal.    MEDICAL HISTORY:  Past Medical History:  Diagnosis Date  Arthritis    Atherosclerosis of both carotid arteries    Benign neoplasm of sigmoid colon    Bilateral leg edema    Cardiomyopathy (HCC)    Cellulitis in diabetic foot (HCC)    Chronic pain syndrome    Congestive heart failure (HCC) 1980   COPD (chronic obstructive pulmonary disease) (HCC)    Coronary artery disease    a. s/p 3V CABG (03/2017). b. NSTEMI 06/2017 with progressive graft disease, s/p DES to SVG-OM.   DM (diabetes mellitus), type 2 (HCC)    Dyspnea    with exertion   ED (erectile dysfunction)    GERD (gastroesophageal reflux disease)    Gout    Headache    Hyperlipemia    Hypertension    CONTROLLED ON MEDS   IDA (iron  deficiency anemia) 10/21/2019   MRSA nasal colonization 2018   MSSA (methicillin susceptible Staphylococcus aureus)  04/17/2024   Neuropathy of both feet    NSTEMI (non-ST elevated myocardial infarction) (HCC)    Osteomyelitis of foot (HCC)    S/P amputation of lesser toe, left    S/P CABG x 3    Seborrheic keratosis    Sinus congestion    Stable angina    Vertigo    Wears dentures    PARTIAL UPPER    SURGICAL HISTORY: Past Surgical History:  Procedure Laterality Date   AMPUTATION TOE Left 04/18/2024   Procedure: AMPUTATION, TOE, THIRD;  Surgeon: [name], DPM;  Location: [facility] ORS;  Service: Orthopedics/Podiatry;  Laterality: Left;   AMPUTATION TOE Left 07/03/2024   Procedure: AMPUTATION, TOE;  Surgeon: [name], DPM;  Location: [facility];  Service: Orthopedics/Podiatry;  Laterality: Left;  Fourth toe   COLONOSCOPY WITH PROPOFOL  N/A 10/25/2015   Procedure: COLONOSCOPY WITH PROPOFOL ;  Surgeon: Rogelia Copping, MD;  Location: [facility];  Service: Endoscopy;  Laterality: N/A;  DIABETIC-ORAL MEDS   COLONOSCOPY WITH PROPOFOL  N/A 10/22/2020   Procedure: COLONOSCOPY CANCELED;  Surgeon: Copping Rogelia, MD;  Location: [facility];  Service: Endoscopy;  Laterality: N/A;  procedure aborted poor prep   COLONOSCOPY WITH PROPOFOL  N/A 10/25/2020   Procedure: COLONOSCOPY WITH PROPOFOL ;  Surgeon: Copping Rogelia, MD;  Location: [facility];  Service: Endoscopy;  Laterality: N/A;   CORONARY ARTERY BYPASS GRAFT N/A 04/10/2017   Procedure: CORONARY ARTERY BYPASS GRAFTING (CABG) x three , using left internal mammary artery and right leg greater saphenous vein harvested endoscopically;  Surgeon: Maude Fleeta Ochoa, MD;  Location: [facility] OR;  Service: Open Heart Surgery;  Laterality: N/A;   CORONARY ARTERY BYPASS GRAFT     CORONARY STENT INTERVENTION N/A 06/19/2017   Procedure: Coronary Stent Intervention;  Surgeon: Dann Candyce RAMAN, MD;  Location: [facility] INVASIVE CV LAB;  Service: Cardiovascular;  Laterality: N/A;   ESOPHAGOGASTRODUODENOSCOPY (EGD) WITH PROPOFOL  N/A 10/22/2020   Procedure:  ESOPHAGOGASTRODUODENOSCOPY (EGD) WITH PROPOFOL ;  Surgeon: Copping Rogelia, MD;  Location: [facility];  Service: Endoscopy;  Laterality: N/A;  Diabetic - oral meds   EXCISION PARTIAL PHALANX Right 03/22/2023   Procedure: EXCISION  PHALANX;  Surgeon: [name], DPM;  Location: [facility];  Service: Podiatry;  Laterality: Right;   INCISION AND DRAINAGE Right 04/19/2023   Procedure: INCISION AND DRAINAGE;  Surgeon: Neill Boas, DPM;  Location: [facility];  Service: Podiatry;  Laterality: Right;   IRRIGATION AND DEBRIDEMENT FOOT Right 03/22/2023   Procedure: IRRIGATION AND DEBRIDEMENT FOOT;  Surgeon: [name], DPM;  Location: [facility];  Service: Podiatry;  Laterality: Right;   KNEE  SURGERY Right    over 20 years ago   LEFT HEART CATH AND CORONARY ANGIOGRAPHY Left 02/22/2017   Procedure: Left Heart Cath and Coronary Angiography;  Surgeon: Wolm JINNY Rhyme, MD;  Location: [facility] INVASIVE CV LAB;  Service: Cardiovascular;  Laterality: Left;   LEFT HEART CATH AND CORS/GRAFTS ANGIOGRAPHY N/A 06/18/2017   Procedure: Left Heart Cath and Cors/Grafts Angiography and PCI;  Surgeon: Ammon Blunt, MD;  Location: [facility] INVASIVE CV LAB;  Service: Cardiovascular;  Laterality: N/A;   LOWER EXTREMITY ANGIOGRAPHY Right 03/23/2023   Procedure: Lower Extremity Angiography;  Surgeon: Marea Selinda RAMAN, MD;  Location: [facility] INV[facility] CV LAB;  Service: Cardiovascular;  Laterality: Right;   LOWER EXTREMITY INTERVENTION  03/23/2023   Procedure: LOWER EXTREMITY INTERVENTION;  Surgeon: Marea Selinda RAMAN, MD;  Location: [facility] INV[facility] CV LAB;  Service: Cardiovascular;;   POLYPECTOMY  10/25/2015   Procedure: POLYPECTOMY;  Surgeon: Rogelia Copping, MD;  Location: [facility];  Service: Endoscopy;;   RIGHT/LEFT HEART CATH AND CORONARY/GRAFT ANGIOGRAPHY Bilateral 08/31/2023   Procedure: RIGHT/LEFT HEART CATH AND CORONARY/GRAFT ANGIOGRAPHY;  Surgeon: Darron Deatrice LABOR, MD;  Location: [facility] INVASIVE CV LAB;  Service: Cardiovascular;   Laterality: Bilateral;   ROTATOR CUFF REPAIR Right 2012   TEE WITHOUT CARDIOVERSION N/A 04/10/2017   Procedure: TRANSESOPHAGEAL ECHOCARDIOGRAM (TEE);  Surgeon: Maude Fleeta Ochoa, MD;  Location: [facility] OR;  Service: Open Heart Surgery;  Laterality: N/A;    SOCIAL HISTORY: Social History   Socioeconomic History   Marital status: Married    Spouse name: Not on file   Number of children: Not on file   Years of education: Not on file   Highest education level: Not on file  Occupational History   Not on file  Tobacco Use   Smoking status: Former    Current packs/day: 0.00    Average packs/day: 2.0 packs/day for 15.0 years (30.0 ttl pk-yrs)    Types: Cigarettes    Start date: 06/29/1964    Quit date: 06/30/1979    Years since quitting: 45.5   Smokeless tobacco: Never  Vaping Use   Vaping status: Never Used  Substance and Sexual Activity   Alcohol use: No    Alcohol/week: 0.0 standard drinks of alcohol    Comment: QUIT IN 1980   Drug use: No   Sexual activity: Yes    Partners: Male    Birth control/protection: Other-see comments  Other Topics Concern   Not on file  Social History Narrative   Not on file   Social Drivers of Health   Tobacco Use: Medium Risk (01/07/2025)   Patient History    Smoking Tobacco Use: Former    Smokeless Tobacco Use: Never    Passive Exposure: Not on file  Financial Resource Strain: Low Risk  (01/10/2024)   Received from [facility]   Overall Financial Resource Strain (CARDIA)    Difficulty of Paying Living Expenses: Not hard at all  Food Insecurity: No Food Insecurity (07/02/2024)   Epic    Worried About Radiation Protection Practitioner of Food in the Last Year: Never true    Ran Out of Food in the Last Year: Never true  Transportation Needs: No Transportation Needs (07/02/2024)   Epic    Lack of Transportation (Medical): No    Lack of Transportation (Non-Medical): No  Physical Activity: Not on file  Stress: Not on file  Social Connections: Socially  Integrated (07/02/2024)   Social Connection and Isolation Panel    Frequency of Communication with Friends and  Family: Three times a week    Frequency of Social Gatherings with Friends and Family: Three times a week    Attends Religious Services: 1 to 4 times per year    Active Member of Clubs or Organizations: No    Attends Banker Meetings: More than 4 times per year    Marital Status: Married  Catering Manager Violence: Not At Risk (07/02/2024)   Epic    Fear of Current or Ex-Partner: No    Emotionally Abused: No    Physically Abused: No    Sexually Abused: No  Depression (PHQ2-9): Not on file  Alcohol Screen: Not on file  Housing: Low Risk  (12/19/2024)   Received from [facility]   Epic    In the last 12 months, was there a time when you were not able to pay the mortgage or rent on time?: No    In the past 12 months, how many times have you moved where you were living?: 0    At any time in the past 12 months, were you homeless or living in a shelter (including now)?: No  Utilities: Not At Risk (07/02/2024)   Epic    Threatened with loss of utilities: No  Health Literacy: Not on file    FAMILY HISTORY: Family History  Problem Relation Age of Onset   Cancer Mother    Cancer Father    Heart disease Father    Heart attack Father    Diabetes Sister    Heart disease Brother    Diabetes Brother    Prostate cancer Brother    Bladder Cancer Neg Hx    Kidney disease Neg Hx     ALLERGIES:  is allergic to blood-group specific substance and cortisone.  MEDICATIONS:  Current Outpatient Medications  Medication Sig Dispense Refill   aspirin  81 MG tablet Take 1 tablet (81 mg total) by mouth daily. 90 tablet 1   atorvastatin  (LIPITOR ) 80 MG tablet TAKE 1 TABLET AT BEDTIME 90 tablet 3   carvedilol  (COREG ) 3.125 MG tablet Take 3.125 mg by mouth 2 (two) times daily.     clopidogrel  (PLAVIX ) 75 MG tablet TAKE 1 TABLET EVERY DAY 90 tablet 3   dapagliflozin   propanediol (FARXIGA ) 10 MG TABS tablet Take 1 tablet (10 mg total) by mouth daily. 90 tablet 3   DENTA 5000 PLUS 1.1 % CREA dental cream Place 1 Application onto teeth at bedtime.     gabapentin  (NEURONTIN ) 800 MG tablet Take 1,600 mg by mouth 3 (three) times daily.     hydrALAZINE  (APRESOLINE ) 25 MG tablet Take 1 tablet (25 mg total) by mouth 3 (three) times daily as needed (For blood pressure greater then 160.).     meclizine  (ANTIVERT ) 25 MG tablet TAKE 1 TABLET TWICE DAILY AS NEEDED FOR DIZZINESS 180 tablet 3   metFORMIN  (GLUCOPHAGE -XR) 500 MG 24 hr tablet Take 500 mg by mouth daily.     nitroGLYCERIN  (NITROSTAT ) 0.4 MG SL tablet Place 1 tablet (0.4 mg total) under the tongue every 5 (five) minutes as needed for chest pain. 25 tablet 3   oxyCODONE -acetaminophen  (PERCOCET) 10-325 MG tablet Take 1 tablet by mouth See admin instructions. Every 3-4 hours     pantoprazole  (PROTONIX ) 40 MG tablet TAKE 1 TABLET EVERY DAY 90 tablet 3   sacubitril -valsartan  (ENTRESTO ) 24-26 MG Take 1 tablet by mouth 2 (two) times daily. 180 tablet 3   vitamin B-12 (CYANOCOBALAMIN ) 1000 MCG tablet Take 1,000 mcg by mouth  daily.     VITAMIN D -1000 MAX ST 25 MCG (1000 UT) tablet Take 1,000 Units by mouth daily.     No current facility-administered medications for this visit.    Review of Systems - Oncology PHYSICAL EXAMINATION:  Vitals:   01/07/25 1355 01/07/25 1414  BP: (!) 146/65 (!) 151/70  Pulse: 68   Resp: 18   Temp: (!) 96.8 F (36 C)   SpO2: 99%    Filed Weights   01/07/25 1355  Weight: 199 lb 12.8 oz (90.6 kg)    Physical Exam  LABORATORY DATA:  I have reviewed the data as listed    Latest Ref Rng & Units 01/07/2025    2:51 PM 07/03/2024    5:46 AM 07/02/2024   10:48 AM  CBC  WBC 4.0 - 10.5 K/uL 5.3  5.9  8.2   Hemoglobin 13.0 - 17.0 g/dL 88.7  89.9  88.7   Hematocrit 39.0 - 52.0 % 35.2  31.0  34.7   Platelets 150 - 400 K/uL 154  188  228       Latest Ref Rng & Units 01/07/2025    2:51  PM 07/03/2024    5:46 AM 07/02/2024   10:48 AM  CMP  Glucose 70 - 99 mg/dL 835  878  841   BUN 8 - 23 mg/dL 13  12  16    Creatinine 0.61 - 1.24 mg/dL 8.22  8.87  8.53   Sodium 135 - 145 mmol/L 142  139  139   Potassium 3.5 - 5.1 mmol/L 5.0  3.8  4.1   Chloride 98 - 111 mmol/L 106  109  105   CO2 22 - 32 mmol/L 29  23  25    Calcium  8.9 - 10.3 mg/dL 9.0  8.2  8.5   Total Protein 6.5 - 8.1 g/dL 7.0     Total Bilirubin 0.0 - 1.2 mg/dL 0.6     Alkaline Phos 38 - 126 U/L 104     AST 15 - 41 U/L 16     ALT 0 - 44 U/L 7         RADIOGRAPHIC STUDIES: I have personally reviewed the radiological images as listed and agreed with the findings in the report. CT CHEST WO CONTRAST Result Date: 12/02/2024 EXAM: CT CHEST WITHOUT CONTRAST 12/02/2024 07:59:08 AM TECHNIQUE: CT of the chest was performed without the administration of intravenous contrast. Multiplanar reformatted images are provided for review. Automated exposure control, iterative reconstruction, and/or weight based adjustment of the mA/kV was utilized to reduce the radiation dose to as low as reasonably achievable. COMPARISON: CT of the chest dated 05/22/2024. CLINICAL HISTORY: FINDINGS: MEDIASTINUM: Extensive calcific coronary artery disease present. Status post CABG. Pericardium is unremarkable. The central airways are clear. The thoracic aorta demonstrates moderate calcific atheromatous disease. LYMPH NODES: No mediastinal, hilar or axillary lymphadenopathy. LUNGS AND PLEURA: Wedge-shaped peripheral consolidation/scarring again demonstrated within the base of the lingula. A few small peripheral nodular opacities again demonstrated within the right middle lobe and both lower lobes, similar to the prior study. There is a 10 mm nodular opacity in the ventral periphery of the right lower lobe on image 118 of series 4, similar to the prior study. No pulmonary edema. No pleural effusion or pneumothorax. SOFT TISSUES/BONES: No acute abnormality of the  bones or soft tissues. UPPER ABDOMEN: Limited images of the upper abdomen demonstrates no acute abnormality. IMPRESSION: 1. Wedge-shaped peripheral consolidation/scarring in the base of the lingula, similar to the prior study.  Few small peripheral nodular opacities in the right middle lobe and both lower lobes, similar to the prior study. Findings likely represent the sequela of an atypical infectious or inflammatory process such as mycobacterial or fungal pneumonia. 2. 10 mm solid pulmonary nodule in the ventral periphery of the right lower lobe, similar to the prior study. For a single solid nodule 8.1-20 mm, consider one or multiple of the following as per Fleischner Society Guidelines: non-contrast chest CT at 3 months, PET/CT, or tissue sampling. 3. Extensive calcific coronary artery disease and moderate calcific atheromatous disease of the thoracic aorta. Status post CABG. Electronically signed by: Evalene Coho MD 12/02/2024 08:14 AM EST RP Workstation: [id number]   ECHOCARDIOGRAM LIMITED Result Date: 10/14/2024    ECHOCARDIOGRAM LIMITED REPORT   Patient Name:   Kenneth Duarte Date of Exam: 10/14/2024 Medical Rec #:  [id number]         Height:       71.0 in Accession #:    [id number]        Weight:       181.4 lb Date of Birth:  11/24/1946         BSA:          2.023 m Patient Age:    78 years          BP:           180/69 mmHg Patient Gender: M                 HR:           58 bpm. Exam Location:  [facility] Procedure: 2D Echo, Color Doppler and Cardiac Doppler (Both Spectral and Color            Flow Doppler were utilized during procedure). Indications:    I25.110 Atherosclerotic heart disease of native coronary artery                 with unstable angina pectoris; I10 Hypertension; R07.9* Chest                 pain, unspecified  History:        Patient has prior history of Echocardiogram examinations.  Sonographer:    Marshall Benders Referring Phys: [id number] BERNARDINO CHRISTELLA BRING  Sonographer Comments:  Technically difficult study due to poor echo windows. Image acquisition challenging due to respiratory motion. IMPRESSIONS  1. Left ventricular ejection fraction, by estimation, is 50 to 55%. The left ventricle has low normal function. The left ventricle has no regional wall motion abnormalities. Left ventricular diastolic parameters are consistent with Grade I diastolic dysfunction (impaired relaxation).  2. Right ventricular systolic function is mildly reduced. The right ventricular size is normal. Tricuspid regurgitation signal is inadequate for assessing PA pressure.  3. The mitral valve is normal in structure. No evidence of mitral valve regurgitation. No evidence of mitral stenosis.  4. The aortic valve is normal in structure. Aortic valve regurgitation is not visualized. No aortic stenosis is present.  5. The inferior vena cava is normal in size with greater than 50% respiratory variability, suggesting right atrial pressure of 3 mmHg. FINDINGS  Left Ventricle: Left ventricular ejection fraction, by estimation, is 50 to 55%. The left ventricle has low normal function. The left ventricle has no regional wall motion abnormalities. The left ventricular internal cavity size was normal in size. There is no left ventricular hypertrophy. Left ventricular diastolic parameters are consistent with Grade I diastolic dysfunction (impaired relaxation). Right Ventricle: The right  ventricular size is normal. No increase in right ventricular wall thickness. Right ventricular systolic function is mildly reduced. Tricuspid regurgitation signal is inadequate for assessing PA pressure. Left Atrium: Left atrial size was normal in size. Right Atrium: Right atrial size was normal in size. Pericardium: There is no evidence of pericardial effusion. Mitral Valve: The mitral valve is normal in structure. No evidence of mitral valve stenosis. Tricuspid Valve: The tricuspid valve is normal in structure. Tricuspid valve regurgitation is  not demonstrated. No evidence of tricuspid stenosis. Aortic Valve: The aortic valve is normal in structure. Aortic valve regurgitation is not visualized. No aortic stenosis is present. Aortic valve mean gradient measures 2.0 mmHg. Aortic valve peak gradient measures 4.6 mmHg. Aortic valve area, by VTI measures 2.79 cm. Pulmonic Valve: The pulmonic valve was normal in structure. Pulmonic valve regurgitation is not visualized. No evidence of pulmonic stenosis. Aorta: The aortic root is normal in size and structure. Venous: The inferior vena cava is normal in size with greater than 50% respiratory variability, suggesting right atrial pressure of 3 mmHg. IAS/Shunts: No atrial level shunt detected by color flow Doppler. LEFT VENTRICLE PLAX 2D LVIDd:         5.60 cm   Diastology LVIDs:         4.50 cm   LV e' medial:    6.31 cm/s LV PW:         1.10 cm   LV E/e' medial:  9.1 LV IVS:        0.90 cm   LV e' lateral:   11.00 cm/s LVOT diam:     2.10 cm   LV E/e' lateral: 5.2 LV SV:         67 LV SV Index:   33 LVOT Area:     3.46 cm  RIGHT VENTRICLE             IVC RV Basal diam:  4.10 cm     IVC diam: 1.30 cm RV Mid diam:    3.60 cm RV S prime:     10.40 cm/s LEFT ATRIUM             Index        RIGHT ATRIUM           Index LA diam:        4.00 cm 1.98 cm/m   RA Area:     19.50 cm LA Vol (A2C):   50.7 ml 25.06 ml/m  RA Volume:   49.40 ml  24.42 ml/m LA Vol (A4C):   45.5 ml 22.49 ml/m LA Biplane Vol: 48.3 ml 23.88 ml/m  AORTIC VALVE AV Area (Vmax):    2.71 cm AV Area (Vmean):   2.68 cm AV Area (VTI):     2.79 cm AV Vmax:           107.00 cm/s AV Vmean:          69.900 cm/s AV VTI:            0.240 m AV Peak Grad:      4.6 mmHg AV Mean Grad:      2.0 mmHg LVOT Vmax:         83.80 cm/s LVOT Vmean:        54.000 cm/s LVOT VTI:          0.193 m LVOT/AV VTI ratio: 0.80  AORTA Ao Root diam: 3.10 cm Ao Asc diam:  2.80 cm MITRAL VALVE MV Area (PHT): 2.73 cm  SHUNTS MV Decel Time: 278 msec    Systemic VTI:  0.19 m MV E  velocity: 57.60 cm/s  Systemic Diam: 2.10 cm MV A velocity: 70.20 cm/s MV E/A ratio:  0.82 Evalene Lunger MD Electronically signed by Evalene Lunger MD Signature Date/Time: 10/14/2024/7:33:19 PM    Final          "

## 2025-01-07 NOTE — Assessment & Plan Note (Signed)
 Encourage oral hydration and avoid nephrotoxins.  10/2019 myeloma panel showed negative M protein. Increase kappa and lamda light chain with normal ratio.  Will repeat protein electrophoresis and light chain.

## 2025-01-07 NOTE — Assessment & Plan Note (Addendum)
 Likely due to anemia from CKD.  Normal folate and b12 level, done at PCP clinic.  Lab Results  Component Value Date   HGB 11.2 (L) 01/07/2025   TIBC 343 01/07/2025   IRONPCTSAT 21 01/07/2025   FERRITIN 30 01/07/2025    Recommend venofer  200mg  x 2 to further improve iron  store.

## 2025-01-08 NOTE — Telephone Encounter (Signed)
 Spoke to [facility] regarding lab results, MD recommendations, and follow up plan. Pt transferred to [facility] for scheduling.   IV Venofer  x 2. 3 months, lab prior to MD +/- Venofer 

## 2025-01-08 NOTE — Telephone Encounter (Signed)
-----   Message from Zelphia Cap, MD sent at 01/07/2025  9:13 PM EST ----- Please let patient know that his iron  level is within normal limits.  However looks like he has chronic kidney insufficiency.  In this setting, I recommend patient to get IV Venofer  to further  improve iron  stores and this may help his hemoglobin level.  Please arrange IV Venofer  x 2. Please arrange follow-up in 3 months, lab prior to MD +/- Venofer .  Labs ordered.  Thank you

## 2025-01-09 ENCOUNTER — Inpatient Hospital Stay

## 2025-01-09 VITALS — BP 143/56 | HR 59 | Temp 95.2°F | Resp 16

## 2025-01-09 DIAGNOSIS — D649 Anemia, unspecified: Secondary | ICD-10-CM | POA: Diagnosis not present

## 2025-01-09 DIAGNOSIS — D509 Iron deficiency anemia, unspecified: Secondary | ICD-10-CM

## 2025-01-09 MED ORDER — IRON SUCROSE 20 MG/ML IV SOLN
200.0000 mg | Freq: Once | INTRAVENOUS | Status: AC
  Administered 2025-01-09: 200 mg via INTRAVENOUS
  Filled 2025-01-09: qty 10

## 2025-01-09 NOTE — Patient Instructions (Signed)

## 2025-01-16 ENCOUNTER — Inpatient Hospital Stay

## 2025-01-16 VITALS — BP 132/50 | HR 66 | Temp 96.5°F | Resp 17

## 2025-01-16 DIAGNOSIS — D509 Iron deficiency anemia, unspecified: Secondary | ICD-10-CM

## 2025-01-16 DIAGNOSIS — D649 Anemia, unspecified: Secondary | ICD-10-CM | POA: Diagnosis not present

## 2025-01-16 MED ORDER — IRON SUCROSE 20 MG/ML IV SOLN
200.0000 mg | Freq: Once | INTRAVENOUS | Status: AC
  Administered 2025-01-16: 200 mg via INTRAVENOUS
  Filled 2025-01-16: qty 10

## 2025-01-16 MED ORDER — SODIUM CHLORIDE 0.9% FLUSH
10.0000 mL | Freq: Once | INTRAVENOUS | Status: AC | PRN
  Administered 2025-01-16: 10 mL
  Filled 2025-01-16: qty 10

## 2025-01-16 NOTE — Progress Notes (Signed)
 Patient tolerated Venofer  infusion well. Explained recommendation of 30 min post monitoring. Patient refused to wait post monitoring. Educated on what signs to watch for & to call with any concerns. No questions, discharged. Stable

## 2025-01-16 NOTE — Patient Instructions (Signed)
 Iron  Sucrose Injection What is this medication? IRON  SUCROSE (EYE ern SOO krose) treats low levels of iron  (iron  deficiency anemia) in people with kidney disease. Iron  is a mineral that plays an important role in making red blood cells, which carry oxygen from your lungs to the rest of your body. This medicine may be used for other purposes; ask your health care provider or pharmacist if you have questions. COMMON BRAND NAME(S): Venofer  What should I tell my care team before I take this medication? They need to know if you have any of these conditions: Anemia not caused by low iron  levels Heart disease High levels of iron  in the blood Kidney disease Liver disease An unusual or allergic reaction to iron , other medications, foods, dyes, or preservatives Pregnant or trying to get pregnant Breastfeeding How should I use this medication? This medication is infused into a vein. It is given by your care team in a hospital or clinic setting. Talk to your care team about the use of this medication in children. While it may be prescribed for children as young as 2 years for selected conditions, precautions do apply. Overdosage: If you think you have taken too much of this medicine contact a poison control center or emergency room at once. NOTE: This medicine is only for you. Do not share this medicine with others. What if I miss a dose? Keep appointments for follow-up doses. It is important not to miss your dose. Call your care team if you are unable to keep an appointment. What may interact with this medication? Do not take this medication with any of the following: Deferoxamine Dimercaprol Other iron  products This medication may also interact with the following: Chloramphenicol Deferasirox This list may not describe all possible interactions. Give your health care provider a list of all the medicines, herbs, non-prescription drugs, or dietary supplements you use. Also tell them if you smoke,  drink alcohol, or use illegal drugs. Some items may interact with your medicine. What should I watch for while using this medication? Your condition will be monitored carefully while you are receiving this medication. Tell your care team if your symptoms do not start to get better or if they get worse. You may need blood work done while you are taking this medication. Sometimes, when medications are infused into veins, a little can leak out of the vein and into the tissue around it. If this medication leaks, it can cause a brown or dark stain on the skin. This is not common. It may be permanent. If you feel pain or swelling during your infusion, tell your care team right away. They can stop the infusion and treat the area. You may need to eat more foods that contain iron . Talk to your care team. Foods that contain iron  include whole grains or cereals, dried fruits, beans, peas, leafy green vegetables, and organ meats (liver, kidney). What side effects may I notice from receiving this medication? Side effects that you should report to your care team as soon as possible: Allergic reactions--skin rash, itching, hives, swelling of the face, lips, tongue, or throat Low blood pressure--dizziness, feeling faint or lightheaded, blurry vision Painful swelling, warmth, or redness of the skin, brown or dark skin color at the infusion site Shortness of breath Side effects that usually do not require medical attention (report these to your care team if they continue or are bothersome): Flushing Headache Joint pain Muscle pain Nausea This list may not describe all possible side effects. Call your  doctor for medical advice about side effects. You may report side effects to FDA at 1-800-FDA-1088. Where should I keep my medication? This medication is given in a hospital or clinic. It will not be stored at home. NOTE: This sheet is a summary. It may not cover all possible information. If you have questions about  this medicine, talk to your doctor, pharmacist, or health care provider.  2025 [employer] (2024-10-22 00:00:00)

## 2025-02-13 ENCOUNTER — Ambulatory Visit (INDEPENDENT_AMBULATORY_CARE_PROVIDER_SITE_OTHER): Admitting: Nurse Practitioner

## 2025-02-13 ENCOUNTER — Encounter (INDEPENDENT_AMBULATORY_CARE_PROVIDER_SITE_OTHER)

## 2025-04-08 ENCOUNTER — Inpatient Hospital Stay

## 2025-04-16 ENCOUNTER — Inpatient Hospital Stay: Admitting: Oncology

## 2025-04-16 ENCOUNTER — Inpatient Hospital Stay
